# Patient Record
Sex: Female | Born: 1940 | Race: Black or African American | Hispanic: No | Marital: Married | Smoking: Never smoker
Health system: Southern US, Community
[De-identification: ages and names within clinical notes are randomized; demographics above are authoritative.]

## PROBLEM LIST (undated history)

## (undated) DIAGNOSIS — J40 Bronchitis, not specified as acute or chronic: Secondary | ICD-10-CM

## (undated) DIAGNOSIS — M24662 Ankylosis, left knee: Secondary | ICD-10-CM

## (undated) DIAGNOSIS — M1990 Unspecified osteoarthritis, unspecified site: Secondary | ICD-10-CM

## (undated) DIAGNOSIS — K219 Gastro-esophageal reflux disease without esophagitis: Secondary | ICD-10-CM

## (undated) DIAGNOSIS — R079 Chest pain, unspecified: Secondary | ICD-10-CM

## (undated) DIAGNOSIS — R42 Dizziness and giddiness: Secondary | ICD-10-CM

## (undated) DIAGNOSIS — D649 Anemia, unspecified: Secondary | ICD-10-CM

## (undated) DIAGNOSIS — Z8542 Personal history of malignant neoplasm of other parts of uterus: Secondary | ICD-10-CM

## (undated) DIAGNOSIS — I2510 Atherosclerotic heart disease of native coronary artery without angina pectoris: Secondary | ICD-10-CM

## (undated) DIAGNOSIS — I4891 Unspecified atrial fibrillation: Secondary | ICD-10-CM

## (undated) DIAGNOSIS — M797 Fibromyalgia: Secondary | ICD-10-CM

## (undated) DIAGNOSIS — E559 Vitamin D deficiency, unspecified: Secondary | ICD-10-CM

## (undated) DIAGNOSIS — H9190 Unspecified hearing loss, unspecified ear: Secondary | ICD-10-CM

## (undated) DIAGNOSIS — R001 Bradycardia, unspecified: Secondary | ICD-10-CM

## (undated) DIAGNOSIS — R200 Anesthesia of skin: Secondary | ICD-10-CM

## (undated) DIAGNOSIS — I10 Essential (primary) hypertension: Secondary | ICD-10-CM

## (undated) DIAGNOSIS — E785 Hyperlipidemia, unspecified: Secondary | ICD-10-CM

## (undated) DIAGNOSIS — J189 Pneumonia, unspecified organism: Secondary | ICD-10-CM

## (undated) DIAGNOSIS — R55 Syncope and collapse: Secondary | ICD-10-CM

## (undated) DIAGNOSIS — R32 Unspecified urinary incontinence: Secondary | ICD-10-CM

## (undated) DIAGNOSIS — R202 Paresthesia of skin: Secondary | ICD-10-CM

## (undated) DIAGNOSIS — N3941 Urge incontinence: Secondary | ICD-10-CM

## (undated) DIAGNOSIS — Z8719 Personal history of other diseases of the digestive system: Secondary | ICD-10-CM

## (undated) DIAGNOSIS — H9319 Tinnitus, unspecified ear: Secondary | ICD-10-CM

## (undated) DIAGNOSIS — IMO0001 Reserved for inherently not codable concepts without codable children: Secondary | ICD-10-CM

## (undated) HISTORY — PX: OTHER SURGICAL HISTORY: SHX169

## (undated) HISTORY — DX: Unspecified atrial fibrillation: I48.91

## (undated) HISTORY — DX: Atherosclerotic heart disease of native coronary artery without angina pectoris: I25.10

## (undated) HISTORY — PX: TYMPANOPLASTY: SHX33

## (undated) HISTORY — DX: Hyperlipidemia, unspecified: E78.5

---

## 1970-12-02 HISTORY — PX: TOTAL ABDOMINAL HYSTERECTOMY W/ BILATERAL SALPINGOOPHORECTOMY: SHX83

## 1973-12-02 DIAGNOSIS — Z8542 Personal history of malignant neoplasm of other parts of uterus: Secondary | ICD-10-CM

## 1973-12-02 HISTORY — DX: Personal history of malignant neoplasm of other parts of uterus: Z85.42

## 1998-08-23 ENCOUNTER — Ambulatory Visit (HOSPITAL_COMMUNITY): Admission: RE | Admit: 1998-08-23 | Discharge: 1998-08-23 | Payer: Self-pay | Admitting: Family Medicine

## 1999-05-08 ENCOUNTER — Other Ambulatory Visit: Admission: RE | Admit: 1999-05-08 | Discharge: 1999-05-08 | Payer: Self-pay | Admitting: Family Medicine

## 1999-08-14 ENCOUNTER — Ambulatory Visit (HOSPITAL_BASED_OUTPATIENT_CLINIC_OR_DEPARTMENT_OTHER): Admission: RE | Admit: 1999-08-14 | Discharge: 1999-08-14 | Payer: Self-pay | Admitting: Otolaryngology

## 1999-08-27 ENCOUNTER — Ambulatory Visit (HOSPITAL_COMMUNITY): Admission: RE | Admit: 1999-08-27 | Discharge: 1999-08-27 | Payer: Self-pay | Admitting: Family Medicine

## 1999-08-27 ENCOUNTER — Encounter: Payer: Self-pay | Admitting: Family Medicine

## 2000-04-10 ENCOUNTER — Other Ambulatory Visit: Admission: RE | Admit: 2000-04-10 | Discharge: 2000-04-10 | Payer: Self-pay | Admitting: Family Medicine

## 2000-08-26 ENCOUNTER — Ambulatory Visit (HOSPITAL_COMMUNITY): Admission: RE | Admit: 2000-08-26 | Discharge: 2000-08-26 | Payer: Self-pay | Admitting: Gastroenterology

## 2000-09-02 ENCOUNTER — Ambulatory Visit (HOSPITAL_COMMUNITY): Admission: RE | Admit: 2000-09-02 | Discharge: 2000-09-02 | Payer: Self-pay | Admitting: Gastroenterology

## 2000-09-02 ENCOUNTER — Encounter: Payer: Self-pay | Admitting: Gastroenterology

## 2000-09-16 ENCOUNTER — Ambulatory Visit (HOSPITAL_COMMUNITY): Admission: RE | Admit: 2000-09-16 | Discharge: 2000-09-16 | Payer: Self-pay | Admitting: Gastroenterology

## 2000-09-16 ENCOUNTER — Encounter: Payer: Self-pay | Admitting: Gastroenterology

## 2003-10-26 ENCOUNTER — Encounter: Payer: Self-pay | Admitting: Cardiology

## 2003-10-26 ENCOUNTER — Inpatient Hospital Stay (HOSPITAL_COMMUNITY): Admission: EM | Admit: 2003-10-26 | Discharge: 2003-10-27 | Payer: Self-pay | Admitting: Family Medicine

## 2004-11-17 ENCOUNTER — Emergency Department (HOSPITAL_COMMUNITY): Admission: EM | Admit: 2004-11-17 | Discharge: 2004-11-17 | Payer: Self-pay | Admitting: Emergency Medicine

## 2005-08-02 ENCOUNTER — Emergency Department (HOSPITAL_COMMUNITY): Admission: EM | Admit: 2005-08-02 | Discharge: 2005-08-02 | Payer: Self-pay | Admitting: Emergency Medicine

## 2005-08-28 ENCOUNTER — Emergency Department (HOSPITAL_COMMUNITY): Admission: EM | Admit: 2005-08-28 | Discharge: 2005-08-28 | Payer: Self-pay | Admitting: Emergency Medicine

## 2005-10-09 ENCOUNTER — Emergency Department (HOSPITAL_COMMUNITY): Admission: EM | Admit: 2005-10-09 | Discharge: 2005-10-09 | Payer: Self-pay | Admitting: Emergency Medicine

## 2005-12-12 ENCOUNTER — Inpatient Hospital Stay (HOSPITAL_COMMUNITY): Admission: RE | Admit: 2005-12-12 | Discharge: 2005-12-15 | Payer: Self-pay | Admitting: Neurosurgery

## 2006-03-15 ENCOUNTER — Emergency Department (HOSPITAL_COMMUNITY): Admission: EM | Admit: 2006-03-15 | Discharge: 2006-03-16 | Payer: Self-pay | Admitting: Emergency Medicine

## 2006-05-02 ENCOUNTER — Ambulatory Visit (HOSPITAL_COMMUNITY): Admission: RE | Admit: 2006-05-02 | Discharge: 2006-05-02 | Payer: Self-pay | Admitting: Family Medicine

## 2006-06-03 ENCOUNTER — Emergency Department (HOSPITAL_COMMUNITY): Admission: EM | Admit: 2006-06-03 | Discharge: 2006-06-03 | Payer: Self-pay | Admitting: Family Medicine

## 2007-02-13 ENCOUNTER — Emergency Department (HOSPITAL_COMMUNITY): Admission: EM | Admit: 2007-02-13 | Discharge: 2007-02-14 | Payer: Self-pay | Admitting: Emergency Medicine

## 2007-03-09 ENCOUNTER — Encounter: Admission: RE | Admit: 2007-03-09 | Discharge: 2007-03-25 | Payer: Self-pay | Admitting: Family Medicine

## 2009-06-20 ENCOUNTER — Other Ambulatory Visit: Admission: RE | Admit: 2009-06-20 | Discharge: 2009-06-20 | Payer: Self-pay | Admitting: Family Medicine

## 2009-12-02 HISTORY — PX: CERVICAL FUSION: SHX112

## 2010-12-22 ENCOUNTER — Encounter: Payer: Self-pay | Admitting: Family Medicine

## 2011-03-03 DEATH — deceased

## 2012-08-11 ENCOUNTER — Inpatient Hospital Stay (HOSPITAL_COMMUNITY): Admission: RE | Admit: 2012-08-11 | Payer: Medicare Other | Source: Ambulatory Visit | Admitting: Orthopedic Surgery

## 2012-08-11 ENCOUNTER — Encounter (HOSPITAL_COMMUNITY): Admission: RE | Payer: Self-pay | Source: Ambulatory Visit

## 2012-08-11 SURGERY — ARTHROPLASTY, KNEE, TOTAL
Anesthesia: Spinal | Laterality: Right

## 2012-12-21 ENCOUNTER — Other Ambulatory Visit (HOSPITAL_COMMUNITY): Payer: Self-pay | Admitting: Family Medicine

## 2012-12-21 DIAGNOSIS — Z1231 Encounter for screening mammogram for malignant neoplasm of breast: Secondary | ICD-10-CM

## 2013-01-22 ENCOUNTER — Ambulatory Visit (HOSPITAL_COMMUNITY)
Admission: RE | Admit: 2013-01-22 | Discharge: 2013-01-22 | Disposition: A | Discharge status: Home / Self Care | Payer: Medicare Other | Source: Ambulatory Visit | Attending: Family Medicine | Admitting: Family Medicine

## 2013-01-22 DIAGNOSIS — Z1231 Encounter for screening mammogram for malignant neoplasm of breast: Secondary | ICD-10-CM

## 2013-01-27 ENCOUNTER — Other Ambulatory Visit: Payer: Self-pay | Admitting: Family Medicine

## 2013-01-27 DIAGNOSIS — R1013 Epigastric pain: Secondary | ICD-10-CM

## 2013-01-27 DIAGNOSIS — R1310 Dysphagia, unspecified: Secondary | ICD-10-CM

## 2013-02-05 ENCOUNTER — Other Ambulatory Visit: Payer: Medicare Other

## 2013-07-14 NOTE — Progress Notes (Signed)
Need orders in EPIC. Surgery scheduled for 08/03/13.  Thank You.  

## 2013-07-20 ENCOUNTER — Encounter (HOSPITAL_COMMUNITY): Payer: Self-pay

## 2013-07-20 ENCOUNTER — Other Ambulatory Visit (HOSPITAL_COMMUNITY): Payer: Self-pay | Admitting: Orthopedic Surgery

## 2013-07-20 ENCOUNTER — Encounter (HOSPITAL_COMMUNITY): Payer: Self-pay | Admitting: Pharmacy Technician

## 2013-07-20 NOTE — Patient Instructions (Addendum)
20 Dezire L Atha  07/20/2013   Your procedure is scheduled on:  08/03/13  TUESDAY  Report to [facility] at   1235PM  Call this number if you have problems the morning of surgery: [phone]       Remember:   Do not eat food  :After Midnight.  Monday NIGHT--- MAY HAVE CLEAR LIQUIDS Tuesday MORNING UNTIL  0930AM, THEN NOTHING   Take these medicines the morning of surgery with A SIP OF WATER: AMLODIPINE,  ATENOLOL, OMEPRAZOLE   .  Contacts, dentures or partial plates can not be worn to surgery  Leave suitcase in the car. After surgery it may be brought to your room.  For patients admitted to the hospital, checkout time is 11:00 AM day of  discharge.             SPECIAL INSTRUCTIONS- SEE [place] PREPARING FOR SURGERY INSTRUCTION SHEET-     DO NOT WEAR JEWELRY, LOTIONS, POWDERS, OR PERFUMES.  WOMEN-- DO NOT SHAVE LEGS OR UNDERARMS FOR 12 HOURS BEFORE SHOWERS. MEN MAY SHAVE FACE.  Patients discharged the day of surgery will not be allowed to drive home. IF going home the day of surgery, you must have a driver and someone to stay with you for the first 24 hours  Name and phone number of your driver:  ADMISSION                                                                      Please read over the following fact sheets that you were given: MRSA Information, Incentive Spirometry Sheet, Blood Transfusion Sheet  Information                                                                                   Russell Quinney  PST [phone]  [phone]                 FAILURE TO FOLLOW THESE INSTRUCTIONS MAY RESULT IN  CANCELLATION   OF YOUR SURGERY                                                  Patient Signature _____________________________

## 2013-07-20 NOTE — Progress Notes (Signed)
EKG chart 8/14,OV with clearance Dr Katrinka Blazing chart

## 2013-07-21 ENCOUNTER — Encounter (HOSPITAL_COMMUNITY)
Admission: RE | Admit: 2013-07-21 | Discharge: 2013-07-21 | Disposition: A | Discharge status: Home / Self Care | Payer: Medicare Other | Source: Ambulatory Visit | Attending: Orthopedic Surgery | Admitting: Orthopedic Surgery

## 2013-07-21 ENCOUNTER — Ambulatory Visit (HOSPITAL_COMMUNITY)
Admission: RE | Admit: 2013-07-21 | Discharge: 2013-07-21 | Disposition: A | Discharge status: Home / Self Care | Payer: Medicare Other | Source: Ambulatory Visit | Attending: Orthopedic Surgery | Admitting: Orthopedic Surgery

## 2013-07-21 ENCOUNTER — Encounter (HOSPITAL_COMMUNITY): Payer: Self-pay

## 2013-07-21 DIAGNOSIS — Z01818 Encounter for other preprocedural examination: Secondary | ICD-10-CM | POA: Insufficient documentation

## 2013-07-21 DIAGNOSIS — Z01812 Encounter for preprocedural laboratory examination: Secondary | ICD-10-CM | POA: Insufficient documentation

## 2013-07-21 HISTORY — DX: Personal history of other diseases of the digestive system: Z87.19

## 2013-07-21 HISTORY — DX: Essential (primary) hypertension: I10

## 2013-07-21 HISTORY — DX: Unspecified osteoarthritis, unspecified site: M19.90

## 2013-07-21 HISTORY — DX: Gastro-esophageal reflux disease without esophagitis: K21.9

## 2013-07-21 HISTORY — DX: Pneumonia, unspecified organism: J18.9

## 2013-07-21 HISTORY — DX: Fibromyalgia: M79.7

## 2013-07-21 HISTORY — DX: Bradycardia, unspecified: R00.1

## 2013-07-21 LAB — SURGICAL PCR SCREEN
MRSA, PCR: NEGATIVE
Staphylococcus aureus: NEGATIVE

## 2013-07-21 LAB — CBC
HCT: 38.5 % (ref 36.0–46.0)
Hemoglobin: 13.1 g/dL (ref 12.0–15.0)
MCH: 31.3 pg (ref 26.0–34.0)
MCHC: 34 g/dL (ref 30.0–36.0)
MCV: 91.9 fL (ref 78.0–100.0)
Platelets: 278 10*3/uL (ref 150–400)
RBC: 4.19 MIL/uL (ref 3.87–5.11)
RDW: 12.3 % (ref 11.5–15.5)
WBC: 4 10*3/uL (ref 4.0–10.5)

## 2013-07-21 LAB — BASIC METABOLIC PANEL
BUN: 16 mg/dL (ref 6–23)
CO2: 28 mEq/L (ref 19–32)
Calcium: 9.7 mg/dL (ref 8.4–10.5)
Chloride: 101 mEq/L (ref 96–112)
Creatinine, Ser: 0.72 mg/dL (ref 0.50–1.10)
GFR calc Af Amer: >90 mL/min (ref >90)
GFR calc non Af Amer: 84 mL/min — ABNORMAL LOW (ref >90)
Glucose, Bld: 84 mg/dL (ref 70–99)
Potassium: 4.2 mEq/L (ref 3.5–5.1)
Sodium: 136 mEq/L (ref 135–145)

## 2013-07-21 LAB — URINE MICROSCOPIC-ADD ON

## 2013-07-21 LAB — APTT: aPTT: 32 seconds (ref 24–37)

## 2013-07-21 LAB — URINALYSIS, ROUTINE W REFLEX MICROSCOPIC
Bilirubin Urine: NEGATIVE
Glucose, UA: NEGATIVE mg/dL
Hgb urine dipstick: NEGATIVE
Ketones, ur: NEGATIVE mg/dL
Nitrite: NEGATIVE
Protein, ur: NEGATIVE mg/dL
Specific Gravity, Urine: 1.016 (ref 1.005–1.030)
Urobilinogen, UA: 0.2 mg/dL (ref 0.0–1.0)
pH: 5 (ref 5.0–8.0)

## 2013-07-21 LAB — PROTIME-INR
INR: 1.12 (ref 0.00–1.49)
Prothrombin Time: 14.2 seconds (ref 11.6–15.2)

## 2013-07-21 NOTE — Progress Notes (Signed)
Faxed urine with micro to Dr Charlann Boxer through [facility]

## 2013-07-22 LAB — URINE CULTURE
Colony Count: NO GROWTH
Culture: NO GROWTH

## 2013-07-23 NOTE — Progress Notes (Signed)
Received fax from Dr Boneta Lucks office stating Cipro was called in to [facility] on [address]

## 2013-07-23 NOTE — H&P (Signed)
TOTAL KNEE ADMISSION H&P  Patient is being admitted for left total knee arthroplasty.  Subjective:  Chief Complaint:  Left knee OA / pain.  HPI: Phyllis Knight, 72 y.o. female, has a history of pain and functional disability in the left knee due to arthritis and has failed non-surgical conservative treatments for greater than 12 weeks to includeNSAID's and/or analgesics, corticosteriod injections, use of assistive devices and activity modification.  Onset of symptoms was gradual, starting 3+ years ago with gradually worsening course since that time. The patient noted prior procedures on the knee to include  arthroscopy on the left knee(s).  Patient currently rates pain in the left knee(s) at 8 out of 10 with activity. Patient has night pain, worsening of pain with activity and weight bearing, pain that interferes with activities of daily living, pain with passive range of motion, crepitus and joint swelling.  Patient has evidence of periarticular osteophytes and joint space narrowing by imaging studies. There is no active infection.  Risks, benefits and expectations were discussed with the patient. Patient understand the risks, benefits and expectations and wishes to proceed with surgery.   D/C Plans:   Home with HHPT  Post-op Meds:    Rx given for ASA, Zanaflex, Iron, Colace and MiraLax  Tranexamic Acid:   To be given  Decadron:    To be given  FYI:    ASA post-op    Past Medical History  Diagnosis Date  . Arthritis   . Fibromyalgia   . Hypertension   . GERD (gastroesophageal reflux disease)   . H/O hiatal hernia   . Cancer     uterine  . Pneumonia   . Bradycardia   . Anxiety     regarding surgery    Past Surgical History  Procedure Laterality Date  . Abdominal hysterectomy    . Tracheotomy  1960  . Tympanoplasty Left     Allergies  Allergen Reactions  . Codeine Nausea And Vomiting    History  Substance Use Topics  . Smoking status: Never Smoker   . Smokeless  tobacco: Never Used  . Alcohol Use: No      Review of Systems  Constitutional: Positive for chills.  HENT: Positive for tinnitus.   Eyes: Negative.   Respiratory: Positive for cough and wheezing.   Cardiovascular: Negative.   Gastrointestinal: Positive for nausea, abdominal pain and constipation.  Genitourinary: Negative.   Musculoskeletal: Positive for back pain and joint pain.  Skin: Negative.   Neurological: Positive for headaches.  Endo/Heme/Allergies: Negative.   Psychiatric/Behavioral: The patient is nervous/anxious.     Objective:  Physical Exam  Constitutional: She is oriented to person, place, and time. She appears well-developed and well-nourished.  HENT:  Head: Normocephalic and atraumatic.  Mouth/Throat: Oropharynx is clear and moist.  Eyes: Pupils are equal, round, and reactive to light.  Neck: Neck supple. No JVD present. No tracheal deviation present. No thyromegaly present.  Cardiovascular: Normal rate, regular rhythm, normal heart sounds and intact distal pulses.   Respiratory: Effort normal and breath sounds normal. No stridor. No respiratory distress. She has no wheezes.  GI: Soft. There is no tenderness. There is no guarding.  Musculoskeletal:       Left knee: She exhibits decreased range of motion, swelling, abnormal alignment (valgus) and bony tenderness. She exhibits no effusion, no ecchymosis and no deformity. Tenderness found.  Lymphadenopathy:    She has no cervical adenopathy.  Neurological: She is alert and oriented to person, place, and time.  A sensory deficit (neuropathy in both feet at times.) is present.  Skin: Skin is warm and dry.  Psychiatric: She has a normal mood and affect.     Imaging Review Plain radiographs demonstrate severe degenerative joint disease of the left knee(s). The overall alignment is  mild valgus. The bone quality appears to be good for age and reported activity level.  Assessment/Plan:  End stage arthritis, left  knee   The patient history, physical examination, clinical judgment of the provider and imaging studies are consistent with end stage degenerative joint disease of the left knee(s) and total knee arthroplasty is deemed medically necessary. The treatment options including medical management, injection therapy arthroscopy and arthroplasty were discussed at length. The risks and benefits of total knee arthroplasty were presented and reviewed. The risks due to aseptic loosening, infection, stiffness, patella tracking problems, thromboembolic complications and other imponderables were discussed. The patient acknowledged the explanation, agreed to proceed with the plan and consent was signed. Patient is being admitted for inpatient treatment for surgery, pain control, PT, OT, prophylactic antibiotics, VTE prophylaxis, progressive ambulation and ADL's and discharge planning. The patient is planning to be discharged home with home health services.    Anastasio Auerbach Mikiah Demond   PAC  07/23/2013, 11:45 AM

## 2013-07-30 NOTE — Progress Notes (Signed)
Notified patient and husband  of time change- to arrive Short Stay  Tuesday at 1215 PM-  May have clear liquids Tues am until 0815 AM then nothing by mouth

## 2013-08-03 ENCOUNTER — Encounter (HOSPITAL_COMMUNITY): Payer: Self-pay | Admitting: *Deleted

## 2013-08-03 ENCOUNTER — Inpatient Hospital Stay (HOSPITAL_COMMUNITY)
Admission: RE | Admit: 2013-08-03 | Discharge: 2013-08-05 | DRG: 470 | Disposition: A | Discharge status: Home Health | Payer: Medicare Other | Source: Ambulatory Visit | Attending: Orthopedic Surgery | Admitting: Orthopedic Surgery

## 2013-08-03 ENCOUNTER — Encounter (HOSPITAL_COMMUNITY): Payer: Self-pay | Admitting: Anesthesiology

## 2013-08-03 ENCOUNTER — Encounter (HOSPITAL_COMMUNITY)
Admission: RE | Disposition: A | Discharge status: Home Health | Payer: Self-pay | Source: Ambulatory Visit | Attending: Orthopedic Surgery

## 2013-08-03 ENCOUNTER — Inpatient Hospital Stay (HOSPITAL_COMMUNITY): Payer: Medicare Other | Admitting: Anesthesiology

## 2013-08-03 DIAGNOSIS — M6580 Other synovitis and tenosynovitis, unspecified site: Secondary | ICD-10-CM | POA: Diagnosis present

## 2013-08-03 DIAGNOSIS — M1710 Unilateral primary osteoarthritis, unspecified knee: Principal | ICD-10-CM | POA: Diagnosis present

## 2013-08-03 DIAGNOSIS — K219 Gastro-esophageal reflux disease without esophagitis: Secondary | ICD-10-CM | POA: Diagnosis present

## 2013-08-03 DIAGNOSIS — Z96652 Presence of left artificial knee joint: Secondary | ICD-10-CM

## 2013-08-03 DIAGNOSIS — E669 Obesity, unspecified: Secondary | ICD-10-CM | POA: Diagnosis present

## 2013-08-03 DIAGNOSIS — Z6835 Body mass index (BMI) 35.0-35.9, adult: Secondary | ICD-10-CM

## 2013-08-03 DIAGNOSIS — Z96659 Presence of unspecified artificial knee joint: Secondary | ICD-10-CM

## 2013-08-03 DIAGNOSIS — I10 Essential (primary) hypertension: Secondary | ICD-10-CM | POA: Diagnosis present

## 2013-08-03 DIAGNOSIS — D500 Iron deficiency anemia secondary to blood loss (chronic): Secondary | ICD-10-CM | POA: Diagnosis not present

## 2013-08-03 DIAGNOSIS — D62 Acute posthemorrhagic anemia: Secondary | ICD-10-CM | POA: Diagnosis not present

## 2013-08-03 DIAGNOSIS — IMO0001 Reserved for inherently not codable concepts without codable children: Secondary | ICD-10-CM | POA: Diagnosis present

## 2013-08-03 HISTORY — PX: TOTAL KNEE ARTHROPLASTY: SHX125

## 2013-08-03 LAB — TYPE AND SCREEN
ABO/RH(D): O POS
Antibody Screen: NEGATIVE

## 2013-08-03 LAB — ABO/RH: ABO/RH(D): O POS

## 2013-08-03 SURGERY — ARTHROPLASTY, KNEE, TOTAL
Anesthesia: Spinal | Site: Knee | Laterality: Left | Wound class: Clean

## 2013-08-03 MED ORDER — CEFAZOLIN SODIUM-DEXTROSE 2-3 GM-% IV SOLR
2.0000 g | INTRAVENOUS | Status: AC
  Administered 2013-08-03: 2 g via INTRAVENOUS

## 2013-08-03 MED ORDER — METOCLOPRAMIDE HCL 5 MG/ML IJ SOLN: 5.0000 mg | Freq: Three times a day (TID) | INTRAMUSCULAR | Status: DC | PRN

## 2013-08-03 MED ORDER — KETOROLAC TROMETHAMINE 30 MG/ML IJ SOLN
INTRAMUSCULAR | Status: DC | PRN
  Administered 2013-08-03: 30 mg via INTRAMUSCULAR

## 2013-08-03 MED ORDER — TRAMADOL HCL 50 MG PO TABS
50.0000 mg | ORAL_TABLET | Freq: Four times a day (QID) | ORAL | Status: DC
  Administered 2013-08-03: 50 mg via ORAL
  Administered 2013-08-04: 100 mg via ORAL
  Administered 2013-08-04: 50 mg via ORAL
  Administered 2013-08-04 – 2013-08-05 (×4): 100 mg via ORAL
  Filled 2013-08-03 (×2): qty 2
  Filled 2013-08-03: qty 1
  Filled 2013-08-03 (×2): qty 2
  Filled 2013-08-03: qty 1
  Filled 2013-08-03: qty 2

## 2013-08-03 MED ORDER — CELECOXIB 200 MG PO CAPS
200.0000 mg | ORAL_CAPSULE | Freq: Two times a day (BID) | ORAL | Status: DC
  Administered 2013-08-03 – 2013-08-05 (×4): 200 mg via ORAL
  Filled 2013-08-03 (×5): qty 1

## 2013-08-03 MED ORDER — ASPIRIN EC 325 MG PO TBEC
325.0000 mg | DELAYED_RELEASE_TABLET | Freq: Two times a day (BID) | ORAL | Status: DC
  Administered 2013-08-04 – 2013-08-05 (×3): 325 mg via ORAL
  Filled 2013-08-03 (×5): qty 1

## 2013-08-03 MED ORDER — ZOLPIDEM TARTRATE 5 MG PO TABS: 5.0000 mg | ORAL_TABLET | Freq: Every evening | ORAL | Status: DC | PRN

## 2013-08-03 MED ORDER — HYDROMORPHONE HCL PF 1 MG/ML IJ SOLN
0.5000 mg | INTRAMUSCULAR | Status: DC | PRN
  Administered 2013-08-03 – 2013-08-04 (×3): 1 mg via INTRAVENOUS
  Filled 2013-08-03 (×3): qty 1

## 2013-08-03 MED ORDER — FESOTERODINE FUMARATE ER 8 MG PO TB24
8.0000 mg | ORAL_TABLET | Freq: Every day | ORAL | Status: DC
  Administered 2013-08-04 – 2013-08-05 (×2): 8 mg via ORAL
  Filled 2013-08-03 (×4): qty 1

## 2013-08-03 MED ORDER — DEXAMETHASONE SODIUM PHOSPHATE 10 MG/ML IJ SOLN
10.0000 mg | Freq: Once | INTRAMUSCULAR | Status: AC
  Administered 2013-08-04: 10 mg via INTRAVENOUS
  Filled 2013-08-03: qty 1

## 2013-08-03 MED ORDER — MIDAZOLAM HCL 5 MG/5ML IJ SOLN
INTRAMUSCULAR | Status: DC | PRN
  Administered 2013-08-03: 2 mg via INTRAVENOUS

## 2013-08-03 MED ORDER — AMLODIPINE BESYLATE 10 MG PO TABS
10.0000 mg | ORAL_TABLET | Freq: Every morning | ORAL | Status: DC
  Administered 2013-08-04 – 2013-08-05 (×2): 10 mg via ORAL
  Filled 2013-08-03 (×2): qty 1

## 2013-08-03 MED ORDER — BISACODYL 10 MG RE SUPP: 10.0000 mg | Freq: Every day | RECTAL | Status: DC | PRN

## 2013-08-03 MED ORDER — POLYETHYLENE GLYCOL 3350 17 G PO PACK
17.0000 g | PACK | Freq: Two times a day (BID) | ORAL | Status: DC
  Administered 2013-08-03 – 2013-08-05 (×4): 17 g via ORAL

## 2013-08-03 MED ORDER — MEPERIDINE HCL 50 MG/ML IJ SOLN: 6.2500 mg | INTRAMUSCULAR | Status: DC | PRN

## 2013-08-03 MED ORDER — PROPOFOL INFUSION 10 MG/ML OPTIME
INTRAVENOUS | Status: DC | PRN
  Administered 2013-08-03: 75 ug/kg/min via INTRAVENOUS

## 2013-08-03 MED ORDER — FLEET ENEMA 7-19 GM/118ML RE ENEM: 1.0000 | ENEMA | Freq: Once | RECTAL | Status: AC | PRN

## 2013-08-03 MED ORDER — SODIUM CHLORIDE 0.9 % IV SOLN
INTRAVENOUS | Status: DC
  Administered 2013-08-03: 22:00:00 via INTRAVENOUS
  Filled 2013-08-03 (×6): qty 1000

## 2013-08-03 MED ORDER — PANTOPRAZOLE SODIUM 40 MG PO TBEC
40.0000 mg | DELAYED_RELEASE_TABLET | Freq: Every day | ORAL | Status: DC
  Administered 2013-08-04 – 2013-08-05 (×2): 40 mg via ORAL
  Filled 2013-08-03 (×2): qty 1

## 2013-08-03 MED ORDER — DIPHENHYDRAMINE HCL 25 MG PO CAPS: 25.0000 mg | ORAL_CAPSULE | Freq: Four times a day (QID) | ORAL | Status: DC | PRN

## 2013-08-03 MED ORDER — FENTANYL CITRATE 0.05 MG/ML IJ SOLN
INTRAMUSCULAR | Status: DC | PRN
  Administered 2013-08-03 (×2): 25 ug via INTRAVENOUS

## 2013-08-03 MED ORDER — PHENOL 1.4 % MT LIQD: 1.0000 | OROMUCOSAL | Status: DC | PRN

## 2013-08-03 MED ORDER — TRANEXAMIC ACID 100 MG/ML IV SOLN
1000.0000 mg | Freq: Once | INTRAVENOUS | Status: AC
  Administered 2013-08-03: 1000 mg via INTRAVENOUS
  Filled 2013-08-03: qty 10

## 2013-08-03 MED ORDER — PROMETHAZINE HCL 25 MG/ML IJ SOLN: 6.2500 mg | INTRAMUSCULAR | Status: DC | PRN

## 2013-08-03 MED ORDER — FERROUS SULFATE 325 (65 FE) MG PO TABS
325.0000 mg | ORAL_TABLET | Freq: Three times a day (TID) | ORAL | Status: DC
  Administered 2013-08-04 – 2013-08-05 (×4): 325 mg via ORAL
  Filled 2013-08-03 (×7): qty 1

## 2013-08-03 MED ORDER — KETAMINE HCL 10 MG/ML IJ SOLN
INTRAMUSCULAR | Status: DC | PRN
  Administered 2013-08-03: 10 mg via INTRAVENOUS

## 2013-08-03 MED ORDER — LACTATED RINGERS IV SOLN
INTRAVENOUS | Status: DC
  Administered 2013-08-03: 16:00:00 via INTRAVENOUS
  Administered 2013-08-03: 1000 mL via INTRAVENOUS

## 2013-08-03 MED ORDER — ONDANSETRON HCL 4 MG PO TABS: 4.0000 mg | ORAL_TABLET | Freq: Four times a day (QID) | ORAL | Status: DC | PRN

## 2013-08-03 MED ORDER — MENTHOL 3 MG MT LOZG: 1.0000 | LOZENGE | OROMUCOSAL | Status: DC | PRN

## 2013-08-03 MED ORDER — ALUM & MAG HYDROXIDE-SIMETH 200-200-20 MG/5ML PO SUSP: 30.0000 mL | ORAL | Status: DC | PRN

## 2013-08-03 MED ORDER — METHOCARBAMOL 500 MG PO TABS
500.0000 mg | ORAL_TABLET | Freq: Four times a day (QID) | ORAL | Status: DC | PRN
  Administered 2013-08-04 (×2): 500 mg via ORAL
  Filled 2013-08-03 (×2): qty 1

## 2013-08-03 MED ORDER — ONDANSETRON HCL 4 MG/2ML IJ SOLN
4.0000 mg | Freq: Four times a day (QID) | INTRAMUSCULAR | Status: DC | PRN
  Administered 2013-08-04: 4 mg via INTRAVENOUS
  Filled 2013-08-03: qty 2

## 2013-08-03 MED ORDER — SODIUM CHLORIDE 0.9 % IR SOLN
Status: DC | PRN
  Administered 2013-08-03: 1000 mL

## 2013-08-03 MED ORDER — HYDROMORPHONE HCL PF 1 MG/ML IJ SOLN
0.2500 mg | INTRAMUSCULAR | Status: DC | PRN
  Administered 2013-08-03 (×2): 0.5 mg via INTRAVENOUS

## 2013-08-03 MED ORDER — HYDROMORPHONE HCL PF 1 MG/ML IJ SOLN
INTRAMUSCULAR | Status: AC
  Filled 2013-08-03: qty 1

## 2013-08-03 MED ORDER — 0.9 % SODIUM CHLORIDE (POUR BTL) OPTIME
TOPICAL | Status: DC | PRN
  Administered 2013-08-03: 1000 mL

## 2013-08-03 MED ORDER — BUPIVACAINE IN DEXTROSE 0.75-8.25 % IT SOLN
INTRATHECAL | Status: DC | PRN
  Administered 2013-08-03: 2 mL via INTRATHECAL

## 2013-08-03 MED ORDER — DEXAMETHASONE SODIUM PHOSPHATE 10 MG/ML IJ SOLN
10.0000 mg | Freq: Once | INTRAMUSCULAR | Status: AC
  Administered 2013-08-03: 10 mg via INTRAVENOUS

## 2013-08-03 MED ORDER — ATENOLOL 12.5 MG HALF TABLET
12.5000 mg | ORAL_TABLET | Freq: Every morning | ORAL | Status: DC
  Administered 2013-08-04 – 2013-08-05 (×2): 12.5 mg via ORAL
  Filled 2013-08-03 (×2): qty 1

## 2013-08-03 MED ORDER — EPHEDRINE SULFATE 50 MG/ML IJ SOLN
INTRAMUSCULAR | Status: DC | PRN
  Administered 2013-08-03: 5 mg via INTRAVENOUS
  Administered 2013-08-03: 10 mg via INTRAVENOUS

## 2013-08-03 MED ORDER — SODIUM CHLORIDE 0.9 % IJ SOLN
INTRAMUSCULAR | Status: DC | PRN
  Administered 2013-08-03: 16:00:00

## 2013-08-03 MED ORDER — METHOCARBAMOL 100 MG/ML IJ SOLN
500.0000 mg | Freq: Four times a day (QID) | INTRAVENOUS | Status: DC | PRN
  Administered 2013-08-03: 500 mg via INTRAVENOUS
  Filled 2013-08-03 (×2): qty 5

## 2013-08-03 MED ORDER — BUPIVACAINE LIPOSOME 1.3 % IJ SUSP
20.0000 mL | Freq: Once | INTRAMUSCULAR | Status: DC
  Filled 2013-08-03: qty 20

## 2013-08-03 MED ORDER — ONDANSETRON HCL 4 MG/2ML IJ SOLN
INTRAMUSCULAR | Status: DC | PRN
  Administered 2013-08-03 (×2): 2 mg via INTRAVENOUS

## 2013-08-03 MED ORDER — METOCLOPRAMIDE HCL 10 MG PO TABS: 5.0000 mg | ORAL_TABLET | Freq: Three times a day (TID) | ORAL | Status: DC | PRN

## 2013-08-03 MED ORDER — CEFAZOLIN SODIUM-DEXTROSE 2-3 GM-% IV SOLR
2.0000 g | Freq: Four times a day (QID) | INTRAVENOUS | Status: AC
  Administered 2013-08-03 – 2013-08-04 (×2): 2 g via INTRAVENOUS
  Filled 2013-08-03 (×2): qty 50

## 2013-08-03 MED ORDER — BUPIVACAINE-EPINEPHRINE PF 0.25-1:200000 % IJ SOLN
INTRAMUSCULAR | Status: DC | PRN
  Administered 2013-08-03: 25 mL

## 2013-08-03 MED ORDER — DOCUSATE SODIUM 100 MG PO CAPS
100.0000 mg | ORAL_CAPSULE | Freq: Two times a day (BID) | ORAL | Status: DC
  Administered 2013-08-03 – 2013-08-05 (×4): 100 mg via ORAL

## 2013-08-03 SURGICAL SUPPLY — 58 items
ADH SKN CLS APL DERMABOND .7 (GAUZE/BANDAGES/DRESSINGS) ×1
BAG SPEC THK2 15X12 ZIP CLS (MISCELLANEOUS) ×1
BAG ZIPLOCK 12X15 (MISCELLANEOUS) ×2 IMPLANT
BANDAGE ELASTIC 6 VELCRO ST LF (GAUZE/BANDAGES/DRESSINGS) ×2 IMPLANT
BANDAGE ESMARK 6X9 LF (GAUZE/BANDAGES/DRESSINGS) ×1 IMPLANT
BLADE SAW SGTL 13.0X1.19X90.0M (BLADE) ×2 IMPLANT
BNDG CMPR 9X6 STRL LF SNTH (GAUZE/BANDAGES/DRESSINGS) ×1
BNDG ESMARK 6X9 LF (GAUZE/BANDAGES/DRESSINGS) ×2
BOWL SMART MIX CTS (DISPOSABLE) ×2 IMPLANT
CAPT RP KNEE ×2 IMPLANT
CEMENT HV SMART SET (Cement) ×4 IMPLANT
CLOTH BEACON ORANGE TIMEOUT ST (SAFETY) ×2 IMPLANT
CUFF TOURN SGL QUICK 34 (TOURNIQUET CUFF) ×2
CUFF TRNQT CYL 34X4X40X1 (TOURNIQUET CUFF) ×1 IMPLANT
DECANTER SPIKE VIAL GLASS SM (MISCELLANEOUS) ×2 IMPLANT
DERMABOND ADVANCED (GAUZE/BANDAGES/DRESSINGS) ×1
DERMABOND ADVANCED .7 DNX12 (GAUZE/BANDAGES/DRESSINGS) ×1 IMPLANT
DRAPE EXTREMITY T 121X128X90 (DRAPE) ×2 IMPLANT
DRAPE POUCH INSTRU U-SHP 10X18 (DRAPES) ×2 IMPLANT
DRAPE U-SHAPE 47X51 STRL (DRAPES) ×2 IMPLANT
DRSG AQUACEL AG ADV 3.5X10 (GAUZE/BANDAGES/DRESSINGS) ×2 IMPLANT
DRSG TEGADERM 4X4.75 (GAUZE/BANDAGES/DRESSINGS) ×2 IMPLANT
DURAPREP 26ML APPLICATOR (WOUND CARE) ×4 IMPLANT
ELECT REM PT RETURN 9FT ADLT (ELECTROSURGICAL) ×2
ELECTRODE REM PT RTRN 9FT ADLT (ELECTROSURGICAL) ×1 IMPLANT
EVACUATOR 1/8 PVC DRAIN (DRAIN) ×2 IMPLANT
FACESHIELD LNG OPTICON STERILE (SAFETY) ×10 IMPLANT
GAUZE SPONGE 2X2 8PLY STRL LF (GAUZE/BANDAGES/DRESSINGS) ×1 IMPLANT
GLOVE BIOGEL PI IND STRL 7.5 (GLOVE) ×1 IMPLANT
GLOVE BIOGEL PI IND STRL 8 (GLOVE) ×1 IMPLANT
GLOVE BIOGEL PI INDICATOR 7.5 (GLOVE) ×1
GLOVE BIOGEL PI INDICATOR 8 (GLOVE) ×1
GLOVE ECLIPSE 8.0 STRL XLNG CF (GLOVE) ×2 IMPLANT
GLOVE ORTHO TXT STRL SZ7.5 (GLOVE) ×4 IMPLANT
GOWN BRE IMP PREV XXLGXLNG (GOWN DISPOSABLE) ×4 IMPLANT
GOWN STRL NON-REIN LRG LVL3 (GOWN DISPOSABLE) ×4 IMPLANT
HANDPIECE INTERPULSE COAX TIP (DISPOSABLE) ×2
KIT BASIN OR (CUSTOM PROCEDURE TRAY) ×2 IMPLANT
MANIFOLD NEPTUNE II (INSTRUMENTS) ×2 IMPLANT
NDL SAFETY ECLIPSE 18X1.5 (NEEDLE) ×1 IMPLANT
NEEDLE HYPO 18GX1.5 SHARP (NEEDLE) ×2
NS IRRIG 1000ML POUR BTL (IV SOLUTION) ×4 IMPLANT
PACK TOTAL JOINT (CUSTOM PROCEDURE TRAY) ×2 IMPLANT
POSITIONER SURGICAL ARM (MISCELLANEOUS) ×2 IMPLANT
SET HNDPC FAN SPRY TIP SCT (DISPOSABLE) ×1 IMPLANT
SET PAD KNEE POSITIONER (MISCELLANEOUS) ×2 IMPLANT
SPONGE GAUZE 2X2 STER 10/PKG (GAUZE/BANDAGES/DRESSINGS) ×1
SUCTION FRAZIER 12FR DISP (SUCTIONS) ×2 IMPLANT
SUT MNCRL AB 4-0 PS2 18 (SUTURE) ×2 IMPLANT
SUT VIC AB 1 CT1 36 (SUTURE) ×2 IMPLANT
SUT VIC AB 2-0 CT1 27 (SUTURE) ×6
SUT VIC AB 2-0 CT1 TAPERPNT 27 (SUTURE) ×3 IMPLANT
SUT VLOC 180 0 24IN GS25 (SUTURE) ×2 IMPLANT
SYR 50ML LL SCALE MARK (SYRINGE) ×2 IMPLANT
TOWEL OR 17X26 10 PK STRL BLUE (TOWEL DISPOSABLE) ×4 IMPLANT
TRAY FOLEY CATH 14FRSI W/METER (CATHETERS) ×2 IMPLANT
WATER STERILE IRR 1500ML POUR (IV SOLUTION) ×2 IMPLANT
WRAP KNEE MAXI GEL POST OP (GAUZE/BANDAGES/DRESSINGS) ×2 IMPLANT

## 2013-08-03 NOTE — Transfer of Care (Signed)
Immediate Anesthesia Transfer of Care Note  Patient: Phyllis Knight  Procedure(s) Performed: Procedure(s): LEFT TOTAL KNEE ARTHROPLASTY (Left)  Patient Location: PACU  Anesthesia Type:Regional  Level of Consciousness: awake, alert , oriented and patient cooperative  Airway & Oxygen Therapy: Patient Spontanous Breathing and Patient connected to face mask oxygen  Post-op Assessment: Report given to PACU RN and Post -op Vital signs reviewed and stable  Post vital signs: stable  Complications: No apparent anesthesia complications

## 2013-08-03 NOTE — Anesthesia Preprocedure Evaluation (Addendum)
Anesthesia Evaluation  Patient identified by MRN, date of birth, ID band Patient awake    Reviewed: Allergy & Precautions, H&P , NPO status , Patient's Chart, lab work & pertinent test results, reviewed documented beta blocker date and time   Airway       Dental  (+) Dental Advisory Given   Pulmonary pneumonia -,          Cardiovascular hypertension, Pt. on medications and Pt. on home beta blockers     Neuro/Psych PSYCHIATRIC DISORDERS Anxiety negative neurological ROS     GI/Hepatic Neg liver ROS, hiatal hernia, GERD-  Medicated,  Endo/Other  negative endocrine ROS  Renal/GU negative Renal ROS     Musculoskeletal  (+) Fibromyalgia -  Abdominal (+) + obese,   Peds  Hematology negative hematology ROS (+)   Anesthesia Other Findings   Reproductive/Obstetrics negative OB ROS                          Anesthesia Physical Anesthesia Plan  ASA: II  Anesthesia Plan: Spinal   Post-op Pain Management:    Induction:   Airway Management Planned:   Additional Equipment:   Intra-op Plan:   Post-operative Plan:   Informed Consent: I have reviewed the patients History and Physical, chart, labs and discussed the procedure including the risks, benefits and alternatives for the proposed anesthesia with the patient or authorized representative who has indicated his/her understanding and acceptance.   Dental advisory given  Plan Discussed with: CRNA  Anesthesia Plan Comments:         Anesthesia Quick Evaluation

## 2013-08-03 NOTE — Anesthesia Postprocedure Evaluation (Signed)
  Anesthesia Post-op Note  Patient: Phyllis Knight  Procedure(s) Performed: Procedure(s) (LRB): LEFT TOTAL KNEE ARTHROPLASTY (Left)  Patient Location: PACU  Anesthesia Type: Spinal  Level of Consciousness: awake and alert   Airway and Oxygen Therapy: Patient Spontanous Breathing  Post-op Pain: mild  Post-op Assessment: Post-op Vital signs reviewed, Patient's Cardiovascular Status Stable, Respiratory Function Stable, Patent Airway and No signs of Nausea or vomiting  Last Vitals:  Filed Vitals:   08/03/13 1815  BP: 128/74  Pulse: 59  Temp:   Resp: 14    Post-op Vital Signs: stable   Complications: No apparent anesthesia complications

## 2013-08-03 NOTE — Interval H&P Note (Signed)
History and Physical Interval Note:  08/03/2013 1:21 PM  Phyllis Knight  has presented today for surgery, with the diagnosis of LEFT KNEE OA  The various methods of treatment have been discussed with the patient and family. After consideration of risks, benefits and other options for treatment, the patient has consented to  Procedure(s): LEFT TOTAL KNEE ARTHROPLASTY (Left) as a surgical intervention .  The patient's history has been reviewed, patient examined, no change in status, stable for surgery.  I have reviewed the patient's chart and labs.  Questions were answered to the patient's satisfaction.     Shelda Pal

## 2013-08-03 NOTE — Op Note (Signed)
NAME:  SHAUN ZUCCARO                      MEDICAL RECORD NO.:  [id number]                             FACILITY:  [facility]      PHYSICIAN:  Madlyn Frankel. Charlann Boxer, M.D.  DATE OF BIRTH:  27-Jun-1941      DATE OF PROCEDURE:  08/03/2013                                     OPERATIVE REPORT         PREOPERATIVE DIAGNOSIS:  Left knee osteoarthritis.      POSTOPERATIVE DIAGNOSIS:  Left knee osteoarthritis.      FINDINGS:  The patient was noted to have complete loss of cartilage and   bone-on-bone arthritis with associated osteophytes in the lateral and patellofemoral compartments of   the knee with a significant synovitis and associated effusion.      PROCEDURE:  Left total knee replacement.      COMPONENTS USED:  DePuy rotating platform posterior stabilized knee   system, a size 2.5 femur, 3 tibia, 12.5 mm PS insert, and 38 patellar   button.      SURGEON:  Madlyn Frankel. Charlann Boxer, M.D.      ASSISTANT:  Lanney Gins, PA-C.      ANESTHESIA:  Spinal.      SPECIMENS:  None.      COMPLICATION:  None.      DRAINS:  One Hemovac.  EBL: <150cc      TOURNIQUET TIME:   Total Tourniquet Time Documented: Thigh (Left) - 34 minutes Total: Thigh (Left) - 34 minutes  .      The patient was stable to the recovery room.      INDICATION FOR PROCEDURE:  JAIDENCE GEISLER is a 72 y.o. female patient of   mine.  The patient had been seen, evaluated, and treated conservatively in the   office with medication, activity modification, and injections.  The patient had   radiographic changes of bone-on-bone arthritis with endplate sclerosis and osteophytes noted.      The patient failed conservative measures including medication, injections, and activity modification, and at this point was ready for more definitive measures.   Based on the radiographic changes and failed conservative measures, the patient   decided to proceed with total knee replacement.  Risks of infection,   DVT, component failure, need for  revision surgery, postop course, and   expectations were all   discussed and reviewed.  Consent was obtained for benefit of pain   relief.      PROCEDURE IN DETAIL:  The patient was brought to the operative theater.   Once adequate anesthesia, preoperative antibiotics, 900mg  of Clindamycin administered, the patient was positioned supine with the left thigh tourniquet placed.  The  left lower extremity was prepped and draped in sterile fashion.  A time-   out was performed identifying the patient, planned procedure, and   extremity.      The left lower extremity was placed in the [facility] leg holder.  The leg was   exsanguinated, tourniquet elevated to 250 mmHg.  A midline incision was   made followed by median parapatellar arthrotomy.  Following initial   exposure, attention was first  directed to the patella.  Precut   measurement was noted to be 22 mm.  I resected down to 14 mm and used a   38 patellar button to restore patellar height as well as cover the cut   surface.      The lug holes were drilled and a metal shim was placed to protect the   patella from retractors and saw blades.      At this point, attention was now directed to the femur.  The femoral   canal was opened with a drill, irrigated to try to prevent fat emboli.  An   intramedullary rod was passed at 5 degrees valgus, 10 mm of bone was   resected off the distal femur.  Following this resection, the tibia was   subluxated anteriorly.  Using the extramedullary guide, 4 mm of bone was resected off   the proximal lateral tibia.  We confirmed the gap would be   stable medially and laterally with a 10 mm insert as well as confirmed   the cut was perpendicular in the coronal plane, checking with an alignment rod.      Once this was done, I sized the femur to be a size 2.5 in the anterior-   posterior dimension, chose a standard component based on medial and   lateral dimension.  The size 2.5 rotation block was then pinned  in   position anterior referenced using the C-clamp to set rotation.  The   anterior, posterior, and  chamfer cuts were made without difficulty nor   notching making certain that I was along the anterior cortex to help   with flexion gap stability.      The final box cut was made off the lateral aspect of distal femur.      At this point, the tibia was sized to be a size 3, the size 3 tray was   then pinned in position through the medial third of the tubercle,   drilled, and keel punched.  Trial reduction was now carried with a 2.5 femur,  3 tibia, a 12.5 mm insert, and the 38 patella botton.  The knee was brought to   extension, full extension with good flexion stability with the patella   tracking through the trochlea without application of pressure.  Given   all these findings, the trial components removed.  Final components were   opened and cement was mixed.  The knee was irrigated with normal saline   solution and pulse lavage.  The synovial lining was   then injected with 0.25% Marcaine with epinephrine and 1 cc of Toradol,   total of 61 cc.      The knee was irrigated.  Final implants were then cemented onto clean and   dried cut surfaces of bone with the knee brought to extension with a 12.5 mm trial insert.      Once the cement had fully cured, the excess cement was removed   throughout the knee.  I confirmed I was satisfied with the range of   motion and stability, and the final 12.5 mm PS insert was chosen.  It was   placed into the knee.      The tourniquet had been let down at 34 minutes.  No significant   hemostasis required.  The medium Hemovac drain was placed deep.  The   extensor mechanism was then reapproximated using #1 Vicryl with the knee   in flexion.  The  remaining wound was closed with 2-0 Vicryl and running 4-0 Monocryl.   The knee was cleaned, dried, dressed sterilely using Dermabond and   Aquacel dressing.  Drain site dressed separately.  The patient  was then   brought to recovery room in stable condition, tolerating the procedure   well.   Please note that Physician Assistant, Lanney Gins, was present for the entirety of the case, and was utilized for pre-operative positioning, peri-operative retractor management, general facilitation of the procedure.  He was also utilized for primary wound closure at the end of the case.              Madlyn Frankel Charlann Boxer, M.D.

## 2013-08-03 NOTE — Anesthesia Procedure Notes (Signed)
Spinal  Patient location during procedure: OR Start time: 08/03/2013 2:38 PM End time: 08/03/2013 2:45 PM Staffing Anesthesiologist: Gaylan Gerold CRNA/Resident: Anastasio Champion E Performed by: anesthesiologist  Preanesthetic Checklist Completed: patient identified, site marked, surgical consent, pre-op evaluation, timeout performed, IV checked, risks and benefits discussed and monitors and equipment checked Spinal Block Patient position: sitting Prep: Betadine Patient monitoring: heart rate, continuous pulse ox and blood pressure Approach: midline Location: L2-3 Injection technique: single-shot Needle Needle type: Quincke  Needle gauge: 22 G Needle length: 9 cm Assessment Sensory level: T8 Additional Notes Expiration date of kit checked and confirmed. Patient tolerated procedure well, without complications.

## 2013-08-04 ENCOUNTER — Encounter (HOSPITAL_COMMUNITY): Payer: Self-pay

## 2013-08-04 DIAGNOSIS — D500 Iron deficiency anemia secondary to blood loss (chronic): Secondary | ICD-10-CM | POA: Diagnosis not present

## 2013-08-04 DIAGNOSIS — E669 Obesity, unspecified: Secondary | ICD-10-CM | POA: Diagnosis present

## 2013-08-04 LAB — BASIC METABOLIC PANEL
BUN: 10 mg/dL (ref 6–23)
CO2: 24 mEq/L (ref 19–32)
Calcium: 8.8 mg/dL (ref 8.4–10.5)
Chloride: 104 mEq/L (ref 96–112)
Creatinine, Ser: 0.45 mg/dL — ABNORMAL LOW (ref 0.50–1.10)
GFR calc Af Amer: >90 mL/min (ref >90)
GFR calc non Af Amer: >90 mL/min (ref >90)
Glucose, Bld: 165 mg/dL — ABNORMAL HIGH (ref 70–99)
Potassium: 4.3 mEq/L (ref 3.5–5.1)
Sodium: 137 mEq/L (ref 135–145)

## 2013-08-04 LAB — CBC
HCT: 33.1 % — ABNORMAL LOW (ref 36.0–46.0)
Hemoglobin: 11.4 g/dL — ABNORMAL LOW (ref 12.0–15.0)
MCH: 31 pg (ref 26.0–34.0)
MCHC: 34.4 g/dL (ref 30.0–36.0)
MCV: 89.9 fL (ref 78.0–100.0)
Platelets: 194 10*3/uL (ref 150–400)
RBC: 3.68 MIL/uL — ABNORMAL LOW (ref 3.87–5.11)
RDW: 12.2 % (ref 11.5–15.5)
WBC: 12.4 10*3/uL — ABNORMAL HIGH (ref 4.0–10.5)

## 2013-08-04 MED ORDER — ASPIRIN 325 MG PO TBEC: 325.0000 mg | DELAYED_RELEASE_TABLET | Freq: Two times a day (BID) | ORAL | Status: AC

## 2013-08-04 MED ORDER — TRAMADOL HCL 50 MG PO TABS: 50.0000 mg | ORAL_TABLET | Freq: Four times a day (QID) | ORAL | Status: DC

## 2013-08-04 MED ORDER — DSS 100 MG PO CAPS: 100.0000 mg | ORAL_CAPSULE | Freq: Two times a day (BID) | ORAL | Status: DC

## 2013-08-04 MED ORDER — TIZANIDINE HCL 4 MG PO CAPS: 4.0000 mg | ORAL_CAPSULE | Freq: Three times a day (TID) | ORAL | Status: DC | PRN

## 2013-08-04 MED ORDER — FERROUS SULFATE 325 (65 FE) MG PO TABS: 325.0000 mg | ORAL_TABLET | Freq: Three times a day (TID) | ORAL | Status: DC

## 2013-08-04 MED ORDER — POLYETHYLENE GLYCOL 3350 17 G PO PACK: 17.0000 g | PACK | Freq: Two times a day (BID) | ORAL | Status: DC

## 2013-08-04 NOTE — Care Management Note (Addendum)
    Page 1 of 2   08/05/2013     2:25:27 PM   CARE MANAGEMENT NOTE 08/05/2013  Patient:  Phyllis Knight, Phyllis Knight   Account Number:  [id number]  Date Initiated:  08/04/2013  Documentation initiated by:  Colleen Can  Subjective/Objective Assessment:   dx left knee osteoarthritis; total knee replacemnt     Action/Plan:   CM spoke with patient. Plans are for patient to return to her home in [place] where spouse will be caqregiver. She has RW and commode seat.   Anticipated DC Date:  08/06/2013   Anticipated DC Plan:  HOME W HOME HEALTH SERVICES      DC Planning Services  CM consult      [facility] Choice  HOME HEALTH   Choice offered to / List presented to:  C-1 Patient   DME arranged  3-N-1  Levan Hurst      DME agency  [facility]     HH arranged  HH-2 PT  HH-3 OT      [facility] agency  [facility]   Status of service:  Completed, signed off Medicare Important Message given?  NA - LOS <3 / Initial given by admissions (If response is "NO", the following Medicare IM given date fields will be blank) Date Medicare IM given:   Date Additional Medicare IM given:    Discharge Disposition:  HOME W HOME HEALTH SERVICES  Per UR Regulation:    If discussed at Long Length of Stay Meetings, dates discussed:    Comments:  08/05/2013 Colleen Can BSN RN CCM [phone] Pt states she will need RW and 3n1. Walker that she currently has does not have wheels and commode seat does not have handles. She states she did not get them thru her insurance. [facility] rep notified of DME needs and will deliver to patient's room. Genevieve Norlander will provide [facility] services with start date of tomorrow 08/06/2013.

## 2013-08-04 NOTE — Evaluation (Signed)
Occupational Therapy Evaluation Patient Details Name: IZELA ALTIER MRN: [id number] DOB: May 05, 1941 Today's Date: 08/04/2013 Time: 1150-1207 OT Time Calculation (min): 17 min  OT Assessment / Plan / Recommendation History of present illness s/p L TKA   Clinical Impression   Pt was admitted for L TKA.  She will benefit from skilled OT to increase safety and independence with adls:  Min A level goals for toileting.  Husband will assist with LB adls.      OT Assessment  Patient needs continued OT Services    Follow Up Recommendations  Home health OT (depending upon progress)    Barriers to Discharge      Equipment Recommendations   (to be further assessed:  pt has elevated toilet seat no rail)    Recommendations for Other Services    Frequency  Min 2X/week    Precautions / Restrictions Precautions Precautions: Fall;Knee Restrictions Other Position/Activity Restrictions: WBAT   Pertinent Vitals/Pain L knee sore:  Declined ice.  Repositioned in bed    ADL  Grooming: Set up;Wash/dry hands Where Assessed - Grooming: Unsupported sitting Upper Body Bathing: Set up Where Assessed - Upper Body Bathing: Unsupported standing Lower Body Bathing: Moderate assistance Where Assessed - Lower Body Bathing: Supported sit to stand Upper Body Dressing: Minimal assistance Where Assessed - Upper Body Dressing: Unsupported sitting (iv) Lower Body Dressing: Maximal assistance Where Assessed - Lower Body Dressing: Supported sit to Pharmacist, hospital: Moderate assistance (for sit to stand; min A pivot once up) Toilet Transfer Method: Sit to stand Toilet Transfer Equipment: Bedside commode Toileting - Clothing Manipulation and Hygiene: Minimal assistance Where Assessed - Engineer, mining and Hygiene: Sit to stand from 3-in-1 or toilet Equipment Used: Rolling walker Transfers/Ambulation Related to ADLs: pt performed spt to bsc then took a few steps to bed; became dizzy just  before she sat down on bed ADL Comments: husband will assist with adls.  pt does not have AE.   Pt cued for safety with RW--tried to leave it behind once and doesn't push through with arms.     OT Diagnosis: Generalized weakness  OT Problem List: Decreased strength;Decreased activity tolerance;Decreased knowledge of use of DME or AE;Pain OT Treatment Interventions: Self-care/ADL training;DME and/or AE instruction;Patient/family education   OT Goals(Current goals can be found in the care plan section) Acute Rehab OT Goals Patient Stated Goal: go home with husband's help OT Goal Formulation: With patient Time For Goal Achievement: 08/11/13 Potential to Achieve Goals: Good ADL Goals Pt Will Perform Grooming: with supervision;standing Pt Will Transfer to Toilet: ambulating;with min assist (high toilet without rail support) Pt Will Perform Toileting - Clothing Manipulation and hygiene: with min guard assist;sit to/from stand  Visit Information  Last OT Received On: 08/04/13 Assistance Needed: +1 History of Present Illness: s/p L TKA       Prior Functioning     Home Living Family/patient expects to be discharged to:: Private residence Living Arrangements: Spouse/significant other Type of Home: House Home Access: Stairs to enter Secretary/administrator of Steps: 0 Home Layout: One level Home Equipment: Environmental consultant - 2 wheels;Toilet riser Prior Function Level of Independence: Independent Communication Communication: No difficulties         Vision/Perception     Cognition  Cognition Arousal/Alertness: Awake/alert Behavior During Therapy: WFL for tasks assessed/performed Overall Cognitive Status: Within Functional Limits for tasks assessed    Extremity/Trunk Assessment Upper Extremity Assessment Upper Extremity Assessment: Generalized weakness Lower Extremity Assessment Lower Extremity Assessment: LLE deficits/detail LLE Deficits /  Details: ankle WFL but painful  dorsiflexion;  able to assist with SLR; AAROM knee flexion to 40*; R knee "bad" per pt     Mobility Bed Mobility Bed Mobility: Sit to Supine Sit to Supine: 4: Min assist Details for Bed Mobility Assistance: assist for LLE Transfers Transfers: Sit to Stand Sit to Stand: 3: Mod assist Details for Transfer Assistance: assist to rise and steady     Exercise     Balance     End of Session OT - End of Session Activity Tolerance: Patient limited by fatigue Patient left: in bed;with call bell/phone within reach  GO     [facility] 08/04/2013, 12:59 PM Marica Otter, OTR/L [phone] 08/04/2013

## 2013-08-04 NOTE — Evaluation (Signed)
Physical Therapy Evaluation Patient Details Name: Phyllis Knight MRN: [id number] DOB: 17-Jan-1941 Today's Date: 08/04/2013 Time: 4098-1191 PT Time Calculation (min): 29 min  PT Assessment / Plan / Recommendation History of Present Illness  s/p L TKA  Clinical Impression  Will continue to follow, not ready to D/C home today;    PT Assessment  Patient needs continued PT services    Follow Up Recommendations  Home health PT    Does the patient have the potential to tolerate intense rehabilitation      Barriers to Discharge        Equipment Recommendations  None recommended by PT    Recommendations for Other Services     Frequency 7X/week    Precautions / Restrictions Precautions Precautions: Fall;Knee Restrictions Other Position/Activity Restrictions: WBAT   Pertinent Vitals/Pain Pain in left knee, not rated, was premedicated      Mobility  Bed Mobility Bed Mobility: Supine to Sit Supine to Sit: 4: Min assist Sit to Supine: 4: Min assist Details for Bed Mobility Assistance: assist for LLE; verbal cues for safe technique Transfers Transfers: Sit to Stand;Stand to Sit;Stand Pivot Transfers Sit to Stand: 3: Mod assist Stand to Sit: 3: Mod assist Stand Pivot Transfers: 4: Min assist;3: Mod assist Details for Transfer Assistance: multi-modal cues for hand placement, safety, technique Ambulation/Gait Ambulation/Gait Assistance: 4: Min assist;3: Mod assist Ambulation Distance (Feet): 10 Feet Assistive device: Rolling walker Ambulation/Gait Assistance Details: multi-modal cues for sequence, RW position Gait Pattern: Step-to pattern;Antalgic    Exercises Total Joint Exercises Ankle Circles/Pumps: AROM;Both;10 reps Quad Sets: AROM;5 reps;Both   PT Diagnosis: Difficulty walking  PT Problem List: Decreased strength;Decreased range of motion;Decreased activity tolerance;Decreased balance;Decreased mobility;Decreased knowledge of use of DME;Decreased knowledge of  precautions PT Treatment Interventions: DME instruction;Gait training;Stair training;Functional mobility training;Therapeutic activities;Therapeutic exercise;Patient/family education     PT Goals(Current goals can be found in the care plan section) Acute Rehab PT Goals Patient Stated Goal: go home with husband's help PT Goal Formulation: With patient Time For Goal Achievement: 08/09/13 Potential to Achieve Goals: Good  Visit Information  Last PT Received On: 08/04/13 Assistance Needed: +1 History of Present Illness: s/p L TKA       Prior Functioning  Home Living Family/patient expects to be discharged to:: Private residence Living Arrangements: Spouse/significant other Type of Home: House Home Access: Stairs to enter Secretary/administrator of Steps: 0 Home Layout: One level Home Equipment: Environmental consultant - 2 wheels;Toilet riser Prior Function Level of Independence: Independent Communication Communication: No difficulties    Cognition  Cognition Arousal/Alertness: Awake/alert Behavior During Therapy: WFL for tasks assessed/performed Overall Cognitive Status: Within Functional Limits for tasks assessed    Extremity/Trunk Assessment Upper Extremity Assessment Upper Extremity Assessment: Generalized weakness Lower Extremity Assessment Lower Extremity Assessment: LLE deficits/detail LLE Deficits / Details: ankle WFL but painful dorsiflexion;  able to assist with SLR; AAROM knee flexion to 40*; R knee "bad" per pt   Balance    End of Session PT - End of Session Equipment Utilized During Treatment: Gait belt Activity Tolerance: Patient tolerated treatment well Patient left: in chair;with call bell/phone within reach Nurse Communication: Mobility status  GP     [facility] 08/04/2013, 1:03 PM

## 2013-08-04 NOTE — Progress Notes (Signed)
   Subjective: 1 Day Post-Op Procedure(s) (LRB): LEFT TOTAL KNEE ARTHROPLASTY (Left)   Patient reports pain as mild, pain well controlled. No events throughout the night. Ready to be discharged home if she does well with PT and pain stays controlled.   Objective:   VITALS:   Filed Vitals:   08/04/13 0655  BP: 117/74  Pulse: 64  Temp: 98.1 F (36.7 C)  Resp: 16    Neurovascular intact Dorsiflexion/Plantar flexion intact Incision: dressing C/D/I No cellulitis present Compartment soft  LABS  Recent Labs  08/04/13 0415  HGB 11.4*  HCT 33.1*  WBC 12.4*  PLT 194     Recent Labs  08/04/13 0415  NA 137  K 4.3  BUN 10  CREATININE 0.45*  GLUCOSE 165*     Assessment/Plan: 1 Day Post-Op Procedure(s) (LRB): LEFT TOTAL KNEE ARTHROPLASTY (Left) HV drain d/c'ed Foley cath d/c'ed Advance diet Up with therapy D/C IV fluids Discharge home with home health Follow up in 2 weeks at [facility]. Follow up with OLIN,Anaira Seay D in 2 weeks.  Contact information:  [facility] [address] [place] [address] [phone]    Expected ABLA  Treated with iron and will observe  Obese (BMI 30-39.9) Estimated body mass index is 35.04 kg/(m^2) as calculated from the following:   Height as of this encounter: 5\' 6"  (1.676 m).   Weight as of this encounter: 98.431 kg (217 lb). Patient also counseled that weight may inhibit the healing process Patient counseled that losing weight will help with future health issues       Phyllis Knight. Phyllis Knight   PAC  08/04/2013, 8:22 AM

## 2013-08-04 NOTE — Progress Notes (Addendum)
08/04/13 1500  PT Visit Information  Last PT Received On 08/04/13  Assistance Needed +2 (safety)  History of Present Illness s/p L TKA  PT Time Calculation  PT Start Time 1406  PT Stop Time 1442  PT Time Calculation (min) 36 min  Subjective Data  Patient Stated Goal go home with husband's help  Precautions  Precautions Fall;Knee  Restrictions  Other Position/Activity Restrictions WBAT  Cognition  Arousal/Alertness Awake/alert  Behavior During Therapy WFL for tasks assessed/performed  Overall Cognitive Status Within Functional Limits for tasks assessed  Bed Mobility  Bed Mobility Sit to Supine  Sit to Supine 4: Min assist  Details for Bed Mobility Assistance assist for LLE; verbal cues for safe technique  Transfers  Transfers Sit to Stand;Stand to Sit;Stand Pivot Transfers  Sit to Stand 4: Min assist;3: Mod assist;From chair/3-in-1  Stand to Sit 3: Mod assist  Details for Transfer Assistance multi-modal cues for hand placement, safety, technique  Ambulation/Gait  Ambulation/Gait Assistance 4: Min assist  Ambulation Distance (Feet) 55 Feet  Assistive device Rolling walker  Ambulation/Gait Assistance Details multi-modal cues for sequence, RW position  Pt dizzy after amb ~50', chair to pt; VSS--BP 116/67  Gait Pattern Step-to pattern;Antalgic  Total Joint Exercises  Ankle Circles/Pumps AROM;Both;10 reps  [employer] AROM;5 reps;Both  Short Arc Quad AROM;AAROM;Strengthening;Left;10 reps  Heel Slides Left;AROM;AAROM;10 reps  Hip ABduction/ADduction AROM;AAROM;Left;10 reps  Straight Leg Raises AAROM;AROM;Left;10 reps  PT - End of Session  Equipment Utilized During Treatment Gait belt  Activity Tolerance Patient tolerated treatment well  Patient left in chair;with call bell/phone within reach  PT - Assessment/Plan  PT Plan Current plan remains appropriate  PT Frequency 7X/week  Follow Up Recommendations Home health PT  PT equipment None recommended by PT  PT Goal  Progression  Progress towards PT goals Progressing toward goals  Acute Rehab PT Goals  Time For Goal Achievement 08/09/13  Potential to Achieve Goals Good  PT General Charges  $$ ACUTE PT VISIT 1 Procedure  PT Treatments  $Gait Training 8-22 mins  $Therapeutic Exercise 8-22 mins

## 2013-08-04 NOTE — Progress Notes (Signed)
Utilization review completed.  

## 2013-08-05 LAB — CBC
HCT: 28.3 % — ABNORMAL LOW (ref 36.0–46.0)
Hemoglobin: 9.9 g/dL — ABNORMAL LOW (ref 12.0–15.0)
MCH: 31.5 pg (ref 26.0–34.0)
MCHC: 35 g/dL (ref 30.0–36.0)
MCV: 90.1 fL (ref 78.0–100.0)
Platelets: 180 10*3/uL (ref 150–400)
RBC: 3.14 MIL/uL — ABNORMAL LOW (ref 3.87–5.11)
RDW: 12.4 % (ref 11.5–15.5)
WBC: 12.2 10*3/uL — ABNORMAL HIGH (ref 4.0–10.5)

## 2013-08-05 LAB — BASIC METABOLIC PANEL
BUN: 17 mg/dL (ref 6–23)
CO2: 27 mEq/L (ref 19–32)
Calcium: 9 mg/dL (ref 8.4–10.5)
Chloride: 104 mEq/L (ref 96–112)
Creatinine, Ser: 0.6 mg/dL (ref 0.50–1.10)
GFR calc Af Amer: >90 mL/min (ref >90)
GFR calc non Af Amer: 89 mL/min — ABNORMAL LOW (ref >90)
Glucose, Bld: 132 mg/dL — ABNORMAL HIGH (ref 70–99)
Potassium: 4 mEq/L (ref 3.5–5.1)
Sodium: 136 mEq/L (ref 135–145)

## 2013-08-05 NOTE — Progress Notes (Signed)
   Subjective: 2 Days Post-Op Procedure(s) (LRB): LEFT TOTAL KNEE ARTHROPLASTY (Left)   Patient reports pain as mild, pain well controlled. No events throughout the night. Ready to be discharged home.  Objective:   VITALS:   Filed Vitals:   08/05/13 0800  BP: 148/84  Pulse: 71  Temp: 99.2 F (37.3 C)   Resp: 16    Neurovascular intact Dorsiflexion/Plantar flexion intact Incision: dressing C/D/I No cellulitis present Compartment soft  LABS  Recent Labs  08/04/13 0415 08/05/13 0435  HGB 11.4* 9.9*  HCT 33.1* 28.3*  WBC 12.4* 12.2*  PLT 194 180     Recent Labs  08/04/13 0415 08/05/13 0435  NA 137 136  K 4.3 4.0  BUN 10 17  CREATININE 0.45* 0.60  GLUCOSE 165* 132*     Assessment/Plan: 2 Days Post-Op Procedure(s) (LRB): LEFT TOTAL KNEE ARTHROPLASTY (Left) Up with therapy Discharge home with home health Follow up in 2 weeks at [facility]. Follow up with OLIN,Tomas Schamp D in 2 weeks.  Contact information:  [facility] [address], [address] [place] [address] [phone]    Expected ABLA  Treated with iron and will observe   Obese (BMI 30-39.9)  Estimated body mass index is 35.04 kg/(m^2) as calculated from the following:      Height as of this encounter: 5\' 6"  (1.676 m).      Weight as of this encounter: 98.431 kg (217 lb).  Patient also counseled that weight may inhibit the healing process  Patient counseled that losing weight will help with future health issues         Phyllis Knight. Phyllis Knight   PAC  08/05/2013, 9:13 AM

## 2013-08-05 NOTE — Progress Notes (Signed)
Occupational Therapy Treatment Patient Details Name: Phyllis Knight MRN: [id number] DOB: 1941/10/05 Today's Date: 08/05/2013 Time: 6213-0865 OT Time Calculation (min): 23 min  OT Assessment / Plan / Recommendation  History of present illness s/p L TKA   OT comments  Pt demonstrates improved independence with BADLs - currently requires min A.  Husband is available to assist. No DME recommended for home   Follow Up Recommendations  Home health OT    Barriers to Discharge       Equipment Recommendations  None recommended by OT    Recommendations for Other Services    Frequency Min 2X/week   Progress towards OT Goals Progress towards OT goals: Goals met and updated - see care plan  Plan Discharge plan remains appropriate    Precautions / Restrictions Precautions Precautions: Fall;Knee Restrictions Other Position/Activity Restrictions: WBAT   Pertinent Vitals/Pain     ADL  Upper Body Dressing: Min guard Where Assessed - Upper Body Dressing: Supported standing Lower Body Dressing: Minimal assistance (LOB min A to recover) Where Assessed - Lower Body Dressing: Supported sit to Pharmacist, hospital: Minimal assistance Toilet Transfer Method: Sit to Barista: Comfort height toilet;Grab bars Toileting - Architect and Hygiene: Min guard Where Assessed - Engineer, mining and Hygiene: Standing Equipment Used: Rolling walker Transfers/Ambulation Related to ADLs: min guard assist ADL Comments: Pt able reports husband will be able to provide min A for balance and safety during BADLs    OT Diagnosis:    OT Problem List:   OT Treatment Interventions:     OT Goals(current goals can now be found in the care plan section) Acute Rehab OT Goals Patient Stated Goal: go home with husband's help OT Goal Formulation: With patient Time For Goal Achievement: 08/11/13 Potential to Achieve Goals: Good ADL Goals Pt Will Perform Grooming:  with supervision;standing Pt Will Perform Lower Body Dressing: with min guard assist Pt Will Transfer to Toilet: ambulating;with min assist Pt Will Perform Toileting - Clothing Manipulation and hygiene: with min guard assist;sit to/from stand  Visit Information  Last OT Received On: 08/05/13 Assistance Needed: +1 History of Present Illness: s/p L TKA    Subjective Data      Prior Functioning       Cognition  Cognition Arousal/Alertness: Awake/alert Behavior During Therapy: WFL for tasks assessed/performed Overall Cognitive Status: Within Functional Limits for tasks assessed    Mobility  Bed Mobility Bed Mobility: Not assessed Transfers Transfers: Sit to Stand;Stand to Sit Sit to Stand: With upper extremity assist;From chair/3-in-1;From toilet;5: Supervision Stand to Sit: 4: Min guard;4: Min assist;With upper extremity assist;To chair/3-in-1;To toilet (min A to toilet) Details for Transfer Assistance: cues for hand placement    Exercises  Total Joint Exercises Ankle Circles/Pumps: AROM;Both;10 reps Short Arc Quad: AROM;AAROM;Strengthening;Left;10 reps Heel Slides: Left;AROM;AAROM;10 reps   Balance Balance Balance Assessed: Yes Dynamic Standing Balance Dynamic Standing - Balance Support: No upper extremity supported Dynamic Standing - Level of Assistance: 4: Min assist Dynamic Standing - Balance Activities: Other (comment) (LB ADLs)   End of Session OT - End of Session Equipment Utilized During Treatment: Rolling walker Activity Tolerance: Patient tolerated treatment well Patient left: in chair;with call bell/phone within reach  GO     Phyllis Knight 08/05/2013, 12:07 PM

## 2013-08-05 NOTE — Progress Notes (Signed)
Physical Therapy Treatment Patient Details Name: Phyllis Knight MRN: [id number] DOB: 1941-01-22 Today's Date: 08/05/2013 Time: 1100-1131 PT Time Calculation (min): 31 min  PT Assessment / Plan / Recommendation  History of Present Illness s/p L TKA   PT Comments   Doing well  Follow Up Recommendations  Home health PT;Supervision - Intermittent     Does the patient have the potential to tolerate intense rehabilitation     Barriers to Discharge        Equipment Recommendations  None recommended by PT    Recommendations for Other Services    Frequency 7X/week   Progress towards PT Goals Progress towards PT goals: Progressing toward goals  Plan Current plan remains appropriate    Precautions / Restrictions Precautions Precautions: Fall;Knee Restrictions Other Position/Activity Restrictions: WBAT   Pertinent Vitals/Pain     Mobility  Transfers Transfers: Sit to Stand;Stand to Sit;Stand Pivot Transfers Sit to Stand: 5: Supervision;4: Min guard Stand to Sit: 4: Min guard Details for Transfer Assistance: multi-modal cues for hand placement, safety, technique Ambulation/Gait Ambulation/Gait Assistance: 4: Min guard;5: Supervision Ambulation Distance (Feet): 80 Feet Assistive device: Rolling walker Ambulation/Gait Assistance Details: multi-modal cues for sequence, RW position Gait Pattern: Step-to pattern;Antalgic Stairs: Yes Stairs Assistance: 4: Min assist Stairs Assistance Details (indicate cue type and reason): cues for sequence and RW safety Stair Management Technique: No rails;Backwards;With walker Number of Stairs: 2    Exercises Total Joint Exercises Ankle Circles/Pumps: AROM;Both;10 reps Short Arc Quad: AROM;AAROM;Strengthening;Left;10 reps Heel Slides: Left;AROM;AAROM;10 reps   PT Diagnosis:    PT Problem List:   PT Treatment Interventions:     PT Goals (current goals can now be found in the care plan section) Acute Rehab PT Goals Patient Stated Goal:  go home with husband's help Time For Goal Achievement: 08/09/13 Potential to Achieve Goals: Good  Visit Information  Last PT Received On: 08/05/13 Assistance Needed: +1 History of Present Illness: s/p L TKA    Subjective Data  Patient Stated Goal: go home with husband's help   Cognition  Cognition Arousal/Alertness: Awake/alert Behavior During Therapy: WFL for tasks assessed/performed Overall Cognitive Status: Within Functional Limits for tasks assessed    Balance     End of Session PT - End of Session Equipment Utilized During Treatment: Gait belt Activity Tolerance: Patient tolerated treatment well Patient left: in chair;with call bell/phone within reach   GP     [facility] 08/05/2013, 11:43 AM

## 2013-08-05 NOTE — Progress Notes (Signed)
[facility]  [facility] is providing the following services: RW and Commode  If patient discharges after hours, please call [phone].   Renard Hamper 08/05/2013, 11:16 AM

## 2013-08-05 NOTE — Progress Notes (Signed)
Pt to d/c home with [facility] home [facility]. DME delivered to room before d/c. AVS reviewed and "My Chart" discussed with pt. Pt capable of verbalizing medications and follow-up appointments. Remains hemodynamically stable. No signs and symptoms of distress. Educated pt to return to ER in the case of SOB, dizziness, or chest pain.

## 2013-08-06 NOTE — Discharge Summary (Signed)
Physician Discharge Summary  Patient ID: Phyllis Knight MRN: [id number] DOB/AGE: 73/25/1942 73 y.o.  Admit date: 08/03/2013 Discharge date: 08/05/2013   Procedures:  Procedure(s) (LRB): LEFT TOTAL KNEE ARTHROPLASTY (Left)  Attending Physician:  Dr. Durene Romans   Admission Diagnoses:   Left knee OA / pain.  Discharge Diagnoses:  Principal Problem:   S/P left TKA Active Problems:   Expected blood loss anemia   Obese  Past Medical History  Diagnosis Date  . Arthritis   . Fibromyalgia   . Hypertension   . GERD (gastroesophageal reflux disease)   . H/O hiatal hernia   . Cancer     uterine  . Pneumonia   . Bradycardia   . Anxiety     regarding surgery    HPI: Phyllis Knight, 72 y.o. female, has a history of pain and functional disability in the left knee due to arthritis and has failed non-surgical conservative treatments for greater than 12 weeks to includeNSAID's and/or analgesics, corticosteriod injections, use of assistive devices and activity modification. Onset of symptoms was gradual, starting 3+ years ago with gradually worsening course since that time. The patient noted prior procedures on the knee to include arthroscopy on the left knee(s). Patient currently rates pain in the left knee(s) at 8 out of 10 with activity. Patient has night pain, worsening of pain with activity and weight bearing, pain that interferes with activities of daily living, pain with passive range of motion, crepitus and joint swelling. Patient has evidence of periarticular osteophytes and joint space narrowing by imaging studies. There is no active infection. Risks, benefits and expectations were discussed with the patient. Patient understand the risks, benefits and expectations and wishes to proceed with surgery.  PCP: Phyllis Found, MD   Discharged Condition: good  Hospital Course:  Patient underwent the above stated procedure on 08/03/2013. Patient tolerated the procedure well and  brought to the recovery room in good condition and subsequently to the floor.  POD #1 BP: 117/74 ; Pulse: 64 ; Temp: 98.1 F (36.7 C) ; Resp: 16 Pt's foley was removed, as well as the hemovac drain removed. IV was changed to a saline lock. Patient reports pain as mild, pain well controlled. No events throughout the night. Neurovascular intact, dorsiflexion/plantar flexion intact, incision: dressing C/D/I, no cellulitis present and compartment soft.   LABS  Basename    HGB  11.4  HCT  33.1   POD #2  BP: 148/84 ; Pulse: 71 ; Temp: 99.2 F (37.3 C) ; Resp: 16 Patient reports pain as mild, pain well controlled. No events throughout the night. Ready to be discharged home. Neurovascular intact, dorsiflexion/plantar flexion intact, incision: dressing C/D/I, no cellulitis present and compartment soft.   LABS  Basename    HGB  9.9  HCT  28.3    Discharge Exam: General appearance: alert, cooperative and no distress Extremities: Homans sign is negative, no sign of DVT, no edema, redness or tenderness in the calves or thighs and no ulcers, gangrene or trophic changes  Disposition:    Home-Health Care Svc with follow up in 2 weeks   Follow-up Information   Follow up with Shelda Pal, MD. Schedule an appointment as soon as possible for a visit in 2 weeks.   Specialty:  Orthopedic Surgery   Contact information:   [address] [place] [address] [phone]       Discharge Orders   Future Orders Complete By Expires   Call MD / Call  911  As directed    Comments:     If you experience chest pain or shortness of breath, CALL 911 and be transported to the hospital emergency room.  If you develope a fever above 101 F, pus (white drainage) or increased drainage or redness at the wound, or calf pain, call your surgeon's office.   Change dressing  As directed    Comments:     Maintain surgical dressing for 10-14 days, then change the dressing daily with sterile 4 x 4  inch gauze dressing and tape. Keep the area dry and clean.   Constipation Prevention  As directed    Comments:     Drink plenty of fluids.  Prune juice may be helpful.  You may use a stool softener, such as Colace (over the counter) 100 mg twice a day.  Use MiraLax (over the counter) for constipation as needed.   Diet - low sodium heart healthy  As directed    Discharge instructions  As directed    Comments:     Maintain surgical dressing for 10-14 days, then replace with gauze and tape. Keep the area dry and clean until follow up. Follow up in 2 weeks at [facility]. Call with any questions or concerns.   Increase activity slowly as tolerated  As directed    TED hose  As directed    Comments:     Use stockings (TED hose) for 2 weeks on both leg(s).  You may remove them at night for sleeping.   Weight bearing as tolerated  As directed         Medication List    STOP taking these medications       ciprofloxacin 500 MG tablet  Commonly known as:  CIPRO      TAKE these medications       amLODipine 10 MG tablet  Commonly known as:  NORVASC  Take 10 mg by mouth every morning.     aspirin 325 MG EC tablet  Take 1 tablet (325 mg total) by mouth 2 (two) times daily.     atenolol 25 MG tablet  Commonly known as:  TENORMIN  Take 12.5 mg by mouth every morning.     benazepril 20 MG tablet  Commonly known as:  LOTENSIN  Take 20 mg by mouth every morning.     cholecalciferol 400 UNITS Tabs tablet  Commonly known as:  VITAMIN D  Take by mouth.     DSS 100 MG Caps  Take 100 mg by mouth 2 (two) times daily.     ferrous sulfate 325 (65 FE) MG tablet  Take 1 tablet (325 mg total) by mouth 3 (three) times daily after meals.     omeprazole 20 MG capsule  Commonly known as:  PRILOSEC  Take 20 mg by mouth daily.     polyethylene glycol packet  Commonly known as:  MIRALAX / GLYCOLAX  Take 17 g by mouth 2 (two) times daily.     tiZANidine 4 MG capsule  Commonly known  as:  ZANAFLEX  Take 1 capsule (4 mg total) by mouth 3 (three) times daily as needed for muscle spasms.     TOVIAZ 8 MG Tb24 tablet  Generic drug:  fesoterodine  Take 8 mg by mouth daily.     traMADol 50 MG tablet  Commonly known as:  ULTRAM  Take 1-2 tablets (50-100 mg total) by mouth every 6 (six) hours.  Signed: Anastasio Auerbach. Mixtli Reno   PAC  08/06/2013, 1:06 PM

## 2013-09-20 ENCOUNTER — Encounter (HOSPITAL_BASED_OUTPATIENT_CLINIC_OR_DEPARTMENT_OTHER): Payer: Self-pay | Admitting: *Deleted

## 2013-09-20 NOTE — Progress Notes (Signed)
NPO AFTER MN. ARRIVES AT 1045. NEEDS ISTAT.  CURRENT EKG IN CHART. WILL TAKE ATENOLOL, NORVASC, PRILSOSEC AND HYDROCODONE AM DOS W/ SIPS OF WATER.

## 2013-09-21 ENCOUNTER — Ambulatory Visit (HOSPITAL_BASED_OUTPATIENT_CLINIC_OR_DEPARTMENT_OTHER): Payer: Medicare Other | Admitting: Anesthesiology

## 2013-09-21 ENCOUNTER — Encounter (HOSPITAL_BASED_OUTPATIENT_CLINIC_OR_DEPARTMENT_OTHER)
Admission: RE | Disposition: A | Discharge status: Home / Self Care | Payer: Self-pay | Source: Ambulatory Visit | Attending: Orthopedic Surgery

## 2013-09-21 ENCOUNTER — Encounter (HOSPITAL_BASED_OUTPATIENT_CLINIC_OR_DEPARTMENT_OTHER): Payer: Medicare Other | Admitting: Anesthesiology

## 2013-09-21 ENCOUNTER — Ambulatory Visit (HOSPITAL_BASED_OUTPATIENT_CLINIC_OR_DEPARTMENT_OTHER)
Admission: RE | Admit: 2013-09-21 | Discharge: 2013-09-21 | Disposition: A | Discharge status: Home / Self Care | Payer: Medicare Other | Source: Ambulatory Visit | Attending: Orthopedic Surgery | Admitting: Orthopedic Surgery

## 2013-09-21 ENCOUNTER — Encounter (HOSPITAL_BASED_OUTPATIENT_CLINIC_OR_DEPARTMENT_OTHER): Payer: Self-pay | Admitting: *Deleted

## 2013-09-21 DIAGNOSIS — Z9889 Other specified postprocedural states: Secondary | ICD-10-CM

## 2013-09-21 DIAGNOSIS — Z7982 Long term (current) use of aspirin: Secondary | ICD-10-CM | POA: Insufficient documentation

## 2013-09-21 DIAGNOSIS — IMO0001 Reserved for inherently not codable concepts without codable children: Secondary | ICD-10-CM | POA: Insufficient documentation

## 2013-09-21 DIAGNOSIS — K219 Gastro-esophageal reflux disease without esophagitis: Secondary | ICD-10-CM | POA: Insufficient documentation

## 2013-09-21 DIAGNOSIS — I10 Essential (primary) hypertension: Secondary | ICD-10-CM | POA: Insufficient documentation

## 2013-09-21 DIAGNOSIS — M129 Arthropathy, unspecified: Secondary | ICD-10-CM | POA: Insufficient documentation

## 2013-09-21 DIAGNOSIS — M24669 Ankylosis, unspecified knee: Secondary | ICD-10-CM | POA: Insufficient documentation

## 2013-09-21 DIAGNOSIS — Z8542 Personal history of malignant neoplasm of other parts of uterus: Secondary | ICD-10-CM | POA: Insufficient documentation

## 2013-09-21 DIAGNOSIS — M2469 Ankylosis, other specified joint: Secondary | ICD-10-CM | POA: Insufficient documentation

## 2013-09-21 DIAGNOSIS — Z90710 Acquired absence of both cervix and uterus: Secondary | ICD-10-CM | POA: Insufficient documentation

## 2013-09-21 DIAGNOSIS — Z96659 Presence of unspecified artificial knee joint: Secondary | ICD-10-CM | POA: Insufficient documentation

## 2013-09-21 HISTORY — DX: Urge incontinence: N39.41

## 2013-09-21 HISTORY — DX: Personal history of malignant neoplasm of other parts of uterus: Z85.42

## 2013-09-21 HISTORY — PX: KNEE CLOSED REDUCTION: SHX995

## 2013-09-21 HISTORY — DX: Ankylosis, left knee: M24.662

## 2013-09-21 LAB — POCT I-STAT 4, (NA,K, GLUC, HGB,HCT)
Glucose, Bld: 100 mg/dL — ABNORMAL HIGH (ref 70–99)
HCT: 38 % (ref 36.0–46.0)
Hemoglobin: 12.9 g/dL (ref 12.0–15.0)
Potassium: 3.6 mEq/L (ref 3.5–5.1)
Sodium: 142 mEq/L (ref 135–145)

## 2013-09-21 SURGERY — MANIPULATION, KNEE, CLOSED
Anesthesia: Monitor Anesthesia Care | Site: Knee | Laterality: Left | Wound class: Clean

## 2013-09-21 MED ORDER — PROPOFOL 10 MG/ML IV BOLUS
INTRAVENOUS | Status: DC | PRN
  Administered 2013-09-21: 150 mg via INTRAVENOUS

## 2013-09-21 MED ORDER — ROPIVACAINE HCL 5 MG/ML IJ SOLN
INTRAMUSCULAR | Status: DC | PRN
  Administered 2013-09-21: 30 mL

## 2013-09-21 MED ORDER — HYDROCODONE-ACETAMINOPHEN 5-325 MG PO TABS: 1.0000 | ORAL_TABLET | ORAL | Status: DC | PRN

## 2013-09-21 MED ORDER — TIZANIDINE HCL 4 MG PO CAPS: 4.0000 mg | ORAL_CAPSULE | Freq: Three times a day (TID) | ORAL | Status: DC | PRN

## 2013-09-21 MED ORDER — HYDROCODONE-ACETAMINOPHEN 5-325 MG PO TABS
1.0000 | ORAL_TABLET | ORAL | Status: DC | PRN
  Administered 2013-09-21: 1 via ORAL
  Filled 2013-09-21: qty 1

## 2013-09-21 MED ORDER — LACTATED RINGERS IV SOLN
INTRAVENOUS | Status: DC
  Filled 2013-09-21: qty 1000

## 2013-09-21 MED ORDER — FENTANYL CITRATE 0.05 MG/ML IJ SOLN
100.0000 ug | Freq: Once | INTRAMUSCULAR | Status: DC
  Filled 2013-09-21: qty 2

## 2013-09-21 MED ORDER — MIDAZOLAM HCL 2 MG/ML PO SYRP
0.5000 mg/kg | ORAL_SOLUTION | Freq: Once | ORAL | Status: AC
  Administered 2013-09-21: 49.4 mg via ORAL
  Filled 2013-09-21: qty 26

## 2013-09-21 MED ORDER — LACTATED RINGERS IV SOLN
INTRAVENOUS | Status: DC
  Administered 2013-09-21 (×2): via INTRAVENOUS
  Filled 2013-09-21: qty 1000

## 2013-09-21 MED ORDER — FENTANYL CITRATE 0.05 MG/ML IJ SOLN
25.0000 ug | INTRAMUSCULAR | Status: DC | PRN
  Administered 2013-09-21: 100 ug via INTRAVENOUS
  Administered 2013-09-21 (×4): 25 ug via INTRAVENOUS
  Filled 2013-09-21: qty 1

## 2013-09-21 SURGICAL SUPPLY — 2 items
CLOTH BEACON ORANGE TIMEOUT ST (SAFETY) ×2 IMPLANT
IMMOBILIZER KNEE 22 UNIV (SOFTGOODS) ×2 IMPLANT

## 2013-09-21 NOTE — Anesthesia Postprocedure Evaluation (Signed)
  Anesthesia Post-op Note  Patient: Phyllis Knight  Procedure(s) Performed: Procedure(s) (LRB): CLOSED MANIPULATION LEFT KNEE (Left)  Patient Location: PACU  Anesthesia Type: General  Level of Consciousness: awake and alert   Airway and Oxygen Therapy: Patient Spontanous Breathing  Post-op Pain: mild  Post-op Assessment: Post-op Vital signs reviewed, Patient's Cardiovascular Status Stable, Respiratory Function Stable, Patent Airway and No signs of Nausea or vomiting  Last Vitals:  Filed Vitals:   09/21/13 1245  BP:   Pulse:   Temp: 36.2 C  Resp:     Post-op Vital Signs: stable   Complications: No apparent anesthesia complications

## 2013-09-21 NOTE — Transfer of Care (Signed)
Immediate Anesthesia Transfer of Care Note  Patient: Phyllis Knight  Procedure(s) Performed: Procedure(s) (LRB): CLOSED MANIPULATION LEFT KNEE (Left)  Patient Location: PACU  Anesthesia Type: MAC  Level of Consciousness: awake, alert , oriented and patient cooperative  Airway & Oxygen Therapy: Patient Spontanous Breathing and Patient connected to face mask oxygen  Post-op Assessment: Report given to PACU RN and Post -op Vital signs reviewed and stable  Post vital signs: Reviewed and stable  Complications: No apparent anesthesia complications

## 2013-09-21 NOTE — H&P (Signed)
CC- Phyllis Knight is a 72 y.o. female who presents status post left total knee replacement with post operative arthrofibrosis with limited flexion at 6 weeks associated with pain.  HPI- . Knee Pain: Patient presents now 6-7 weeks from her left total knee.  Despite aggressive therapy she had no made significant gains in her motion since her initial post op visit.  After reviewing this with her and her husband we determined that it was best for [place] to proceed with a manipulation under anesthesia to maximize her optimal potential.  Past Medical History  Diagnosis Date  . Arthritis   . Fibromyalgia   . Hypertension   . GERD (gastroesophageal reflux disease)   . H/O hiatal hernia   . Bradycardia   . History of uterine cancer     s/p hysterectomy  . 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 Urge urinary incontinence     Past Surgical History  Procedure Laterality Date  . Tympanoplasty Left   . Total knee arthroplasty Left 08/03/2013    Procedure: LEFT TOTAL KNEE ARTHROPLASTY;  Surgeon: Shelda Pal, MD;  Location: [facility];  Service: Orthopedics;  Laterality: Left;  . Total abdominal hysterectomy w/ bilateral salpingoophorectomy  1972  . Tracheotomy    . Cervical fusion  2011    Prior to Admission medications   Medication Sig Start Date End Date Taking? Authorizing Provider  amLODipine (NORVASC) 10 MG tablet Take 10 mg by mouth every morning.   Yes Historical Provider, MD  aspirin EC 325 MG tablet Take 325 mg by mouth daily.   Yes Historical Provider, MD  atenolol (TENORMIN) 25 MG tablet Take 12.5 mg by mouth every morning.   Yes Historical Provider, MD  benazepril (LOTENSIN) 20 MG tablet Take 20 mg by mouth every morning.   Yes Historical Provider, MD  cholecalciferol (VITAMIN D) 400 UNITS TABS tablet Take by mouth.   Yes Historical Provider, MD  docusate sodium 100 MG CAPS Take 100 mg by mouth 2 (two) times daily. 08/04/13  Yes Genelle Gather Babish, PA-C  ferrous  sulfate 325 (65 FE) MG tablet Take 1 tablet (325 mg total) by mouth 3 (three) times daily after meals. 08/04/13  Yes Genelle Gather Babish, PA-C  fesoterodine (TOVIAZ) 8 MG TB24 tablet Take 8 mg by mouth daily.   Yes Historical Provider, MD  HYDROcodone-acetaminophen (NORCO/VICODIN) 5-325 MG per tablet Take 1 tablet by mouth every 6 (six) hours as needed for pain.   Yes Historical Provider, MD  omeprazole (PRILOSEC) 20 MG capsule Take 20 mg by mouth every morning.    Yes Historical Provider, MD  polyethylene glycol (MIRALAX / GLYCOLAX) packet Take 17 g by mouth 2 (two) times daily. 08/04/13  Yes Genelle Gather Babish, PA-C  tiZANidine (ZANAFLEX) 4 MG capsule Take 1 capsule (4 mg total) by mouth 3 (three) times daily as needed for muscle spasms. 08/04/13  Yes Genelle Gather Babish, PA-C  traMADol (ULTRAM) 50 MG tablet Take 1-2 tablets (50-100 mg total) by mouth every 6 (six) hours. 08/04/13  Yes Genelle Gather Babish, PA-C    antalgic gait, reduced range of motion, normal ipsilateral hip exam, normal contralateral knee exam  Physical Examination: General appearance - alert, well appearing, and in no distress Mental status - alert, oriented to person, place, and time Chest - clear to auscultation, no wheezes, rales or rhonchi, symmetric air entry Heart - normal rate, regular rhythm, normal S1, S2, no murmurs, rubs, clicks  or gallops, normal rate and regular rhythm Abdomen - soft, nontender, nondistended, no masses or organomegaly Neurological - alert, oriented, normal speech, no focal findings or movement disorder noted Musculoskeletal -  As noted above, specifically her ROM of her left knee was 5-10 degrees lacking full extension and flexion only to 90 degrees Extremities - peripheral pulses normal, no pedal edema, no clubbing or cyanosis Skin - normal coloration and turgor, no rashes, no suspicious skin lesions noted   Asessment/Plan--- 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

## 2013-09-21 NOTE — Brief Op Note (Signed)
09/21/2013  12:27 PM  PATIENT:  Phyllis Knight  72 y.o. female  PRE-OPERATIVE DIAGNOSIS:  LEFT KNEE ARTHROFIBROSIS following left total knee replacement  POST-OPERATIVE DIAGNOSIS:  LEFT KNEE ARTHROFIBROSIS following left total knee replacement  PROCEDURE:  Procedure(s): CLOSED MANIPULATION LEFT KNEE (Left)  SURGEON:  Surgeon(s) and Role:    * Shelda Pal, MD - Primary  PHYSICIAN ASSISTANT: None   ANESTHESIA:   regional and general  EBL:     BLOOD ADMINISTERED:none  DRAINS: none   LOCAL MEDICATIONS USED:  NONE  SPECIMEN:  No Specimen  DISPOSITION OF SPECIMEN:  N/A  COUNTS:  NO as this was a closed procedure  TOURNIQUET:  * No tourniquets in log *  DICTATION: .Other Dictation: Dictation Number [id number]  PLAN OF CARE: Discharge to home after PACU  PATIENT DISPOSITION:  PACU - hemodynamically stable.   Delay start of Pharmacological VTE agent (>24hrs) due to surgical blood loss or risk of bleeding: not applicable

## 2013-09-21 NOTE — Anesthesia Preprocedure Evaluation (Addendum)
Anesthesia Evaluation  Patient identified by MRN, date of birth, ID band Patient awake    Reviewed: Allergy & Precautions, H&P , NPO status , Patient's Chart, lab work & pertinent test results, reviewed documented beta blocker date and time   Airway Mallampati: II TM Distance: >3 FB Neck ROM: full    Dental no notable dental hx. (+) Teeth Intact and Dental Advisory Given   Pulmonary neg pulmonary ROS,  breath sounds clear to auscultation  Pulmonary exam normal       Cardiovascular hypertension, Pt. on medications and Pt. on home beta blockers Rhythm:regular Rate:Normal  History bradycardia   Neuro/Psych Cervical fusion negative neurological ROS  negative psych ROS   GI/Hepatic negative GI ROS, Neg liver ROS, hiatal hernia, GERD-  Medicated and Controlled,  Endo/Other  negative endocrine ROS  Renal/GU negative Renal ROS  negative genitourinary   Musculoskeletal  (+) Fibromyalgia -  Abdominal   Peds  Hematology negative hematology ROS (+)   Anesthesia Other Findings   Reproductive/Obstetrics negative OB ROS                         Anesthesia Physical Anesthesia Plan  ASA: II  Anesthesia Plan: General   Post-op Pain Management:    Induction: Intravenous  Airway Management Planned: Mask  Additional Equipment:   Intra-op Plan:   Post-operative Plan:   Informed Consent: I have reviewed the patients History and Physical, chart, labs and discussed the procedure including the risks, benefits and alternatives for the proposed anesthesia with the patient or authorized representative who has indicated his/her understanding and acceptance.   Dental Advisory Given  Plan Discussed with: CRNA and Surgeon  Anesthesia Plan Comments:       Anesthesia Quick Evaluation

## 2013-09-21 NOTE — Anesthesia Procedure Notes (Signed)
Anesthesia Regional Block:  Interscalene brachial plexus block  Pre-Anesthetic Checklist: ,, timeout performed, Correct Patient, Correct Site, Correct Laterality, Correct Procedure, Correct Position, site marked, Risks and benefits discussed,  Surgical consent,  Pre-op evaluation,  At surgeon's request and post-op pain management  Laterality: Left  Prep: chloraprep       Needles:  Injection technique: Single-shot  Needle Type: Echogenic Stimulator Needle     Needle Length:cm 9 cm Needle Gauge: 21 and 21 G    Additional Needles:  Procedures: ultrasound guided (picture in chart) Interscalene brachial plexus block  Nerve Stimulator or Paresthesia:  Response: 0.5 mA,   Additional Responses:   Narrative:  Start time: 09/21/2013 12:05 PM End time: 09/21/2013 12:15 PM Injection made incrementally with aspirations every 5 mL.  Performed by: Personally  Anesthesiologist: Gaetano Hawthorne MD  Additional Notes: Patient tolerated the procedure well without complications  Interscalene brachial plexus block

## 2013-09-23 NOTE — Op Note (Signed)
NAME:  Phyllis Knight, Phyllis Knight              ACCOUNT NO.:  [id number]  MEDICAL RECORD NO.:  [id number]  LOCATION:                                 FACILITY:  PHYSICIAN:  Madlyn Frankel. Charlann Boxer, M.D.  DATE OF BIRTH:  [birth year]  DATE OF PROCEDURE:  09/21/2013 DATE OF DISCHARGE:                              OPERATIVE REPORT   PREOPERATIVE DIAGNOSIS:  Left knee arthrofibrosis following total knee arthroplasty with persistent pain and stiffness.  POSTOPERATIVE DIAGNOSIS:  Left knee arthrofibrosis following total knee arthroplasty with persistent pain and stiffness.  PROCEDURE:  Manipulation under anesthesia, left knee.  SURGEON:  Madlyn Frankel. Charlann Boxer, MD  ASSISTANT:  Surgical team.  ANESTHESIA:  General plus preoperative regional femoral nerve block.  SPECIMENS:  None.  COMPLICATION:  None.  INDICATIONS FOR PROCEDURE:  Ms. Phyllis Knight is a 72 year old female, currently about 7 weeks out from a left total knee arthroplasty when she was last seen in the office.  She noted to have stalled out on her range of motion efforts with lacking 5-10 degrees of full extension, passively flexing to about 90 degrees.  This only represented about a 10 degree improvement in her motion.  Given this, I discussed with her that it may be in her best interest to proceed with a manipulation, and so we can better identify her limitations, but also to try to improve her functional motion as well as pain relief.  Consent was obtained after viewing specific risks of persistent pain, stiffness, potential for injury to soft tissues as well as bone.  Consent was obtained for benefit of pain relief.  PROCEDURE IN DETAIL:  The patient was brought to operative theater. Once adequate anesthesia was established, including the region from femoral nerve block and general anesthetic which was done through an IV sedation.  Time-out was performed identifying the patient, planned procedure, and extremity.  I examined the knee finding this  passive range of motion of 5-10 degrees of passive extension and flexion done less than 90 degrees.  At this point, I began the procedure.  I first worked on extension placing her heel onto my shoulder, applied gentle pressure around the distal femur.  I was able to lyse adhesions in extension to give full extension, though bit difficult to fully determine based on the posterior leg and thigh adipose tissue, however, did not improve.  I then flexed the knee, and applied a pressure along the proximal tibia and was able to lyse some adhesions to get her leg flexed about 100-110 degrees.  I felt that I was limited at this point with her soft tissue on the posterior calf impinging on her posterior thigh.  I did apply gentle stretch across this area to try to improve this motion.  When I felt that at this point, had maximized our flexion and the procedure concluded.  A knee immobilizer was placed in the leg due to a femoral nerve block to prevent concerns with ambulation with decreased quad function.  Findings will be then reviewed with her husband.  She will see me back in the office in 2-3 weeks and we will work on getting a home CPM machine as well as a maintaining and  continuing physical therapy to our office to maximize these results.     Madlyn Frankel Charlann Boxer, M.D.     MDO/MEDQ  D:  09/21/2013  T:  09/22/2013  Job:  [id number]

## 2013-09-27 ENCOUNTER — Encounter (HOSPITAL_BASED_OUTPATIENT_CLINIC_OR_DEPARTMENT_OTHER): Payer: Self-pay | Admitting: Orthopedic Surgery

## 2014-05-19 ENCOUNTER — Emergency Department (HOSPITAL_COMMUNITY): Payer: Medicare Other

## 2014-05-19 ENCOUNTER — Emergency Department (HOSPITAL_COMMUNITY)
Admission: EM | Admit: 2014-05-19 | Discharge: 2014-05-20 | Disposition: A | Discharge status: Home / Self Care | Payer: Medicare Other | Attending: Emergency Medicine | Admitting: Emergency Medicine

## 2014-05-19 ENCOUNTER — Encounter (HOSPITAL_COMMUNITY): Payer: Self-pay | Admitting: Emergency Medicine

## 2014-05-19 DIAGNOSIS — K8000 Calculus of gallbladder with acute cholecystitis without obstruction: Secondary | ICD-10-CM | POA: Insufficient documentation

## 2014-05-19 DIAGNOSIS — Z8542 Personal history of malignant neoplasm of other parts of uterus: Secondary | ICD-10-CM | POA: Insufficient documentation

## 2014-05-19 DIAGNOSIS — K8010 Calculus of gallbladder with chronic cholecystitis without obstruction: Secondary | ICD-10-CM

## 2014-05-19 DIAGNOSIS — M129 Arthropathy, unspecified: Secondary | ICD-10-CM | POA: Insufficient documentation

## 2014-05-19 DIAGNOSIS — Z7982 Long term (current) use of aspirin: Secondary | ICD-10-CM | POA: Insufficient documentation

## 2014-05-19 DIAGNOSIS — I10 Essential (primary) hypertension: Secondary | ICD-10-CM | POA: Insufficient documentation

## 2014-05-19 DIAGNOSIS — K219 Gastro-esophageal reflux disease without esophagitis: Secondary | ICD-10-CM | POA: Insufficient documentation

## 2014-05-19 DIAGNOSIS — K819 Cholecystitis, unspecified: Secondary | ICD-10-CM | POA: Insufficient documentation

## 2014-05-19 MED ORDER — FENTANYL CITRATE 0.05 MG/ML IJ SOLN
50.0000 ug | Freq: Once | INTRAMUSCULAR | Status: AC
  Administered 2014-05-20: 50 ug via INTRAVENOUS
  Filled 2014-05-19: qty 2

## 2014-05-19 MED ORDER — ONDANSETRON HCL 4 MG/2ML IJ SOLN
4.0000 mg | Freq: Once | INTRAMUSCULAR | Status: AC
  Administered 2014-05-20: 4 mg via INTRAVENOUS
  Filled 2014-05-19: qty 2

## 2014-05-19 NOTE — ED Notes (Signed)
IV attempt x1 L forearm, unsuccessful. Requested second nurse to attempt IV access.

## 2014-05-19 NOTE — ED Notes (Signed)
Patient is alert and oriented x3.  She is complaining abdominal pain that started last night. Currently she rates her pain 8 of 10 without any nausea, vomiting or diarrhea.  Patient states That she is normally healthy and the is something new.

## 2014-05-19 NOTE — ED Notes (Signed)
Patient c/o generalized abd pain and chest pain throughout today, starting this AM. Described as a "steady" pain. Patient denies any N/V/D, denies SOB. Reports hx of abd pain, but nothing similar to this. BMx2 today, "kinda hard". Tenderness to lower abd

## 2014-05-20 ENCOUNTER — Emergency Department (HOSPITAL_COMMUNITY): Payer: Medicare Other

## 2014-05-20 ENCOUNTER — Encounter (HOSPITAL_COMMUNITY): Payer: Self-pay

## 2014-05-20 DIAGNOSIS — R079 Chest pain, unspecified: Secondary | ICD-10-CM

## 2014-05-20 DIAGNOSIS — R109 Unspecified abdominal pain: Secondary | ICD-10-CM

## 2014-05-20 DIAGNOSIS — K8020 Calculus of gallbladder without cholecystitis without obstruction: Secondary | ICD-10-CM

## 2014-05-20 LAB — COMPREHENSIVE METABOLIC PANEL
ALT: 9 U/L (ref 0–35)
AST: 19 U/L (ref 0–37)
Albumin: 3.9 g/dL (ref 3.5–5.2)
Alkaline Phosphatase: 98 U/L (ref 39–117)
BUN: 10 mg/dL (ref 6–23)
CO2: 23 mEq/L (ref 19–32)
Calcium: 9.4 mg/dL (ref 8.4–10.5)
Chloride: 101 mEq/L (ref 96–112)
Creatinine, Ser: 0.48 mg/dL — ABNORMAL LOW (ref 0.50–1.10)
GFR calc Af Amer: >90 mL/min (ref >90)
GFR calc non Af Amer: >90 mL/min (ref >90)
Glucose, Bld: 102 mg/dL — ABNORMAL HIGH (ref 70–99)
Potassium: 4.5 mEq/L (ref 3.7–5.3)
Sodium: 137 mEq/L (ref 137–147)
Total Bilirubin: 0.6 mg/dL (ref 0.3–1.2)
Total Protein: 8.3 g/dL (ref 6.0–8.3)

## 2014-05-20 LAB — CBC WITH DIFFERENTIAL/PLATELET
Basophils Absolute: 0 10*3/uL (ref 0.0–0.1)
Basophils Relative: 0 % (ref 0–1)
Eosinophils Absolute: 0 10*3/uL (ref 0.0–0.7)
Eosinophils Relative: 1 % (ref 0–5)
HCT: 40.6 % (ref 36.0–46.0)
Hemoglobin: 13.7 g/dL (ref 12.0–15.0)
Lymphocytes Relative: 15 % (ref 12–46)
Lymphs Abs: 0.9 10*3/uL (ref 0.7–4.0)
MCH: 30.8 pg (ref 26.0–34.0)
MCHC: 33.7 g/dL (ref 30.0–36.0)
MCV: 91.2 fL (ref 78.0–100.0)
Monocytes Absolute: 0.3 10*3/uL (ref 0.1–1.0)
Monocytes Relative: 5 % (ref 3–12)
Neutro Abs: 4.9 10*3/uL (ref 1.7–7.7)
Neutrophils Relative %: 79 % — ABNORMAL HIGH (ref 43–77)
Platelets: 195 10*3/uL (ref 150–400)
RBC: 4.45 MIL/uL (ref 3.87–5.11)
RDW: 12.3 % (ref 11.5–15.5)
WBC: 6.1 10*3/uL (ref 4.0–10.5)

## 2014-05-20 LAB — URINALYSIS, ROUTINE W REFLEX MICROSCOPIC
Bilirubin Urine: NEGATIVE
Glucose, UA: NEGATIVE mg/dL
Hgb urine dipstick: NEGATIVE
Ketones, ur: NEGATIVE mg/dL
Nitrite: NEGATIVE
Protein, ur: NEGATIVE mg/dL
Specific Gravity, Urine: 1.018 (ref 1.005–1.030)
Urobilinogen, UA: 1 mg/dL (ref 0.0–1.0)
pH: 7.5 (ref 5.0–8.0)

## 2014-05-20 LAB — URINE MICROSCOPIC-ADD ON

## 2014-05-20 LAB — I-STAT CG4 LACTIC ACID, ED: Lactic Acid, Venous: 1.29 mmol/L (ref 0.5–2.2)

## 2014-05-20 LAB — LIPASE, BLOOD: Lipase: 14 U/L (ref 11–59)

## 2014-05-20 MED ORDER — MORPHINE SULFATE 4 MG/ML IJ SOLN
4.0000 mg | Freq: Once | INTRAMUSCULAR | Status: AC
  Administered 2014-05-20: 4 mg via INTRAVENOUS
  Filled 2014-05-20 (×2): qty 1

## 2014-05-20 MED ORDER — ONDANSETRON 8 MG PO TBDP: 8.0000 mg | ORAL_TABLET | Freq: Three times a day (TID) | ORAL | Status: DC | PRN

## 2014-05-20 MED ORDER — MORPHINE SULFATE 2 MG/ML IJ SOLN
2.0000 mg | Freq: Once | INTRAMUSCULAR | Status: AC
  Administered 2014-05-20: 2 mg via INTRAVENOUS
  Filled 2014-05-20: qty 1

## 2014-05-20 MED ORDER — IOHEXOL 300 MG/ML  SOLN
100.0000 mL | Freq: Once | INTRAMUSCULAR | Status: AC | PRN
  Administered 2014-05-20: 100 mL via INTRAVENOUS

## 2014-05-20 MED ORDER — MORPHINE SULFATE 4 MG/ML IJ SOLN: 4.0000 mg | Freq: Once | INTRAMUSCULAR | Status: AC

## 2014-05-20 MED ORDER — IOHEXOL 300 MG/ML  SOLN
50.0000 mL | Freq: Once | INTRAMUSCULAR | Status: AC | PRN
  Administered 2014-05-20: 50 mL via ORAL

## 2014-05-20 MED ORDER — FENTANYL CITRATE 0.05 MG/ML IJ SOLN
100.0000 ug | Freq: Once | INTRAMUSCULAR | Status: AC
  Administered 2014-05-20: 100 ug via INTRAVENOUS
  Filled 2014-05-20: qty 2

## 2014-05-20 MED ORDER — OXYCODONE HCL 5 MG PO TABS: 5.0000 mg | ORAL_TABLET | ORAL | Status: DC | PRN

## 2014-05-20 NOTE — Discharge Instructions (Signed)
RETURN TO THE ER FOR WORSENING PAIN, FEVERS, VOMITING, OR OTHER NEW OR CONCERNING SYMPTOMS!!     Cholecystitis Cholecystitis is an inflammation of your gallbladder. It is usually caused by a buildup of gallstones or sludge (cholelithiasis) in your gallbladder. The gallbladder stores a fluid that helps digest fats (bile). Cholecystitis is serious and needs treatment right away.  CAUSES   Gallstones. Gallstones can block the tube that leads to your gallbladder, causing bile to build up. As bile builds up, the gallbladder becomes inflamed.  Bile duct problems, such as blockage from scarring or kinking.  Tumors. Tumors can stop bile from leaving your gallbladder correctly, causing bile to build up. As bile builds up, the gallbladder becomes inflamed. SYMPTOMS   Nausea.  Vomiting.  Abdominal pain, especially in the upper right area of your abdomen.  Abdominal tenderness or bloating.  Sweating.  Chills.  Fever.  Yellowing of the skin and the whites of the eyes (jaundice). DIAGNOSIS  Your caregiver may order blood tests to look for infection or gallbladder problems. Your caregiver may also order imaging tests, such as an ultrasound or computed tomography (CT) scan. Further tests may include a hepatobiliary iminodiacetic acid (HIDA) scan. This scan allows your caregiver to see your bile move from the liver to the gallbladder and to the small intestine. TREATMENT  A hospital stay is usually necessary to lessen the inflammation of your gallbladder. You may be required to not eat or drink (fast) for a certain amount of time. You may be given medicine to treat pain or an antibiotic medicine to treat an infection. Surgery may be needed to remove your gallbladder (cholecystectomy) once the inflammation has gone down. Surgery may be needed right away if you develop complications such as death of gallbladder tissue (gangrene) or a tear (perforation) of the gallbladder.  HOME CARE INSTRUCTIONS    Home care will depend on your treatment. In general:  If you were given antibiotics, take them as directed. Finish them even if you start to feel better.  Only take over-the-counter or prescription medicines for pain, discomfort, or fever as directed by your caregiver.  Follow a low-fat diet until you see your caregiver again.  Keep all follow-up visits as directed by your caregiver. SEEK IMMEDIATE MEDICAL CARE IF:   Your pain is increasing and not controlled by medicines.  Your pain moves to another part of your abdomen or to your back.  You have a fever.  You have nausea and vomiting. MAKE SURE YOU:  Understand these instructions.  Will watch your condition.  Will get help right away if you are not doing well or get worse. Document Released: 11/18/2005 Document Revised: 02/10/2012 Document Reviewed: 10/04/2011 [facility] Patient Information 2015 [place], [place]. This information is not intended to replace advice given to you by your health care provider. Make sure you discuss any questions you have with your health care provider.  Cholelithiasis Cholelithiasis (also called gallstones) is a form of gallbladder disease in which gallstones form in your gallbladder. The gallbladder is an organ that stores bile made in the liver, which helps digest fats. Gallstones begin as small crystals and slowly grow into stones. Gallstone pain occurs when the gallbladder spasms and a gallstone is blocking the duct. Pain can also occur when a stone passes out of the duct.  RISK FACTORS  Being female.   Having multiple pregnancies. Health care providers sometimes advise removing diseased gallbladders before future pregnancies.   Being obese.  Eating a diet heavy in  fried foods and fat.   Being older than 60 years and increasing age.   Prolonged use of medicines containing female hormones.   Having diabetes mellitus.   Rapidly losing weight.   Having a family history of  gallstones (heredity).  SYMPTOMS  Nausea.   Vomiting.  Abdominal pain.   Yellowing of the skin (jaundice).   Sudden pain. It may persist from several minutes to several hours.  Fever.   Tenderness to the touch. In some cases, when gallstones do not move into the bile duct, people have no pain or symptoms. These are called "silent" gallstones.  TREATMENT Silent gallstones do not need treatment. In severe cases, emergency surgery may be required. Options for treatment include:  Surgery to remove the gallbladder. This is the most common treatment.  Medicines. These do not always work and may take 6-12 months or more to work.  Shock wave treatment (extracorporeal biliary lithotripsy). In this treatment an ultrasound machine sends shock waves to the gallbladder to break gallstones into smaller pieces that can pass into the intestines or be dissolved by medicine. HOME CARE INSTRUCTIONS   Only take over-the-counter or prescription medicines for pain, discomfort, or fever as directed by your health care provider.   Follow a low-fat diet until seen again by your health care provider. Fat causes the gallbladder to contract, which can result in pain.   Follow up with your health care provider as directed. Attacks are almost always recurrent and surgery is usually required for permanent treatment.  SEEK IMMEDIATE MEDICAL CARE IF:   Your pain increases and is not controlled by medicines.   You have a fever or persistent symptoms for more than 2-3 days.   You have a fever and your symptoms suddenly get worse.   You have persistent nausea and vomiting.  MAKE SURE YOU:   Understand these instructions.  Will watch your condition.  Will get help right away if you are not doing well or get worse. Document Released: 11/14/2005 Document Revised: 07/21/2013 Document Reviewed: 05/12/2013 [facility] Patient Information 2015 [place], [place]. This information is not intended to  replace advice given to you by your health care provider. Make sure you discuss any questions you have with your health care provider.  Gallbladder  Take medications as prescribed.  Call the surgery clinic later today to schedule a follow up visit for your gallstones.  Return to the ER for worsening pain, vomiting despite medications, fever, or other new concerning symptoms.   PAIN OXYCODONE  PAIN  OXYCODONE: You have been given a medication that contains acetaminophen and oxycodone.      This medication is used to relieve pain.     DO NOT take this medication if you have liver disease or drink alcohol on a daily basis.     DO NOT take this medication if you are taking other over-the-counter medications that contain Tylenol or acetaminophen (the active ingredient in Tylenol).     If you have side-effects that you think are caused by this medicine, tell your doctor.     DO NOT drink alcoholic beverages while taking this medicine.     If you become dizzy, sit or lie down at the first signs.  You should be careful going up and down stairs.     If you are pregnant or breastfeeding, notify your doctor before taking this medication.     Keep this medication out of the reach of children.  Always keep this medication in child-proof containers.  DO NOT give your medication to anyone else. This medication can be HABIT-FORMING.  Discontinue use when no longer needed and never give this medication to others.  You have been given a medication, or a prescription for a medication, that causes drowsiness or dizziness.  DO NOT drive a car, operate machinery, or perform jobs that require you to be alert until you know how you are going to react to this medicine.  THESE INSTRUCTIONS ARE NOT COMPREHENSIVE (complete):  Ask your pharmacist for additional information and precautions for this medication.   GI ANTIEMETIC  GI ANTIEMETIC: You have been given a prescription for a medication for nausea and  vomiting.      It is OK to take this medication if you are pregnant.  Be sure to tell your regular doctor or obstetrician [facility](OB doctor) that you have been taking this medication.     Take this medication as directed.     If you are taking phenobarbital, narcotic pain medications, antidepressants, or sleeping pills your dosage may need to be adjusted.  Be sure to inform your doctor of all the other medications that you are taking.     DO NOT take this medication if you have liver disease or heart disease.     DO NOT take pain killers (narcotic medication) unless specifically instructed to do so by your doctor     DO NOT drink alcoholic beverages while taking this medicine.     If you develop any reactions that you believe may be from the medication be sure to tell your doctor or return to the ER (Some reactions may include:  dizziness, shaking, visual disturbances, nervousness, fainting, rash).     If you become dizzy, sit or lie down at the first signs.  You should be careful going up and down stairs.     Keep this medication out of the reach of children.  Always keep this medication in child-proof containers.  DO NOT give your medication to anyone else. You have been given a medication, or a prescription for a medication, that causes drowsiness or dizziness.  DO NOT drive a car, operate machinery, ride a bike, or perform jobs that require you to be alert until you know how you are going to react to this medication.  THESE INSTRUCTIONS ARE NOT COMPREHENSIVE (complete):  Ask your pharmacist for additional information and precautions for this medication.   LOW FAT DIET  Low Fat Diet  Your doctor wants you to be on a low fat diet.  This diet will be helpful if you want to lose weight or if you have problems with your liver, pancreas, gallbladder.  FOODS ALLOWED:    Beverages: All, except those not allowed.   Evaporated skim milk.     Breads: All enriched or whole grain bread, bread  sticks, graham crackers, melba toast, pretzels, rye wafers, matzoh, saltines, bagels.     Cereals: All cooked without fat or dry.     Desserts: All fruit, diet puddings, gelatin, dessert made with egg white, angel food cake, fruit ice, sherbet.     Eggs: Not more than one egg yolk daily, whites okay.  Cholesterol free egg substitutes, such as "egg beaters."     Fat: One teaspoon each meal or 3 teaspoons per day; butter, mayonnaise, margarine, oil. (May be used in cooking if omitted at meals) Fat free salad dressings and gravy.     Fruits: All fresh, frozen, or canned fruit or fruit juice. One citrus fruit  every day.     Meats, Fish, Poultry & Cheese: Remove visible fat from meat before cooking. Baked, broiled, boiled, roasted, stewed, simmered; lean fish, meat, poultry, seafood.  Water packed salmon and tuna.  Lowfat cottage cheese, skim milk cheese, ricotta, parmesan, farmer's cheese, lowfat yogurt and tofu.     Potatoes & Substitutes: Macaroni, noodles, rice, spaghetti, sweet or white potato; prepared without fat, unless used in amount allowed.     Soups: Bouillon, cream soups made with vegetables and skim milk, fat free meat and poultry soups.     Sweets: Honey, jam, jelly, marshmallows, molasses, sugar, syrup, candies; hard, Life Savers, gum drops, jelly beans, sour balls.     Vegetables: All fresh, frozen, canned or juiced vegetables allowed.  FOODS NOT ALLOWED:    Beverages: Cream, 2% or whole milk, chocolate milk, condensed milk, evaporated or malted milk or shakes, 1/2 & 1/2.     Breads: Quick breads, muffins, biscuits, pancakes, corn bread, sweet rolls, any fried breads.     Cereals: Bran, if it causes distress, wheat germ.     Desserts: Dessert made with whole milk, cream, butter, lard, oil, coconut, nuts, or chocolate.     Eggs: Eggs prepared with whole milk or fat. Fried eggs.     Fat: More than 1 teaspoon per meal.  Bacon, bacon fat, ham fat, lard, salt pork, shortening,  gravy, salad dressings, non-dairy creamers.     Fruits: [place].  Any fruit that causes distress.     Meats, Fish, Armed forces training and education officeroultry & Cheese: All fried or fatty.  Sausage, prime rib, frankfurters, luncheon meats, fish canned in oil, duck, goose, poultry skin, spiced or pickled meats, cheese (except those allowed), whole milk yogurt.  Peanut butter limited to 1 Tbs. day.     Potatoes & Substitutes: Cooked with fat or oil, fried potatoes, potato chips, cream sauces (unless made with skim milk).     Sweets: Chocolate, coconut, nuts, caramels.                Vegetables: Avocado, any cooked in fat or that cause distress.    BILIARY COLIC  BILIARY COLIC: You have been diagnosed with biliary colic.  Biliary colic is the term used to describe crampy pain from a gallbladder that contains gallstones.  The gallbladder is a small sack that hangs from the liver. It stores a liquid called bile. Bile is produced by the liver. When you eat, the gallbladder squeezes bile through a duct or tube into the intestines to help with the digestion of fat.  Gallstones develop from crystals of bile. The stone may be smaller than a pea, or as large as a golf ball. There may be one or several stones. The stone may block the tube that drains the bile from the gallbladder. This blockage causes spasm of the gallbladder and pain.  The pain usually comes and goes and is crampy.  It often starts after eating foods that contain a lot of fat.  You may also have nausea and vomiting with the pain.  Biliary colic is usually treated with pain medications. You may also be given a medication for the nausea and vomiting.  You should avoid eating foods that are fried or contain a lot of fat.  If you have more episodes of pain, you may need to have your gall bladder removed.      You should contact your family doctor for a referral to a general surgeon. YOU SHOULD SEEK MEDICAL ATTENTION IMMEDIATELY, EITHER HERE  OR AT THE NEAREST  EMERGENCY DEPARTMENT, IF ANY OF THE FOLLOWING OCCURS:      Increasing pain or pain that does not go away.     Persistent vomiting or if you are not able to keep any fluids down.     Fever or shaking chills.     Yellowing of your skin or eyes, or dark, brown-colored urine.  If you develop symptoms of Shortness of Breath, Chest Pain, Swelling of lips, mouth or tongue or if your condition becomes worse with any new symptoms, see your doctor or return to the Emergency Department for immediate care. Emergency services are not intended to be a substitute for comprehensive medical attention.  Please contact your doctor for follow up if not improving as expected.   Call your doctor in 5-7 days or as directed if there is no improvement.   Community Resources: *IF YOU ARE IN IMMEDIATE DANGER CALL 911!  Abuse/Neglect:  Family Services Crisis Hotline [facility]): 530 492 2841 [facility] [place]): ([phone]) [phone]  After hours, holidays and weekends: 515-834-9885 National Domestic Violence Hotline: 586-642-6765  Mental Health: [facility] Mental [facility]: [place]: [phone]  Health Clinics:  [facility]): [phone] Monday - Friday 8 AM - 9 PM, Saturday and Sunday 10 AM - 9 PM  [facility] [place]: [phone] Monday - Friday 8 AM - 5 PM  [facility]  [address]: [phone] Monday- Friday 8:30 AM - 5:30 PM, Sat 9 AM - 1 PM  [facility] [address]: [phone] [facility] [address]: [phone] [facility] [address]: [phone]  [facility]: [address] [phone]  Cultures: If culture results are positive, we will notify you if a change in treatment is necessary.  LABORATORY TESTS:         If you had any labs drawn in the ED that have not resulted by the time you are discharged home, we will review these  lab results and the treatment given to you.  If there is any further treatment or notification needed, we will contact you by phone, or letter.  "PLEASE ENSURE THAT YOU HAVE GIVEN US YOUR CURRENT WORKING PHONE NUMBER AND YOUR CURRENT ADDRESS, so that we can contact you if needed."  RADIOLOGY TESTS:  If the referred physician wants todays x-rays, please call the hospitals Radiology Department the day before your doctors appointment. [place]     [phone] [place]   [phone] [place]     [phone]  Our doctors and staff appreciate your choosing [place] for your emergency medical care needs. We are here to serve you.

## 2014-05-20 NOTE — ED Notes (Signed)
Went in the room, dr. Ezzard StandingNewman at bedside.  He states that pt is declining hospital admission at this time.  Gave pt instructions to call his office whenever they decide to have the surgery done.  [facility] EDP made aware

## 2014-05-20 NOTE — ED Provider Notes (Addendum)
CSN: [id number]     Arrival date & time 05/19/14  2218 History   First MD Initiated Contact with Patient 05/19/14 2305     No chief complaint on file.    (Consider location/radiation/quality/duration/timing/severity/associated sxs/prior Treatment) HPI 73 year old female presents to emergency room from home with complaint of diffuse abdominal pain ongoing for the last 2 days.  Pain is worsened throughout the day today.  No nausea vomiting diarrhea or fever.  She has had 2 bowel movements today.  Pain is a dull constant ache.  She reports this morning she had radiation of pain into her chest.  Patient status post hysterectomy, denies any other abdominal surgeries.  No prior history of similar pain. Past Medical History  Diagnosis Date  . Arthritis   . Fibromyalgia   . Hypertension   . GERD (gastroesophageal reflux disease)   . H/O hiatal hernia   . Bradycardia   . History of uterine cancer     s/p hysterectomy  . 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 Urge urinary incontinence    Past Surgical History  Procedure Laterality Date  . Tympanoplasty Left   . Total knee arthroplasty Left 08/03/2013    Procedure: LEFT TOTAL KNEE ARTHROPLASTY;  Surgeon: Shelda PalMatthew D Olin, MD;  Location: [facility];  Service: Orthopedics;  Laterality: Left;  . Total abdominal hysterectomy w/ bilateral salpingoophorectomy  1972  . Tracheotomy    . Cervical fusion  2011  . Knee closed reduction Left 09/21/2013    Procedure: CLOSED MANIPULATION LEFT KNEE;  Surgeon: Shelda PalMatthew D Olin, MD;  [facility];  Service: Orthopedics;  Laterality: Left;   History reviewed. No pertinent family history. History  Substance Use Topics  . Smoking status: Never Smoker   . Smokeless tobacco: Never Used  . Alcohol Use: No   OB History   Grav Para Term Preterm Abortions TAB SAB Ect Mult Living                 Review of Systems  See History of Present Illness; otherwise all other  systems are reviewed and negative   Allergies  Codeine  Home Medications   Prior to Admission medications   Medication Sig Start Date End Date Taking? Authorizing Provider  amLODipine (NORVASC) 10 MG tablet Take 10 mg by mouth every morning.   Yes Historical Provider, MD  aspirin EC 325 MG tablet Take 325 mg by mouth daily.   Yes Historical Provider, MD  atenolol (TENORMIN) 25 MG tablet Take 12.5 mg by mouth every morning.   Yes Historical Provider, MD  benazepril (LOTENSIN) 20 MG tablet Take 20 mg by mouth every morning.   Yes Historical Provider, MD  cholecalciferol (VITAMIN D) 1000 UNITS tablet Take 1,000 Units by mouth daily.   Yes Historical Provider, MD  ferrous sulfate 325 (65 FE) MG tablet Take 1 tablet (325 mg total) by mouth 3 (three) times daily after meals. 08/04/13  Yes Genelle GatherMatthew Scott Babish, PA-C  omeprazole (PRILOSEC) 20 MG capsule Take 20 mg by mouth every morning.    Yes Historical Provider, MD  pregabalin (LYRICA) 100 MG capsule Take 100 mg by mouth 3 (three) times daily.   Yes Historical Provider, MD   BP 160/62  Pulse 69  Temp(Src) 98.4 F (36.9 C) (Oral)  Resp 19  SpO2 100% Physical Exam  Nursing note and vitals reviewed. Constitutional: She is oriented to person, place, and time. She appears well-developed and well-nourished.  She appears distressed.  HENT:  Head: Normocephalic and atraumatic.  Right Ear: External ear normal.  Left Ear: External ear normal.  Nose: Nose normal.  Mouth/Throat: Oropharynx is clear and moist.  Eyes: Conjunctivae and EOM are normal. Pupils are equal, round, and reactive to light.  Neck: Normal range of motion. Neck supple. No JVD present. No tracheal deviation present. No thyromegaly present.  Cardiovascular: Normal rate, regular rhythm, normal heart sounds and intact distal pulses.  Exam reveals no gallop and no friction rub.   No murmur heard. Pulmonary/Chest: Effort normal and breath sounds normal. No stridor. No respiratory  distress. She has no wheezes. She has no rales. She exhibits no tenderness.  Abdominal: Soft. She exhibits no distension and no mass. There is tenderness. There is no rebound and no guarding.  Decreased bowel sounds, diffuse tenderness throughout with palpation  Musculoskeletal: Normal range of motion. She exhibits no edema and no tenderness.  Lymphadenopathy:    She has no cervical adenopathy.  Neurological: She is alert and oriented to person, place, and time. She exhibits normal muscle tone. Coordination normal.  Skin: Skin is warm and dry. No rash noted. No erythema. No pallor.  Psychiatric: She has a normal mood and affect. Her behavior is normal. Judgment and thought content normal.    ED Course  Procedures (including critical care time) Labs Review Labs Reviewed  CBC WITH DIFFERENTIAL - Abnormal; Notable for the following:    Neutrophils Relative % 79 (*)    All other components within normal limits  COMPREHENSIVE METABOLIC PANEL - Abnormal; Notable for the following:    Glucose, Bld 102 (*)    Creatinine, Ser 0.48 (*)    All other components within normal limits  URINALYSIS, ROUTINE W REFLEX MICROSCOPIC - Abnormal; Notable for the following:    Leukocytes, UA TRACE (*)    All other components within normal limits  URINE CULTURE  LIPASE, BLOOD  URINE MICROSCOPIC-ADD ON  I-STAT CG4 LACTIC ACID, ED    Imaging Review No results found.   EKG Interpretation   Date/Time:  Thursday May 19 2014 22:31:47 EDT Ventricular Rate:  69 PR Interval:  153 QRS Duration: 102 QT Interval:  417 QTC Calculation: 447 R Axis:     Text Interpretation:  Sinus rhythm Paired ventricular premature complexes  Abnormal R-wave progression, early transition No acute changes Confirmed  by Rhunette CroftNANAVATI, MD, Janey GentaANKIT [phone]([id number]) on 05/19/2014 10:59:41 PM      MDM   Final diagnoses:  Cholecystitis  Cholelithiasis with cholecystitis of other acuity without biliary obstruction   73 year old female with  diffuse abdominal pain, radiation of the chest.  EKG shows frequent PVCs.  Plan for labs, CT abdomen pelvis, pain and nausea control.    Olivia Mackielga M Otter, MD 05/20/14 0417  6:48 AM Pt with cholelithiasis and probable cholecystitis.  Pt was d/w Dr Ezzard StandingNewman from surgery, pt and family updated on findings and plan for surgery consult and probable laproscopic surgery.  Pt now is unsure if she wants surgery, wishes to speak with her pcm.  Dr Ezzard StandingNewman ok with dc, recommends pain medication and holding on abx outpatient rx.  They have his information.  Olivia Mackielga M Otter, MD 05/20/14 [phone]

## 2014-05-20 NOTE — H&P (Signed)
Re:   Phyllis Knight DOB:   July 19, 1941 MRN:   [id number]  [facility] Consultation  ASSESSMENT AND PLAN: 1.  Gall bladder disease  I discussed with the patient the indications and risks of gall bladder surgery.  The primary risks of gall bladder surgery include, but are not limited to, bleeding, infection, common bile duct injury, and open surgery.  There is also the risk that the patient may have continued symptoms after surgery.  However, the likelihood of improvement in symptoms and return to the patient's normal status is good. We discussed the typical post-operative recovery course. I tried to answer the patient's questions.  At this time, the patient does not want to be admitted. She wants to talk to Dr. Katrinka BlazingSmith about possible surgery.  I gave her our phone number as she reconsiders her plans.  2.  Arthritis  Left knee replacement - Sept 2014 - Oliin  Still has swelling in the knee 3.  Fibromyalgia 4.  GERD 5.  HTN x 30 years 6.  There is a note about uterine cancer in the chart, but the patient denies this  She had a hysterectomy in 1972 for bleeding 7.  Diverticulosis 8.  Left ventral hernia  This is through the lower midline scar and I do not feel much on PE. 9.  Umbilical hernia 10.  History of trach for pneumonia in 1968.  Chief Complaint  Patient presents with  . Chest Pain  . Abdominal Pain   REFERRING PHYSICIAN:  Dr Norlene Campbelltter, [facility]  HISTORY OF PRESENT ILLNESS: Phyllis Knight is a 73 y.o. (DOB: July 19, 1941)  AA  female whose primary care physician is Allean FoundSMITH,CANDACE THIELE, MD and [facility]. Her husband is at the bed side.  She has had vague abdominal pain that last for a short period of time over the last few months. She thought it was just indigestion.  She does have GERD.  She had a colonoscopy last year, but is unsure of who did the procedure.  She had a hysterectomy for bleeding in 1972. Yesterday AM, 05/19/2014, she developed severe  abdominal pain. Dr. Michaelle CopasSmith's office told her to go to the ER.  US - 05/20/2014 - 1. Cholelithiasis, with stones lodged at the neck of the gallbladder, and underlying sludge. Gallbladder wall thickening and positive ultrasonographic Murphy's sign. Findings suspicious for acute cholecystitis.   2. Suggestion of mild fatty infiltration within the liver. CT abdomen - 05/20/2014 - 1. Mild wall thickening and/ or induration about the gallbladder with suggestion of stones and/or sludge within the gallbladder  lumen. Findings may represent sequela of acute cholecystitis in the correct clinical setting.   2. Colonic diverticulosis without acute diverticulitis.   3. Nonobstructive fat and small bowel containing left ventral hernia.   4. Additional small fat containing paraumbilical hernia.   5. Status post hysterectomy. WBC - 05/20/2014 - 6,100, 79% Neutrophils    Past Medical History  Diagnosis Date  . Arthritis   . Fibromyalgia   . Hypertension   . GERD (gastroesophageal reflux disease)   . H/O hiatal hernia   . Bradycardia   . History of uterine cancer     s/p hysterectomy  . 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 Urge urinary incontinence       Past Surgical History  Procedure Laterality Date  . Tympanoplasty Left   . Total knee arthroplasty Left 08/03/2013  Procedure: LEFT TOTAL KNEE ARTHROPLASTY;  Surgeon: Shelda PalMatthew D Olin, MD;  Location: [facility];  Service: Orthopedics;  Laterality: Left;  . Total abdominal hysterectomy w/ bilateral salpingoophorectomy  1972  . Tracheotomy    . Cervical fusion  2011  . Knee closed reduction Left 09/21/2013    Procedure: CLOSED MANIPULATION LEFT KNEE;  Surgeon: Shelda PalMatthew D Olin, MD;  [facility];  Service: Orthopedics;  Laterality: Left;      No current facility-administered medications for this encounter.   Current Outpatient Prescriptions  Medication Sig Dispense Refill  . amLODipine (NORVASC) 10 MG  tablet Take 10 mg by mouth every morning.      Marland Kitchen. aspirin EC 325 MG tablet Take 325 mg by mouth daily.      Marland Kitchen. atenolol (TENORMIN) 25 MG tablet Take 12.5 mg by mouth every morning.      . benazepril (LOTENSIN) 20 MG tablet Take 20 mg by mouth every morning.      . cholecalciferol (VITAMIN D) 1000 UNITS tablet Take 1,000 Units by mouth daily.      . ferrous sulfate 325 (65 FE) MG tablet Take 1 tablet (325 mg total) by mouth 3 (three) times daily after meals.    3  . omeprazole (PRILOSEC) 20 MG capsule Take 20 mg by mouth every morning.       . pregabalin (LYRICA) 100 MG capsule Take 100 mg by mouth 3 (three) times daily.          Allergies  Allergen Reactions  . Codeine Nausea And Vomiting    REVIEW OF SYSTEMS: Skin:  No history of rash.  No history of abnormal moles. Infection:  No history of hepatitis or HIV.  No history of MRSA. Neurologic:  No history of stroke.  No history of seizure.  No history of headaches. Cardiac:  HTN x 30 years.  No MI Pulmonary:  Had pneumonia in 1968, required trach.  No chronic pulmonary issues.  Endocrine:  No diabetes. No thyroid disease. Gastrointestinal:  See HPI. Urologic:  No history of kidney stones.  No history of bladder infections. Musculoskeletal:  Left knee replacement by Dr. Charlann Boxerlin.  Sept 204.  Fibromyalgia x 3 years.  Somewhat better on lyrica. Hematologic:  No bleeding disorder.  No history of anemia.  Not anticoagulated. Psycho-social:  The patient is oriented.   The patient has no obvious psychologic or social impairment to understanding our conversation and plan.  SOCIAL and FAMILY HISTORY: Married.  Husband, [name], at bedside. Children:  7151 daughter, 350 daughter, 6445 son, and 3843 daughter She is retired from [employer] about 10 years ago.  PHYSICAL EXAM: BP 110/60  Pulse 60  Temp(Src) 98.4 F (36.9 C) (Oral)  Resp 16  SpO2 97%  General: WN older AA who is alert.  HEENT: Normal. Pupils equal. Neck: Supple. No mass.  No  thyroid mass. Lymph Nodes:  No supraclavicular or cervical nodes. Lungs: Clear to auscultation and symmetric breath sounds. Heart:  RRR. No murmur or rub. Abdomen:  No mass. No tenderness. No hernia. Normal bowel sounds.  Lower abdominal scar.  Questionable hernia in lower abdominal, to left of midline. Rectal: Not done. Extremities:  Swelling left knee post replacement. Neurologic:  Grossly intact to motor and sensory function. Psychiatric: Has normal mood and affect. Behavior is normal.   DATA REVIEWED: Epic notes.  Ovidio Kinavid Eyob Godlewski, MD,  [facility] [address]  [address]   [place], [place]  [address] Phone:  [phone] FAX:  [phone]

## 2014-05-20 NOTE — ED Notes (Signed)
US at bedside

## 2014-05-21 LAB — URINE CULTURE: Colony Count: 75000

## 2014-05-26 ENCOUNTER — Telehealth (INDEPENDENT_AMBULATORY_CARE_PROVIDER_SITE_OTHER): Payer: Self-pay

## 2014-05-26 NOTE — Telephone Encounter (Signed)
Pt's husband calling b/c he wants pt to be seen by Dr Ezzard StandingNewman now for the pt to get scheduled for surgery. The pt was seen in the hospital on 6/18 by Dr Ezzard StandingNewman for a consult but the pt declined to have surgery at that time till she spoke to her PCP Dr Merri Brunetteandace Smith. The pt is now starting to have more pain with the gallbladder so she wants to get scheduled. I made the pt an appt to see Dr Ezzard StandingNewman tomorrow 6/26 arrive at 11:00/11:30. They understand. I notified Glenda of the appt space that I scheduled in for the pt.

## 2014-05-27 ENCOUNTER — Ambulatory Visit (INDEPENDENT_AMBULATORY_CARE_PROVIDER_SITE_OTHER): Payer: Medicare Other | Admitting: Surgery

## 2014-05-27 ENCOUNTER — Encounter (INDEPENDENT_AMBULATORY_CARE_PROVIDER_SITE_OTHER): Payer: Self-pay | Admitting: Surgery

## 2014-05-27 VITALS — BP 142/96 | HR 64 | Temp 97.0°F | Resp 20 | Ht 66.0 in | Wt 221.6 lb

## 2014-05-27 DIAGNOSIS — K829 Disease of gallbladder, unspecified: Secondary | ICD-10-CM

## 2014-05-27 NOTE — Progress Notes (Signed)
 Re:   Phyllis Knight DOB:   03/26/1941 MRN:   [id number]  ASSESSMENT AND PLAN: 1. Gall bladder disease   I discussed with the patient the indications and risks of gall bladder surgery. The primary risks of gall bladder surgery include, but are not limited to, bleeding, infection, common bile duct injury, and open surgery. There is also the risk that the patient may have continued symptoms after surgery. However, the likelihood of improvement in symptoms and return to the patient's normal status is good. We discussed the typical post-operative recovery course. I tried to answer the patient's questions.   I gave her a book on gall bladder disease.  She has talked to Dr. Smith.  Her husband has a family reunion on 7/4, so she wants to wait until after that date to have surgery  2. Arthritis   Left knee replacement - Sept 2014 - Oliin   Still has swelling in the knee  3. Fibromyalgia  4. GERD  5. HTN x 30 years  6. There is a note about uterine cancer in the chart, but the patient denies this   She had a hysterectomy in 1972 for bleeding  7. Diverticulosis  8. Left ventral hernia   This is through the lower midline scar and I do not feel much on PE.  9. Umbilical hernia  10. History of trach for pneumonia in 1968.  11.  On aspirin, she'll stop this one week pre op.  Chief Complaint  Patient presents with  . Cholelithiasis    new pt   REFERRING PHYSICIAN: SMITH,CANDACE THIELE, MD  HISTORY OF PRESENT ILLNESS: Phyllis Knight is a 73 y.o. (DOB: 01/14/1941)  AA  female whose primary care physician is SMITH,CANDACE THIELE, MD and comes to me today for gall bladder diesease. Her husband is with her.  I saw her in the ER for gall bladder disease on 05/20/2014, but she wanted to go home and talk to Dr. C. Smith. She has had some continued right sided discomfort, but no fever.  She is eating okay.  She is now ready to go ahead with surgery.  If anything, she has had some constipation. She  has a reunion for her husband's family - [place] - on the 4th of July that they both want to attend. She has some oxycodone and Zofran from the ER visit.  She said that she had some breaking out on her chest, but I was not impressed.   Past Medical History  Diagnosis Date  . Arthritis   . Fibromyalgia   . Hypertension   . GERD (gastroesophageal reflux disease)   . H/O hiatal hernia   . Bradycardia   . History of uterine cancer     s/p hysterectomy  . 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 Urge urinary incontinence       Past Surgical History  Procedure Laterality Date  . Tympanoplasty Left   . Total knee arthroplasty Left 08/03/2013    Procedure: LEFT TOTAL KNEE ARTHROPLASTY;  Surgeon: Matthew D Olin, MD;  Location: [facility];  Service: Orthopedics;  Laterality: Left;  . Total abdominal hysterectomy w/ bilateral salpingoophorectomy  1972  . Tracheotomy    . Cervical fusion  2011  . Knee closed reduction Left 09/21/2013    Procedure: CLOSED MANIPULATION LEFT KNEE;  Surgeon: Matthew D Olin, MD;  Location:  [facility];  Service: Orthopedics;  Laterality: Left;        Current Outpatient Prescriptions  Medication Sig Dispense Refill  . amLODipine (NORVASC) 10 MG tablet Take 10 mg by mouth every morning.      Marland Kitchen. aspirin EC 325 MG tablet Take 325 mg by mouth daily.      Marland Kitchen. atenolol (TENORMIN) 25 MG tablet Take 12.5 mg by mouth every morning.      . benazepril (LOTENSIN) 20 MG tablet Take 20 mg by mouth every morning.      . cholecalciferol (VITAMIN D) 1000 UNITS tablet Take 1,000 Units by mouth daily.      . ferrous sulfate 325 (65 FE) MG tablet Take 1 tablet (325 mg total) by mouth 3 (three) times daily after meals.    3  . omeprazole (PRILOSEC) 20 MG capsule Take 20 mg by mouth every morning.       . pregabalin (LYRICA) 100 MG capsule Take 100 mg by mouth 3 (three) times daily.      . ondansetron (ZOFRAN ODT) 8 MG disintegrating tablet Take 1 tablet (8  mg total) by mouth every 8 (eight) hours as needed for nausea or vomiting.  20 tablet  0  . oxyCODONE (ROXICODONE) 5 MG immediate release tablet Take 1 tablet (5 mg total) by mouth every 4 (four) hours as needed for severe pain.  30 tablet  0   No current facility-administered medications for this visit.    Allergies  Allergen Reactions  . Codeine Nausea And Vomiting    REVIEW OF SYSTEMS: Skin: No history of rash. No history of abnormal moles.  Infection: No history of hepatitis or HIV. No history of MRSA.  Neurologic: No history of stroke. No history of seizure. No history of headaches.  Cardiac: HTN x 30 years. No MI  Pulmonary: Had pneumonia in 1968, required trach. No chronic pulmonary issues.  Endocrine: No diabetes. No thyroid disease.  Gastrointestinal: See HPI.  GYN:  She had a hysterectomy for bleeding in the 1970's Urologic: No history of kidney stones. No history of bladder infections.  Musculoskeletal: Left knee replacement by Dr. Charlann Boxerlin. Sept 204. Fibromyalgia x 3 years. Somewhat better on lyrica.  Hematologic: She is on one aspirin a day. Psycho-social: The patient is oriented. The patient has no obvious psychologic or social impairment to understanding our conversation and plan.  SOCIAL and FAMILY HISTORY: Married. Husband, Phyllis Knight, at bedside.  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  PHYSICAL EXAM: BP 142/96  Pulse 64  Temp(Src) 97 F (36.1 C) (Temporal)  Resp 20  Ht 5\' 6"  (1.676 m)  Wt 221 lb 9.6 oz (100.517 kg)  BMI 35.78 kg/m2  General: older AA  who is alert.  HEENT: Normal. Pupils equal. Neck: Supple. No mass.  No thyroid mass. Lymph Nodes:  No supraclavicular or cervical nodes. Lungs: Clear to auscultation and symmetric breath sounds. Heart:  RRR. No murmur or rub. Abdomen: Soft. No mass. No tenderness. No hernia. Normal bowel sounds.  Lower abdominal scar. Questionable hernia  in lower abdominal, to left of midline.  Rectal: Not done. Extremities:  Swelling left knee post replacement. Neurologic:  Grossly intact to motor and sensory function. Psychiatric: Has normal mood and affect. Behavior is normal.   DATA REVIEWED: Epic notes reviewed.  Ovidio Kinavid Newman, MD,  [facility] [address]  [address]   [place]    [id number] Phone:  [phone] FAX:  [phone]

## 2014-06-06 ENCOUNTER — Encounter (HOSPITAL_COMMUNITY): Payer: Self-pay | Admitting: Pharmacy Technician

## 2014-06-07 ENCOUNTER — Telehealth (INDEPENDENT_AMBULATORY_CARE_PROVIDER_SITE_OTHER): Payer: Self-pay

## 2014-06-07 ENCOUNTER — Other Ambulatory Visit (INDEPENDENT_AMBULATORY_CARE_PROVIDER_SITE_OTHER): Payer: Self-pay

## 2014-06-07 MED ORDER — OXYCODONE HCL 5 MG PO TABS: 5.0000 mg | ORAL_TABLET | ORAL | Status: DC | PRN

## 2014-06-07 NOTE — Telephone Encounter (Signed)
Called pt to let her know that Dr Ezzard StandingNewman has refilled her Oxycodone 5mg  and that Rx will be ready for her to pick up at the front desk

## 2014-06-07 NOTE — Telephone Encounter (Signed)
Pt is scheduled for a lap chole on 06/14/14 with Dr Ezzard StandingNewman. Pt was seen in the ED on 05/20/14 and was given a RX for Oxycodone 5mg . Pt would like to get a refill. Pt rates her pain a 8 at this time. Advised pt that she can take up to 800mg  of Ibuprofen every 8 hours as needed for pain. Informed pt that we would send Dr Ezzard StandingNewman a message regarding her pain meds. Pt verbalized understanding.

## 2014-06-08 NOTE — Progress Notes (Signed)
Cbc with dif, cmet, lipase, istate lactic acid 05-20-14 epic Chest xray 2 view 07-21-13 epic ekg 05-19-14 epic

## 2014-06-08 NOTE — Patient Instructions (Addendum)
20 Phyllis Knight  06/08/2014   Your procedure is scheduled on: Tuesday July 14th, 2015  [facility] and follow signs to  Short Stay Center at 1215 PM  Call this number if you have problems the morning of surgery [phone]   Remember:  Do not eat food  :After Midnight.  Clear liquids midnight until 815 am day of surgery, then nothing by mouth after 815 am day of surgery.   Take these medicines the morning of surgery with A SIP OF WATER: omeprazole, oxycodone, lyrica, atenolol, amlodipine                               You may not have any metal on your body including hair pins and piercings  Do not wear jewelry, make-up, lotions, powders, or deodorant.   Men may shave face and neck.  Do not bring valuables to the hospital. [facility] IS NOT RESPONSIBLE FOR VALUABLES.  Contacts, dentures or bridgework may not be worn into surgery.  Leave suitcase in the car. After surgery it may be brought to your room.  For patients admitted to the hospital, checkout time is 11:00 AM the day of discharge.   ________________________________________________________________________  [facility] - Preparing for [facility] Before surgery, you can play an important role.  Because skin is not sterile, your skin needs to be as free of germs as possible.  You can reduce the number of germs on your skin by washing with CHG (chlorahexidine gluconate) soap before surgery.  CHG is an antiseptic cleaner which kills germs and bonds with the skin to continue killing germs even after washing. Please DO NOT use if you have an allergy to CHG or antibacterial soaps.  If your skin becomes reddened/irritated stop using the CHG and inform your nurse when you arrive at Short Stay. Do not shave (including legs and underarms) for at least 48 hours prior to the first CHG shower.  You may shave your face/neck. Please follow these instructions carefully:  1.  Shower with CHG Soap the night before  surgery and the  morning of Surgery.  2.  If you choose to wash your hair, wash your hair first as usual with your  normal  shampoo.  3.  After you shampoo, rinse your hair and body thoroughly to remove the  shampoo.                           4.  Use CHG as you would any other liquid soap.  You can apply chg directly  to the skin and wash                       Gently with a scrungie or clean washcloth.  5.  Apply the CHG Soap to your body ONLY FROM THE NECK DOWN.   Do not use on face/ open                           Wound or open sores. Avoid contact with eyes, ears mouth and genitals (private parts).                       Wash face,  Genitals (private parts) with your normal soap.             6.  Wash thoroughly, paying special attention to the area where your surgery  will be performed.  7.  Thoroughly rinse your body with warm water from the neck down.  8.  DO NOT shower/wash with your normal soap after using and rinsing off  the CHG Soap.                9.  Pat yourself dry with a clean towel.            10.  Wear clean pajamas.            11.  Place clean sheets on your bed the night of your first shower and do not  sleep with pets. Day of Surgery : Do not apply any lotions/deodorants the morning of surgery.  Please wear clean clothes to the hospital/surgery center.  FAILURE TO FOLLOW THESE INSTRUCTIONS MAY RESULT IN THE CANCELLATION OF YOUR SURGERY PATIENT SIGNATURE_________________________________  NURSE SIGNATURE__________________________________  ________________________________________________________________________    CLEAR LIQUID DIET   Foods Allowed                                                                     Foods Excluded  Coffee and tea, regular and decaf                             liquids that you cannot  Plain Jell-O in any flavor                                             see through such as: Fruit ices (not with fruit pulp)                                      milk, soups, orange juice  Iced Popsicles                                    All solid food Carbonated beverages, regular and diet                                    Cranberry, grape and apple juices Sports drinks like Gatorade Lightly seasoned clear broth or consume(fat free) Sugar, honey syrup  Sample Menu Breakfast                                Lunch                                     Supper Cranberry juice                    Beef broth  Chicken broth Jell-O                                     Grape juice                           Apple juice Coffee or tea                        Jell-O                                      Popsicle                                                Coffee or tea                        Coffee or tea  _____________________________________________________________________

## 2014-06-10 ENCOUNTER — Encounter (HOSPITAL_COMMUNITY)
Admission: RE | Admit: 2014-06-10 | Discharge: 2014-06-10 | Disposition: A | Discharge status: Home / Self Care | Payer: Medicare Other | Source: Ambulatory Visit | Attending: Surgery | Admitting: Surgery

## 2014-06-10 ENCOUNTER — Encounter (HOSPITAL_COMMUNITY): Payer: Self-pay

## 2014-06-10 DIAGNOSIS — Z01812 Encounter for preprocedural laboratory examination: Secondary | ICD-10-CM | POA: Insufficient documentation

## 2014-06-10 DIAGNOSIS — R079 Chest pain, unspecified: Secondary | ICD-10-CM

## 2014-06-10 DIAGNOSIS — Z01810 Encounter for preprocedural cardiovascular examination: Secondary | ICD-10-CM | POA: Insufficient documentation

## 2014-06-10 HISTORY — DX: Anemia, unspecified: D64.9

## 2014-06-10 HISTORY — DX: Chest pain, unspecified: R07.9

## 2014-06-10 HISTORY — DX: Vitamin D deficiency, unspecified: E55.9

## 2014-06-10 NOTE — Progress Notes (Signed)
06/10/14 0908  OBSTRUCTIVE SLEEP APNEA  Have you ever been diagnosed with sleep apnea through a sleep study? No  Do you snore loudly (loud enough to be heard through closed doors)?  1  Do you often feel tired, fatigued, or sleepy during the daytime? 1  Has anyone observed you stop breathing during your sleep? 0  Do you have, or are you being treated for high blood pressure? 1  BMI more than 35 kg/m2? 1  Age over 74 years old? 0  Neck circumference greater than 40 cm/16 inches? 0  Gender: 0  Obstructive Sleep Apnea Score 4  Score 4 or greater  Results sent to PCP

## 2014-06-14 ENCOUNTER — Encounter (HOSPITAL_COMMUNITY): Payer: Medicare Other | Admitting: Anesthesiology

## 2014-06-14 ENCOUNTER — Ambulatory Visit (HOSPITAL_COMMUNITY): Payer: Medicare Other

## 2014-06-14 ENCOUNTER — Ambulatory Visit (HOSPITAL_COMMUNITY): Payer: Medicare Other | Admitting: Anesthesiology

## 2014-06-14 ENCOUNTER — Encounter (HOSPITAL_COMMUNITY): Payer: Self-pay | Admitting: *Deleted

## 2014-06-14 ENCOUNTER — Observation Stay (HOSPITAL_COMMUNITY)
Admission: RE | Admit: 2014-06-14 | Discharge: 2014-06-15 | Disposition: A | Discharge status: Home / Self Care | Payer: Medicare Other | Source: Ambulatory Visit | Attending: Surgery | Admitting: Surgery

## 2014-06-14 ENCOUNTER — Encounter (HOSPITAL_COMMUNITY)
Admission: RE | Disposition: A | Discharge status: Home / Self Care | Payer: Self-pay | Source: Ambulatory Visit | Attending: Surgery

## 2014-06-14 DIAGNOSIS — K429 Umbilical hernia without obstruction or gangrene: Secondary | ICD-10-CM | POA: Insufficient documentation

## 2014-06-14 DIAGNOSIS — Z90710 Acquired absence of both cervix and uterus: Secondary | ICD-10-CM | POA: Insufficient documentation

## 2014-06-14 DIAGNOSIS — IMO0001 Reserved for inherently not codable concepts without codable children: Secondary | ICD-10-CM | POA: Insufficient documentation

## 2014-06-14 DIAGNOSIS — I10 Essential (primary) hypertension: Secondary | ICD-10-CM | POA: Insufficient documentation

## 2014-06-14 DIAGNOSIS — Z79899 Other long term (current) drug therapy: Secondary | ICD-10-CM | POA: Insufficient documentation

## 2014-06-14 DIAGNOSIS — K5730 Diverticulosis of large intestine without perforation or abscess without bleeding: Secondary | ICD-10-CM | POA: Insufficient documentation

## 2014-06-14 DIAGNOSIS — M25469 Effusion, unspecified knee: Secondary | ICD-10-CM | POA: Insufficient documentation

## 2014-06-14 DIAGNOSIS — Z96659 Presence of unspecified artificial knee joint: Secondary | ICD-10-CM | POA: Insufficient documentation

## 2014-06-14 DIAGNOSIS — K439 Ventral hernia without obstruction or gangrene: Secondary | ICD-10-CM | POA: Insufficient documentation

## 2014-06-14 DIAGNOSIS — Z7982 Long term (current) use of aspirin: Secondary | ICD-10-CM | POA: Insufficient documentation

## 2014-06-14 DIAGNOSIS — Z981 Arthrodesis status: Secondary | ICD-10-CM | POA: Insufficient documentation

## 2014-06-14 DIAGNOSIS — K219 Gastro-esophageal reflux disease without esophagitis: Secondary | ICD-10-CM | POA: Insufficient documentation

## 2014-06-14 DIAGNOSIS — K8010 Calculus of gallbladder with chronic cholecystitis without obstruction: Secondary | ICD-10-CM

## 2014-06-14 DIAGNOSIS — K829 Disease of gallbladder, unspecified: Secondary | ICD-10-CM

## 2014-06-14 HISTORY — PX: CHOLECYSTECTOMY: SHX55

## 2014-06-14 SURGERY — LAPAROSCOPIC CHOLECYSTECTOMY WITH INTRAOPERATIVE CHOLANGIOGRAM
Anesthesia: General | Site: Abdomen

## 2014-06-14 MED ORDER — GLYCOPYRROLATE 0.2 MG/ML IJ SOLN
INTRAMUSCULAR | Status: DC | PRN
  Administered 2014-06-14: 0.6 mg via INTRAVENOUS

## 2014-06-14 MED ORDER — FENTANYL CITRATE 0.05 MG/ML IJ SOLN
INTRAMUSCULAR | Status: DC | PRN
  Administered 2014-06-14 (×3): 50 ug via INTRAVENOUS
  Administered 2014-06-14: 100 ug via INTRAVENOUS

## 2014-06-14 MED ORDER — DEXAMETHASONE SODIUM PHOSPHATE 10 MG/ML IJ SOLN
INTRAMUSCULAR | Status: DC | PRN
  Administered 2014-06-14: 10 mg via INTRAVENOUS

## 2014-06-14 MED ORDER — CEFAZOLIN SODIUM-DEXTROSE 2-3 GM-% IV SOLR
INTRAVENOUS | Status: AC
  Filled 2014-06-14: qty 50

## 2014-06-14 MED ORDER — LACTATED RINGERS IR SOLN
Status: DC | PRN
  Administered 2014-06-14: 1000 mL

## 2014-06-14 MED ORDER — CEFAZOLIN SODIUM-DEXTROSE 2-3 GM-% IV SOLR
2.0000 g | INTRAVENOUS | Status: AC
  Administered 2014-06-14: 2 g via INTRAVENOUS

## 2014-06-14 MED ORDER — LIDOCAINE HCL (CARDIAC) 20 MG/ML IV SOLN
INTRAVENOUS | Status: AC
  Filled 2014-06-14: qty 5

## 2014-06-14 MED ORDER — ACETAMINOPHEN 500 MG PO TABS: 500.0000 mg | ORAL_TABLET | Freq: Four times a day (QID) | ORAL | Status: DC | PRN

## 2014-06-14 MED ORDER — HYDROCODONE-ACETAMINOPHEN 5-325 MG PO TABS
1.0000 | ORAL_TABLET | ORAL | Status: DC | PRN
  Administered 2014-06-14 – 2014-06-15 (×3): 2 via ORAL
  Filled 2014-06-14 (×3): qty 2

## 2014-06-14 MED ORDER — SUCCINYLCHOLINE CHLORIDE 20 MG/ML IJ SOLN
INTRAMUSCULAR | Status: DC | PRN
  Administered 2014-06-14: 100 mg via INTRAVENOUS

## 2014-06-14 MED ORDER — BUPIVACAINE-EPINEPHRINE (PF) 0.5% -1:200000 IJ SOLN
INTRAMUSCULAR | Status: AC
  Filled 2014-06-14: qty 30

## 2014-06-14 MED ORDER — AMLODIPINE BESYLATE 10 MG PO TABS
10.0000 mg | ORAL_TABLET | Freq: Every morning | ORAL | Status: DC
  Administered 2014-06-15: 10 mg via ORAL
  Filled 2014-06-14: qty 1

## 2014-06-14 MED ORDER — LIP MEDEX EX OINT
1.0000 "application " | TOPICAL_OINTMENT | Freq: Two times a day (BID) | CUTANEOUS | Status: DC
  Administered 2014-06-14 – 2014-06-15 (×2): 1 via TOPICAL
  Filled 2014-06-14: qty 7

## 2014-06-14 MED ORDER — PROPOFOL 10 MG/ML IV BOLUS
INTRAVENOUS | Status: DC | PRN
  Administered 2014-06-14: 180 mg via INTRAVENOUS

## 2014-06-14 MED ORDER — PNEUMOCOCCAL VAC POLYVALENT 25 MCG/0.5ML IJ INJ
0.5000 mL | INJECTION | INTRAMUSCULAR | Status: DC
  Filled 2014-06-14 (×2): qty 0.5

## 2014-06-14 MED ORDER — ONDANSETRON 8 MG PO TBDP: 8.0000 mg | ORAL_TABLET | Freq: Three times a day (TID) | ORAL | Status: DC | PRN

## 2014-06-14 MED ORDER — PHENYLEPHRINE 40 MCG/ML (10ML) SYRINGE FOR IV PUSH (FOR BLOOD PRESSURE SUPPORT)
PREFILLED_SYRINGE | INTRAVENOUS | Status: AC
  Filled 2014-06-14: qty 10

## 2014-06-14 MED ORDER — HEPARIN SODIUM (PORCINE) 5000 UNIT/ML IJ SOLN
5000.0000 [IU] | Freq: Three times a day (TID) | INTRAMUSCULAR | Status: DC
  Administered 2014-06-14 – 2014-06-15 (×2): 5000 [IU] via SUBCUTANEOUS
  Filled 2014-06-14 (×5): qty 1

## 2014-06-14 MED ORDER — LACTATED RINGERS IV SOLN: INTRAVENOUS | Status: DC

## 2014-06-14 MED ORDER — NEOSTIGMINE METHYLSULFATE 10 MG/10ML IV SOLN
INTRAVENOUS | Status: DC | PRN
  Administered 2014-06-14: 4 mg via INTRAVENOUS

## 2014-06-14 MED ORDER — ATENOLOL 12.5 MG HALF TABLET
12.5000 mg | ORAL_TABLET | Freq: Every morning | ORAL | Status: DC
  Administered 2014-06-15: 12.5 mg via ORAL
  Filled 2014-06-14: qty 1

## 2014-06-14 MED ORDER — PROPOFOL 10 MG/ML IV BOLUS
INTRAVENOUS | Status: AC
  Filled 2014-06-14: qty 20

## 2014-06-14 MED ORDER — FENTANYL CITRATE 0.05 MG/ML IJ SOLN
INTRAMUSCULAR | Status: AC
  Filled 2014-06-14: qty 5

## 2014-06-14 MED ORDER — DEXAMETHASONE SODIUM PHOSPHATE 10 MG/ML IJ SOLN
INTRAMUSCULAR | Status: AC
  Filled 2014-06-14: qty 1

## 2014-06-14 MED ORDER — MAGIC MOUTHWASH
15.0000 mL | Freq: Four times a day (QID) | ORAL | Status: DC | PRN
  Filled 2014-06-14: qty 15

## 2014-06-14 MED ORDER — LACTATED RINGERS IV SOLN
INTRAVENOUS | Status: DC
Start: 2014-06-14 — End: 2014-06-14
  Administered 2014-06-14: 1000 mL via INTRAVENOUS
  Administered 2014-06-14: 14:00:00 via INTRAVENOUS

## 2014-06-14 MED ORDER — PHENOL 1.4 % MT LIQD
2.0000 | OROMUCOSAL | Status: DC | PRN
  Administered 2014-06-14: 2 via OROMUCOSAL
  Filled 2014-06-14 (×2): qty 177

## 2014-06-14 MED ORDER — HYDROMORPHONE HCL PF 1 MG/ML IJ SOLN
0.2500 mg | INTRAMUSCULAR | Status: DC | PRN
Start: 2014-06-14 — End: 2014-06-14

## 2014-06-14 MED ORDER — KCL IN DEXTROSE-NACL 20-5-0.45 MEQ/L-%-% IV SOLN
INTRAVENOUS | Status: DC
  Administered 2014-06-14 – 2014-06-15 (×2): via INTRAVENOUS
  Filled 2014-06-14 (×4): qty 1000

## 2014-06-14 MED ORDER — MIDAZOLAM HCL 2 MG/2ML IJ SOLN
INTRAMUSCULAR | Status: AC
  Filled 2014-06-14: qty 2

## 2014-06-14 MED ORDER — ROCURONIUM BROMIDE 100 MG/10ML IV SOLN
INTRAVENOUS | Status: DC | PRN
  Administered 2014-06-14: 10 mg via INTRAVENOUS
  Administered 2014-06-14: 20 mg via INTRAVENOUS
  Administered 2014-06-14 (×2): 10 mg via INTRAVENOUS

## 2014-06-14 MED ORDER — PANTOPRAZOLE SODIUM 40 MG PO TBEC
40.0000 mg | DELAYED_RELEASE_TABLET | Freq: Every day | ORAL | Status: DC
  Administered 2014-06-15: 40 mg via ORAL
  Filled 2014-06-14: qty 1

## 2014-06-14 MED ORDER — LIDOCAINE HCL (CARDIAC) 20 MG/ML IV SOLN
INTRAVENOUS | Status: DC | PRN
  Administered 2014-06-14: 100 mg via INTRAVENOUS

## 2014-06-14 MED ORDER — IBUPROFEN 600 MG PO TABS
600.0000 mg | ORAL_TABLET | Freq: Four times a day (QID) | ORAL | Status: DC | PRN
  Filled 2014-06-14: qty 1

## 2014-06-14 MED ORDER — MORPHINE SULFATE 2 MG/ML IJ SOLN
1.0000 mg | INTRAMUSCULAR | Status: DC | PRN
  Administered 2014-06-14: 2 mg via INTRAVENOUS
  Filled 2014-06-14 (×2): qty 1

## 2014-06-14 MED ORDER — BUPIVACAINE HCL (PF) 0.25 % IJ SOLN
INTRAMUSCULAR | Status: AC
Start: 2014-06-14 — End: 2014-06-14
  Filled 2014-06-14: qty 60

## 2014-06-14 MED ORDER — MENTHOL 3 MG MT LOZG
1.0000 | LOZENGE | OROMUCOSAL | Status: DC | PRN
  Filled 2014-06-14: qty 9

## 2014-06-14 MED ORDER — BENAZEPRIL HCL 20 MG PO TABS
20.0000 mg | ORAL_TABLET | Freq: Every morning | ORAL | Status: DC
Start: 2014-06-15 — End: 2014-06-15
  Administered 2014-06-15: 20 mg via ORAL
  Filled 2014-06-14: qty 1

## 2014-06-14 MED ORDER — PREGABALIN 50 MG PO CAPS
100.0000 mg | ORAL_CAPSULE | Freq: Three times a day (TID) | ORAL | Status: DC
  Administered 2014-06-14 – 2014-06-15 (×3): 100 mg via ORAL
  Filled 2014-06-14 (×3): qty 2

## 2014-06-14 MED ORDER — BUPIVACAINE HCL (PF) 0.25 % IJ SOLN
INTRAMUSCULAR | Status: DC | PRN
  Administered 2014-06-14: 10 mL

## 2014-06-14 MED ORDER — PROMETHAZINE HCL 25 MG/ML IJ SOLN: 12.5000 mg | Freq: Four times a day (QID) | INTRAMUSCULAR | Status: DC | PRN

## 2014-06-14 MED ORDER — ROCURONIUM BROMIDE 100 MG/10ML IV SOLN
INTRAVENOUS | Status: AC
  Filled 2014-06-14: qty 1

## 2014-06-14 MED ORDER — ALUM & MAG HYDROXIDE-SIMETH 200-200-20 MG/5ML PO SUSP: 30.0000 mL | Freq: Four times a day (QID) | ORAL | Status: DC | PRN

## 2014-06-14 MED ORDER — MIDAZOLAM HCL 5 MG/5ML IJ SOLN
INTRAMUSCULAR | Status: DC | PRN
  Administered 2014-06-14: 2 mg via INTRAVENOUS

## 2014-06-14 MED ORDER — PHENYLEPHRINE HCL 10 MG/ML IJ SOLN
INTRAMUSCULAR | Status: DC | PRN
  Administered 2014-06-14 (×5): 40 ug via INTRAVENOUS

## 2014-06-14 SURGICAL SUPPLY — 51 items
ADH SKN CLS APL DERMABOND .7 (GAUZE/BANDAGES/DRESSINGS) ×1
APL SKNCLS STERI-STRIP NONHPOA (GAUZE/BANDAGES/DRESSINGS) ×1
APPLIER CLIP ROT 10 11.4 M/L (STAPLE) ×2
APR CLP MED LRG 11.4X10 (STAPLE) ×1
BAG SPEC RTRVL 10 TROC 200 (ENDOMECHANICALS) ×1
BAG SPEC RTRVL LRG 6X4 10 (ENDOMECHANICALS) ×1
BENZOIN TINCTURE PRP APPL 2/3 (GAUZE/BANDAGES/DRESSINGS) ×2 IMPLANT
CANISTER SUCTION 2500CC (MISCELLANEOUS) ×2 IMPLANT
CATH REDDICK CHOLANGI 4FR 50CM (CATHETERS) ×2 IMPLANT
CHLORAPREP W/TINT 26ML (MISCELLANEOUS) ×2 IMPLANT
CHOLANGIOGRAM CATH TAUT (CATHETERS) ×2 IMPLANT
CLIP APPLIE ROT 10 11.4 M/L (STAPLE) ×1 IMPLANT
COVER MAYO STAND STRL (DRAPES) ×2 IMPLANT
DECANTER SPIKE VIAL GLASS SM (MISCELLANEOUS) ×2 IMPLANT
DERMABOND ADVANCED (GAUZE/BANDAGES/DRESSINGS) ×1
DERMABOND ADVANCED .7 DNX12 (GAUZE/BANDAGES/DRESSINGS) ×1 IMPLANT
DRAPE C-ARM 42X120 X-RAY (DRAPES) ×2 IMPLANT
DRAPE LAPAROSCOPIC ABDOMINAL (DRAPES) ×2 IMPLANT
DRAPE UTILITY XL STRL (DRAPES) ×2 IMPLANT
ELECT REM PT RETURN 9FT ADLT (ELECTROSURGICAL) ×2
ELECTRODE REM PT RTRN 9FT ADLT (ELECTROSURGICAL) ×1 IMPLANT
ENDOLOOP SUT PDS II  0 18 (SUTURE) ×2
ENDOLOOP SUT PDS II 0 18 (SUTURE) ×2 IMPLANT
GLOVE SURG SIGNA 7.5 PF LTX (GLOVE) ×2 IMPLANT
GOWN SPEC L4 XLG W/TWL (GOWN DISPOSABLE) ×2 IMPLANT
GOWN STRL REUS W/ TWL XL LVL3 (GOWN DISPOSABLE) ×5 IMPLANT
GOWN STRL REUS W/TWL XL LVL3 (GOWN DISPOSABLE) ×10
HEMOSTAT SURGICEL 4X8 (HEMOSTASIS) IMPLANT
IV CATH 14GX2 1/4 (CATHETERS) ×2 IMPLANT
IV SET MACRO CATH EXT 6 LUER (IV SETS) ×2 IMPLANT
KIT BASIN OR (CUSTOM PROCEDURE TRAY) ×2 IMPLANT
NS IRRIG 1000ML POUR BTL (IV SOLUTION) ×2 IMPLANT
POUCH RETRIEVAL ECOSAC 10 (ENDOMECHANICALS) ×1 IMPLANT
POUCH RETRIEVAL ECOSAC 10MM (ENDOMECHANICALS) ×1
POUCH SPECIMEN RETRIEVAL 10MM (ENDOMECHANICALS) ×2 IMPLANT
SET IRRIG TUBING LAPAROSCOPIC (IRRIGATION / IRRIGATOR) ×2 IMPLANT
SLEEVE XCEL OPT CAN 5 100 (ENDOMECHANICALS) ×4 IMPLANT
SOLUTION ANTI FOG 6CC (MISCELLANEOUS) ×2 IMPLANT
STOPCOCK 4 WAY LG BORE MALE ST (IV SETS) ×2 IMPLANT
STRIP CLOSURE SKIN 1/4X4 (GAUZE/BANDAGES/DRESSINGS) ×2 IMPLANT
SUT MON AB 5-0 PS2 18 (SUTURE) ×2 IMPLANT
SUT VIC AB 2-0 SH 27 (SUTURE) ×1
SUT VIC AB 2-0 SH 27X BRD (SUTURE) ×1 IMPLANT
SUT VIC AB 5-0 PS2 18 (SUTURE) ×2 IMPLANT
TOWEL OR 17X26 10 PK STRL BLUE (TOWEL DISPOSABLE) ×2 IMPLANT
TOWEL OR NON WOVEN STRL DISP B (DISPOSABLE) ×2 IMPLANT
TRAY LAP CHOLE (CUSTOM PROCEDURE TRAY) ×2 IMPLANT
TROCAR BLADELESS OPT 5 100 (ENDOMECHANICALS) ×2 IMPLANT
TROCAR XCEL BLUNT TIP 100MML (ENDOMECHANICALS) ×2 IMPLANT
TROCAR XCEL NON-BLD 11X100MML (ENDOMECHANICALS) ×2 IMPLANT
TUBING INSUFFLATION 10FT LAP (TUBING) ×2 IMPLANT

## 2014-06-14 NOTE — Transfer of Care (Signed)
Immediate Anesthesia Transfer of Care Note  Patient: Phyllis Knight Phyllis Knight  Procedure(s) Performed: Procedure(s): LAPAROSCOPIC CHOLECYSTECTOMY WITH INTRAOPERATIVE CHOLANGIOGRAM (N/A)  Patient Location: PACU  Anesthesia Type:General  Level of Consciousness: awake, alert  and oriented  Airway & Oxygen Therapy: Patient Spontanous Breathing and Patient connected to face mask oxygen  Post-op Assessment: Report given to PACU RN and Post -op Vital signs reviewed and stable  Post vital signs: Reviewed and stable  Complications: No apparent anesthesia complications

## 2014-06-14 NOTE — H&P (View-Only) (Signed)
Re:   Phyllis ForsterMattie L Lynne DOB:   03-Sep-1941 MRN:   [id number]  ASSESSMENT AND PLAN: 1. Gall bladder disease   I discussed with the patient the indications and risks of gall bladder surgery. The primary risks of gall bladder surgery include, but are not limited to, bleeding, infection, common bile duct injury, and open surgery. There is also the risk that the patient may have continued symptoms after surgery. However, the likelihood of improvement in symptoms and return to the patient's normal status is good. We discussed the typical post-operative recovery course. I tried to answer the patient's questions.   I gave her a book on gall bladder disease.  She has talked to Dr. Katrinka BlazingSmith.  Her husband has a family reunion on 7/4, so she wants to wait until after that date to have surgery  2. Arthritis   Left knee replacement - Sept 2014 - Oliin   Still has swelling in the knee  3. Fibromyalgia  4. GERD  5. HTN x 30 years  6. There is a note about uterine cancer in the chart, but the patient denies this   She had a hysterectomy in 1972 for bleeding  7. Diverticulosis  8. Left ventral hernia   This is through the lower midline scar and I do not feel much on PE.  9. Umbilical hernia  10. History of trach for pneumonia in 1968.  11.  On aspirin, she'll stop this one week pre op.  Chief Complaint  Patient presents with  . Cholelithiasis    new pt   REFERRING PHYSICIAN: Allean FoundSMITH,CANDACE THIELE, MD  HISTORY OF PRESENT ILLNESS: Phyllis Knight is a 73 y.o. (DOB: 03-Sep-1941)  AA  female whose primary care physician is Allean FoundSMITH,CANDACE THIELE, MD and comes to me today for gall bladder diesease. Her husband is with her.  I saw her in the ER for gall bladder disease on 05/20/2014, but she wanted to go home and talk to Dr. Erby Pian. Smith. She has had some continued right sided discomfort, but no fever.  She is eating okay.  She is now ready to go ahead with surgery.  If anything, she has had some constipation. She  has a reunion for her husband's family - [place] - on the 4th of July that they both want to attend. She has some oxycodone and Zofran from the ER visit.  She said that she had some breaking out on her chest, but I was not impressed.   Past Medical History  Diagnosis Date  . Arthritis   . Fibromyalgia   . Hypertension   . GERD (gastroesophageal reflux disease)   . H/O hiatal hernia   . Bradycardia   . History of uterine cancer     s/p hysterectomy  . 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 Urge urinary incontinence       Past Surgical History  Procedure Laterality Date  . Tympanoplasty Left   . Total knee arthroplasty Left 08/03/2013    Procedure: LEFT TOTAL KNEE ARTHROPLASTY;  Surgeon: Shelda PalMatthew D Olin, MD;  Location: [facility];  Service: Orthopedics;  Laterality: Left;  . Total abdominal hysterectomy w/ bilateral salpingoophorectomy  1972  . Tracheotomy    . Cervical fusion  2011  . Knee closed reduction Left 09/21/2013    Procedure: CLOSED MANIPULATION LEFT KNEE;  Surgeon: Shelda PalMatthew D Olin, MD;  [facility];  Service: Orthopedics;  Laterality: Left;  Current Outpatient Prescriptions  Medication Sig Dispense Refill  . amLODipine (NORVASC) 10 MG tablet Take 10 mg by mouth every morning.      Marland Kitchen. aspirin EC 325 MG tablet Take 325 mg by mouth daily.      Marland Kitchen. atenolol (TENORMIN) 25 MG tablet Take 12.5 mg by mouth every morning.      . benazepril (LOTENSIN) 20 MG tablet Take 20 mg by mouth every morning.      . cholecalciferol (VITAMIN D) 1000 UNITS tablet Take 1,000 Units by mouth daily.      . ferrous sulfate 325 (65 FE) MG tablet Take 1 tablet (325 mg total) by mouth 3 (three) times daily after meals.    3  . omeprazole (PRILOSEC) 20 MG capsule Take 20 mg by mouth every morning.       . pregabalin (LYRICA) 100 MG capsule Take 100 mg by mouth 3 (three) times daily.      . ondansetron (ZOFRAN ODT) 8 MG disintegrating tablet Take 1 tablet (8  mg total) by mouth every 8 (eight) hours as needed for nausea or vomiting.  20 tablet  0  . oxyCODONE (ROXICODONE) 5 MG immediate release tablet Take 1 tablet (5 mg total) by mouth every 4 (four) hours as needed for severe pain.  30 tablet  0   No current facility-administered medications for this visit.    Allergies  Allergen Reactions  . Codeine Nausea And Vomiting    REVIEW OF SYSTEMS: Skin: No history of rash. No history of abnormal moles.  Infection: No history of hepatitis or HIV. No history of MRSA.  Neurologic: No history of stroke. No history of seizure. No history of headaches.  Cardiac: HTN x 30 years. No MI  Pulmonary: Had pneumonia in 1968, required trach. No chronic pulmonary issues.  Endocrine: No diabetes. No thyroid disease.  Gastrointestinal: See HPI.  GYN:  She had a hysterectomy for bleeding in the 1970's Urologic: No history of kidney stones. No history of bladder infections.  Musculoskeletal: Left knee replacement by Dr. Charlann Boxerlin. Sept 204. Fibromyalgia x 3 years. Somewhat better on lyrica.  Hematologic: She is on one aspirin a day. Psycho-social: The patient is oriented. The patient has no obvious psychologic or social impairment to understanding our conversation and plan.  SOCIAL and FAMILY HISTORY: Married. Husband, [name], at bedside.  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  PHYSICAL EXAM: BP 142/96  Pulse 64  Temp(Src) 97 F (36.1 C) (Temporal)  Resp 20  Ht 5\' 6"  (1.676 m)  Wt 221 lb 9.6 oz (100.517 kg)  BMI 35.78 kg/m2  General: older AA  who is alert.  HEENT: Normal. Pupils equal. Neck: Supple. No mass.  No thyroid mass. Lymph Nodes:  No supraclavicular or cervical nodes. Lungs: Clear to auscultation and symmetric breath sounds. Heart:  RRR. No murmur or rub. Abdomen: Soft. No mass. No tenderness. No hernia. Normal bowel sounds.  Lower abdominal scar. Questionable hernia  in lower abdominal, to left of midline.  Rectal: Not done. Extremities:  Swelling left knee post replacement. Neurologic:  Grossly intact to motor and sensory function. Psychiatric: Has normal mood and affect. Behavior is normal.   DATA REVIEWED: Epic notes reviewed.  Ovidio Kinavid Newman, MD,  [facility] [address]  [address]   [place], [place]    [id number] Phone:  [phone] FAX:  [phone]

## 2014-06-14 NOTE — Interval H&P Note (Signed)
History and Physical Interval Note:  06/14/2014 12:14 PM  Phyllis Knight  has presented today for surgery, with the diagnosis of gallbladder disease  The various methods of treatment have been discussed with the patient and family.   Husband is here.  After consideration of risks, benefits and other options for treatment, the patient has consented to  Procedure(s): LAPAROSCOPIC CHOLECYSTECTOMY WITH INTRAOPERATIVE CHOLANGIOGRAM (N/A) as a surgical intervention .  The patient's history has been reviewed, patient examined, no change in status, stable for surgery.  I have reviewed the patient's chart and labs.  Questions were answered to the patient's satisfaction.     Arris Meyn H

## 2014-06-14 NOTE — Op Note (Signed)
06/14/2014  2:31 PM  PATIENT:  Phyllis Knight, 73 y.o., female, MRN: [id number]  PREOP DIAGNOSIS:  gallbladder disease  POSTOP DIAGNOSIS:   Severe chronic cholecystitis, stone impact in cystic duct/CBD junction  PROCEDURE:   Procedure(s):  LAPAROSCOPIC CHOLECYSTECTOMY (unable to do INTRAOPERATIVE CHOLANGIOGRAM)  SURGEON:   Ovidio Kinavid Quaneshia Wareing, M.D.  Threasa HeadsASSISTANFredonia Highland:   E. Wilson, M.D.  ANESTHESIA:   general  Anesthesiologist: Gaetano Hawthorneharles L Ewell, MD CRNA: Enriqueta ShutterPeggy Williford, CRNA; Paris LoreShannon M Blanton, CRNA  General  ASA: 2  EBL:  minimal  ml  BLOOD ADMINISTERED: none  DRAINS: none   LOCAL MEDICATIONS USED:   20 cc 1/4% marcaine  SPECIMEN:   Gall blader  COUNTS CORRECT:  YES  INDICATIONS FOR PROCEDURE:  Phyllis ForsterMattie L Fettig is a 73 y.o. (DOB: 1941-09-06) WW  female whose primary care physician is Allean FoundSMITH,CANDACE THIELE, MD and comes for cholecystectomy.   The indications and risks of the gall bladder surgery were explained to the patient.  The risks include, but are not limited to, infection, bleeding, common bile duct injury and open surgery.  SURGERY:  The patient [facility].  The abdomen was prepped with chloroprep.  The patient was given 2 gm Ancef at the beginning of the operation.   A time out was held and the surgical checklist run.   Because of prior lower abdominal surgery that extended above the umbilicus and evidence of an umbilical hernia, I accessed her abdominal cavity with a 5 mm Optiview in the LUQ.  Four additional trocars were inserted: a 10 mm trocar in the sub-xiphoid location, a 5 mm trocar in the right mid subcostal area, a 5 mm trocar in the right mid abdomen for the camera, and a 5 mm trocar in the right lateral subcostal area.   The abdomen was explored and the liver, stomach, and bowel that could be seen were unremarkable.  From her umbilicus below, she had adhesions to the anterior abdominal wall.  I took down some the adhesions almost to the  umbilicus.  I spent about 5 minutes doing this.  Because of the intra-abdominal adhesions, I was not able to assess the two abdominal wall hernias noted on CT scan (umbilical and left lower quadrant).   The gall bladder was identified, grasped, and rotated cephalad.  It was densely scarred and full of stones.   Disssection was carried down to the gall bladder/cystic duct junction and the cystic duct isolated.  The was essentially no cystic duct. The patient had a 1.5 cm stone impacted at the junction of the gall bladder and common bile duct.   I took some pictures of the impacted stone.  I tried to shot an intra-operative cholangiogram.  I used a Taut catheter, the Reddick catheter, I tried to sew around the Taut catheter, but despite the method that I tried, I could not secure the cholangiocath and keep it from leaking.  I had left a 2 cm cuff of gall bladder/cystic duct to work with.  I placed 0 PDS Endoloops x 2 over the stump of the cystic duct.   The cystic duct was tripley endoclipped and the cystic artery was identified and clipped.  The gall bladder was bluntly and sharpley dissected from the gall bladder bed.   After the gall bladder was removed from the liver, the gall bladder bed and Triangle of [place] were inspected.  There was no bleeding or bile leak.  The gall bladder was placed in a endocatch  bag and delivered through the subxiphoid incision.  The abdomen was irrigated with 1,000 cc saline.   The trocars were then removed.  I infiltrated 20cc of 1/4% Marcaine into the incisions.  The skin at each port was closed with 5-0 vicryl.  The skin was painted with Dermabond.  The patient's sponge and needle count were correct.  The patient was transported to the RR in good condition.   I will keep her overnight.  Because I could not suoot a cholangiogram, I will check a CMP tomorrow to check her LFT's.  Ovidio Kin, MD, [facility] Pager: [phone] Office phone:   [phone]

## 2014-06-14 NOTE — Anesthesia Postprocedure Evaluation (Signed)
  Anesthesia Post-op Note  Patient: Phyllis Knight  Procedure(s) Performed: Procedure(s) (LRB): LAPAROSCOPIC CHOLECYSTECTOMY WITH attempted INTRAOPERATIVE CHOLANGIOGRAM (N/A)  Patient Location: PACU  Anesthesia Type: General  Level of Consciousness: awake and alert   Airway and Oxygen Therapy: Patient Spontanous Breathing  Post-op Pain: mild  Post-op Assessment: Post-op Vital signs reviewed, Patient's Cardiovascular Status Stable, Respiratory Function Stable, Patent Airway and No signs of Nausea or vomiting  Last Vitals:  Filed Vitals:   06/14/14 1515  BP: 159/84  Pulse: 49  Temp:   Resp: 22    Post-op Vital Signs: stable   Complications: No apparent anesthesia complications

## 2014-06-14 NOTE — Anesthesia Preprocedure Evaluation (Signed)
Anesthesia Evaluation  Patient identified by MRN, date of birth, ID band Patient awake    Reviewed: Allergy & Precautions, H&P , NPO status , Patient's Chart, lab work & pertinent test results  Airway Mallampati: II TM Distance: >3 FB Neck ROM: full    Dental no notable dental hx. (+) Teeth Intact, Dental Advisory Given   Pulmonary neg pulmonary ROS,  breath sounds clear to auscultation  Pulmonary exam normal       Cardiovascular Exercise Tolerance: Good hypertension, Pt. on home beta blockers and Pt. on medications Rhythm:regular Rate:Normal     Neuro/Psych Cervical fusion negative neurological ROS  negative psych ROS   GI/Hepatic negative GI ROS, Neg liver ROS, GERD-  Medicated and Controlled,  Endo/Other  negative endocrine ROS  Renal/GU negative Renal ROS  negative genitourinary   Musculoskeletal   Abdominal   Peds  Hematology negative hematology ROS (+)   Anesthesia Other Findings   Reproductive/Obstetrics negative OB ROS                           Anesthesia Physical Anesthesia Plan  ASA: II  Anesthesia Plan: General   Post-op Pain Management:    Induction: Intravenous  Airway Management Planned: Oral ETT  Additional Equipment:   Intra-op Plan:   Post-operative Plan: Extubation in OR  Informed Consent: I have reviewed the patients History and Physical, chart, labs and discussed the procedure including the risks, benefits and alternatives for the proposed anesthesia with the patient or authorized representative who has indicated his/her understanding and acceptance.   Dental Advisory Given  Plan Discussed with: CRNA and Surgeon  Anesthesia Plan Comments:         Anesthesia Quick Evaluation

## 2014-06-15 LAB — COMPREHENSIVE METABOLIC PANEL
ALT: 15 U/L (ref 0–35)
AST: 26 U/L (ref 0–37)
Albumin: 3.3 g/dL — ABNORMAL LOW (ref 3.5–5.2)
Alkaline Phosphatase: 82 U/L (ref 39–117)
Anion gap: 12 (ref 5–15)
BUN: 12 mg/dL (ref 6–23)
CO2: 22 mEq/L (ref 19–32)
Calcium: 9 mg/dL (ref 8.4–10.5)
Chloride: 99 mEq/L (ref 96–112)
Creatinine, Ser: 0.53 mg/dL (ref 0.50–1.10)
GFR calc Af Amer: >90 mL/min (ref >90)
GFR calc non Af Amer: >90 mL/min (ref >90)
Glucose, Bld: 167 mg/dL — ABNORMAL HIGH (ref 70–99)
Potassium: 5.1 mEq/L (ref 3.7–5.3)
Sodium: 133 mEq/L — ABNORMAL LOW (ref 137–147)
Total Bilirubin: 0.5 mg/dL (ref 0.3–1.2)
Total Protein: 7.2 g/dL (ref 6.0–8.3)

## 2014-06-15 MED ORDER — HYDROCODONE-ACETAMINOPHEN 5-325 MG PO TABS: 1.0000 | ORAL_TABLET | ORAL | Status: DC | PRN

## 2014-06-15 MED ORDER — BIOTENE DRY MOUTH MT LIQD
15.0000 mL | Freq: Two times a day (BID) | OROMUCOSAL | Status: DC
  Administered 2014-06-15: 15 mL via OROMUCOSAL

## 2014-06-15 NOTE — Progress Notes (Signed)
Pt leaving at this time with her husband at her side.  Alert, oriented, and without c./o. Discharge instructions/prescriptions given/explained with pt and spouse verbalizing understanding. Followup appointment noted.

## 2014-06-15 NOTE — Discharge Summary (Signed)
Physician Discharge Summary  Patient ID:  Phyllis Knight  MRN: [id number]  DOB/AGE: 74-22-1942 74 y.o.  Admit date: 06/14/2014 Discharge date: 06/15/2014  Discharge Diagnoses:  1. Gall bladder disease, Cholilithiasis  2. Arthritis   Left knee replacement - Sept 2014 - Oliin   Still has swelling in the knee  3. Fibromyalgia  4. GERD  5. HTN x 30 years  6. There is a note about uterine cancer in the chart, but the patient denies this   She had a hysterectomy in 1972 for bleeding  7. Diverticulosis  8. Left ventral hernia   This is through the lower midline scar and I do not feel much on PE.  9. Umbilical hernia  10. History of trach for pneumonia in 1968.    Active Problems:   Gall bladder disease  Operation: Procedure(s):  LAPAROSCOPIC CHOLECYSTECTOMY on 06/14/2014 - D. Jaber Dunlow  [photos in chart]  Discharged Condition: good  Hospital Course: Phyllis Knight is an 73 y.o. female whose primary care physician is Allean FoundSMITH,CANDACE THIELE, MD and who was admitted 06/14/2014 with a chief complaint of gall bladder diseae.   She was brought to the operating room on 06/14/2014 and underwent laparoscopic cholecystectomy.  She had an impacted stone in the cystic duct and I was unable to do a cholangiogram. Her CMP this AM was normal.  Her husband is in the room with her.  She is sore and not entirely ready to go home, I think that she will do fine at home.  The discharge instructions were reviewed with the patient.  Consults: None  Significant Diagnostic Studies: Results for orders placed during the hospital encounter of 06/14/14  COMPREHENSIVE METABOLIC PANEL      Result Value Ref Range   Sodium 133 (*) 137 - 147 mEq/L   Potassium 5.1  3.7 - 5.3 mEq/L   Chloride 99  96 - 112 mEq/L   CO2 22  19 - 32 mEq/L   Glucose, Bld 167 (*) 70 - 99 mg/dL   BUN 12  6 - 23 mg/dL   Creatinine, Ser 4.090.53  0.50 - 1.10 mg/dL   Calcium 9.0  8.4 - 81.110.5 mg/dL   Total Protein 7.2  6.0 - 8.3 g/dL   Albumin  3.3 (*) 3.5 - 5.2 g/dL   AST 26  0 - 37 U/L   ALT 15  0 - 35 U/L   Alkaline Phosphatase 82  39 - 117 U/L   Total Bilirubin 0.5  0.3 - 1.2 mg/dL   GFR calc non Af Amer >90  >90 mL/min   GFR calc Af Amer >90  >90 mL/min   Anion gap 12  5 - 15    Ct Abdomen Pelvis W Contrast  05/20/2014   CLINICAL DATA:  General abdominal pain. Hiatal hernia. Status post hysterectomy.  EXAM: CT ABDOMEN AND PELVIS WITH CONTRAST  TECHNIQUE: Multidetector CT imaging of the abdomen and pelvis was performed using the standard protocol following bolus administration of intravenous contrast.    IMPRESSION: 1. Mild wall thickening and/ or induration about the gallbladder with suggestion of stones and/or sludge within the gallbladder lumen. Findings may represent sequela of acute cholecystitis in the correct clinical setting. Further evaluation with dedicated right upper quadrant ultrasound may be helpful for further evaluation. 2. Colonic diverticulosis without acute diverticulitis. 3. Nonobstructive fat and small bowel containing left ventral hernia. 4. Additional small fat containing paraumbilical hernia. 5. Status post hysterectomy.   Electronically Signed   By: Sharlet SalinaBenjamin  Phill Myron M.D.   On: 05/20/2014 01:22   US Abdomen Limited  05/20/2014   CLINICAL DATA:  Right upper quadrant abdominal pain. Question for cholecystitis on CT.  EXAM: US ABDOMEN LIMITED - RIGHT UPPER QUADRANT  COMPARISON:  CT of the abdomen and pelvis performed earlier today at 10:10 a.m.  FINDINGS: Gallbladder:  Multiple stones are seen within the gallbladder; most of these appear to be lodged at the neck of the gallbladder. Underlying sludge is seen. The gallbladder wall is thickened to 6 mm, with a positive ultrasonographic Murphy's sign, suspicious for acute cholecystitis. No definite pericholecystic fluid is seen.  Common bile duct:  Diameter: 0.8 cm; this is borderline normal for the patient's age.  Liver:  No focal lesion identified. Mildly increased  parenchymal echogenicity could reflect mild fatty infiltration.  IMPRESSION: 1. Cholelithiasis, with stones lodged at the neck of the gallbladder, and underlying sludge. Gallbladder wall thickening and positive ultrasonographic Murphy's sign. Findings suspicious for acute cholecystitis. 2. Suggestion of mild fatty infiltration within the liver.   Electronically Signed   By: Roanna Raider M.D.   On: 05/20/2014 02:37   Discharge Exam:  Filed Vitals:   06/15/14 0604  BP: 122/72  Pulse: 57  Temp: 99 F (37.2 C)  Resp: 21   General: WN older AA F who is alert.  Lungs: Clear to auscultation and symmetric breath sounds. Heart:  RRR. No murmur or rub. Abdomen: Soft. No mass. Sore.  BS present.  Wounds look good.  Discharge Medications:     Medication List    ASK your doctor about these medications       acetaminophen 500 MG tablet  Commonly known as:  TYLENOL  Take 500 mg by mouth every 6 (six) hours as needed for mild pain.     amLODipine 10 MG tablet  Commonly known as:  NORVASC  Take 10 mg by mouth every morning.     aspirin EC 325 MG tablet  Take 325 mg by mouth daily.     atenolol 25 MG tablet  Commonly known as:  TENORMIN  Take 12.5 mg by mouth every morning.     benazepril 20 MG tablet  Commonly known as:  LOTENSIN  Take 20 mg by mouth every morning.     ferrous sulfate 325 (65 FE) MG tablet  Take 1 tablet (325 mg total) by mouth 3 (three) times daily after meals.     omeprazole 20 MG capsule  Commonly known as:  PRILOSEC  Take 20 mg by mouth every morning.     ondansetron 8 MG disintegrating tablet  Commonly known as:  ZOFRAN ODT  Take 1 tablet (8 mg total) by mouth every 8 (eight) hours as needed for nausea or vomiting.     oxyCODONE 5 MG immediate release tablet  Commonly known as:  ROXICODONE  Take 1 tablet (5 mg total) by mouth every 4 (four) hours as needed for severe pain.     pregabalin 100 MG capsule  Commonly known as:  LYRICA  Take 100 mg by mouth  3 (three) times daily.     vitamin D (CHOLECALCIFEROL) 400 UNITS tablet  Take 400 Units by mouth daily. D 3       Disposition: 01-Home or Self Care   Activity:  Driving - May drive in 2 or 3 days, if doing well   Lifting - No lifting more than 15 pounds for one week, then no limit  Wound Care:   May shower tomorrow (7/16)  Diet:  As tolerated  Follow up appointment:  Call Dr. Allene Pyo office [facility]) at [phone] for an appointment in 2 to 3 weeks.  Medications and dosages:  Resume your home medications.   Signed: Ovidio Kin, M.D., [facility] Office:  [phone]  06/15/2014, 8:11 AM

## 2014-06-15 NOTE — Discharge Instructions (Signed)
[facility] - DISCHARGE INSTRUCTIONS TO PATIENT  Activity:  Driving - May drive in 2 or 3 days, if doing well   Lifting - No lifting more than 15 pounds for one week, then no limit  Wound Care:   May shower tomorrow (7/16)  Diet:  As tolerated  Follow up appointment:  Call Dr. Allene PyoNewman's office [facility] at [phone] for an appointment in 2 to 3 weeks.  Medications and dosages:  Resume your home medications.  You have a prescription for:  Vicodin  Call Dr. Ezzard StandingNewman or his office  [phone]) if you have:  Temperature greater than 100.4,  Persistent nausea and vomiting,  Severe uncontrolled pain,  Redness, tenderness, or signs of infection (pain, swelling, redness, odor or green/yellow discharge around the site),  Difficulty breathing, headache or visual disturbances,  Any other questions or concerns you may have after discharge.  In an emergency, call 911 or go to an Emergency Department at a nearby hospital.

## 2014-06-17 ENCOUNTER — Encounter (HOSPITAL_COMMUNITY): Payer: Self-pay | Admitting: Surgery

## 2014-07-05 ENCOUNTER — Telehealth (INDEPENDENT_AMBULATORY_CARE_PROVIDER_SITE_OTHER): Payer: Self-pay

## 2014-07-05 NOTE — Telephone Encounter (Signed)
Pt called and was given appt information.

## 2014-07-05 NOTE — Telephone Encounter (Signed)
V/M appt with Dr. Ezzard StandingNewman 07/18/14 3:30

## 2014-07-08 ENCOUNTER — Emergency Department (HOSPITAL_COMMUNITY)
Admission: EM | Admit: 2014-07-08 | Discharge: 2014-07-09 | Disposition: A | Discharge status: Home / Self Care | Payer: No Typology Code available for payment source | Attending: Emergency Medicine | Admitting: Emergency Medicine

## 2014-07-08 ENCOUNTER — Encounter (HOSPITAL_COMMUNITY): Payer: Self-pay | Admitting: Emergency Medicine

## 2014-07-08 ENCOUNTER — Emergency Department (HOSPITAL_COMMUNITY): Payer: No Typology Code available for payment source

## 2014-07-08 DIAGNOSIS — I10 Essential (primary) hypertension: Secondary | ICD-10-CM | POA: Diagnosis not present

## 2014-07-08 DIAGNOSIS — IMO0001 Reserved for inherently not codable concepts without codable children: Secondary | ICD-10-CM | POA: Diagnosis not present

## 2014-07-08 DIAGNOSIS — Z79899 Other long term (current) drug therapy: Secondary | ICD-10-CM | POA: Diagnosis not present

## 2014-07-08 DIAGNOSIS — Y9389 Activity, other specified: Secondary | ICD-10-CM | POA: Diagnosis not present

## 2014-07-08 DIAGNOSIS — Z862 Personal history of diseases of the blood and blood-forming organs and certain disorders involving the immune mechanism: Secondary | ICD-10-CM | POA: Insufficient documentation

## 2014-07-08 DIAGNOSIS — R32 Unspecified urinary incontinence: Secondary | ICD-10-CM | POA: Diagnosis not present

## 2014-07-08 DIAGNOSIS — Z7982 Long term (current) use of aspirin: Secondary | ICD-10-CM | POA: Diagnosis not present

## 2014-07-08 DIAGNOSIS — E559 Vitamin D deficiency, unspecified: Secondary | ICD-10-CM | POA: Diagnosis not present

## 2014-07-08 DIAGNOSIS — M129 Arthropathy, unspecified: Secondary | ICD-10-CM | POA: Insufficient documentation

## 2014-07-08 DIAGNOSIS — S5000XA Contusion of unspecified elbow, initial encounter: Secondary | ICD-10-CM | POA: Diagnosis present

## 2014-07-08 DIAGNOSIS — Z8701 Personal history of pneumonia (recurrent): Secondary | ICD-10-CM | POA: Diagnosis not present

## 2014-07-08 DIAGNOSIS — S139XXA Sprain of joints and ligaments of unspecified parts of neck, initial encounter: Secondary | ICD-10-CM | POA: Insufficient documentation

## 2014-07-08 DIAGNOSIS — S161XXA Strain of muscle, fascia and tendon at neck level, initial encounter: Secondary | ICD-10-CM

## 2014-07-08 DIAGNOSIS — Y92410 Unspecified street and highway as the place of occurrence of the external cause: Secondary | ICD-10-CM | POA: Diagnosis not present

## 2014-07-08 DIAGNOSIS — Z791 Long term (current) use of non-steroidal anti-inflammatories (NSAID): Secondary | ICD-10-CM | POA: Diagnosis not present

## 2014-07-08 DIAGNOSIS — Z8542 Personal history of malignant neoplasm of other parts of uterus: Secondary | ICD-10-CM | POA: Insufficient documentation

## 2014-07-08 DIAGNOSIS — S5001XA Contusion of right elbow, initial encounter: Secondary | ICD-10-CM

## 2014-07-08 DIAGNOSIS — K219 Gastro-esophageal reflux disease without esophagitis: Secondary | ICD-10-CM | POA: Insufficient documentation

## 2014-07-08 MED ORDER — NAPROXEN 500 MG PO TABS
500.0000 mg | ORAL_TABLET | Freq: Once | ORAL | Status: AC
  Administered 2014-07-09: 500 mg via ORAL
  Filled 2014-07-08: qty 1
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
## 2014-07-09 DIAGNOSIS — S5000XA Contusion of unspecified elbow, initial encounter: Secondary | ICD-10-CM | POA: Diagnosis not present

## 2014-07-09 MED ORDER — METHOCARBAMOL 500 MG PO TABS: 500.0000 mg | ORAL_TABLET | Freq: Two times a day (BID) | ORAL | Status: DC | PRN

## 2014-07-09 MED ORDER — NAPROXEN 500 MG PO TABS: 500.0000 mg | ORAL_TABLET | Freq: Two times a day (BID) | ORAL | Status: DC

## 2014-07-09 MED ORDER — METHOCARBAMOL 500 MG PO TABS
500.0000 mg | ORAL_TABLET | Freq: Once | ORAL | Status: AC
  Administered 2014-07-09: 500 mg via ORAL
  Filled 2014-07-09: qty 1

## 2014-07-09 NOTE — Discharge Instructions (Signed)
Please call your doctor for a followup appointment within 24-48 hours. When you talk to your doctor please let them know that you were seen in the emergency department and have them acquire all of your records so that they can discuss the findings with you and formulate a treatment plan to fully care for your new and ongoing problems. ° °
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
## 2014-07-18 ENCOUNTER — Ambulatory Visit (INDEPENDENT_AMBULATORY_CARE_PROVIDER_SITE_OTHER): Payer: Medicare Other | Admitting: Surgery

## 2014-07-18 ENCOUNTER — Encounter (INDEPENDENT_AMBULATORY_CARE_PROVIDER_SITE_OTHER): Payer: Self-pay | Admitting: Surgery

## 2014-07-18 VITALS — BP 138/86 | HR 76 | Temp 98.8°F | Resp 15 | Ht 66.0 in | Wt 223.8 lb

## 2014-07-18 DIAGNOSIS — K829 Disease of gallbladder, unspecified: Secondary | ICD-10-CM
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
## 2014-08-19 ENCOUNTER — Ambulatory Visit: Payer: Medicare Other | Attending: Family Medicine

## 2014-08-19 DIAGNOSIS — M542 Cervicalgia: Secondary | ICD-10-CM | POA: Diagnosis not present

## 2014-08-19 DIAGNOSIS — IMO0001 Reserved for inherently not codable concepts without codable children: Secondary | ICD-10-CM | POA: Insufficient documentation

## 2014-08-19 DIAGNOSIS — R293 Abnormal posture: Secondary | ICD-10-CM | POA: Diagnosis not present

## 2014-08-22 ENCOUNTER — Ambulatory Visit: Payer: Medicare Other | Admitting: Physical Therapy

## 2014-08-22 DIAGNOSIS — IMO0001 Reserved for inherently not codable concepts without codable children: Secondary | ICD-10-CM | POA: Diagnosis not present

## 2014-08-25 ENCOUNTER — Ambulatory Visit: Payer: Medicare Other | Admitting: Physical Therapy

## 2014-08-25 DIAGNOSIS — IMO0001 Reserved for inherently not codable concepts without codable children: Secondary | ICD-10-CM | POA: Diagnosis not present

## 2014-08-31 ENCOUNTER — Ambulatory Visit: Payer: Medicare Other | Admitting: Physical Therapy

## 2014-08-31 DIAGNOSIS — IMO0001 Reserved for inherently not codable concepts without codable children: Secondary | ICD-10-CM | POA: Diagnosis not present

## 2014-09-02 ENCOUNTER — Ambulatory Visit: Payer: Medicare Other | Attending: Family Medicine

## 2014-09-02 DIAGNOSIS — M542 Cervicalgia: Secondary | ICD-10-CM | POA: Diagnosis not present

## 2014-09-02 DIAGNOSIS — Z5189 Encounter for other specified aftercare: Secondary | ICD-10-CM | POA: Insufficient documentation

## 2014-09-02 DIAGNOSIS — R293 Abnormal posture: Secondary | ICD-10-CM | POA: Insufficient documentation

## 2014-09-05 ENCOUNTER — Ambulatory Visit: Payer: Medicare Other | Admitting: Physical Therapy

## 2014-09-05 ENCOUNTER — Encounter: Payer: Medicare Other | Admitting: Physical Therapy

## 2014-09-05 DIAGNOSIS — Z5189 Encounter for other specified aftercare: Secondary | ICD-10-CM | POA: Diagnosis not present

## 2014-09-07 ENCOUNTER — Encounter: Payer: Medicare Other | Admitting: Physical Therapy

## 2014-09-07 ENCOUNTER — Ambulatory Visit: Payer: Medicare Other | Admitting: Physical Therapy

## 2014-09-07 DIAGNOSIS — Z5189 Encounter for other specified aftercare: Secondary | ICD-10-CM | POA: Diagnosis not present

## 2014-09-12 ENCOUNTER — Ambulatory Visit: Payer: Medicare Other | Admitting: Physical Therapy

## 2014-09-12 DIAGNOSIS — Z5189 Encounter for other specified aftercare: Secondary | ICD-10-CM | POA: Diagnosis not present

## 2014-09-14 ENCOUNTER — Ambulatory Visit: Payer: Medicare Other | Admitting: Physical Therapy

## 2014-09-14 DIAGNOSIS — Z5189 Encounter for other specified aftercare: Secondary | ICD-10-CM | POA: Diagnosis not present

## 2014-09-19 ENCOUNTER — Ambulatory Visit: Payer: Medicare Other | Admitting: Physical Therapy

## 2014-09-19 DIAGNOSIS — Z5189 Encounter for other specified aftercare: Secondary | ICD-10-CM | POA: Diagnosis not present

## 2014-09-21 ENCOUNTER — Ambulatory Visit: Payer: Medicare Other | Admitting: Physical Therapy

## 2014-09-21 DIAGNOSIS — Z5189 Encounter for other specified aftercare: Secondary | ICD-10-CM | POA: Diagnosis not present

## 2014-09-26 ENCOUNTER — Ambulatory Visit: Payer: Medicare Other | Admitting: Physical Therapy

## 2014-09-26 DIAGNOSIS — Z5189 Encounter for other specified aftercare: Secondary | ICD-10-CM | POA: Diagnosis not present

## 2014-09-28 ENCOUNTER — Ambulatory Visit: Payer: Medicare Other | Admitting: Physical Therapy

## 2014-09-28 DIAGNOSIS — Z5189 Encounter for other specified aftercare: Secondary | ICD-10-CM | POA: Diagnosis not present

## 2015-04-24 ENCOUNTER — Other Ambulatory Visit: Payer: Self-pay | Admitting: Surgery

## 2015-04-25 ENCOUNTER — Other Ambulatory Visit: Payer: Self-pay | Admitting: General Surgery

## 2015-04-25 DIAGNOSIS — R1013 Epigastric pain: Secondary | ICD-10-CM

## 2015-05-07 ENCOUNTER — Emergency Department (HOSPITAL_COMMUNITY): Payer: Medicare Other

## 2015-05-07 ENCOUNTER — Emergency Department (HOSPITAL_COMMUNITY)
Admission: EM | Admit: 2015-05-07 | Discharge: 2015-05-07 | Disposition: A | Discharge status: Home / Self Care | Payer: Medicare Other | Attending: Emergency Medicine | Admitting: Emergency Medicine

## 2015-05-07 ENCOUNTER — Encounter (HOSPITAL_COMMUNITY): Payer: Self-pay | Admitting: *Deleted

## 2015-05-07 DIAGNOSIS — Z90710 Acquired absence of both cervix and uterus: Secondary | ICD-10-CM | POA: Insufficient documentation

## 2015-05-07 DIAGNOSIS — H538 Other visual disturbances: Secondary | ICD-10-CM | POA: Diagnosis not present

## 2015-05-07 DIAGNOSIS — Z8701 Personal history of pneumonia (recurrent): Secondary | ICD-10-CM | POA: Insufficient documentation

## 2015-05-07 DIAGNOSIS — I10 Essential (primary) hypertension: Secondary | ICD-10-CM | POA: Insufficient documentation

## 2015-05-07 DIAGNOSIS — Z79899 Other long term (current) drug therapy: Secondary | ICD-10-CM | POA: Insufficient documentation

## 2015-05-07 DIAGNOSIS — K219 Gastro-esophageal reflux disease without esophagitis: Secondary | ICD-10-CM | POA: Insufficient documentation

## 2015-05-07 DIAGNOSIS — R519 Headache, unspecified: Secondary | ICD-10-CM

## 2015-05-07 DIAGNOSIS — M797 Fibromyalgia: Secondary | ICD-10-CM | POA: Diagnosis not present

## 2015-05-07 DIAGNOSIS — R110 Nausea: Secondary | ICD-10-CM | POA: Insufficient documentation

## 2015-05-07 DIAGNOSIS — M1990 Unspecified osteoarthritis, unspecified site: Secondary | ICD-10-CM | POA: Diagnosis not present

## 2015-05-07 DIAGNOSIS — E559 Vitamin D deficiency, unspecified: Secondary | ICD-10-CM | POA: Insufficient documentation

## 2015-05-07 DIAGNOSIS — R51 Headache: Secondary | ICD-10-CM | POA: Diagnosis present

## 2015-05-07 DIAGNOSIS — M542 Cervicalgia: Secondary | ICD-10-CM | POA: Diagnosis not present

## 2015-05-07 DIAGNOSIS — Z791 Long term (current) use of non-steroidal anti-inflammatories (NSAID): Secondary | ICD-10-CM | POA: Insufficient documentation

## 2015-05-07 DIAGNOSIS — D649 Anemia, unspecified: Secondary | ICD-10-CM | POA: Diagnosis not present

## 2015-05-07 DIAGNOSIS — Z8673 Personal history of transient ischemic attack (TIA), and cerebral infarction without residual deficits: Secondary | ICD-10-CM | POA: Diagnosis not present

## 2015-05-07 DIAGNOSIS — Z8542 Personal history of malignant neoplasm of other parts of uterus: Secondary | ICD-10-CM | POA: Diagnosis not present

## 2015-05-07 DIAGNOSIS — Z7982 Long term (current) use of aspirin: Secondary | ICD-10-CM | POA: Insufficient documentation

## 2015-05-07 LAB — CBC WITH DIFFERENTIAL/PLATELET
Basophils Absolute: 0 10*3/uL (ref 0.0–0.1)
Basophils Relative: 0 % (ref 0–1)
Eosinophils Absolute: 0.1 10*3/uL (ref 0.0–0.7)
Eosinophils Relative: 2 % (ref 0–5)
HCT: 38 % (ref 36.0–46.0)
Hemoglobin: 12.8 g/dL (ref 12.0–15.0)
Lymphocytes Relative: 35 % (ref 12–46)
Lymphs Abs: 2.1 10*3/uL (ref 0.7–4.0)
MCH: 31.4 pg (ref 26.0–34.0)
MCHC: 33.7 g/dL (ref 30.0–36.0)
MCV: 93.1 fL (ref 78.0–100.0)
Monocytes Absolute: 0.6 10*3/uL (ref 0.1–1.0)
Monocytes Relative: 10 % (ref 3–12)
Neutro Abs: 3.2 10*3/uL (ref 1.7–7.7)
Neutrophils Relative %: 53 % (ref 43–77)
Platelets: 242 10*3/uL (ref 150–400)
RBC: 4.08 MIL/uL (ref 3.87–5.11)
RDW: 12.3 % (ref 11.5–15.5)
WBC: 6 10*3/uL (ref 4.0–10.5)

## 2015-05-07 LAB — BASIC METABOLIC PANEL
Anion gap: 8 (ref 5–15)
BUN: 10 mg/dL (ref 6–20)
CO2: 28 mmol/L (ref 22–32)
Calcium: 8.6 mg/dL — ABNORMAL LOW (ref 8.9–10.3)
Chloride: 105 mmol/L (ref 101–111)
Creatinine, Ser: 0.55 mg/dL (ref 0.44–1.00)
GFR calc Af Amer: >60 mL/min (ref >60)
GFR calc non Af Amer: >60 mL/min (ref >60)
Glucose, Bld: 90 mg/dL (ref 65–99)
Potassium: 3.4 mmol/L — ABNORMAL LOW (ref 3.5–5.1)
Sodium: 141 mmol/L (ref 135–145)

## 2015-05-07 MED ORDER — METHOCARBAMOL 500 MG PO TABS
500.0000 mg | ORAL_TABLET | Freq: Once | ORAL | Status: AC
  Administered 2015-05-07: 500 mg via ORAL
  Filled 2015-05-07: qty 1

## 2015-05-07 MED ORDER — METHOCARBAMOL 500 MG PO TABS: 500.0000 mg | ORAL_TABLET | Freq: Two times a day (BID) | ORAL | Status: DC | PRN

## 2015-05-07 MED ORDER — DIPHENHYDRAMINE HCL 50 MG/ML IJ SOLN
12.5000 mg | Freq: Once | INTRAMUSCULAR | Status: AC
  Administered 2015-05-07: 12.5 mg via INTRAVENOUS
  Filled 2015-05-07: qty 1

## 2015-05-07 MED ORDER — SODIUM CHLORIDE 0.9 % IV BOLUS (SEPSIS)
1000.0000 mL | Freq: Once | INTRAVENOUS | Status: AC
  Administered 2015-05-07: 1000 mL via INTRAVENOUS

## 2015-05-07 MED ORDER — METOCLOPRAMIDE HCL 5 MG/ML IJ SOLN
5.0000 mg | Freq: Once | INTRAMUSCULAR | Status: AC
  Administered 2015-05-07: 5 mg via INTRAVENOUS
  Filled 2015-05-07: qty 2

## 2015-05-07 NOTE — ED Notes (Addendum)
Pt reported neck and headache since this morning. Pt reported dizziness/lightheadedness. No nausea or blurred/double vision. NIH scale 0.

## 2015-05-07 NOTE — ED Provider Notes (Signed)
CSN: [id number]     Arrival date & time 05/07/15  1540 History   First MD Initiated Contact with Patient 05/07/15 1626     Chief Complaint  Patient presents with  . Headache   Phyllis Knight is a 74 y.o. female with a history of hypertension, GERD, hiatal hernia who presents emergency department complaining of a headache since awakening this morning. Patient reports she woke up with a left-sided headache that extends back into her left side of head. Patient reports this morning her headache was a 10 out of 10 but currently it is a 7 out of 10. She describes as an ache. She reports taking Aleve this morning with some relief. She also reports some intermittent blurry vision and floaters in her vision. She also complains of some generalized weakness and nausea currently. She denies vomiting. She has reports of slight shortness of breath intermittently today but none currently. She reports she's had headaches before but this feels different. She has history of CVA or TIAs. Patient denies fevers, chills, numbness, tingling, cough, wheezing, vomiting, abdominal pain, ear pain, sore throat, or difficulty walking.  (Consider location/radiation/quality/duration/timing/severity/associated sxs/prior Treatment) HPI  Past Medical History  Diagnosis Date  . Arthritis   . Fibromyalgia   . Hypertension   . GERD (gastroesophageal reflux disease)   . H/O hiatal hernia   . Bradycardia   . 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 Urge urinary incontinence   . History of uterine cancer 1975    s/p hysterectomy  . Anemia     on iron  . Vitamin D deficiency   . Pneumonia yrs ago  . Complication of anesthesia     trouble turing neck to right  . Chest pain 06-10-14    had chest pain thia am   Past Surgical History  Procedure Laterality Date  . Tympanoplasty Left   . Total knee arthroplasty Left 08/03/2013    Procedure: LEFT TOTAL KNEE ARTHROPLASTY;  Surgeon: Shelda PalMatthew D Olin, MD;   Location: [facility];  Service: Orthopedics;  Laterality: Left;  . Total abdominal hysterectomy w/ bilateral salpingoophorectomy  1972  . Tracheotomy    . Cervical fusion  2011  . Knee closed reduction Left 09/21/2013    Procedure: CLOSED MANIPULATION LEFT KNEE;  Surgeon: Shelda PalMatthew D Olin, MD;  [facility];  Service: Orthopedics;  Laterality: Left;  . Cholecystectomy N/A 06/14/2014    Procedure: LAPAROSCOPIC CHOLECYSTECTOMY WITH attempted INTRAOPERATIVE CHOLANGIOGRAM;  Surgeon: Kandis Cockingavid H Newman, MD;  Location: [facility];  Service: General;  Laterality: N/A;   Family History  Problem Relation Age of Onset  . Stroke Mother   . Stroke Father    History  Substance Use Topics  . Smoking status: Never Smoker   . Smokeless tobacco: Never Used  . Alcohol Use: No   OB History    No data available     Review of Systems  Constitutional: Negative for fever and chills.  HENT: Negative for congestion, ear pain, hearing loss, sore throat and trouble swallowing.   Eyes: Positive for visual disturbance. Negative for photophobia and pain.  Respiratory: Negative for cough and wheezing.   Cardiovascular: Negative for chest pain, palpitations and leg swelling.  Gastrointestinal: Positive for nausea. Negative for vomiting, abdominal pain and diarrhea.  Genitourinary: Negative for dysuria, hematuria and difficulty urinating.  Musculoskeletal: Positive for neck pain. Negative for back pain and neck stiffness.  Skin: Negative for rash.  Neurological:  Positive for headaches. Negative for dizziness, weakness, light-headedness and numbness.      Allergies  Codeine and Zofran  Home Medications   Prior to Admission medications   Medication Sig Start Date End Date Taking? Authorizing Provider  amLODipine (NORVASC) 10 MG tablet Take 10 mg by mouth every morning.   Yes Historical Provider, MD  aspirin EC 325 MG tablet Take 325 mg by mouth daily.   Yes Historical Provider, MD  atenolol  (TENORMIN) 25 MG tablet Take 12.5 mg by mouth every morning.   Yes Historical Provider, MD  benazepril (LOTENSIN) 20 MG tablet Take 20 mg by mouth every morning.   Yes Historical Provider, MD  furosemide (LASIX) 20 MG tablet Take 20 mg by mouth daily.  03/14/15  Yes Historical Provider, MD  naproxen sodium (ANAPROX) 220 MG tablet Take 220 mg by mouth daily as needed (head pain).   Yes Historical Provider, MD  omeprazole (PRILOSEC) 20 MG capsule Take 20 mg by mouth every morning.    Yes Historical Provider, MD  potassium chloride (KLOR-CON) 8 MEQ tablet Take 8 mEq by mouth daily.  04/17/15  Yes Historical Provider, MD  pregabalin (LYRICA) 100 MG capsule Take 100 mg by mouth 3 (three) times daily.   Yes Historical Provider, MD  traMADol (ULTRAM) 50 MG tablet Take 50 mg by mouth every 6 (six) hours as needed for moderate pain or severe pain (pain).   Yes Historical Provider, MD  vitamin D, CHOLECALCIFEROL, 400 UNITS tablet Take 400 Units by mouth daily. D 3   Yes Historical Provider, MD  acetaminophen (TYLENOL) 500 MG tablet Take 500 mg by mouth every 6 (six) hours as needed for mild pain.    Historical Provider, MD  HYDROcodone-acetaminophen (NORCO/VICODIN) 5-325 MG per tablet Take 1-2 tablets by mouth every 4 (four) hours as needed for moderate pain. Patient not taking: Reported on 05/07/2015 06/15/14   Ovidio Kin, MD  methocarbamol (ROBAXIN) 500 MG tablet Take 1 tablet (500 mg total) by mouth 2 (two) times daily as needed for muscle spasms. 05/07/15   Everlene Farrier, PA-C  naproxen (NAPROSYN) 500 MG tablet Take 1 tablet (500 mg total) by mouth 2 (two) times daily with a meal. Patient not taking: Reported on 05/07/2015 07/09/14   Eber Hong, MD  oxyCODONE (ROXICODONE) 5 MG immediate release tablet Take 1 tablet (5 mg total) by mouth every 4 (four) hours as needed for severe pain. Patient not taking: Reported on 05/07/2015 06/07/14   Darnell Level, MD   BP 163/86 mmHg  Pulse 59  Temp(Src) 97.7 F (36.5 C)  (Oral)  Resp 16  SpO2 99% Physical Exam  Constitutional: She is oriented to person, place, and time. She appears well-developed and well-nourished. No distress.  Nontoxic appearing.  HENT:  Head: Normocephalic and atraumatic.  Right Ear: External ear normal.  Left Ear: External ear normal.  Mouth/Throat: Oropharynx is clear and moist. No oropharyngeal exudate.  Eyes: Conjunctivae and EOM are normal. Pupils are equal, round, and reactive to light. Right eye exhibits no discharge. Left eye exhibits no discharge.  EOMs intact bilaterally. Slight horizontal nystagmus noted.  Neck: Normal range of motion. Neck supple. No JVD present. No tracheal deviation present.  Tenderness noted to the left posterior aspect of her head in occipital area. No midline neck tenderness. Full range of motion of her neck. No meningeal signs.   Cardiovascular: Normal rate, regular rhythm, normal heart sounds and intact distal pulses.  Exam reveals no gallop and no friction rub.  No murmur heard. Pulmonary/Chest: Effort normal and breath sounds normal. No respiratory distress. She has no wheezes. She has no rales.  Abdominal: Soft. She exhibits no distension. There is no tenderness.  Musculoskeletal: She exhibits no edema.  Patient is spontaneously moving all extremities in a coordinated fashion exhibiting good strength. Patient is able to ambulate without difficulty or assistance. She has 5 out of 5 strength in her bilateral upper extremities.   Lymphadenopathy:    She has no cervical adenopathy.  Neurological: She is alert and oriented to person, place, and time. No cranial nerve deficit. Coordination normal.  Cranial nerves are intact bilaterally. Sensation intact her bilateral upper and lower extremities. No pronator drift. Finger to nose intact bilaterally. No abnormal coordination.  Skin: Skin is warm and dry. No rash noted. She is not diaphoretic. No erythema. No pallor.  Psychiatric: She has a normal mood and  affect. Her behavior is normal.  Nursing note and vitals reviewed.   ED Course  Procedures (including critical care time) Labs Review Labs Reviewed  BASIC METABOLIC PANEL - Abnormal; Notable for the following:    Potassium 3.4 (*)    Calcium 8.6 (*)    All other components within normal limits  CBC WITH DIFFERENTIAL/PLATELET    Imaging Review Ct Head Wo Contrast  05/07/2015   CLINICAL DATA:  Severe headaches radiating to neck. Nausea today. History of fibromyalgia.  EXAM: CT HEAD WITHOUT CONTRAST  TECHNIQUE: Contiguous axial images were obtained from the base of the skull through the vertex without intravenous contrast.  COMPARISON:  None.  FINDINGS: No evidence for acute infarction, hemorrhage, mass lesion, hydrocephalus, or extra-axial fluid. No atrophy or white matter disease. Intact calvarium. No acute sinus or mastoid disease. Mild vascular calcification.  In the LEFT suboccipital scalp soft tissues, see for instance image 7 and 8, there is asymmetric subgaleal attenuation which could represent unreported head trauma or inflammatory process. No osseous erosion. Correlate clinically.  IMPRESSION: No acute or focal intracranial abnormality.  Asymmetric soft tissue in the LEFT suboccipital region scalp of uncertain significance. No osseous erosion.   Electronically Signed   By: Davonna Belling M.D.   On: 05/07/2015 18:03     EKG Interpretation None      Filed Vitals:   05/07/15 1617 05/07/15 1834 05/07/15 2044 05/07/15 2100  BP: 139/68  141/76 163/86  Pulse: 60  61 59  Temp: 97.7 F (36.5 C)     TempSrc: Oral     Resp: SpO2: 98% 98% 97% 99%     MDM   Meds given in ED:  Medications  metoCLOPramide (REGLAN) injection 5 mg (5 mg Intravenous Given 05/07/15 1848)  diphenhydrAMINE (BENADRYL) injection 12.5 mg (12.5 mg Intravenous Given 05/07/15 1848)  sodium chloride 0.9 % bolus 1,000 mL (0 mLs Intravenous Stopped 05/07/15 2049)  methocarbamol (ROBAXIN) tablet 500 mg (500 mg  Oral Given 05/07/15 2056)    New Prescriptions   METHOCARBAMOL (ROBAXIN) 500 MG TABLET    Take 1 tablet (500 mg total) by mouth 2 (two) times daily as needed for muscle spasms.    Final diagnoses:  Bad headache   This  is a 74 y.o. female with a history of hypertension, GERD, hiatal hernia who presents emergency department complaining of a headache since awakening this morning. Patient reports she woke up with a left-sided headache that extends back into her left side of head. Patient reports this morning her headache was a 10 out of 10  but currently it is a 7 out of 10. She describes as an ache. On exam the patient is afebrile and nontoxic appearing. She has no further neurological deficits. She does have tenderness to her posterior head and reports some pain with movement of her neck. She does have full range of motion of her neck. No meningeal signs. BMP and CBC are unremarkable. CT head without contrast was obtained which showed no acute intracranial abnormality. It did show asymmetric soft tissue in the left suboccipital region of the scalp. This correlates with the patient's physical exam finding of point tenderness over her left suboccipital region where her muscles appear to be in spasm. This is likely contributing to the headache. Patient was given fluid bolus, Reglan and Benadryl initially. Patient reports some improvement but still complaining of 6 out of 10 pain. Patient given Robaxin and she reports improvement. At second and third reevaluation the patient was found sleeping in the room. She reports feeling ready for discharge and feeling much better. She denies changes to her vision or lightheadedness prior to discharge. We'll discharge with a prescription for Robaxin. I advised to use caution when taking Robaxin as it can make her drowsy. I advised the patient to follow-up with their primary care provider this week. I advised the patient to return to the emergency department with new or  worsening symptoms or new concerns. The patient verbalized understanding and agreement with plan.     This patient was discussed with Dr. Judd Lien who agrees with assessment and plan.     Everlene Farrier, PA-C 05/07/15 2230  Geoffery Lyons, MD 05/07/15 2255

## 2015-05-07 NOTE — Discharge Instructions (Signed)

## 2015-05-07 NOTE — ED Notes (Signed)
Pt was sent here from [facility] for head [facility], due to severe headache radiating to neck and nausea.

## 2015-05-17 ENCOUNTER — Inpatient Hospital Stay (HOSPITAL_COMMUNITY): Admit: 2015-05-17 | Payer: No Typology Code available for payment source | Admitting: Surgery

## 2015-05-17 ENCOUNTER — Encounter (HOSPITAL_COMMUNITY): Payer: Self-pay

## 2015-05-17 SURGERY — REPAIR, HERNIA, VENTRAL, LAPAROSCOPIC
Anesthesia: General

## 2015-06-15 ENCOUNTER — Other Ambulatory Visit: Payer: Self-pay | Admitting: Surgery

## 2015-07-13 ENCOUNTER — Encounter (HOSPITAL_COMMUNITY)
Admission: RE | Admit: 2015-07-13 | Discharge: 2015-07-13 | Disposition: A | Discharge status: Home / Self Care | Payer: Medicare Other | Source: Ambulatory Visit | Attending: Surgery | Admitting: Surgery

## 2015-07-13 ENCOUNTER — Ambulatory Visit (HOSPITAL_COMMUNITY)
Admission: RE | Admit: 2015-07-13 | Discharge: 2015-07-13 | Disposition: A | Discharge status: Home / Self Care | Payer: Medicare Other | Source: Ambulatory Visit | Attending: Anesthesiology | Admitting: Anesthesiology

## 2015-07-13 ENCOUNTER — Encounter (HOSPITAL_COMMUNITY): Payer: Self-pay

## 2015-07-13 DIAGNOSIS — R0602 Shortness of breath: Secondary | ICD-10-CM

## 2015-07-13 DIAGNOSIS — K460 Unspecified abdominal hernia with obstruction, without gangrene: Secondary | ICD-10-CM | POA: Insufficient documentation

## 2015-07-13 DIAGNOSIS — Z01818 Encounter for other preprocedural examination: Secondary | ICD-10-CM | POA: Diagnosis present

## 2015-07-13 HISTORY — DX: Anesthesia of skin: R20.2

## 2015-07-13 HISTORY — DX: Unspecified urinary incontinence: R32

## 2015-07-13 HISTORY — DX: Dizziness and giddiness: R42

## 2015-07-13 HISTORY — DX: Anesthesia of skin: R20.0

## 2015-07-13 HISTORY — DX: Unspecified hearing loss, unspecified ear: H91.90

## 2015-07-13 HISTORY — DX: Tinnitus, unspecified ear: H93.19

## 2015-07-13 HISTORY — DX: Bronchitis, not specified as acute or chronic: J40

## 2015-07-13 HISTORY — DX: Reserved for inherently not codable concepts without codable children: IMO0001

## 2015-07-13 LAB — COMPREHENSIVE METABOLIC PANEL
ALT: 11 U/L — ABNORMAL LOW (ref 14–54)
AST: 17 U/L (ref 15–41)
Albumin: 4.1 g/dL (ref 3.5–5.0)
Alkaline Phosphatase: 67 U/L (ref 38–126)
Anion gap: 5 (ref 5–15)
BUN: 15 mg/dL (ref 6–20)
CO2: 29 mmol/L (ref 22–32)
Calcium: 9.3 mg/dL (ref 8.9–10.3)
Chloride: 105 mmol/L (ref 101–111)
Creatinine, Ser: 0.67 mg/dL (ref 0.44–1.00)
GFR calc Af Amer: >60 mL/min (ref >60)
GFR calc non Af Amer: >60 mL/min (ref >60)
Glucose, Bld: 93 mg/dL (ref 65–99)
Potassium: 4.5 mmol/L (ref 3.5–5.1)
Sodium: 139 mmol/L (ref 135–145)
Total Bilirubin: 0.6 mg/dL (ref 0.3–1.2)
Total Protein: 7.8 g/dL (ref 6.5–8.1)

## 2015-07-13 LAB — CBC WITH DIFFERENTIAL/PLATELET
Basophils Absolute: 0 10*3/uL (ref 0.0–0.1)
Basophils Relative: 0 % (ref 0–1)
Eosinophils Absolute: 0.1 10*3/uL (ref 0.0–0.7)
Eosinophils Relative: 2 % (ref 0–5)
HCT: 39.5 % (ref 36.0–46.0)
Hemoglobin: 13.2 g/dL (ref 12.0–15.0)
Lymphocytes Relative: 34 % (ref 12–46)
Lymphs Abs: 1.3 10*3/uL (ref 0.7–4.0)
MCH: 31.5 pg (ref 26.0–34.0)
MCHC: 33.4 g/dL (ref 30.0–36.0)
MCV: 94.3 fL (ref 78.0–100.0)
Monocytes Absolute: 0.3 10*3/uL (ref 0.1–1.0)
Monocytes Relative: 7 % (ref 3–12)
Neutro Abs: 2.1 10*3/uL (ref 1.7–7.7)
Neutrophils Relative %: 57 % (ref 43–77)
Platelets: 237 10*3/uL (ref 150–400)
RBC: 4.19 MIL/uL (ref 3.87–5.11)
RDW: 12.4 % (ref 11.5–15.5)
WBC: 3.7 10*3/uL — ABNORMAL LOW (ref 4.0–10.5)

## 2015-07-13 NOTE — Patient Instructions (Addendum)
KIORA HALLBERG  07/13/2015   Your procedure is scheduled on: Tuesday July 18, 2015   Report to [facility] Main  Entrance take [place]  elevators to 3rd floor to  Short Stay Center at 5:30 AM.  Call this number if you have problems the morning of surgery [phone]   Remember: ONLY 1 PERSON MAY GO WITH YOU TO SHORT STAY TO GET  READY MORNING OF YOUR SURGERY.  Do not eat food or drink liquids :After Midnight.     Take these medicines the morning of surgery with A SIP OF WATER: Amlodipine (Norvasc); Atenolol (Tenormin); Omeprazole (Prilosec); Pregabalin (Lyrica)                DO NOT TAKE ASPIRIN OR ANY  HERBAL MEDICATIONS 5 TO 7 DAYS PRIOR TO SURGERY.                               You may not have any metal on your body including hair pins and              piercings  Do not wear jewelry, make-up, lotions, powders or perfumes, deodorant             Do not wear nail polish.  Do not shave  48 hours prior to surgery.              Do not bring valuables to the hospital. [facility] IS NOT             RESPONSIBLE   FOR VALUABLES.  Contacts, dentures or bridgework may not be worn into surgery.  Leave suitcase in the car. After surgery it may be brought to your room.     _____________________________________________________________________             [facility] - Preparing for Surgery Before surgery, you can play an important role.  Because skin is not sterile, your skin needs to be as free of germs as possible.  You can reduce the number of germs on your skin by washing with CHG (chlorahexidine gluconate) soap before surgery.  CHG is an antiseptic cleaner which kills germs and bonds with the skin to continue killing germs even after washing. Please DO NOT use if you have an allergy to CHG or antibacterial soaps.  If your skin becomes reddened/irritated stop using the CHG and inform your nurse when you arrive at Short Stay. Do not shave (including legs and  underarms) for at least 48 hours prior to the first CHG shower.  You may shave your face/neck. Please follow these instructions carefully:  1.  Shower with CHG Soap the night before surgery and the  morning of Surgery.  2.  If you choose to wash your hair, wash your hair first as usual with your  normal  shampoo.  3.  After you shampoo, rinse your hair and body thoroughly to remove the  shampoo.                           4.  Use CHG as you would any other liquid soap.  You can apply chg directly  to the skin and wash                       Gently with a scrungie or clean  washcloth.  5.  Apply the CHG Soap to your body ONLY FROM THE NECK DOWN.   Do not use on face/ open                           Wound or open sores. Avoid contact with eyes, ears mouth and genitals (private parts).                       Wash face,  Genitals (private parts) with your normal soap.             6.  Wash thoroughly, paying special attention to the area where your surgery  will be performed.  7.  Thoroughly rinse your body with warm water from the neck down.  8.  DO NOT shower/wash with your normal soap after using and rinsing off  the CHG Soap.                9.  Pat yourself dry with a clean towel.            10.  Wear clean pajamas.            11.  Place clean sheets on your bed the night of your first shower and do not  sleep with pets. Day of Surgery : Do not apply any lotions/deodorants the morning of surgery.  Please wear clean clothes to the hospital/surgery center.  FAILURE TO FOLLOW THESE INSTRUCTIONS MAY RESULT IN THE CANCELLATION OF YOUR SURGERY PATIENT SIGNATURE_________________________________  NURSE SIGNATURE__________________________________  ________________________________________________________________________

## 2015-07-13 NOTE — Progress Notes (Addendum)
CT abd/pelvis with contrast - care everywhere - epic 04/20/2015  Pt stated she has been off her furosemide (lasix) over the last two weeks due to her choice. States it "makes me urinate too much and now I am wetting myself". Pt stated her ankles have been swelling and she is SOB.  Discussed importance of not discontinuing medications prior to notifying her MD. Pt verbalized understanding. Pt states she will contact her MD today in regards to this medication. This nurse also discussed notifying  MD of her being out of her potassium presciption. Again pt verbalized understanding.

## 2015-07-17 NOTE — H&P (Signed)
Phyllis Knight 04/27/2015 10:58 AM Location: [facility] Patient #: [id number] DOB: October 09, 1941 Married / Language: English / Race: Black or African American Female  History of Present Illness  Patient words: hernia.   The patient is a 74 year old female who presents for an evaluation of a hernia.  Her PCP is Dr. Merri Knight  She is accompanied by her husband.   She is referred here for evaluation of a incisional hernia. She actually saw Dr. Magnus Knight the other day for the hernia and he referred her to me. Upon further investigation, this hernia was actually seen on CT scan 05/20/2014. She also had cholecystitis and Dr. [name] did a lap cholecystectomy on her on 06/15/2015. He could not address the hernia at that time. She now is having increasing symptoms from the hernia. Though the hernia has been there for over a year, she dates her symptoms to only the last 3 weeks.  She had a hysterectomy in 1972 and it is through the hysterectomy scar that she has a hernia.  She has multiple symptoms, which most, I think, are not related to the abdominal hernia. 1. Heartburn - she is on omeparazole 2. Abdominal pain x 3 weeks - vague and non specific 3. HA due to stress - though she was unclear about what was causing her stress 4. has some trouble eating solid food - she is fairly non specific about her symptoms 5. She is peeing a lot - she is on a diuretic 6. Back pain - chronic  I discussed the indications and complications of hernia surgery with the patient. I discussed both the laparoscopic and open approach to hernia repair.. The potential risks of hernia surgery include, but are not limited to, bleeding, infection, open surgery, nerve injury, and recurrence of the hernia. I provided the patient literature about hernia surgery. She shows some lack of understanding of what is going on. I can fix the hernia, but it will not solve most  of her complaints. She does take tramadol - I think for back pain. We left it that she will talk to Dr. Katrinka Blazing about going ahead with surgery and she will call us back.  Past Medical History: 1. Lap chole with IOC - 06/14/2014 - D. Shantoria Ellwood 2. Arthritis  Left knee replacement - Sept 2014 - Oliin  Still has swelling in the knee  3. Fibromyalgia  4. GERD  5. HTN x 30 years  6. There is a note about uterine cancer in the chart, but the patient denies this  She had a hysterectomy in 1972 for bleeding  7. Diverticulosis  8. Left ventral hernia  9. Umbilical hernia  10. History of trach for pneumonia in 1968.  11. knee surgery - 2014 - [facility]  SOCIAL and FAMILY HISTORY: Married. Husband, [name], with her. Children: 38 daughter, 20 daughter, 72 son, and 30 daughter   She is retired from [employer] about 10 years ago.  Addendum Note(Phyllis Knight H. [name] MD; 06/12/2015 1:06 PM) She called and canceled surgery. I do not know when she was scheduled. But if she sets up surgery beyond 3 months from when I last saw her, I'll need to see her again.  DN 06/12/2015  Problem List/Past Medical Phyllis Cocking, MD; 04/27/2015 11:34 AM) [name] HERNIA (553.21  K43.2)  Other Problems Phyllis Cocking, MD; 04/27/2015 11:34 AM) Arthritis Back Pain Bladder Problems Chest pain Gastroesophageal Reflux Disease High blood pressure Umbilical Hernia Repair  Past Surgical History Phyllis Knight  [name], MD; 04/27/2015 11:34 AM) Colon Polyp Removal - Colonoscopy Gallbladder Surgery - Open Hysterectomy (not due to cancer) - Partial Knee Surgery Left.  Diagnostic Studies History Phyllis Cocking, MD; 04/27/2015 11:34 AM) Colonoscopy 1-5 years ago Mammogram 1-3 years ago Pap Smear 1-5 years ago  Allergies [name], LPN; 1/61/0960 45:40 AM) No Known Drug Allergies05/26/2016 (Marked as Inactive) Codeine Phosphate *ANALGESICS -  OPIOID* Zofran *ANTIEMETICS*  Medication History [name], LPN; 9/81/1914 78:29 AM) Medications Reconciled AmLODIPine Besylate (10MG  Tablet, Oral) Active. TraMADol HCl (50MG  Tablet, Oral) Active. Atenolol (25MG  Tablet, Oral) Active. Benazepril HCl (20MG  Tablet, Oral) Active. Furosemide (20MG  Tablet, Oral) Active. Potassium Chloride ER ( Tablet ER, Oral) Active. Omeprazole (20MG  Capsule DR, Oral) Active. MetroNIDAZOLE (500MG  Tablet, Oral) Active.  Social History Phyllis Cocking, MD; 04/27/2015 11:34 AM) Caffeine use Coffee. No alcohol use No drug use Tobacco use Never smoker.  Family History Phyllis Cocking, MD; 04/27/2015 11:34 AM) Arthritis Father, Mother. Diabetes Mellitus Father. Hypertension Father.  Pregnancy / Birth History Phyllis Cocking, MD; 04/27/2015 11:34 AM) Age at menarche 11 years. Gravida 4 Maternal age 36-20 Para 4  Review of Systems Phyllis Knight. [name] MD; 04/27/2015 11:34 AM) General Present- Chills, Night Sweats and Weight Gain. Not Present- Appetite Loss, Fatigue, Fever and Weight Loss. Skin Present- Dryness. Not Present- Change in Wart/Mole, Hives, Jaundice, New Lesions, Non-Healing Wounds, Rash and Ulcer. HEENT Present- Hoarseness and Ringing in the Ears. Not Present- Earache, Hearing Loss, Nose Bleed, Oral Ulcers, Seasonal Allergies, Sinus Pain, Sore Throat, Visual Disturbances, Wears glasses/contact lenses and Yellow Eyes. Respiratory Present- Bloody sputum. Not Present- Chronic Cough, Difficulty Breathing, Snoring and Wheezing. Cardiovascular Present- Chest Pain, Leg Cramps and Swelling of Extremities. Not Present- Difficulty Breathing Lying Down, Palpitations, Rapid Heart Rate and Shortness of Breath. Gastrointestinal Present- Abdominal Pain, Difficulty Swallowing, Excessive gas, Gets full quickly at meals, Indigestion, Nausea, Rectal Pain and Vomiting. Not Present- Bloating, Bloody Stool, Change in Bowel Habits, Chronic  diarrhea, Constipation and Hemorrhoids. Female Genitourinary Present- Pelvic Pain. Not Present- Frequency, Nocturia, Painful Urination and Urgency. Musculoskeletal Present- Back Pain, Joint Pain, Muscle Pain and Muscle Weakness. Not Present- Joint Stiffness and Swelling of Extremities. Neurological Present- Headaches, Tremor, Trouble walking and Weakness. Not Present- Decreased Memory, Fainting, Numbness, Seizures and Tingling. Psychiatric Present- Depression. Not Present- Anxiety, Bipolar, Change in Sleep Pattern, Fearful and Frequent crying. Endocrine Present- Excessive Hunger and Hot flashes. Not Present- Cold Intolerance, Hair Changes, Heat Intolerance and New Diabetes.   Vitals Florentina Addison Turner LPN; 5/62/1308 65:78 AM) 04/27/2015 11:01 AM Weight: 236 lb Height: 66in Body Surface Area: 2.23 m Body Mass Index: 38.09 kg/m Pulse: 67 (Regular)  BP: 116/80 (Sitting, Left Arm, Standard)   Physical Exam  General: Obese AA F alert. HEENT: Normal. Pupils equal.  Neck: Supple. No mass. No thyroid mass. Lymph Nodes: No supraclavicular or cervical nodes.  Lungs: Clear to auscultation and symmetric breath sounds. Heart: RRR. No murmur or rub.  Abdomen: Soft. No hernia. Normal bowel sounds. She has a well healed lower midline incision  I can not feel the umbilical hernia well at all. She does have a hernia in the LLQ, it feels to be about 7 to 8 cm. It is reducible when she is supine. Rectal: Not done.  Extremities: Good strength and ROM in upper and lower extremities.  Neurologic: Grossly intact to motor and sensory function.  Assessment & Plan 1.  INCISIONAL HERNIA (553.21  K43.2)  Impression: She wants to talk to Dr. Katrinka Blazing before proceeding  with the surgery.  2. Arthritis  Left knee replacement - Sept 2014 - [facility]  Still has swelling in the knee  3. Fibromyalgia  4. GERD  5. HTN x 30 years  6. There is a note about uterine cancer in the chart,  but the patient denies this  She had a hysterectomy in 1972 for bleeding  7. Diverticulosis  8. Left ventral hernia  9. Umbilical hernia  10. History of trach for pneumonia in 1968.   Ovidio Kin, MD, [facility] Surgery Pager: [phone] Office phone:  [phone]

## 2015-07-18 ENCOUNTER — Inpatient Hospital Stay (HOSPITAL_COMMUNITY): Payer: Medicare Other | Admitting: Certified Registered Nurse Anesthetist

## 2015-07-18 ENCOUNTER — Encounter (HOSPITAL_COMMUNITY): Payer: Self-pay | Admitting: *Deleted

## 2015-07-18 ENCOUNTER — Inpatient Hospital Stay (HOSPITAL_COMMUNITY)
Admission: RE | Admit: 2015-07-18 | Discharge: 2015-07-23 | DRG: 337 | Disposition: A | Discharge status: Home Health | Payer: Medicare Other | Source: Ambulatory Visit | Attending: Surgery | Admitting: Surgery

## 2015-07-18 ENCOUNTER — Encounter (HOSPITAL_COMMUNITY)
Admission: RE | Disposition: A | Discharge status: Home Health | Payer: Self-pay | Source: Ambulatory Visit | Attending: Surgery

## 2015-07-18 DIAGNOSIS — Z8542 Personal history of malignant neoplasm of other parts of uterus: Secondary | ICD-10-CM | POA: Diagnosis not present

## 2015-07-18 DIAGNOSIS — K5790 Diverticulosis of intestine, part unspecified, without perforation or abscess without bleeding: Secondary | ICD-10-CM | POA: Diagnosis present

## 2015-07-18 DIAGNOSIS — K436 Other and unspecified ventral hernia with obstruction, without gangrene: Secondary | ICD-10-CM | POA: Diagnosis present

## 2015-07-18 DIAGNOSIS — K429 Umbilical hernia without obstruction or gangrene: Secondary | ICD-10-CM | POA: Diagnosis present

## 2015-07-18 DIAGNOSIS — Z8261 Family history of arthritis: Secondary | ICD-10-CM | POA: Diagnosis not present

## 2015-07-18 DIAGNOSIS — K430 Incisional hernia with obstruction, without gangrene: Secondary | ICD-10-CM | POA: Diagnosis present

## 2015-07-18 DIAGNOSIS — M1990 Unspecified osteoarthritis, unspecified site: Secondary | ICD-10-CM | POA: Diagnosis present

## 2015-07-18 DIAGNOSIS — Z79891 Long term (current) use of opiate analgesic: Secondary | ICD-10-CM | POA: Diagnosis not present

## 2015-07-18 DIAGNOSIS — Z79899 Other long term (current) drug therapy: Secondary | ICD-10-CM | POA: Diagnosis not present

## 2015-07-18 DIAGNOSIS — Z8249 Family history of ischemic heart disease and other diseases of the circulatory system: Secondary | ICD-10-CM

## 2015-07-18 DIAGNOSIS — Z833 Family history of diabetes mellitus: Secondary | ICD-10-CM | POA: Diagnosis not present

## 2015-07-18 DIAGNOSIS — K660 Peritoneal adhesions (postprocedural) (postinfection): Secondary | ICD-10-CM | POA: Diagnosis present

## 2015-07-18 DIAGNOSIS — M549 Dorsalgia, unspecified: Secondary | ICD-10-CM | POA: Diagnosis present

## 2015-07-18 DIAGNOSIS — Z90710 Acquired absence of both cervix and uterus: Secondary | ICD-10-CM | POA: Diagnosis not present

## 2015-07-18 DIAGNOSIS — I10 Essential (primary) hypertension: Secondary | ICD-10-CM | POA: Diagnosis present

## 2015-07-18 DIAGNOSIS — Z96652 Presence of left artificial knee joint: Secondary | ICD-10-CM | POA: Diagnosis present

## 2015-07-18 DIAGNOSIS — K219 Gastro-esophageal reflux disease without esophagitis: Secondary | ICD-10-CM | POA: Diagnosis present

## 2015-07-18 DIAGNOSIS — M797 Fibromyalgia: Secondary | ICD-10-CM | POA: Diagnosis present

## 2015-07-18 HISTORY — PX: VENTRAL HERNIA REPAIR: SHX424

## 2015-07-18 HISTORY — PX: INSERTION OF MESH: SHX5868

## 2015-07-18 SURGERY — REPAIR, HERNIA, VENTRAL, LAPAROSCOPIC
Anesthesia: General

## 2015-07-18 MED ORDER — MIDAZOLAM HCL 5 MG/5ML IJ SOLN
INTRAMUSCULAR | Status: DC | PRN
  Administered 2015-07-18: 1 mg via INTRAVENOUS

## 2015-07-18 MED ORDER — PROPOFOL 10 MG/ML IV BOLUS
INTRAVENOUS | Status: DC | PRN
  Administered 2015-07-18: 190 mg via INTRAVENOUS

## 2015-07-18 MED ORDER — EPHEDRINE SULFATE 50 MG/ML IJ SOLN
INTRAMUSCULAR | Status: DC | PRN
  Administered 2015-07-18 (×4): 5 mg via INTRAVENOUS

## 2015-07-18 MED ORDER — ATENOLOL 12.5 MG HALF TABLET
12.5000 mg | ORAL_TABLET | Freq: Every morning | ORAL | Status: DC
  Administered 2015-07-19 – 2015-07-23 (×5): 12.5 mg via ORAL
  Filled 2015-07-18 (×6): qty 1

## 2015-07-18 MED ORDER — PROPOFOL 10 MG/ML IV BOLUS
INTRAVENOUS | Status: AC
  Filled 2015-07-18: qty 20

## 2015-07-18 MED ORDER — ONDANSETRON HCL 4 MG/2ML IJ SOLN
INTRAMUSCULAR | Status: AC
  Filled 2015-07-18: qty 2

## 2015-07-18 MED ORDER — FENTANYL CITRATE (PF) 100 MCG/2ML IJ SOLN
INTRAMUSCULAR | Status: AC
  Filled 2015-07-18: qty 4

## 2015-07-18 MED ORDER — LIDOCAINE HCL (CARDIAC) 20 MG/ML IV SOLN
INTRAVENOUS | Status: AC
  Filled 2015-07-18: qty 5

## 2015-07-18 MED ORDER — NEOSTIGMINE METHYLSULFATE 10 MG/10ML IV SOLN
INTRAVENOUS | Status: DC | PRN
  Administered 2015-07-18: 5 mg via INTRAVENOUS

## 2015-07-18 MED ORDER — FENTANYL CITRATE (PF) 100 MCG/2ML IJ SOLN
25.0000 ug | INTRAMUSCULAR | Status: DC | PRN
  Administered 2015-07-18 (×2): 25 ug via INTRAVENOUS

## 2015-07-18 MED ORDER — 0.9 % SODIUM CHLORIDE (POUR BTL) OPTIME
TOPICAL | Status: DC | PRN
  Administered 2015-07-18: 1000 mL

## 2015-07-18 MED ORDER — GLYCOPYRROLATE 0.2 MG/ML IJ SOLN
INTRAMUSCULAR | Status: AC
  Filled 2015-07-18: qty 4

## 2015-07-18 MED ORDER — FENTANYL CITRATE (PF) 100 MCG/2ML IJ SOLN
INTRAMUSCULAR | Status: AC
  Filled 2015-07-18: qty 2

## 2015-07-18 MED ORDER — POTASSIUM CHLORIDE IN NACL 20-0.45 MEQ/L-% IV SOLN
INTRAVENOUS | Status: DC
  Administered 2015-07-18 – 2015-07-19 (×2): via INTRAVENOUS
  Filled 2015-07-18 (×3): qty 1000

## 2015-07-18 MED ORDER — HEPARIN SODIUM (PORCINE) 5000 UNIT/ML IJ SOLN
5000.0000 [IU] | Freq: Three times a day (TID) | INTRAMUSCULAR | Status: DC
  Administered 2015-07-19 – 2015-07-23 (×13): 5000 [IU] via SUBCUTANEOUS
  Filled 2015-07-18 (×18): qty 1

## 2015-07-18 MED ORDER — FENTANYL CITRATE (PF) 250 MCG/5ML IJ SOLN
INTRAMUSCULAR | Status: AC
Start: 2015-07-18 — End: 2015-07-18
  Filled 2015-07-18: qty 25

## 2015-07-18 MED ORDER — ROCURONIUM BROMIDE 100 MG/10ML IV SOLN
INTRAVENOUS | Status: DC | PRN
  Administered 2015-07-18: 30 mg via INTRAVENOUS
  Administered 2015-07-18 (×5): 10 mg via INTRAVENOUS

## 2015-07-18 MED ORDER — PANTOPRAZOLE SODIUM 40 MG IV SOLR
40.0000 mg | Freq: Every day | INTRAVENOUS | Status: DC
Start: 2015-07-18 — End: 2015-07-22
  Administered 2015-07-18 – 2015-07-21 (×4): 40 mg via INTRAVENOUS
  Filled 2015-07-18 (×5): qty 40

## 2015-07-18 MED ORDER — ROCURONIUM BROMIDE 100 MG/10ML IV SOLN
INTRAVENOUS | Status: AC
  Filled 2015-07-18: qty 1

## 2015-07-18 MED ORDER — SODIUM CHLORIDE 0.9 % IJ SOLN
INTRAMUSCULAR | Status: AC
Start: 2015-07-18 — End: 2015-07-18
  Filled 2015-07-18: qty 10

## 2015-07-18 MED ORDER — FENTANYL CITRATE (PF) 100 MCG/2ML IJ SOLN
INTRAMUSCULAR | Status: DC | PRN
  Administered 2015-07-18 (×3): 50 ug via INTRAVENOUS
  Administered 2015-07-18: 100 ug via INTRAVENOUS
  Administered 2015-07-18: 50 ug via INTRAVENOUS

## 2015-07-18 MED ORDER — CEFAZOLIN SODIUM-DEXTROSE 2-3 GM-% IV SOLR
2.0000 g | INTRAVENOUS | Status: AC
  Administered 2015-07-18: 2 g via INTRAVENOUS

## 2015-07-18 MED ORDER — HYDRALAZINE HCL 20 MG/ML IJ SOLN: 10.0000 mg | INTRAMUSCULAR | Status: DC | PRN

## 2015-07-18 MED ORDER — SODIUM CHLORIDE 0.9 % IV BOLUS (SEPSIS): 500.0000 mL | Freq: Once | INTRAVENOUS | Status: AC

## 2015-07-18 MED ORDER — CHLORHEXIDINE GLUCONATE 4 % EX LIQD: 1.0000 "application " | Freq: Once | CUTANEOUS | Status: DC

## 2015-07-18 MED ORDER — MEPERIDINE HCL 50 MG/ML IJ SOLN: 6.2500 mg | INTRAMUSCULAR | Status: DC | PRN

## 2015-07-18 MED ORDER — PROMETHAZINE HCL 25 MG/ML IJ SOLN: 6.2500 mg | INTRAMUSCULAR | Status: DC | PRN

## 2015-07-18 MED ORDER — HYDROCODONE-ACETAMINOPHEN 5-325 MG PO TABS
1.0000 | ORAL_TABLET | ORAL | Status: DC | PRN
  Administered 2015-07-19 (×2): 1 via ORAL
  Administered 2015-07-20: 2 via ORAL
  Administered 2015-07-20 – 2015-07-22 (×7): 1 via ORAL
  Administered 2015-07-23: 2 via ORAL
  Administered 2015-07-23: 1 via ORAL
  Filled 2015-07-18 (×9): qty 1
  Filled 2015-07-18: qty 2
  Filled 2015-07-18 (×3): qty 1

## 2015-07-18 MED ORDER — SUCCINYLCHOLINE CHLORIDE 20 MG/ML IJ SOLN
INTRAMUSCULAR | Status: DC | PRN
  Administered 2015-07-18: 100 mg via INTRAVENOUS

## 2015-07-18 MED ORDER — BUPIVACAINE HCL (PF) 0.25 % IJ SOLN
INTRAMUSCULAR | Status: AC
  Filled 2015-07-18: qty 30

## 2015-07-18 MED ORDER — EPHEDRINE SULFATE 50 MG/ML IJ SOLN
INTRAMUSCULAR | Status: AC
  Filled 2015-07-18: qty 1

## 2015-07-18 MED ORDER — MORPHINE SULFATE (PF) 2 MG/ML IV SOLN
1.0000 mg | INTRAVENOUS | Status: DC | PRN
  Administered 2015-07-18: 1 mg via INTRAVENOUS
  Administered 2015-07-19 (×2): 2 mg via INTRAVENOUS
  Administered 2015-07-19: 1 mg via INTRAVENOUS
  Administered 2015-07-20: 2 mg via INTRAVENOUS
  Filled 2015-07-18 (×4): qty 1

## 2015-07-18 MED ORDER — MIDAZOLAM HCL 2 MG/2ML IJ SOLN
INTRAMUSCULAR | Status: AC
  Filled 2015-07-18: qty 4

## 2015-07-18 MED ORDER — LIDOCAINE HCL (CARDIAC) 20 MG/ML IV SOLN
INTRAVENOUS | Status: DC | PRN
  Administered 2015-07-18: 100 mg via INTRAVENOUS

## 2015-07-18 MED ORDER — SODIUM CHLORIDE 0.9 % IJ SOLN
INTRAMUSCULAR | Status: AC
  Filled 2015-07-18: qty 10

## 2015-07-18 MED ORDER — LACTATED RINGERS IV SOLN
INTRAVENOUS | Status: DC
  Administered 2015-07-18: 1000 mL via INTRAVENOUS

## 2015-07-18 MED ORDER — FENTANYL CITRATE (PF) 250 MCG/5ML IJ SOLN
INTRAMUSCULAR | Status: AC
  Filled 2015-07-18: qty 25

## 2015-07-18 MED ORDER — PROMETHAZINE HCL 25 MG/ML IJ SOLN: 12.5000 mg | Freq: Three times a day (TID) | INTRAMUSCULAR | Status: DC | PRN

## 2015-07-18 MED ORDER — ROCURONIUM BROMIDE 100 MG/10ML IV SOLN
INTRAVENOUS | Status: AC
Start: 2015-07-18 — End: 2015-07-18
  Filled 2015-07-18: qty 1

## 2015-07-18 MED ORDER — LACTATED RINGERS IR SOLN
Status: DC | PRN
  Administered 2015-07-18: 1

## 2015-07-18 MED ORDER — ACETAMINOPHEN 10 MG/ML IV SOLN
1000.0000 mg | Freq: Four times a day (QID) | INTRAVENOUS | Status: AC
  Administered 2015-07-18 – 2015-07-19 (×4): 1000 mg via INTRAVENOUS
  Filled 2015-07-18 (×6): qty 100

## 2015-07-18 MED ORDER — SODIUM CHLORIDE 0.9 % IV SOLN
INTRAVENOUS | Status: DC
  Administered 2015-07-18: 21:00:00 via INTRAVENOUS

## 2015-07-18 MED ORDER — EPINEPHRINE HCL 1 MG/ML IJ SOLN
INTRAMUSCULAR | Status: AC
  Filled 2015-07-18: qty 1

## 2015-07-18 MED ORDER — GLYCOPYRROLATE 0.2 MG/ML IJ SOLN
INTRAMUSCULAR | Status: AC
  Filled 2015-07-18: qty 1

## 2015-07-18 MED ORDER — ENOXAPARIN SODIUM 40 MG/0.4ML ~~LOC~~ SOLN
40.0000 mg | Freq: Once | SUBCUTANEOUS | Status: AC
  Administered 2015-07-18: 40 mg via SUBCUTANEOUS
  Filled 2015-07-18: qty 0.4

## 2015-07-18 MED ORDER — DEXAMETHASONE SODIUM PHOSPHATE 10 MG/ML IJ SOLN
INTRAMUSCULAR | Status: AC
  Filled 2015-07-18: qty 1

## 2015-07-18 MED ORDER — GLYCOPYRROLATE 0.2 MG/ML IJ SOLN
INTRAMUSCULAR | Status: DC | PRN
  Administered 2015-07-18: .8 mg via INTRAVENOUS
  Administered 2015-07-18: 0.2 mg via INTRAVENOUS

## 2015-07-18 MED ORDER — CEFAZOLIN SODIUM-DEXTROSE 2-3 GM-% IV SOLR
INTRAVENOUS | Status: AC
  Filled 2015-07-18: qty 50

## 2015-07-18 MED ORDER — DEXAMETHASONE SODIUM PHOSPHATE 10 MG/ML IJ SOLN
INTRAMUSCULAR | Status: DC | PRN
  Administered 2015-07-18: 10 mg via INTRAVENOUS

## 2015-07-18 MED ORDER — BUPIVACAINE-EPINEPHRINE (PF) 0.25% -1:200000 IJ SOLN
INTRAMUSCULAR | Status: AC
  Filled 2015-07-18: qty 30

## 2015-07-18 SURGICAL SUPPLY — 56 items
APL SKNCLS STERI-STRIP NONHPOA (GAUZE/BANDAGES/DRESSINGS) ×1
BENZOIN TINCTURE PRP APPL 2/3 (GAUZE/BANDAGES/DRESSINGS) ×2 IMPLANT
BINDER ABDOMINAL 12 ML 46-62 (SOFTGOODS) ×4 IMPLANT
BLADE 10 SAFETY STRL DISP (BLADE) ×2 IMPLANT
COVER SURGICAL LIGHT HANDLE (MISCELLANEOUS) ×2 IMPLANT
DECANTER SPIKE VIAL GLASS SM (MISCELLANEOUS) ×2 IMPLANT
DEVICE SECURE STRAP 25 ABSORB (INSTRUMENTS) ×4 IMPLANT
DEVICE TROCAR PUNCTURE CLOSURE (ENDOMECHANICALS) ×2 IMPLANT
DISSECTOR BLUNT TIP ENDO 5MM (MISCELLANEOUS) IMPLANT
DRAIN CHANNEL 19F RND (DRAIN) ×2 IMPLANT
DRAIN CHANNEL RND F F (WOUND CARE) IMPLANT
DRAPE INCISE IOBAN 66X45 STRL (DRAPES) ×2 IMPLANT
DRAPE LAPAROSCOPIC ABDOMINAL (DRAPES) ×2 IMPLANT
ELECT PENCIL ROCKER SW 15FT (MISCELLANEOUS) ×2 IMPLANT
ELECT REM PT RETURN 9FT ADLT (ELECTROSURGICAL) ×2
ELECTRODE REM PT RTRN 9FT ADLT (ELECTROSURGICAL) ×1 IMPLANT
EVACUATOR SILICONE 100CC (DRAIN) IMPLANT
GAUZE SPONGE 4X4 12PLY STRL (GAUZE/BANDAGES/DRESSINGS) ×2 IMPLANT
GLOVE BIOGEL PI IND STRL 7.0 (GLOVE) ×1 IMPLANT
GLOVE BIOGEL PI INDICATOR 7.0 (GLOVE) ×1
GLOVE SURG SIGNA 7.5 PF LTX (GLOVE) ×2 IMPLANT
GOWN SPEC L4 XLG W/TWL (GOWN DISPOSABLE) ×2 IMPLANT
GOWN STRL REUS W/ TWL XL LVL3 (GOWN DISPOSABLE) ×3 IMPLANT
GOWN STRL REUS W/TWL LRG LVL3 (GOWN DISPOSABLE) ×2 IMPLANT
GOWN STRL REUS W/TWL XL LVL3 (GOWN DISPOSABLE) ×6
KIT BASIN OR (CUSTOM PROCEDURE TRAY) ×2 IMPLANT
LIQUID BAND (GAUZE/BANDAGES/DRESSINGS) ×2 IMPLANT
MESH PARIETEX 25X20 (Mesh General) ×2 IMPLANT
NEEDLE INSUFFLATION 14GA 150MM (NEEDLE) IMPLANT
NEEDLE SPNL 22GX3.5 QUINCKE BK (NEEDLE) ×4 IMPLANT
NS IRRIG 1000ML POUR BTL (IV SOLUTION) ×2 IMPLANT
PEN SKIN MARKING BROAD (MISCELLANEOUS) ×4 IMPLANT
SCISSORS LAP 5X35 DISP (ENDOMECHANICALS) ×2 IMPLANT
SET IRRIG TUBING LAPAROSCOPIC (IRRIGATION / IRRIGATOR) IMPLANT
SHEARS HARMONIC ACE PLUS 36CM (ENDOMECHANICALS) ×2 IMPLANT
SLEEVE ADV FIXATION 5X100MM (TROCAR) ×4 IMPLANT
SLEEVE SURGEON STRL (DRAPES) ×4 IMPLANT
SOLUTION ANTI FOG 6CC (MISCELLANEOUS) ×2 IMPLANT
STAPLER VISISTAT 35W (STAPLE) IMPLANT
STRIP CLOSURE SKIN 1/4X4 (GAUZE/BANDAGES/DRESSINGS) ×2 IMPLANT
SUT ETHILON 2 0 PS N (SUTURE) ×4 IMPLANT
SUT NOVA 0 T19/GS 22DT (SUTURE) ×4 IMPLANT
SUT NOVA 1 T20/GS 25DT (SUTURE) ×2 IMPLANT
SUT NOVA NAB DX-16 0-1 5-0 T12 (SUTURE) ×2 IMPLANT
SUT VIC AB 2-0 SH 18 (SUTURE) ×2 IMPLANT
SUT VIC AB 5-0 PS2 18 (SUTURE) ×4 IMPLANT
TACKER 5MM HERNIA 3.5CML NAB (ENDOMECHANICALS) IMPLANT
TAPE CLOTH SURG 4X10 WHT LF (GAUZE/BANDAGES/DRESSINGS) ×2 IMPLANT
TOWEL OR 17X26 10 PK STRL BLUE (TOWEL DISPOSABLE) ×2 IMPLANT
TRAY FOLEY W/METER SILVER 14FR (SET/KITS/TRAYS/PACK) ×2 IMPLANT
TRAY FOLEY W/METER SILVER 16FR (SET/KITS/TRAYS/PACK) IMPLANT
TRAY LAPAROSCOPIC (CUSTOM PROCEDURE TRAY) ×2 IMPLANT
TROCAR ADV FIXATION 5X100MM (TROCAR) ×2 IMPLANT
TROCAR BLADELESS OPT 5 75 (ENDOMECHANICALS) ×6 IMPLANT
TROCAR XCEL NON-BLD 11X100MML (ENDOMECHANICALS) ×2 IMPLANT
TUBING INSUFFLATION 10FT LAP (TUBING) ×2 IMPLANT

## 2015-07-18 NOTE — Progress Notes (Signed)
Pt returned to short stay room 8, surgery delayed,patient and husband aware

## 2015-07-18 NOTE — Anesthesia Postprocedure Evaluation (Signed)
  Anesthesia Post-op Note  Patient: Phyllis Knight  Procedure(s) Performed: Procedure(s) (LRB): LAPAROSCOPIC VENTRAL AND UMBILICAL  HERNIA LYSIS OF ADHESIONS (N/A) INSERTION OF MESH (N/A)  Patient Location: PACU  Anesthesia Type: General  Level of Consciousness: awake and alert   Airway and Oxygen Therapy: Patient Spontanous Breathing  Post-op Pain: mild  Post-op Assessment: Post-op Vital signs reviewed, Patient's Cardiovascular Status Stable, Respiratory Function Stable, Patent Airway and No signs of Nausea or vomiting  Last Vitals:  Filed Vitals:   07/18/15 2045  BP: 151/75  Pulse: 78  Temp:   Resp: 16    Post-op Vital Signs: stable   Complications: No apparent anesthesia complications

## 2015-07-18 NOTE — Progress Notes (Signed)
Patient and family have been kept informed of surgery delays today, has been ok with this, but now called to room and a little upset.  Called OR again, and they will continue to keep [place] informed, they wish to remain here and still have surgery.  She will call when she needs [place],

## 2015-07-18 NOTE — Anesthesia Preprocedure Evaluation (Addendum)
Anesthesia Evaluation  Patient identified by MRN, date of birth, ID band Patient awake    Reviewed: Allergy & Precautions, H&P , NPO status , Patient's Chart, lab work & pertinent test results  Airway Mallampati: II  TM Distance: >3 FB Neck ROM: full    Dental no notable dental hx. (+) Teeth Intact, Dental Advisory Given   Pulmonary neg pulmonary ROS,  breath sounds clear to auscultation  Pulmonary exam normal       Cardiovascular Exercise Tolerance: Good hypertension, Pt. on home beta blockers and Pt. on medications Normal cardiovascular examRhythm:regular Rate:Normal     Neuro/Psych Cervical fusion negative neurological ROS  negative psych ROS   GI/Hepatic negative GI ROS, Neg liver ROS, GERD-  Medicated and Controlled,  Endo/Other  negative endocrine ROS  Renal/GU negative Renal ROS  negative genitourinary   Musculoskeletal   Abdominal   Peds  Hematology negative hematology ROS (+)   Anesthesia Other Findings   Reproductive/Obstetrics negative OB ROS                           Anesthesia Physical  Anesthesia Plan  ASA: II  Anesthesia Plan: General   Post-op Pain Management:    Induction: Intravenous  Airway Management Planned: Oral ETT  Additional Equipment:   Intra-op Plan:   Post-operative Plan: Extubation in OR  Informed Consent: I have reviewed the patients History and Physical, chart, labs and discussed the procedure including the risks, benefits and alternatives for the proposed anesthesia with the patient or authorized representative who has indicated his/her understanding and acceptance.   Dental Advisory Given  Plan Discussed with: CRNA and Surgeon  Anesthesia Plan Comments:         Anesthesia Quick Evaluation

## 2015-07-18 NOTE — Anesthesia Procedure Notes (Signed)
Procedure Name: Intubation Date/Time: 07/18/2015 4:25 PM Performed by: Jarvis Newcomer A Pre-anesthesia Checklist: Patient identified, Emergency Drugs available, Suction available, Patient being monitored and Timeout performed Patient Re-evaluated:Patient Re-evaluated prior to inductionOxygen Delivery Method: Circle system utilized Preoxygenation: Pre-oxygenation with 100% oxygen Intubation Type: IV induction Ventilation: Mask ventilation without difficulty Laryngoscope Size: Mac and 4 Grade View: Grade II Tube type: Oral Tube size: 7.5 mm Number of attempts: 2 Airway Equipment and Method: Stylet Placement Confirmation: ETT inserted through vocal cords under direct vision,  positive ETCO2 and breath sounds checked- equal and bilateral Secured at: 21 cm Tube secured with: Tape Dental Injury: Teeth and Oropharynx as per pre-operative assessment

## 2015-07-18 NOTE — Transfer of Care (Signed)
Immediate Anesthesia Transfer of Care Note  Patient: Phyllis Knight  Procedure(s) Performed: Procedure(s): LAPAROSCOPIC VENTRAL AND UMBILICAL  HERNIA LYSIS OF ADHESIONS (N/A) INSERTION OF MESH (N/A)  Patient Location: PACU  Anesthesia Type:General  Level of Consciousness: awake, alert , oriented and patient cooperative  Airway & Oxygen Therapy: Patient Spontanous Breathing and Patient connected to face mask oxygen  Post-op Assessment: Report given to RN, Post -op Vital signs reviewed and stable and Patient moving all extremities  Post vital signs: Reviewed and stable  Last Vitals:  Filed Vitals:   07/18/15 0541  BP: 168/93  Pulse: 60  Temp: 37 C  Resp: 16    Complications: No apparent anesthesia complications

## 2015-07-19 ENCOUNTER — Encounter (HOSPITAL_COMMUNITY): Payer: Self-pay | Admitting: Surgery

## 2015-07-19 LAB — BASIC METABOLIC PANEL
Anion gap: 8 (ref 5–15)
BUN: 8 mg/dL (ref 6–20)
CO2: 25 mmol/L (ref 22–32)
Calcium: 8.7 mg/dL — ABNORMAL LOW (ref 8.9–10.3)
Chloride: 104 mmol/L (ref 101–111)
Creatinine, Ser: 0.46 mg/dL (ref 0.44–1.00)
GFR calc Af Amer: >60 mL/min (ref >60)
GFR calc non Af Amer: >60 mL/min (ref >60)
Glucose, Bld: 164 mg/dL — ABNORMAL HIGH (ref 65–99)
Potassium: 4.4 mmol/L (ref 3.5–5.1)
Sodium: 137 mmol/L (ref 135–145)

## 2015-07-19 LAB — CBC
HCT: 36.9 % (ref 36.0–46.0)
Hemoglobin: 12.5 g/dL (ref 12.0–15.0)
MCH: 31.3 pg (ref 26.0–34.0)
MCHC: 33.9 g/dL (ref 30.0–36.0)
MCV: 92.5 fL (ref 78.0–100.0)
Platelets: 194 10*3/uL (ref 150–400)
RBC: 3.99 MIL/uL (ref 3.87–5.11)
RDW: 12.3 % (ref 11.5–15.5)
WBC: 11 10*3/uL — ABNORMAL HIGH (ref 4.0–10.5)

## 2015-07-19 MED ORDER — CETYLPYRIDINIUM CHLORIDE 0.05 % MT LIQD
7.0000 mL | Freq: Two times a day (BID) | OROMUCOSAL | Status: DC
  Administered 2015-07-19 – 2015-07-22 (×7): 7 mL via OROMUCOSAL

## 2015-07-19 MED ORDER — POTASSIUM CHLORIDE IN NACL 20-0.45 MEQ/L-% IV SOLN
INTRAVENOUS | Status: DC
  Administered 2015-07-19: 1 mL via INTRAVENOUS
  Administered 2015-07-19: 1000 mL via INTRAVENOUS
  Administered 2015-07-21 – 2015-07-23 (×5): via INTRAVENOUS
  Filled 2015-07-19 (×10): qty 1000

## 2015-07-19 NOTE — Op Note (Signed)
NAME:  Phyllis Knight, Phyllis Knight              ACCOUNT NO.:  [id number]  MEDICAL RECORD NO.:  [id number]  LOCATION:  1232                         FACILITY:  [facility]  PHYSICIAN:  [name]. [name], M.D.  DATE OF BIRTH:  1941/03/22  DATE OF PROCEDURE:  07/18/2015 DATE OF DISCHARGE:                              OPERATIVE REPORT   PREOPERATIVE DIAGNOSIS:  Incisional hernia of lower abdomen with umbilical hernia.  POSTOPERATIVE DIAGNOSIS:  Incarcerated incisional hernia with umbilical hernia.  OPERATION PERFORMED:  Laparoscopic enterolysis of adhesions for 90 minutes with combined open and intraabdominal repair of ventral hernia and umbilical hernia with 20 x 25 cm Parietex mesh.  [photos in chart]  SURGEON:  [name]. [name], M.D.  FIRST ASSISTANT:  None.  ANESTHESIA:  General endotracheal.  ESTIMATED BLOOD LOSS:  150 mL.  DRAINS:  Left in was a 19-Blake drain.  INDICATION FOR PROCEDURE:  [name] is a 74 year old African American female who is a patient of [name], who had a history of a prior hysterectomy remotely.  She has an abdominal wall hernia, which has become increasingly symptomatic and excellent repair of this hernia.  The patient was originally scheduled at 7:30 on the morning of the 16th; however, she was delayed because of emergency surgery.  The case was now started to about 4:30 on the Tuesday, July 18, 2015.  OPERATIVE NOTE: She was placed in room #6, both her arms were tucked.  A Foley catheter was placed.  She was given 2 g of Ancef at the initiation of procedure.  Her abdomen was prepped with ChloraPrep and sterilely draped.  I used an Ioban drape.    Time-out was held and the surgical checklist run.  I accessed her abdominal cavity through the left upper quadrant and placed a 5-mm trocar.  I placed a total of four 5-mm trocars, one in the right lateral abdomen, one in the right upper quadrant, one in the left upper quadrant and left abdomen.  The abdominal  exploration revealed the upper abdomen was fairly clear.  Her liver was unremarkable.  The stomach was unremarkable.  I could see the NG tube through the wall of the stomach.  Her gallbladder was absent; however, she had adhesions in the bottom two-thirds of peritoneal cavity, which were fairly extensive though, not very dense.  I spent about 90 minutes to 100 minutes doing enterolysis of adhesions of the lower abdomen.  I did not dissect the adhesions out of the pelvis.  She had small bowel incarcerated in the lower hernia, which I reduced.  She had two fascial defects (hernias:  one about a 4 x 6 cm defect in the lower midline (the sac from this hernia dissected to the left lower abdomen) and the second was a 3-cm defect at the umbilicus.  Because there was a large sac associated with this ventral incisional hernia which was probably about 15 cm in diameter, I made an incision in the lower abdomen and excised the sac.  I then did a primary repair of the lower midline hernia with interrupted #1 Novafil sutures.  This made an airtight closure.  The defect was about 4 x 6 cm and this  closed fairly well.  I introduced into the peritoneal cavity, prior to closure, piece of 20 x 25 cm Parietex mesh that I was thought would cover both the ventral abdominal wall hernia defect and the umbilical hernia defect.  I put 10 holding sutures with bites of the holding sutures in the lower part of the belly.  After the abdomen was closed, I then re-insufflated the abdomen and grabbed these 10 holding sutures through stab wounds.  She has a pannus, which made accessing these in a uniform methods, somewhat difficult, but it did lay out very well to cover both the primary hernia defect and the umbilical hernia.  After the 10 sutures were pulled to the anterior abdominal wall and these sutures were tied.  I then used the SecureStrap.  I used a total of 50 tacks to tack the mesh along the edges and the middle of it, and I now put  two additional short-stature through-and-through 0 Novafil sutures as kind of holding sutures in the mesh and the mesh lay flat.  I took photos of the mesh and put this in the chart.  The patient tolerated the procedure well, though the extensive enterolysis added to the length of the operation.  I placed a 19-French Harrison Mons in the lower abdomen incision.  I then closed the lower abdomen incision in layers with 2-0 Vicryl sutures deep and then 2-0 nylons and staples in the skin.  I removed each port under direct visualization and closed these sites with 5-0 Vicryl suture.  I placed Dermabond on these, the 12 stab wounds.  So, I used one stab wound for the drain, which was sewn in place with 2-0 nylon suture.  The patient tolerated the procedure well.    Because of the delay in her surgery into the evening and the length of surgery, I will put her in Step-Down overnight.  Sponge and needle counts were correct at the end of the case.   [name]. [name], M.D., [facility], Epic scribe    DHN/MEDQ  D:  07/18/2015  T:  07/19/2015  Job:  [id number]  cc:   Dario Guardian, M.D. Fax: [phone]

## 2015-07-19 NOTE — Progress Notes (Signed)
General Surgery Note  LOS: 1 day  POD -  1 Day Post-Op  Assessment/Plan: 1.  LAPAROSCOPIC VENTRAL AND UMBILICAL  HERNIA, LYSIS OF ADHESIONS (90 minutes), INSERTION OF MESH - 07/18/2015 - D. Ruven Corradi  Placed in step down overnight because of late surgery.  Will move to floor.  To start clear liquids and needs to ambulate.  2. Arthritis  Left knee replacement - Sept 2014 - Oliin  Still has swelling in the knee  3. Fibromyalgia  4. GERD  5. HTN x 30 years  6. There is a note about uterine cancer in the chart, but the patient denies this  She had a hysterectomy in 1972 for bleeding  7. Diverticulosis  9. History of trach for pneumonia in 1968. 10.  DVT prophylaxis - SQ heparin 11.  Foley - to remove  Active Problems:   Incarcerated ventral hernia  Subjective:  Sore, but doing well.  Minimal nausea.  No family in room. Objective:   Filed Vitals:   07/19/15 0700  BP: 129/59  Pulse: 63  Temp:   Resp: 15     Intake/Output from previous day:  08/16 0701 - 08/17 0700 In: 3454.2 [I.V.:3254.2; IV Piggyback:200] Out: 1525 [Urine:1450; Blood:75]  Intake/Output this shift:      Physical Exam:   General: Obese older AA F who is alert and oriented.    HEENT: Normal. Pupils equal. .   Lungs: Clear   Abdomen: Soft   Wound: Dressing in place. Drain - 0 cc recorded   Lab Results:    Recent Labs  07/19/15 0336  WBC 11.0*  HGB 12.5  HCT 36.9  PLT 194    BMET   Recent Labs  07/19/15 0336  NA 137  K 4.4  CL 104  CO2 25  GLUCOSE 164*  BUN 8  CREATININE 0.46  CALCIUM 8.7*    PT/INR  No results for input(s): LABPROT, INR in the last 72 hours.  ABG  No results for input(s): PHART, HCO3 in the last 72 hours.  Invalid input(s): PCO2, PO2   Studies/Results:  No results found.   Anti-infectives:   Anti-infectives    Start     Dose/Rate Route Frequency Ordered Stop   07/18/15 0600  ceFAZolin (ANCEF) IVPB 2 g/50 mL premix      2 g 100 mL/hr over 30 Minutes Intravenous On call to O.R. 07/18/15 0546 07/18/15 1626      Ovidio Kin, MD, FACS Pager: [phone] [facility] Office: [phone] 07/19/2015

## 2015-07-20 LAB — CBC WITH DIFFERENTIAL/PLATELET
Basophils Absolute: 0 10*3/uL (ref 0.0–0.1)
Basophils Relative: 0 % (ref 0–1)
Eosinophils Absolute: 0 10*3/uL (ref 0.0–0.7)
Eosinophils Relative: 0 % (ref 0–5)
HCT: 33.6 % — ABNORMAL LOW (ref 36.0–46.0)
Hemoglobin: 11 g/dL — ABNORMAL LOW (ref 12.0–15.0)
Lymphocytes Relative: 15 % (ref 12–46)
Lymphs Abs: 1.5 10*3/uL (ref 0.7–4.0)
MCH: 31 pg (ref 26.0–34.0)
MCHC: 32.7 g/dL (ref 30.0–36.0)
MCV: 94.6 fL (ref 78.0–100.0)
Monocytes Absolute: 0.6 10*3/uL (ref 0.1–1.0)
Monocytes Relative: 6 % (ref 3–12)
Neutro Abs: 7.8 10*3/uL — ABNORMAL HIGH (ref 1.7–7.7)
Neutrophils Relative %: 79 % — ABNORMAL HIGH (ref 43–77)
Platelets: 204 10*3/uL (ref 150–400)
RBC: 3.55 MIL/uL — ABNORMAL LOW (ref 3.87–5.11)
RDW: 12.6 % (ref 11.5–15.5)
WBC: 9.9 10*3/uL (ref 4.0–10.5)

## 2015-07-20 LAB — BASIC METABOLIC PANEL
Anion gap: 4 — ABNORMAL LOW (ref 5–15)
BUN: 13 mg/dL (ref 6–20)
CO2: 27 mmol/L (ref 22–32)
Calcium: 8.8 mg/dL — ABNORMAL LOW (ref 8.9–10.3)
Chloride: 106 mmol/L (ref 101–111)
Creatinine, Ser: 0.68 mg/dL (ref 0.44–1.00)
GFR calc Af Amer: >60 mL/min (ref >60)
GFR calc non Af Amer: >60 mL/min (ref >60)
Glucose, Bld: 97 mg/dL (ref 65–99)
Potassium: 4.7 mmol/L (ref 3.5–5.1)
Sodium: 137 mmol/L (ref 135–145)

## 2015-07-20 NOTE — Progress Notes (Signed)
General Surgery Note  LOS: 2 days  POD -  2 Days Post-Op  Assessment/Plan: 1.  LAPAROSCOPIC VENTRAL AND UMBILICAL  HERNIA, LYSIS OF ADHESIONS (90 minutes), INSERTION OF MESH - 07/18/2015 - D. [employer] clear liquids.  Will advance to reg diet.  She is walking. 2. Arthritis  Left knee replacement - Sept 2014 - Oliin  Still has swelling in the knee  3. Fibromyalgia  4. GERD  5. HTN x 30 years  6. There is a note about uterine cancer in the chart, but the patient denies this  She had a hysterectomy in 1972 for bleeding  7. Diverticulosis  9. History of trach for pneumonia in 1968. 10.  DVT prophylaxis - SQ heparin   Active Problems:   Incarcerated ventral hernia  Subjective:  Doing well with liquids.  No flatus, but tolerating liquids.  No family in room. Objective:   Filed Vitals:   07/20/15 0524  BP: 124/66  Pulse: 59  Temp: 99.1 F (37.3 C)  Resp: 18     Intake/Output from previous day:  08/17 0701 - 08/18 0700 In: 3210.4 [P.O.:720; I.V.:2490.4] Out: 1900 [Urine:1875; Drains:25]  Intake/Output this shift:  Total I/O In: 395 [I.V.:395] Out: 210 [Urine:200; Drains:10]   Physical Exam:   General: Obese older AA F who is alert and oriented.    HEENT: Normal. Pupils equal. .   Lungs: Clear   Abdomen: Soft     Has few BS.   Wound: Dressing in place. Drain - 25 cc recorded yesterday in drain.   Lab Results:     Recent Labs  07/19/15 0336 07/20/15 0530  WBC 11.0* 9.9  HGB 12.5 11.0*  HCT 36.9 33.6*  PLT 194 204    BMET    Recent Labs  07/19/15 0336 07/20/15 0530  NA 137 137  K 4.4 4.7  CL 104 106  CO2 25 27  GLUCOSE 164* 97  BUN 8 13  CREATININE 0.46 0.68  CALCIUM 8.7* 8.8*    PT/INR  No results for input(s): LABPROT, INR in the last 72 hours.  ABG  No results for input(s): PHART, HCO3 in the last 72 hours.  Invalid input(s): PCO2, PO2   Studies/Results:  No results  found.   Anti-infectives:   Anti-infectives    Start     Dose/Rate Route Frequency Ordered Stop   07/18/15 0600  ceFAZolin (ANCEF) IVPB 2 g/50 mL premix     2 g 100 mL/hr over 30 Minutes Intravenous On call to O.R. 07/18/15 0546 07/18/15 1626      Ovidio Kin, MD, FACS Pager: [phone] [facility] Office: [phone] 07/20/2015

## 2015-07-20 NOTE — Care Management Important Message (Signed)
Important Message  Patient Details  Name: KIMIKA STREATER MRN: [id number] Date of Birth: Apr 26, 1941   Medicare Important Message Given:  Yes-second notification given    Haskell Flirt 07/20/2015, 1:23 PMImportant Message  Patient Details  Name: JASMINA GENDRON MRN: [id number] Date of Birth: 12-09-1940   Medicare Important Message Given:  Yes-second notification given    Haskell Flirt 07/20/2015, 1:23 PM

## 2015-07-20 NOTE — Evaluation (Signed)
Physical Therapy Evaluation Patient Details Name: Phyllis Knight MRN: [id number] DOB: February 03, 1941 Today's Date: 07/20/2015   History of Present Illness  Pt is a 74 year old female s/p LAPAROSCOPIC VENTRAL AND UMBILICAL HERNIA, LYSIS OF ADHESIONS, INSERTION OF MESH - 07/18/2015 with hx of fibromyalgia, L TKA, urinary incontinence  Clinical Impression  Pt admitted with above diagnosis. Pt currently with functional limitations due to the deficits listed below (see PT Problem List).  Pt will benefit from skilled PT to increase their independence and safety with mobility to allow discharge to the venue listed below.  Pt assisted with ambulating in hallway and plans to d/c home with spouse assist.     Follow Up Recommendations Home health PT;Supervision - Intermittent    Equipment Recommendations  None recommended by PT    Recommendations for Other Services       Precautions / Restrictions Precautions Precautions: Fall Precaution Comments: JP drain Required Braces or Orthoses: Other Brace/Splint Other Brace/Splint: abdominal binder Restrictions Weight Bearing Restrictions: No      Mobility  Bed Mobility Overal bed mobility: Needs Assistance Bed Mobility: Supine to Sit     Supine to sit: HOB elevated;Min assist     General bed mobility comments: verbal cues for technique, pt requesting repositioning bed to assist herself  Transfers Overall transfer level: Needs assistance Equipment used: Rolling walker (2 wheeled) Transfers: Sit to/from [employer] Sit to Stand: Min assist Stand pivot transfers: Min assist       General transfer comment: verbal cues for safe technique, urinary incontience upon standing so assist for safety to [facility]  Ambulation/Gait Ambulation/Gait assistance: Min guard Ambulation Distance (Feet): 200 Feet Assistive device: Rolling walker (2 wheeled) Gait Pattern/deviations: Step-through pattern;Trunk flexed;Wide base of support      General Gait Details: verbal cues for safe use of RW and posture  Stairs            Wheelchair Mobility    Modified Rankin (Stroke Patients Only)       Balance                                             Pertinent Vitals/Pain Pain Assessment: 0-10 Pain Score: 7  Pain Location: abdomen Pain Descriptors / Indicators: Sore;Aching;Operative site guarding Pain Intervention(s): Repositioned;Monitored during session;Limited activity within patient's tolerance    Home Living Family/patient expects to be discharged to:: Private residence Living Arrangements: Spouse/significant other   Type of Home: House Home Access: Stairs to enter Entrance Stairs-Rails: None Entrance Stairs-Number of Steps: 2 Home Layout: One level Home Equipment: Environmental consultant - 2 wheels;Cane - single point      Prior Function Level of Independence: Independent with assistive device(s)         Comments: states she typically uses SPC     Hand Dominance        Extremity/Trunk Assessment   Upper Extremity Assessment: Generalized weakness           Lower Extremity Assessment: Generalized weakness         Communication   Communication: No difficulties  Cognition Arousal/Alertness: Awake/alert Behavior During Therapy: WFL for tasks assessed/performed Overall Cognitive Status: Within Functional Limits for tasks assessed                      General Comments      Exercises  Assessment/Plan    PT Assessment Patient needs continued PT services  PT Diagnosis Difficulty walking;Generalized weakness   PT Problem List Decreased strength;Decreased activity tolerance;Decreased mobility;Decreased knowledge of use of DME;Pain  PT Treatment Interventions DME instruction;Gait training;Functional mobility training;Patient/family education;Therapeutic activities;Therapeutic exercise;Stair training   PT Goals (Current goals can be found in the Care Plan section)  Acute Rehab PT Goals PT Goal Formulation: With patient Time For Goal Achievement: 07/27/15 Potential to Achieve Goals: Good    Frequency Min 3X/week   Barriers to discharge        Co-evaluation               End of Session Equipment Utilized During Treatment: Gait belt Activity Tolerance: Patient tolerated treatment well Patient left: in bed;with call bell/phone within reach;with bed alarm set           Time: 1610-9604 PT Time Calculation (min) (ACUTE ONLY): 36 min   Charges:   PT Evaluation $Initial PT Evaluation Tier I: 1 Procedure PT Treatments $Gait Training: 8-22 mins   PT G Codes:        Phyllis Knight,[name] 07/20/2015, 12:59 PM Zenovia Jarred, PT, DPT 07/20/2015 Pager: [phone]

## 2015-07-21 NOTE — Progress Notes (Signed)
General Surgery Note  LOS: 3 days  POD -  3 Days Post-Op  Assessment/Plan: 1.  LAPAROSCOPIC VENTRAL AND UMBILICAL  HERNIA, LYSIS OF ADHESIONS (90 minutes), INSERTION OF MESH - 07/18/2015 - D. Chardae Mulkern  Going slow with diet  She is walking.  I removed her LLQ drain.  It has not put much out. 2. Arthritis  Left knee replacement - Sept 2014 - Oliin  Still has swelling in the knee  3. Fibromyalgia  4. GERD  5. HTN x 30 years  6. There is a note about uterine cancer in the chart, but the patient denies this  She had a hysterectomy in 1972 for bleeding  7. Diverticulosis  9. History of trach for pneumonia in 1968. 10.  DVT prophylaxis - SQ heparin   Active Problems:   Incarcerated ventral hernia  Subjective:  On reg food.  Passed small flatus  No family in room. Objective:   Filed Vitals:   07/21/15 0928  BP: 120/71  Pulse: 64  Temp:   Resp:      Intake/Output from previous day:  08/18 0701 - 08/19 0700 In: 3048.8 [P.O.:1080; I.V.:1968.8] Out: 2070 [Urine:2025; Drains:45]  Intake/Output this shift:  Total I/O In: 540 [P.O.:240; I.V.:300] Out: 350 [Urine:350]   Physical Exam:   General: Obese older AA F who is alert and oriented.    HEENT: Normal. Pupils equal. .   Lungs: Clear.  Pulls 1,000 cc on IS.   Abdomen: Soft     Has few BS.   Wound: Dressing in place. Drain - removed.   Lab Results:     Recent Labs  07/19/15 0336 07/20/15 0530  WBC 11.0* 9.9  HGB 12.5 11.0*  HCT 36.9 33.6*  PLT 194 204    BMET    Recent Labs  07/19/15 0336 07/20/15 0530  NA 137 137  K 4.4 4.7  CL 104 106  CO2 25 27  GLUCOSE 164* 97  BUN 8 13  CREATININE 0.46 0.68  CALCIUM 8.7* 8.8*    PT/INR  No results for input(s): LABPROT, INR in the last 72 hours.  ABG  No results for input(s): PHART, HCO3 in the last 72 hours.  Invalid input(s): PCO2, PO2   Studies/Results:  No results found.   Anti-infectives:   Anti-infectives     Start     Dose/Rate Route Frequency Ordered Stop   07/18/15 0600  ceFAZolin (ANCEF) IVPB 2 g/50 mL premix     2 g 100 mL/hr over 30 Minutes Intravenous On call to O.R. 07/18/15 0546 07/18/15 1626      Ovidio Kin, MD, FACS Pager: [phone] [facility] Office: [phone] 07/21/2015

## 2015-07-22 MED ORDER — PANTOPRAZOLE SODIUM 40 MG PO TBEC
40.0000 mg | DELAYED_RELEASE_TABLET | Freq: Every day | ORAL | Status: DC
  Administered 2015-07-22: 40 mg via ORAL
  Filled 2015-07-22 (×2): qty 1

## 2015-07-22 NOTE — Progress Notes (Signed)
Patient ID: Phyllis Knight, female   DOB: Dec 11, 1940, 74 y.o.   MRN: [id number] [facility] Surgery Progress Note:   4 Days Post-Op  Subjective: Mental status is clear.  No complaints and has started on a regular diet.   Objective: Vital signs in last 24 hours: Temp:  [98.3 F (36.8 C)-98.8 F (37.1 C)] 98.3 F (36.8 C) (08/20 0512) Pulse Rate:  [53-71] 68 (08/20 0512) Resp:  [18] 18 (08/20 0512) BP: (117-162)/(71-82) 162/76 mmHg (08/20 0512) SpO2:  [95 %-98 %] 96 % (08/20 0512)  Intake/Output from previous day: 08/19 0701 - 08/20 0700 In: 2400 [P.O.:600; I.V.:1800] Out: 1450 [Urine:1450] Intake/Output this shift: Total I/O In: -  Out: 250 [Urine:250]  Physical Exam: Work of breathing is normal.  Incisions with minimal pain.  Passing gas this morning  Lab Results:  No results found for this or any previous visit (from the past 48 hour(s)).  Radiology/Results: No results found.  Anti-infectives: Anti-infectives    Start     Dose/Rate Route Frequency Ordered Stop   07/18/15 0600  ceFAZolin (ANCEF) IVPB 2 g/50 mL premix     2 g 100 mL/hr over 30 Minutes Intravenous On call to O.R. 07/18/15 0546 07/18/15 1626      Assessment/Plan: Problem List: Patient Active Problem List   Diagnosis Date Noted  . Incarcerated ventral hernia 07/18/2015  . 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 Expected blood loss anemia 08/04/2013  . Obese 08/04/2013  . S/P left TKA 08/03/2013    If solids tolerated today, will plan discharge tomorrow.   4 Days Post-Op    LOS: 4 days   Matt B. Daphine Deutscher, MD, [facility] [phone] beeper [phone]  07/22/2015 9:04 AM

## 2015-07-22 NOTE — Progress Notes (Signed)
PT Cancellation Note  Patient Details Name: Phyllis Knight MRN: [id number] DOB: Jan 25, 1941   Cancelled Treatment:    Reason Eval/Treat Not Completed: Fatigue/lethargy limiting ability to participate (patient states she walkied recently. will walk later.)will try to return to assist w/ walking as schedule permits.   [name] 07/22/2015, 2:37 PM Blanchard Kelch PT [phone]

## 2015-07-22 NOTE — Progress Notes (Signed)
Key Points: Use following P&T approved IV to PO non-antibiotic change policy.  Description contains the criteria that are approved Note: Policy Excludes:  Esophagectomy patientsPHARMACIST - PHYSICIAN COMMUNICATION DR:   [name] CONCERNING: IV to Oral Route Change Policy  RECOMMENDATION: This patient is receiving Protonix by the intravenous route.  Based on criteria approved by the Pharmacy and Therapeutics Committee, the intravenous medication(s) is/are being converted to the equivalent oral dose form(s).   DESCRIPTION: These criteria include:  The patient is eating (either orally or via tube) and/or has been taking other orally administered medications for a least 24 hours  The patient has no evidence of active gastrointestinal bleeding or impaired GI absorption (gastrectomy, short bowel, patient on TNA or NPO).  If you have questions about this conversion, please contact the Pharmacy Department    [phone] )  [facility]   [phone] )  [facility]    [phone] )  [facility]   [phone] )  [facility]  Earl Many Gross, [facility] 07/22/2015 8:14 AM

## 2015-07-23 MED ORDER — HYDROCODONE-ACETAMINOPHEN 5-325 MG PO TABS: 1.0000 | ORAL_TABLET | ORAL | Status: DC | PRN

## 2015-07-23 NOTE — Progress Notes (Signed)
Assessment unchanged. Pt and husband verbalized understanding of dc instructions from MD as well as nurse through teach back including follow up care and when to call the doctor. Script x 1 given as provided by MD. New abd binder obtained for pt prior to dc. Discharged via wc to front entrance to meet awaiting vehicle to carry home. Accompanied by husband and NT.

## 2015-07-23 NOTE — Discharge Instructions (Signed)
Laparoscopic Ventral Hernia Repair  Care After  Refer to this sheet in the next few weeks. These instructions provide you with information on caring for yourself after your procedure. Your caregiver may also give you more specific instructions. Your treatment has been planned according to current medical practices, but problems sometimes occur. Call your caregiver if you have any problems or questions after your procedure.   HOME CARE INSTRUCTIONS   · Only take over-the-counter or prescription medicines as directed by your caregiver. If antibiotic medicines are prescribed, take them as directed. Finish them even if you start to feel better.  · Always wash your hands before touching your abdomen.  · Take your bandages (dressings) off after 48 hours or as directed by your caregiver. You may have skin adhesive strips or skin glue over the surgical cuts (incisions). Do not take the strips off or peel the glue off. These will fall off on their own.  · Take showers once your caregiver approves. Until then, only take sponge baths. Do not take tub baths or go swimming until your caregiver approves.  · Check your incision area every day for swelling, redness, warmth, and blood or fluid leaking from the incision. These are signs of infection. Wash your hands before you check.  · Hold a pillow over your abdomen when you cough or sneeze to help ease pain.  · Eat foods that are high in fiber, such as whole grains, fruits, and vegetables. Fiber helps prevent difficult bowel movements (constipation).  · Drink enough fluids to keep your urine clear or pale yellow.  · Rest and lessen activity for 4-5 days after the surgery. You may take short walks if your caregiver approves. Do not drive until approved by your caregiver.  · Do not lift anything heavy, participate in sports, or have sexual intercourse for 6-8 weeks or until your caregiver approves.    · Ask your caregiver when you can return to work.  · Keep all follow-up  appointments.  SEEK MEDICAL CARE IF:   · You have pain even after taking pain medicine.  · You have not had a bowel movement in 3 days.  · You have cramps or are nauseous.  SEEK IMMEDIATE MEDICAL CARE IF:   · You have pain or swelling that is getting worse.  · You have redness around your incisions.  · Your incision is pulling apart.  · You have blood or fluid leaking from your incisions.  · You are vomiting.  · You cannot pass urine.  MAKE SURE YOU:   · Understand these instructions.  · Will watch your condition.  · Will get help right away if you are not doing well or get worse.  Document Released: 11/04/2012 Document Reviewed: 11/04/2012  ExitCare® Patient Information ©2015 [employer]. This information is not intended to replace advice given to you by your health care provider. Make sure you discuss any questions you have with your health care provider.

## 2015-07-23 NOTE — Discharge Summary (Signed)
Physician Discharge Summary  Patient ID: CHYNA KNEECE MRN: [id number] DOB/AGE: 1941-02-07 74 y.o.  Admit date: 07/18/2015 Discharge date: 07/23/2015  Admission Diagnoses:  Incarcerated ventral hernia  Discharge Diagnoses:  same  Active Problems:   * No active hospital problems. *   Surgery:  Lap assisted repair of ventral hernia  Discharged Condition: improved  Hospital Course:   Had surgery.  Started on clears and advanced to regular diet.  Ready for discharge  Consults: none  Significant Diagnostic Studies: none    Discharge Exam: Blood pressure 107/75, pulse 65, temperature 98.2 F (36.8 C), temperature source Oral, resp. rate 18, height  (1.676 m), weight 109.7 kg (241 lb 13.5 oz), SpO2 95 %. Incisions ok  Disposition: 01-Home or Self Care  Discharge Instructions    Diet - low sodium heart healthy    Complete by:  As directed      Discharge instructions    Complete by:  As directed   Come to Dr. Allene Pyo office 10-14 days after your surgery for staple removal.  Call [phone] to schedule a nurse visit.    You may shower.  Apply neosporin ointment to the incisions.     Increase activity slowly    Complete by:  As directed             Medication List    TAKE these medications        acetaminophen 500 MG tablet  Commonly known as:  TYLENOL  Take 500 mg by mouth every 6 (six) hours as needed for mild pain.     amLODipine 10 MG tablet  Commonly known as:  NORVASC  Take 10 mg by mouth every morning.     aspirin EC 325 MG tablet  Take 325 mg by mouth daily.     atenolol 25 MG tablet  Commonly known as:  TENORMIN  Take 12.5 mg by mouth every morning.     benazepril 20 MG tablet  Commonly known as:  LOTENSIN  Take 20 mg by mouth every morning.     furosemide 20 MG tablet  Commonly known as:  LASIX  Take 20 mg by mouth daily.     HYDROcodone-acetaminophen 5-325 MG per tablet  Commonly known as:  NORCO/VICODIN  Take 1-2 tablets by mouth  every 4 (four) hours as needed for moderate pain.     omeprazole 20 MG capsule  Commonly known as:  PRILOSEC  Take 20 mg by mouth every morning.     potassium chloride 8 MEQ tablet  Commonly known as:  KLOR-CON  Take 8 mEq by mouth daily.     pregabalin 50 MG capsule  Commonly known as:  LYRICA  Take 50 mg by mouth daily.     vitamin D (CHOLECALCIFEROL) 400 UNITS tablet  Take 400 Units by mouth daily. D 3           Follow-up Information    Follow up with NEWMAN,DAVID H, MD In 3 weeks.   Specialty:  General Surgery   Contact information:   [address] [place] [address] [phone]       Signed: Valarie Merino 07/23/2015, 9:28 AM

## 2016-05-04 ENCOUNTER — Emergency Department (HOSPITAL_COMMUNITY): Payer: Medicare Other

## 2016-05-04 ENCOUNTER — Encounter (HOSPITAL_COMMUNITY): Payer: Self-pay

## 2016-05-04 ENCOUNTER — Emergency Department (HOSPITAL_COMMUNITY)
Admission: EM | Admit: 2016-05-04 | Discharge: 2016-05-04 | Disposition: A | Discharge status: Home / Self Care | Payer: Medicare Other | Attending: Emergency Medicine | Admitting: Emergency Medicine

## 2016-05-04 DIAGNOSIS — Z79899 Other long term (current) drug therapy: Secondary | ICD-10-CM | POA: Diagnosis not present

## 2016-05-04 DIAGNOSIS — R0789 Other chest pain: Secondary | ICD-10-CM | POA: Diagnosis present

## 2016-05-04 DIAGNOSIS — I10 Essential (primary) hypertension: Secondary | ICD-10-CM | POA: Insufficient documentation

## 2016-05-04 DIAGNOSIS — Z96652 Presence of left artificial knee joint: Secondary | ICD-10-CM | POA: Diagnosis not present

## 2016-05-04 DIAGNOSIS — Z8541 Personal history of malignant neoplasm of cervix uteri: Secondary | ICD-10-CM | POA: Diagnosis not present

## 2016-05-04 DIAGNOSIS — R079 Chest pain, unspecified: Secondary | ICD-10-CM

## 2016-05-04 DIAGNOSIS — Z7982 Long term (current) use of aspirin: Secondary | ICD-10-CM | POA: Insufficient documentation

## 2016-05-04 LAB — BASIC METABOLIC PANEL
Anion gap: 6 (ref 5–15)
BUN: 12 mg/dL (ref 6–20)
CO2: 26 mmol/L (ref 22–32)
Calcium: 9.3 mg/dL (ref 8.9–10.3)
Chloride: 107 mmol/L (ref 101–111)
Creatinine, Ser: 0.66 mg/dL (ref 0.44–1.00)
GFR calc Af Amer: >60 mL/min (ref >60)
GFR calc non Af Amer: >60 mL/min (ref >60)
Glucose, Bld: 97 mg/dL (ref 65–99)
Potassium: 4 mmol/L (ref 3.5–5.1)
Sodium: 139 mmol/L (ref 135–145)

## 2016-05-04 LAB — CBC
HCT: 39.7 % (ref 36.0–46.0)
Hemoglobin: 13.5 g/dL (ref 12.0–15.0)
MCH: 31.2 pg (ref 26.0–34.0)
MCHC: 34 g/dL (ref 30.0–36.0)
MCV: 91.7 fL (ref 78.0–100.0)
Platelets: 223 10*3/uL (ref 150–400)
RBC: 4.33 MIL/uL (ref 3.87–5.11)
RDW: 12.1 % (ref 11.5–15.5)
WBC: 3.7 10*3/uL — ABNORMAL LOW (ref 4.0–10.5)

## 2016-05-04 LAB — D-DIMER, QUANTITATIVE (NOT AT ARMC): D-Dimer, Quant: 1.33 ug/mL-FEU — ABNORMAL HIGH (ref 0.00–0.50)

## 2016-05-04 LAB — I-STAT TROPONIN, ED
Troponin i, poc: 0 ng/mL (ref 0.00–0.08)
Troponin i, poc: 0 ng/mL (ref 0.00–0.08)

## 2016-05-04 MED ORDER — IOPAMIDOL (ISOVUE-370) INJECTION 76%
INTRAVENOUS | Status: AC
  Administered 2016-05-04: 100 mL
  Filled 2016-05-04: qty 100

## 2016-05-04 MED ORDER — ACETAMINOPHEN 500 MG PO TABS
1000.0000 mg | ORAL_TABLET | Freq: Once | ORAL | Status: AC
  Administered 2016-05-04: 1000 mg via ORAL
  Filled 2016-05-04: qty 2

## 2016-05-04 MED ORDER — SODIUM CHLORIDE 0.9 % IV BOLUS (SEPSIS)
1000.0000 mL | Freq: Once | INTRAVENOUS | Status: AC
  Administered 2016-05-04: 1000 mL via INTRAVENOUS

## 2016-05-04 MED ORDER — ASPIRIN 81 MG PO CHEW
324.0000 mg | CHEWABLE_TABLET | Freq: Once | ORAL | Status: AC
  Administered 2016-05-04: 324 mg via ORAL
  Filled 2016-05-04: qty 4

## 2016-05-04 NOTE — Discharge Instructions (Signed)
Nonspecific Chest Pain  °Chest pain can be caused by many different conditions. There is always a chance that your pain could be related to something serious, such as a heart attack or a blood clot in your lungs. Chest pain can also be caused by conditions that are not life-threatening. If you have chest pain, it is very important to follow up with your health care provider. °CAUSES  °Chest pain can be caused by: °· Heartburn. °· Pneumonia or bronchitis. °· Anxiety or stress. °· Inflammation around your heart (pericarditis) or lung (pleuritis or pleurisy). °· A blood clot in your lung. °· A collapsed lung (pneumothorax). It can develop suddenly on its own (spontaneous pneumothorax) or from trauma to the chest. °· Shingles infection (varicella-zoster virus). °· Heart attack. °· Damage to the bones, muscles, and cartilage that make up your chest wall. This can include: °¨ Bruised bones due to injury. °¨ Strained muscles or cartilage due to frequent or repeated coughing or overwork. °¨ Fracture to one or more ribs. °¨ Sore cartilage due to inflammation (costochondritis). °RISK FACTORS  °Risk factors for chest pain may include: °· Activities that increase your risk for trauma or injury to your chest. °· Respiratory infections or conditions that cause frequent coughing. °· Medical conditions or overeating that can cause heartburn. °· Heart disease or family history of heart disease. °· Conditions or health behaviors that increase your risk of developing a blood clot. °· Having had chicken pox (varicella zoster). °SIGNS AND SYMPTOMS °Chest pain can feel like: °· Burning or tingling on the surface of your chest or deep in your chest. °· Crushing, pressure, aching, or squeezing pain. °· Dull or sharp pain that is worse when you move, cough, or take a deep breath. °· Pain that is also felt in your back, neck, shoulder, or arm, or pain that spreads to any of these areas. °Your chest pain may come and go, or it may stay  constant. °DIAGNOSIS °Lab tests or other studies may be needed to find the cause of your pain. Your health care provider may have you take a test called an ambulatory ECG (electrocardiogram). An ECG records your heartbeat patterns at the time the test is performed. You may also have other tests, such as: °· Transthoracic echocardiogram (TTE). During echocardiography, sound waves are used to create a picture of all of the heart structures and to look at how blood flows through your heart. °· Transesophageal echocardiogram (TEE). This is a more advanced imaging test that obtains images from inside your body. It allows your health care provider to see your heart in finer detail. °· Cardiac monitoring. This allows your health care provider to monitor your heart rate and rhythm in real time. °· Holter monitor. This is a portable device that records your heartbeat and can help to diagnose abnormal heartbeats. It allows your health care provider to track your heart activity for several days, if needed. °· Stress tests. These can be done through exercise or by taking medicine that makes your heart beat more quickly. °· Blood tests. °· Imaging tests. °TREATMENT  °Your treatment depends on what is causing your chest pain. Treatment may include: °· Medicines. These may include: °¨ Acid blockers for heartburn. °¨ Anti-inflammatory medicine. °¨ Pain medicine for inflammatory conditions. °¨ Antibiotic medicine, if an infection is present. °¨ Medicines to dissolve blood clots. °¨ Medicines to treat coronary artery disease. °· Supportive care for conditions that do not require medicines. This may include: °¨ Resting. °¨ Applying heat   or cold packs to injured areas. °¨ Limiting activities until pain decreases. °HOME CARE INSTRUCTIONS °· If you were prescribed an antibiotic medicine, finish it all even if you start to feel better. °· Avoid any activities that bring on chest pain. °· Do not use any tobacco products, including  cigarettes, chewing tobacco, or electronic cigarettes. If you need help quitting, ask your health care provider. °· Do not drink alcohol. °· Take medicines only as directed by your health care provider. °· Keep all follow-up visits as directed by your health care provider. This is important. This includes any further testing if your chest pain does not go away. °· If heartburn is the cause for your chest pain, you may be told to keep your head raised (elevated) while sleeping. This reduces the chance that acid will go from your stomach into your esophagus. °· Make lifestyle changes as directed by your health care provider. These may include: °¨ Getting regular exercise. Ask your health care provider to suggest some activities that are safe for you. °¨ Eating a heart-healthy diet. A registered dietitian can help you to learn healthy eating options. °¨ Maintaining a healthy weight. °¨ Managing diabetes, if necessary. °¨ Reducing stress. °SEEK MEDICAL CARE IF: °· Your chest pain does not go away after treatment. °· You have a rash with blisters on your chest. °· You have a fever. °SEEK IMMEDIATE MEDICAL CARE IF:  °· Your chest pain is worse. °· You have an increasing cough, or you cough up blood. °· You have severe abdominal pain. °· You have severe weakness. °· You faint. °· You have chills. °· You have sudden, unexplained chest discomfort. °· You have sudden, unexplained discomfort in your arms, back, neck, or jaw. °· You have shortness of breath at any time. °· You suddenly start to sweat, or your skin gets clammy. °· You feel nauseous or you vomit. °· You suddenly feel light-headed or dizzy. °· Your heart begins to beat quickly, or it feels like it is skipping beats. °These symptoms may represent a serious problem that is an emergency. Do not wait to see if the symptoms will go away. Get medical help right away. Call your local emergency services (911 in the [place] Do not drive yourself to the hospital. °  °This  information is not intended to replace advice given to you by your health care provider. Make sure you discuss any questions you have with your health care provider. °  °Document Released: 08/28/2005 Document Revised: 12/09/2014 Document Reviewed: 06/24/2014 °Elsevier Interactive Patient Education ©2016 [employer] ° °

## 2016-05-04 NOTE — ED Provider Notes (Signed)
CSN: [id number]     Arrival date & time 05/04/16  1246 History   First MD Initiated Contact with Patient 05/04/16 1318     Chief Complaint  Patient presents with  . Chest Pain     (Consider location/radiation/quality/duration/timing/severity/associated sxs/prior Treatment) HPI  75 year old female presents with left-sided chest pain. Has been ongoing for 2 weeks. Patient states the pain comes and goes, lasting a couple minutes at a time. Nothing specifically makes it come and go, mostly seems to come at rest. Is described as an aching sensation. Patient states there is some pain with breathing. She also feels short of breath. This morning at 5 AM the pain was worse than it has been for the other 2 weeks. No vomiting. No diaphoresis. No abdominal pain or back pain. Denies any numbness or weakness. Pain is essentially gone right now but she still feels sore. Has not noticed any rash.  Past Medical History  Diagnosis Date  . Arthritis   . Fibromyalgia   . Hypertension   . GERD (gastroesophageal reflux disease)   . H/O hiatal hernia   . Bradycardia   . 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 Urge urinary incontinence   . History of uterine cancer 1975    s/p hysterectomy  . Vitamin D deficiency   . Pneumonia yrs ago  . Chest pain 06-10-14    had chest pain thia am  . Shortness of breath dyspnea   . Numbness and tingling     hands and feet bilat   . Tinnitus   . Hearing loss     left ear   . Dizziness   . Bronchitis     hx of   . Urinary incontinence   . Anemia     pt denies   Past Surgical History  Procedure Laterality Date  . Tympanoplasty Left   . Total knee arthroplasty Left 08/03/2013    Procedure: LEFT TOTAL KNEE ARTHROPLASTY;  Surgeon: Shelda PalMatthew D Olin, MD;  Location: [facility];  Service: Orthopedics;  Laterality: Left;  . Total abdominal hysterectomy w/ bilateral salpingoophorectomy  1972  . Tracheotomy    . Cervical fusion  2011  . Knee closed  reduction Left 09/21/2013    Procedure: CLOSED MANIPULATION LEFT KNEE;  Surgeon: Shelda PalMatthew D Olin, MD;  [facility];  Service: Orthopedics;  Laterality: Left;  . Cholecystectomy N/A 06/14/2014    Procedure: LAPAROSCOPIC CHOLECYSTECTOMY WITH attempted INTRAOPERATIVE CHOLANGIOGRAM;  Surgeon: Kandis Cockingavid H Newman, MD;  Location: [facility];  Service: General;  Laterality: N/A;  . Ventral hernia repair N/A 07/18/2015    Procedure: LAPAROSCOPIC VENTRAL AND UMBILICAL  HERNIA LYSIS OF ADHESIONS;  Surgeon: Ovidio Kinavid Newman, MD;  Location: [facility];  Service: General;  Laterality: N/A;  . Insertion of mesh N/A 07/18/2015    Procedure: INSERTION OF MESH;  Surgeon: Ovidio Kinavid Newman, MD;  Location: [facility];  Service: General;  Laterality: N/A;   Family History  Problem Relation Age of Onset  . Stroke Mother   . Stroke Father    Social History  Substance Use Topics  . Smoking status: Never Smoker   . Smokeless tobacco: Never Used  . Alcohol Use: No   OB History    No data available     Review of Systems  Constitutional: Negative for diaphoresis.  Respiratory: Positive for shortness of breath.   Cardiovascular: Positive for chest pain. Negative for leg swelling.  Gastrointestinal: Negative for vomiting  and abdominal pain.  Musculoskeletal: Negative for back pain.  All other systems reviewed and are negative.     Allergies  Codeine and Zofran  Home Medications   Prior to Admission medications   Medication Sig Start Date End Date Taking? Authorizing Provider  amLODipine (NORVASC) 10 MG tablet Take 10 mg by mouth every morning.   Yes Historical Provider, MD  aspirin EC 325 MG tablet Take 325 mg by mouth daily.   Yes Historical Provider, MD  atenolol (TENORMIN) 25 MG tablet Take 12.5 mg by mouth every morning.   Yes Historical Provider, MD  benazepril (LOTENSIN) 20 MG tablet Take 20 mg by mouth every morning.   Yes Historical Provider, MD  furosemide (LASIX) 20 MG tablet Take 20 mg by  mouth daily.   Yes Historical Provider, MD  omeprazole (PRILOSEC) 20 MG capsule Take 20 mg by mouth every morning.    Yes Historical Provider, MD  potassium chloride (KLOR-CON) 8 MEQ tablet Take 8 mEq by mouth daily.  04/17/15  Yes Historical Provider, MD  pregabalin (LYRICA) 50 MG capsule Take 50 mg by mouth daily.   Yes Historical Provider, MD  vitamin D, CHOLECALCIFEROL, 400 UNITS tablet Take 400 Units by mouth daily. D 3   Yes Historical Provider, MD  acetaminophen (TYLENOL) 500 MG tablet Take 500 mg by mouth every 6 (six) hours as needed for mild pain.    Historical Provider, MD   BP 138/76 mmHg  Pulse 63  Temp(Src) 98.6 F (37 C) (Oral)  Resp 18  Ht  (1.676 m)  Wt 214 lb 8 oz (97.297 kg)  BMI 34.64 kg/m2  SpO2 100% Physical Exam  Constitutional: She is oriented to person, place, and time. She appears well-developed and well-nourished. No distress.  HENT:  Head: Normocephalic and atraumatic.  Right Ear: External ear normal.  Left Ear: External ear normal.  Nose: Nose normal.  Eyes: Right eye exhibits no discharge. Left eye exhibits no discharge.  Cardiovascular: Normal rate, regular rhythm and normal heart sounds.   Pulmonary/Chest: Effort normal and breath sounds normal. She exhibits tenderness.    Abdominal: Soft. There is no tenderness.  Neurological: She is alert and oriented to person, place, and time.  Skin: Skin is warm and dry. She is not diaphoretic.  Nursing note and vitals reviewed.   ED Course  Procedures (including critical care time) Labs Review Labs Reviewed  CBC - Abnormal; Notable for the following:    WBC 3.7 (*)    All other components within normal limits  D-DIMER, QUANTITATIVE (NOT AT [facility]) - Abnormal; Notable for the following:    D-Dimer, Quant 1.33 (*)    All other components within normal limits  BASIC METABOLIC PANEL  I-STAT TROPOININ, ED    Imaging Review Dg Chest 2 View  05/04/2016  CLINICAL DATA:  Intermittent left-sided chest  pain and shortness of breath for 2 weeks. EXAM: CHEST  2 VIEW COMPARISON:  07/13/2015 FINDINGS: Cardiac silhouette is normal in size. Tortuosity and calcification of the thoracic aorta are unchanged. No confluent airspace opacity, edema, pleural effusion, or pneumothorax is identified. Thoracic spondylosis is noted. Prior cervical spine fusion. IMPRESSION: No active cardiopulmonary disease. Electronically Signed   By: Sebastian Ache M.D.   On: 05/04/2016 13:29   I have personally reviewed and evaluated these images and lab results as part of my medical decision-making.   EKG Interpretation   Date/Time:  Saturday May 04 2016 12:54:10 EDT Ventricular Rate:  66 PR Interval:  170 QRS Duration: 94 QT Interval:  394 QTC Calculation: 413 R Axis:   19 Text Interpretation:  Normal sinus rhythm Normal ECG no significant change  since Aug 2016 Confirmed by Criss Alvine MD, Shawntae Lowy [id number]) on 05/04/2016  12:59:30 PM       EKG Interpretation  Date/Time:  Saturday May 04 2016 14:32:32 EDT Ventricular Rate:  53 PR Interval:  194 QRS Duration: 103 QT Interval:  433 QTC Calculation: 406 R Axis:   28 Text Interpretation:  Sinus bradycardia Abnormal R-wave progression, early transition no significant change since ealier in the day Confirmed by Zarai Orsborn MD, Patrecia Veiga [phone]) on 05/04/2016 3:17:13 PM       MDM   Final diagnoses:  Left sided chest pain    Patient's pain is atypical, reproducible and going on for 2 weeks. I doubt ACS. While she is elderly and has history of HTN, I think ACS is unlikely. More likely chest wall in etiology. Given pleuritic symptoms, Ddimer sent and is elevated. Will get CT to r/o PE. Had discussion with patient, she is ok with discharge and follow up with PCP in 2 days. Discussed strict return precautions. If negative 2nd troponin and negative CTA, plan for d/c home. Care to Dr. Clydene Pugh with CTA pending.    Pricilla Loveless, MD 05/04/16 [phone]

## 2016-05-04 NOTE — ED Provider Notes (Signed)
Care assumed from Dr. Criss Alvine at 1600 with plan for f/u CT and delta trop and d/c if negative.   Results:  BP 134/78 mmHg  Pulse 58  Temp(Src) 98.6 F (37 C) (Oral)  Resp 20  Ht  (1.676 m)  Wt 214 lb 8 oz (97.297 kg)  BMI 34.64 kg/m2  SpO2 100%  Results for orders placed or performed during the hospital encounter of 05/04/16  Basic metabolic panel  Result Value Ref Range   Sodium 139 135 - 145 mmol/L   Potassium 4.0 3.5 - 5.1 mmol/L   Chloride 107 101 - 111 mmol/L   CO2 26 22 - 32 mmol/L   Glucose, Bld 97 65 - 99 mg/dL   BUN 12 6 - 20 mg/dL   Creatinine, Ser 4.09 0.44 - 1.00 mg/dL   Calcium 9.3 8.9 - 81.1 mg/dL   GFR calc non Af Amer >60 >60 mL/min   GFR calc Af Amer >60 >60 mL/min   Anion gap 6 5 - 15  CBC  Result Value Ref Range   WBC 3.7 (L) 4.0 - 10.5 K/uL   RBC 4.33 3.87 - 5.11 MIL/uL   Hemoglobin 13.5 12.0 - 15.0 g/dL   HCT 91.4 78.2 - 95.6 %   MCV 91.7 78.0 - 100.0 fL   MCH 31.2 26.0 - 34.0 pg   MCHC 34.0 30.0 - 36.0 g/dL   RDW 21.3 08.6 - 57.8 %   Platelets 223 150 - 400 K/uL  D-dimer, quantitative (not at [facility])  Result Value Ref Range   D-Dimer, Quant 1.33 (H) 0.00 - 0.50 ug/mL-FEU  I-stat troponin, ED  Result Value Ref Range   Troponin i, poc 0.00 0.00 - 0.08 ng/mL   Comment 3          I-stat troponin, ED  Result Value Ref Range   Troponin i, poc 0.00 0.00 - 0.08 ng/mL   Comment 3            Dg Chest 2 View  05/04/2016  CLINICAL DATA:  Intermittent left-sided chest pain and shortness of breath for 2 weeks. EXAM: CHEST  2 VIEW COMPARISON:  07/13/2015 FINDINGS: Cardiac silhouette is normal in size. Tortuosity and calcification of the thoracic aorta are unchanged. No confluent airspace opacity, edema, pleural effusion, or pneumothorax is identified. Thoracic spondylosis is noted. Prior cervical spine fusion. IMPRESSION: No active cardiopulmonary disease. Electronically Signed   By: Sebastian Ache M.D.   On: 05/04/2016 13:29   Ct Angio Chest Pe W/cm  &/or Wo Cm  05/04/2016  CLINICAL DATA:  Left upper chest pain for 2 weeks. Increasing shortness of breath. EXAM: CT ANGIOGRAPHY CHEST WITH CONTRAST TECHNIQUE: Multidetector CT imaging of the chest was performed using the standard protocol during bolus administration of intravenous contrast. Multiplanar CT image reconstructions and MIPs were obtained to evaluate the vascular anatomy. CONTRAST:  100 cc Isovue 370 IV COMPARISON:  02/14/2007.  Chest x-ray earlier today. FINDINGS: Mediastinum/Nodes: Heart is normal size. Aorta is normal caliber. No mediastinal, hilar, or axillary adenopathy. No evidence of pulmonary embolus. Lungs/Pleura: Lungs are clear. No focal airspace opacities or suspicious nodules. No effusions. Upper abdomen: Imaging into the upper abdomen shows no acute findings. Musculoskeletal: Chest wall soft tissues are unremarkable. No acute bony abnormality or focal bone lesion. Anterior flowing osteophytes throughout the mid and lower thoracic spine. Review of the MIP images confirms the above findings. IMPRESSION: No evidence of pulmonary embolus. No acute findings.  S Electronically Signed   By:  Charlett NoseKevin  Dover M.D.   On: 05/04/2016 16:20    Radiology and laboratory examinations were reviewed by me and used in medical decision making if performed.   MDM:  Studies negative. HDS and no change through ED observation course. Plan to follow up with PCP as needed and return precautions discussed for worsening or new concerning symptoms.   Diagnoses that have been ruled out:  None  Diagnoses that are still under consideration:  None  Final diagnoses:  Left sided chest pain     Lyndal Pulleyaniel Ardean Simonich, MD 05/04/16 1711

## 2016-05-04 NOTE — ED Notes (Signed)
Patient verbalized understanding of discharge instructions and denies any further needs or questions at this time. VS stable. Patient ambulatory with steady gait, assisted to ED entrance in wheelchair.  

## 2016-05-04 NOTE — ED Notes (Addendum)
Patient here with intermittent left anterior chest pain with shortness of breath and dizziness for 2 weeks. Sent from [place] this am for further evaluation. Complains of chest soreness on arrival. Describes the pain as intermittent and last night pain worse.

## 2016-06-09 IMAGING — CR DG CERVICAL SPINE COMPLETE 4+V
7 series · 7 of 7 positions shown · non-contrast
Comparison: None.

CLINICAL DATA: Neck does look will but ago arthropathy scan
on the at length view on

EXAM:
CERVICAL SPINE  4+ VIEWS

[w cervical spine lat]
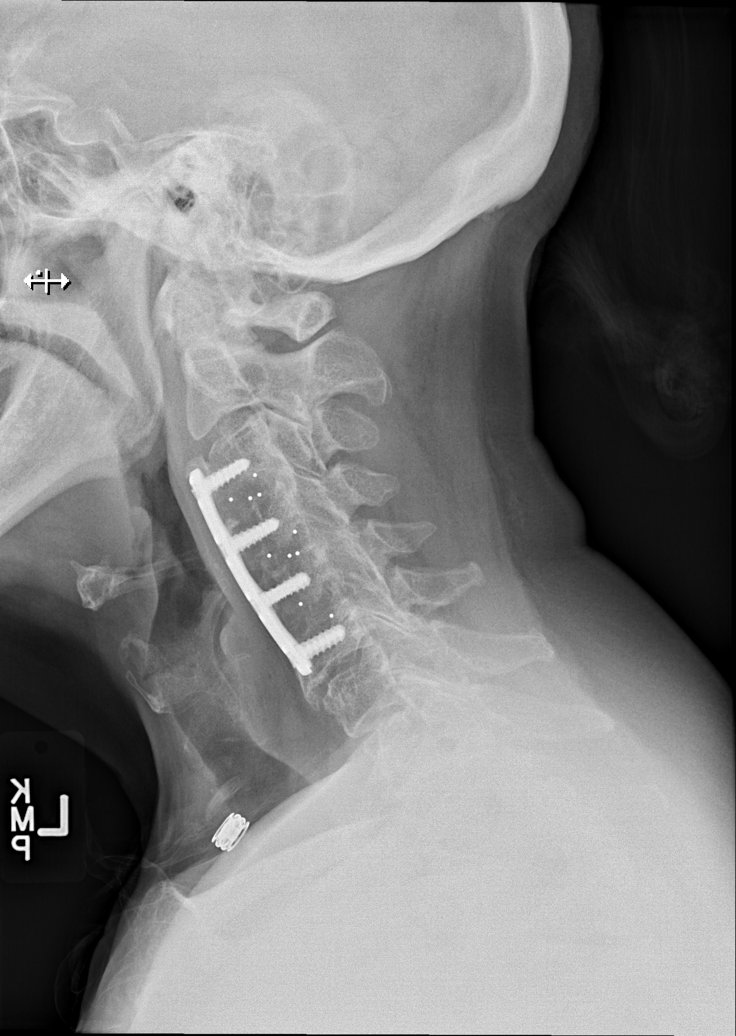

[w cervical spine ap_obl (1 of 3)]
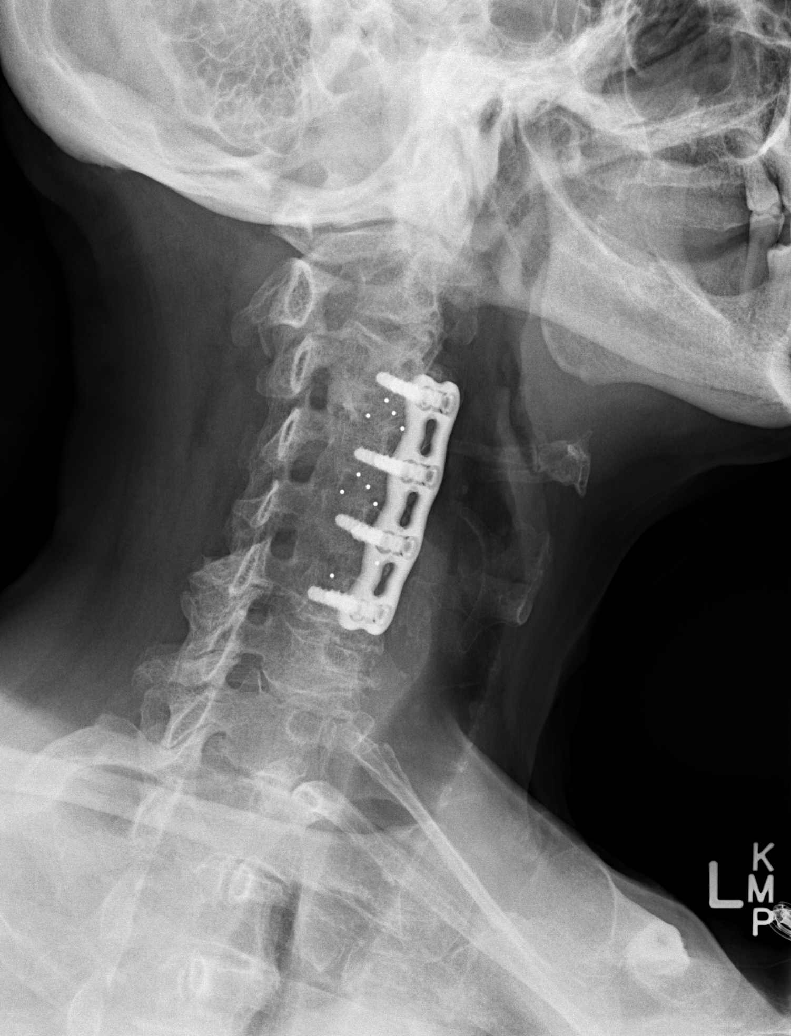

[w cervical spine ap_obl (2 of 3)]
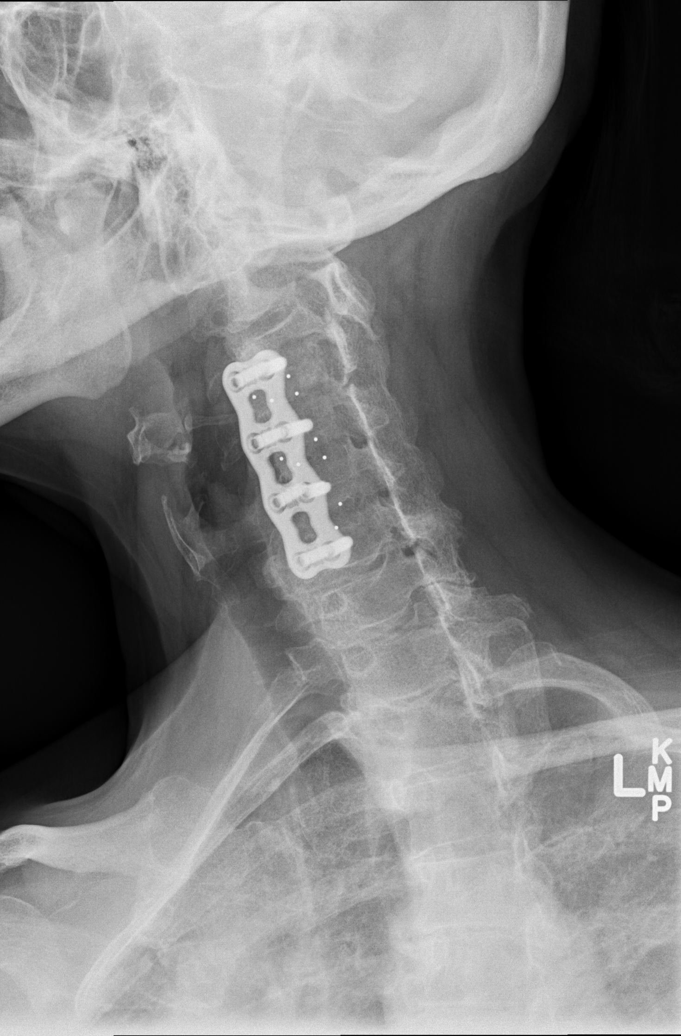

[w cervical spine ap_obl (3 of 3)]
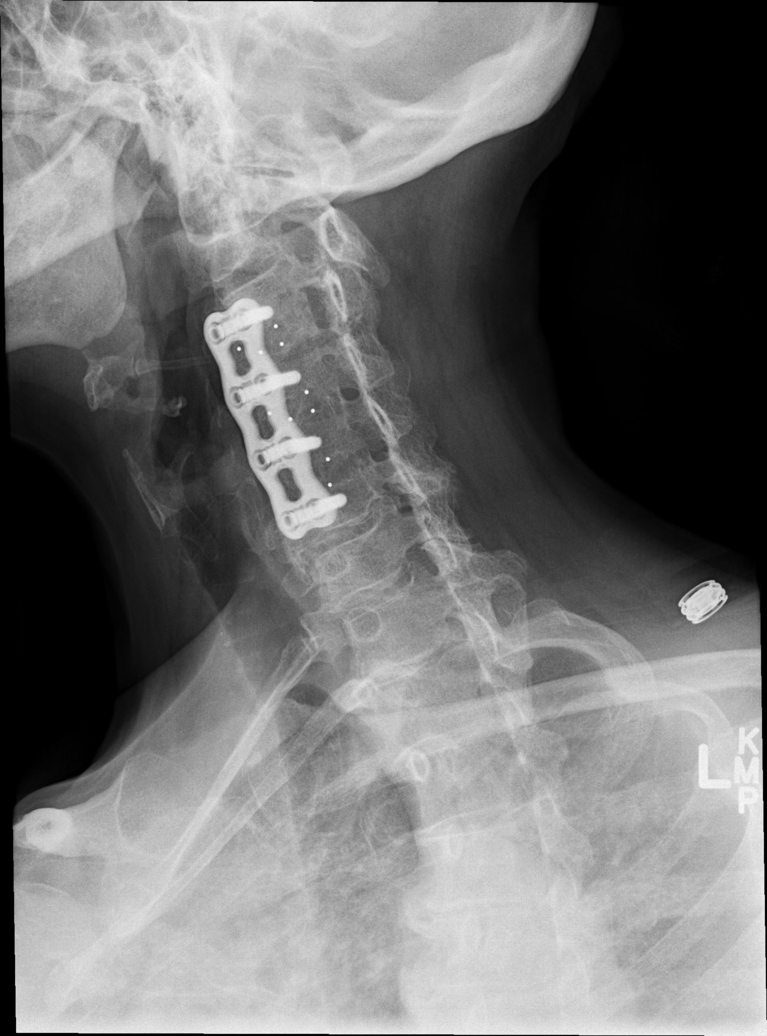

[w cervical spine ap]
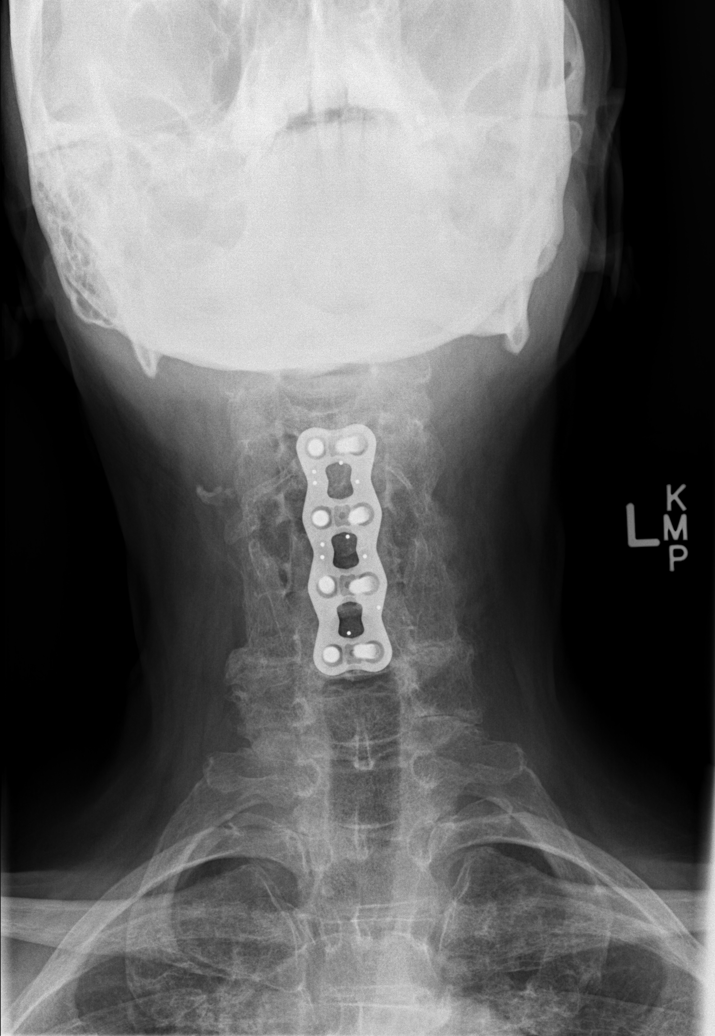

[w cervical spine odontoid (1 of 2)]
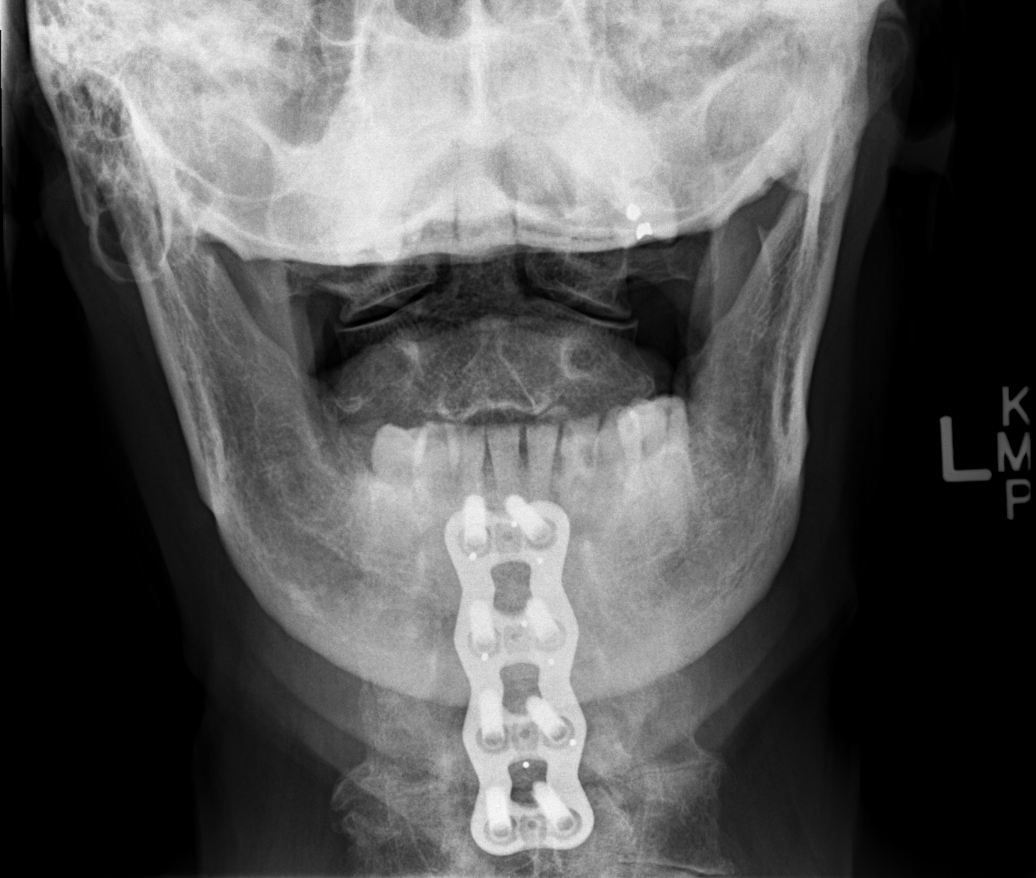

[w cervical spine odontoid (2 of 2)]
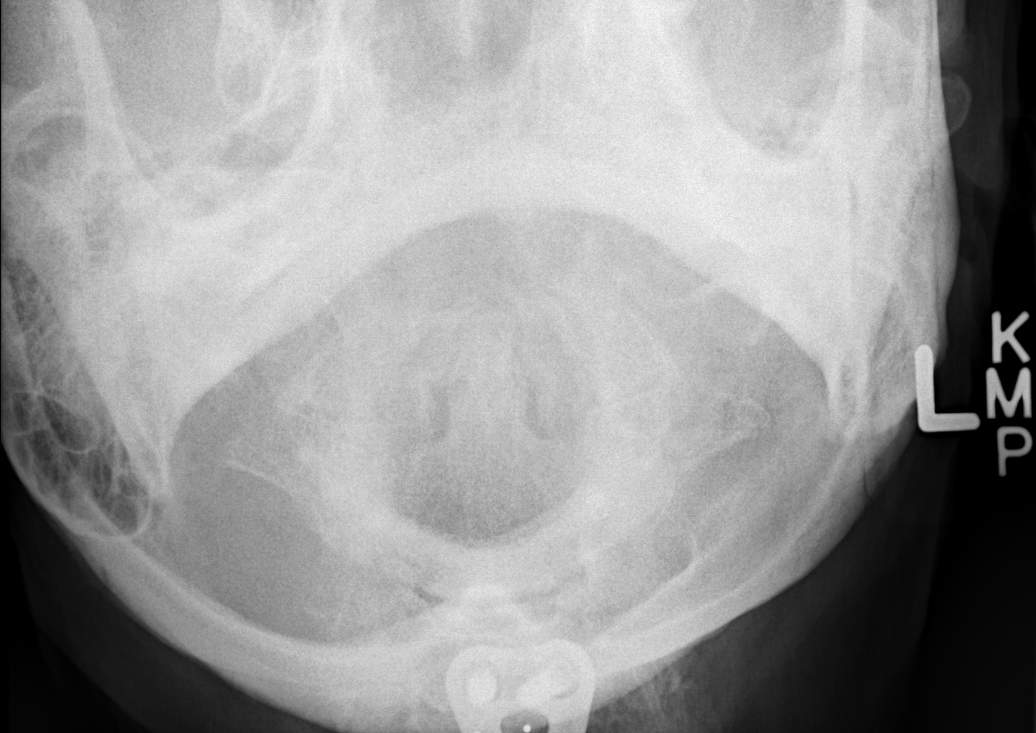

[7 of 7 positions shown; findings below may reference images not displayed]

FINDINGS: Anterior cervical fusion from C3 through C6. Normal alignment.
Interbody disc fusion noted. No fracture identified. Moderate
spondylosis at C7-T1. Right carotid arterial calcification.
IMPRESSION: Normal alignment.  Postsurgical change with no acute findings.

## 2016-09-24 ENCOUNTER — Emergency Department (HOSPITAL_COMMUNITY): Payer: Medicare Other

## 2016-09-24 ENCOUNTER — Emergency Department (HOSPITAL_COMMUNITY)
Admission: EM | Admit: 2016-09-24 | Discharge: 2016-09-24 | Disposition: A | Discharge status: Home / Self Care | Payer: Medicare Other | Attending: Emergency Medicine | Admitting: Emergency Medicine

## 2016-09-24 ENCOUNTER — Encounter (HOSPITAL_COMMUNITY): Payer: Self-pay | Admitting: *Deleted

## 2016-09-24 DIAGNOSIS — Z96652 Presence of left artificial knee joint: Secondary | ICD-10-CM | POA: Insufficient documentation

## 2016-09-24 DIAGNOSIS — I10 Essential (primary) hypertension: Secondary | ICD-10-CM | POA: Diagnosis not present

## 2016-09-24 DIAGNOSIS — R079 Chest pain, unspecified: Secondary | ICD-10-CM | POA: Diagnosis present

## 2016-09-24 DIAGNOSIS — R0789 Other chest pain: Secondary | ICD-10-CM

## 2016-09-24 LAB — COMPREHENSIVE METABOLIC PANEL
ALT: 9 U/L — ABNORMAL LOW (ref 14–54)
AST: 17 U/L (ref 15–41)
Albumin: 4 g/dL (ref 3.5–5.0)
Alkaline Phosphatase: 68 U/L (ref 38–126)
Anion gap: 7 (ref 5–15)
BUN: 11 mg/dL (ref 6–20)
CO2: 26 mmol/L (ref 22–32)
Calcium: 8.9 mg/dL (ref 8.9–10.3)
Chloride: 107 mmol/L (ref 101–111)
Creatinine, Ser: 0.59 mg/dL (ref 0.44–1.00)
GFR calc Af Amer: >60 mL/min (ref >60)
GFR calc non Af Amer: >60 mL/min (ref >60)
Glucose, Bld: 86 mg/dL (ref 65–99)
Potassium: 3.5 mmol/L (ref 3.5–5.1)
Sodium: 140 mmol/L (ref 135–145)
Total Bilirubin: 0.9 mg/dL (ref 0.3–1.2)
Total Protein: 7.4 g/dL (ref 6.5–8.1)

## 2016-09-24 LAB — I-STAT TROPONIN, ED
Troponin i, poc: 0 ng/mL (ref 0.00–0.08)
Troponin i, poc: 0 ng/mL (ref 0.00–0.08)

## 2016-09-24 LAB — CBC WITH DIFFERENTIAL/PLATELET
Basophils Absolute: 0 10*3/uL (ref 0.0–0.1)
Basophils Relative: 0 %
Eosinophils Absolute: 0.1 10*3/uL (ref 0.0–0.7)
Eosinophils Relative: 2 %
HCT: 38.3 % (ref 36.0–46.0)
Hemoglobin: 13.1 g/dL (ref 12.0–15.0)
Lymphocytes Relative: 33 %
Lymphs Abs: 1.3 10*3/uL (ref 0.7–4.0)
MCH: 31.6 pg (ref 26.0–34.0)
MCHC: 34.2 g/dL (ref 30.0–36.0)
MCV: 92.5 fL (ref 78.0–100.0)
Monocytes Absolute: 0.2 10*3/uL (ref 0.1–1.0)
Monocytes Relative: 6 %
Neutro Abs: 2.4 10*3/uL (ref 1.7–7.7)
Neutrophils Relative %: 59 %
Platelets: 195 10*3/uL (ref 150–400)
RBC: 4.14 MIL/uL (ref 3.87–5.11)
RDW: 12.6 % (ref 11.5–15.5)
WBC: 4 10*3/uL (ref 4.0–10.5)

## 2016-09-24 LAB — URINE MICROSCOPIC-ADD ON
Bacteria, UA: NONE SEEN
RBC / HPF: NONE SEEN RBC/hpf (ref 0–5)

## 2016-09-24 LAB — URINALYSIS, ROUTINE W REFLEX MICROSCOPIC
Bilirubin Urine: NEGATIVE
Glucose, UA: NEGATIVE mg/dL
Hgb urine dipstick: NEGATIVE
Ketones, ur: NEGATIVE mg/dL
Nitrite: NEGATIVE
Protein, ur: NEGATIVE mg/dL
Specific Gravity, Urine: 1.007 (ref 1.005–1.030)
pH: 7 (ref 5.0–8.0)

## 2016-09-24 LAB — LIPASE, BLOOD: Lipase: 21 U/L (ref 11–51)

## 2016-09-24 MED ORDER — ASPIRIN 81 MG PO CHEW
324.0000 mg | CHEWABLE_TABLET | Freq: Once | ORAL | Status: AC
  Administered 2016-09-24: 324 mg via ORAL
  Filled 2016-09-24: qty 4

## 2016-09-24 MED ORDER — ACETAMINOPHEN 325 MG PO TABS
650.0000 mg | ORAL_TABLET | Freq: Once | ORAL | Status: AC
  Administered 2016-09-24: 650 mg via ORAL
  Filled 2016-09-24: qty 2

## 2016-09-24 NOTE — Discharge Instructions (Addendum)
Your labs, chest xray, and EKG today do no show an emergent cause for you pain.  It is likely chest wall pain.  You may take Tylenol or Advil for pain.  It is important to follow up with your primary care physician as soon as possible to schedule a stress test within the next 30 days.  Return to the ED if you experience sudden worsening pain, dizziness, shortness of breath, or any new symptoms.

## 2016-09-24 NOTE — ED Notes (Signed)
Patient transported to X-ray 

## 2016-09-24 NOTE — ED Triage Notes (Signed)
Per pt report: pt presents with intermittent chest pain that began yesterday.  Pt reports the chest pain is sharp in nature. Pt also reports some nausea and intermittent SOB and back pain. Pt also reports abd pain and hasn't eaten since this am.  Pt denies having an cardiac problems or having pain like this before.  Pt did not take any medications. Pt also reports having chills.

## 2016-09-24 NOTE — ED Provider Notes (Signed)
[facility] Provider Note   CSN: [id number] Arrival date & time: 09/24/16  1426     History   Chief Complaint Chief Complaint  Patient presents with  . Chest Pain  . Abdominal Pain    HPI Phyllis Knight is a 75 y.o. female.  HPI Phyllis Knight is a 75 y.o. female with PMH significant for anemia, fibromyalgia, and HTN who presents with sudden onset, moderate, sharp, non-radiating left sided chest pain that began yesterday while at rest.  Associated symptoms include SOB, cough, left upper back pain, nausea, LUQ abdominal pain, and urinary frequency.  No fever, dizziness, numbness, weakness, vomiting, diarrhea, constipation, bloody stools, hematuria, or dysuria. Last BM this morning, normal. No cardiac history.  She states this feels similar to her chest pain back in June.  At that time she had an elevated d-dimer, but negative CTA of the chest, negative delta troponin. She has not taken anything for her symptoms.  She states they are worse with movement, but not exertion.  Abdominal surgeries include hysterectomy, cholecystectomy, and ventral hernia repair.  No hx of DVT/PE, hormone use, recent prolonged immobilization.   Past Medical History:  Diagnosis Date  . Anemia    pt denies  . Arthritis   . Bradycardia   . Bronchitis    hx of   . Chest pain 06-10-14   had chest pain thia am  . Dizziness   . Fibromyalgia   . 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 GERD (gastroesophageal reflux disease)   . H/O hiatal hernia   . Hearing loss    left ear   . History of uterine cancer 1975   s/p hysterectomy  . Hypertension   . Numbness and tingling    hands and feet bilat   . Pneumonia yrs ago  . Shortness of breath dyspnea   . Tinnitus   . Urge urinary incontinence   . Urinary incontinence   . Vitamin D deficiency     Patient Active Problem List   Diagnosis Date Noted  . 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 Expected blood loss anemia 08/04/2013   . Obese 08/04/2013  . 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 CERVICAL FUSION  2011  . CHOLECYSTECTOMY N/A 06/14/2014   Procedure: LAPAROSCOPIC CHOLECYSTECTOMY WITH attempted INTRAOPERATIVE CHOLANGIOGRAM;  Surgeon: [name], MD;  Location: [facility];  Service: General;  Laterality: N/A;  . INSERTION OF MESH N/A 07/18/2015   Procedure: INSERTION OF MESH;  Surgeon: Ovidio Kin, MD;  Location: [facility];  Service: General;  Laterality: N/A;  . KNEE CLOSED REDUCTION Left 09/21/2013   Procedure: CLOSED MANIPULATION LEFT KNEE;  Surgeon: Shelda Pal, MD;  Location: [facility];  Service: Orthopedics;  Laterality: Left;  . TOTAL ABDOMINAL HYSTERECTOMY W/ BILATERAL SALPINGOOPHORECTOMY  1972  . TOTAL KNEE ARTHROPLASTY Left 08/03/2013   Procedure: LEFT TOTAL KNEE ARTHROPLASTY;  Surgeon: Shelda Pal, MD;  Location: [facility];  Service: Orthopedics;  Laterality: Left;  . tracheotomy    . TYMPANOPLASTY Left   . VENTRAL HERNIA REPAIR N/A 07/18/2015   Procedure: LAPAROSCOPIC VENTRAL AND UMBILICAL  HERNIA LYSIS OF ADHESIONS;  Surgeon: Ovidio Kin, MD;  Location: [facility];  Service: General;  Laterality: N/A;    OB History    No data available       Home Medications    Prior to Admission medications  Medication Sig Start Date End Date Taking? Authorizing Provider  amLODipine (NORVASC) 10 MG tablet Take 10 mg by mouth every morning.   Yes Historical Provider, MD  atenolol (TENORMIN) 25 MG tablet Take 12.5 mg by mouth every morning.   Yes Historical Provider, MD  benazepril (LOTENSIN) 20 MG tablet Take 20 mg by mouth every morning.   Yes Historical Provider, MD  cholecalciferol (VITAMIN D) 1000 units tablet Take 1,000 Units by mouth daily.   Yes Historical Provider, MD  ferrous sulfate 325 (65 FE) MG EC tablet Take 325 mg by mouth daily with breakfast.   Yes Historical Provider, MD  furosemide (LASIX) 20 MG tablet Take 20 mg by mouth daily.    Yes Historical Provider, MD  LYRICA 100 MG capsule Take 100 mg by mouth daily.  07/05/16  Yes Historical Provider, MD  potassium chloride (KLOR-CON) 8 MEQ tablet Take 8 mEq by mouth daily.  04/17/15  Yes Historical Provider, MD  ranitidine (ZANTAC) 300 MG tablet Take 300 mg by mouth daily.  09/23/16  Yes Historical Provider, MD  traMADol (ULTRAM) 50 MG tablet Take 50 mg by mouth every 6 (six) hours as needed for moderate pain.  07/10/16  Yes Historical Provider, MD    Family History Family History  Problem Relation Age of Onset  . Stroke Mother   . Stroke Father     Social History Social History  Substance Use Topics  . Smoking status: Never Smoker  . Smokeless tobacco: Never Used  . Alcohol use No     Allergies   Codeine and Zofran [ondansetron hcl]   Review of Systems Review of Systems All other systems negative unless otherwise stated in HPI   Physical Exam Updated Vital Signs BP 154/96 (BP Location: Left Arm)   Pulse 60   Temp 97.8 F (36.6 C) (Oral)   Resp 11   SpO2 97%   Physical Exam  Constitutional: She is oriented to person, place, and time. She appears well-developed and well-nourished.  Non-toxic appearance. She does not have a sickly appearance. She does not appear ill.  HENT:  Head: Normocephalic and atraumatic.  Mouth/Throat: Oropharynx is clear and moist.  Eyes: Conjunctivae are normal. Pupils are equal, round, and reactive to light.  Neck: Normal range of motion. Neck supple.  Cardiovascular: Normal rate and regular rhythm.   No unilateral leg swelling.   Pulmonary/Chest: Effort normal and breath sounds normal. No accessory muscle usage or stridor. No respiratory distress. She has no wheezes. She has no rhonchi. She has no rales. She exhibits tenderness.    Abdominal: Soft. Bowel sounds are normal. She exhibits no distension. There is no tenderness. There is no rebound and no guarding.  Musculoskeletal: Normal range of motion.  Lymphadenopathy:     She has no cervical adenopathy.  Neurological: She is alert and oriented to person, place, and time.  Speech clear without dysarthria.  Skin: Skin is warm and dry.  Psychiatric: She has a normal mood and affect. Her behavior is normal.     ED Treatments / Results  Labs (all labs ordered are listed, but only abnormal results are displayed) Labs Reviewed  URINALYSIS, ROUTINE W REFLEX MICROSCOPIC (NOT AT [facility]) - Abnormal; Notable for the following:       Result Value   Leukocytes, UA TRACE (*)    All other components within normal limits  COMPREHENSIVE METABOLIC PANEL - Abnormal; Notable for the following:    ALT 9 (*)    All other components  within normal limits  URINE MICROSCOPIC-ADD ON - Abnormal; Notable for the following:    Squamous Epithelial / LPF 0-5 (*)    All other components within normal limits  LIPASE, BLOOD  CBC WITH DIFFERENTIAL/PLATELET  Rosezena SensorI-STAT TROPOININ, ED  Rosezena SensorI-STAT TROPOININ, ED    EKG  EKG Interpretation  Date/Time:  Tuesday September 24 2016 14:37:35 EDT Ventricular Rate:  58 PR Interval:    QRS Duration: 104 QT Interval:  441 QTC Calculation: 434 R Axis:   20 Text Interpretation:  Sinus rhythm Abnormal R-wave progression, early transition No significant change was found Confirmed by [facility]mc Surgery Center IncCARDAMA MD, PEDRO ([id number]) on 09/24/2016 4:44:20 PM       Radiology Dg Chest 2 View  Result Date: 09/24/2016 CLINICAL DATA:  75 y/o F; intermittent chest pain beginning yesterday. EXAM: CHEST  2 VIEW COMPARISON:  05/04/2016 CT chest and chest radiograph. FINDINGS: Stable cardiac silhouette within normal limits given projection and technique. Uncoiled aorta. Aortic atherosclerosis with arch calcification. Partially visualized anterior cervical fusion hardware. Multilevel degenerative changes of the thoracic spine. No focal consolidation. No pneumothorax or effusion. IMPRESSION: No acute pulmonary process. Electronically Signed   By: Mitzi HansenLance  Furusawa-Stratton M.D.   On:  09/24/2016 16:06    Procedures Procedures (including critical care time)  Medications Ordered in ED Medications  aspirin chewable tablet 324 mg (324 mg Oral Given 09/24/16 1548)  acetaminophen (TYLENOL) tablet 650 mg (650 mg Oral Given 09/24/16 1649)     Initial Impression / Assessment and Plan / ED Course  I have reviewed the triage vital signs and the nursing notes.  Pertinent labs & imaging results that were available during my care of the patient were reviewed by me and considered in my medical decision making (see chart for details).  Clinical Course   Patient presents with chest pain x 1 day with associated cough, SOB, LUQ abdominal pain, and urinary frequency.  RFs include age and HTN.  No hx of DVT/PE or recent immobilization.  VSS, NAD.  On exam, patient appears well, non-toxic or ill.  Heart sounds normal, lungs CTAB, abdomen soft and benign without rebound, guarding, or rigidity.  Chest pain is reproducible with palpation.  EKG without ischemia, acute changes, or changes from previous.  Plan to obtain CXR and labs, and delta troponin.  HEART score 3.  Low suspicion for ACS, atypical presentation and reproducible.  Low risk Well's criteria for PE. Low suspicion for acute infectious or surgical cause for abdominal pain.  CXR and labs without acute abnormalities.  Delta troponin negative.  Will have patient follow up with PCP for stress test in 30 days.  Recommend Tylenol for pain.  Return precautions discussed.  Stable for discharge.  Case has been discussed with and seen by Dr. Eudelia Bunchardama who agrees with the above plan for discharge.  Final Clinical Impressions(s) / ED Diagnoses   Final diagnoses:  Atypical chest pain    New Prescriptions New Prescriptions   No medications on file     Cheri FowlerKayla Shataya Winkles, Cordelia Poche-C 09/24/16 1918    Nira ConnPedro Eduardo Cardama, MD 09/30/16 1455

## 2016-10-30 ENCOUNTER — Ambulatory Visit: Payer: Medicare Other | Admitting: Podiatry

## 2016-12-17 ENCOUNTER — Ambulatory Visit: Payer: Medicare Other | Admitting: Podiatry

## 2017-01-30 ENCOUNTER — Other Ambulatory Visit: Payer: Self-pay | Admitting: Family Medicine

## 2017-01-30 ENCOUNTER — Ambulatory Visit
Admission: RE | Admit: 2017-01-30 | Discharge: 2017-01-30 | Disposition: A | Discharge status: Home / Self Care | Payer: Medicare Other | Source: Ambulatory Visit | Attending: Family Medicine | Admitting: Family Medicine

## 2017-01-30 DIAGNOSIS — J069 Acute upper respiratory infection, unspecified: Secondary | ICD-10-CM

## 2017-05-16 ENCOUNTER — Encounter: Payer: Self-pay | Admitting: Podiatry

## 2017-05-16 ENCOUNTER — Ambulatory Visit (INDEPENDENT_AMBULATORY_CARE_PROVIDER_SITE_OTHER): Payer: Medicare Other | Admitting: Podiatry

## 2017-05-16 DIAGNOSIS — B351 Tinea unguium: Secondary | ICD-10-CM

## 2017-05-16 DIAGNOSIS — M79605 Pain in left leg: Principal | ICD-10-CM

## 2017-05-16 DIAGNOSIS — M79676 Pain in unspecified toe(s): Secondary | ICD-10-CM

## 2017-05-16 DIAGNOSIS — M79604 Pain in right leg: Secondary | ICD-10-CM

## 2017-05-16 NOTE — Progress Notes (Signed)
   Subjective:    Patient ID: Phyllis Knight, female    DOB: 24-Dec-1940, 76 y.o.   MRN: [id number]  HPI Chief Complaint  Patient presents with  . Ingrown Toenail    Rt foot great toenail is painful for about 1 year     Review of Systems  Skin: Positive for color change.       Objective:   Physical Exam        Assessment & Plan:

## 2017-05-16 NOTE — Patient Instructions (Signed)

## 2017-05-19 NOTE — Progress Notes (Signed)
Subjective:    Patient ID: Phyllis Knight, female   DOB: 76 y.o.   MRN: [id number]   HPI patient presents with damage right hallux nail that sore when pressed and thick nailbeds 1-5 bilateral    ROS      Objective:  Physical Exam Neurovascular status intact with damage right hallux nail that's dystrophic and painful when pressed and makes wearing shoe gear difficult with all nails being thick yellow and brittle    Assessment:  Chronic mycotic nail infection 1-5 both feet with pain       Plan:     Debris painful nailbeds 1-5 both feet with no iatrogenic bleeding noted

## 2018-07-16 ENCOUNTER — Observation Stay (HOSPITAL_COMMUNITY)
Admission: EM | Admit: 2018-07-16 | Discharge: 2018-07-17 | Disposition: A | Discharge status: Home / Self Care | Payer: Medicare Other | Attending: Family Medicine | Admitting: Family Medicine

## 2018-07-16 ENCOUNTER — Other Ambulatory Visit: Payer: Self-pay

## 2018-07-16 ENCOUNTER — Emergency Department (HOSPITAL_COMMUNITY): Payer: Medicare Other

## 2018-07-16 ENCOUNTER — Encounter (HOSPITAL_COMMUNITY): Payer: Self-pay | Admitting: Emergency Medicine

## 2018-07-16 DIAGNOSIS — I10 Essential (primary) hypertension: Secondary | ICD-10-CM | POA: Diagnosis present

## 2018-07-16 DIAGNOSIS — Z96652 Presence of left artificial knee joint: Secondary | ICD-10-CM | POA: Diagnosis not present

## 2018-07-16 DIAGNOSIS — M1990 Unspecified osteoarthritis, unspecified site: Secondary | ICD-10-CM | POA: Diagnosis not present

## 2018-07-16 DIAGNOSIS — H9192 Unspecified hearing loss, left ear: Secondary | ICD-10-CM | POA: Insufficient documentation

## 2018-07-16 DIAGNOSIS — Z8542 Personal history of malignant neoplasm of other parts of uterus: Secondary | ICD-10-CM | POA: Diagnosis not present

## 2018-07-16 DIAGNOSIS — Z885 Allergy status to narcotic agent status: Secondary | ICD-10-CM | POA: Insufficient documentation

## 2018-07-16 DIAGNOSIS — Z90722 Acquired absence of ovaries, bilateral: Secondary | ICD-10-CM | POA: Insufficient documentation

## 2018-07-16 DIAGNOSIS — I6523 Occlusion and stenosis of bilateral carotid arteries: Secondary | ICD-10-CM | POA: Insufficient documentation

## 2018-07-16 DIAGNOSIS — S01312A Laceration without foreign body of left ear, initial encounter: Secondary | ICD-10-CM | POA: Insufficient documentation

## 2018-07-16 DIAGNOSIS — Z9889 Other specified postprocedural states: Secondary | ICD-10-CM | POA: Diagnosis not present

## 2018-07-16 DIAGNOSIS — Z7982 Long term (current) use of aspirin: Secondary | ICD-10-CM | POA: Diagnosis not present

## 2018-07-16 DIAGNOSIS — R55 Syncope and collapse: Principal | ICD-10-CM | POA: Diagnosis present

## 2018-07-16 DIAGNOSIS — Z888 Allergy status to other drugs, medicaments and biological substances status: Secondary | ICD-10-CM | POA: Insufficient documentation

## 2018-07-16 DIAGNOSIS — I110 Hypertensive heart disease with heart failure: Secondary | ICD-10-CM | POA: Diagnosis not present

## 2018-07-16 DIAGNOSIS — X58XXXA Exposure to other specified factors, initial encounter: Secondary | ICD-10-CM | POA: Insufficient documentation

## 2018-07-16 DIAGNOSIS — M47812 Spondylosis without myelopathy or radiculopathy, cervical region: Secondary | ICD-10-CM | POA: Diagnosis not present

## 2018-07-16 DIAGNOSIS — Z981 Arthrodesis status: Secondary | ICD-10-CM | POA: Insufficient documentation

## 2018-07-16 DIAGNOSIS — K219 Gastro-esophageal reflux disease without esophagitis: Secondary | ICD-10-CM | POA: Diagnosis not present

## 2018-07-16 DIAGNOSIS — M25562 Pain in left knee: Secondary | ICD-10-CM | POA: Diagnosis present

## 2018-07-16 DIAGNOSIS — E041 Nontoxic single thyroid nodule: Secondary | ICD-10-CM | POA: Diagnosis not present

## 2018-07-16 DIAGNOSIS — E559 Vitamin D deficiency, unspecified: Secondary | ICD-10-CM | POA: Insufficient documentation

## 2018-07-16 DIAGNOSIS — M1711 Unilateral primary osteoarthritis, right knee: Secondary | ICD-10-CM | POA: Diagnosis not present

## 2018-07-16 DIAGNOSIS — I5030 Unspecified diastolic (congestive) heart failure: Secondary | ICD-10-CM | POA: Diagnosis not present

## 2018-07-16 DIAGNOSIS — Z9049 Acquired absence of other specified parts of digestive tract: Secondary | ICD-10-CM | POA: Insufficient documentation

## 2018-07-16 DIAGNOSIS — Z823 Family history of stroke: Secondary | ICD-10-CM | POA: Insufficient documentation

## 2018-07-16 DIAGNOSIS — M797 Fibromyalgia: Secondary | ICD-10-CM | POA: Diagnosis present

## 2018-07-16 DIAGNOSIS — M25462 Effusion, left knee: Secondary | ICD-10-CM | POA: Diagnosis not present

## 2018-07-16 DIAGNOSIS — Z79899 Other long term (current) drug therapy: Secondary | ICD-10-CM | POA: Diagnosis not present

## 2018-07-16 DIAGNOSIS — M25561 Pain in right knee: Secondary | ICD-10-CM | POA: Diagnosis present

## 2018-07-16 HISTORY — DX: Syncope and collapse: R55

## 2018-07-16 NOTE — ED Provider Notes (Signed)
Patient placed in Quick Look pathway, seen and evaluated   Chief Complaint: head injury  HPI:   Phyllis Knight is a 77 y.o. female who presents to the ED with a laceration to the left ear s/p fall. Patient reports she stood up and got dizzy and passed out and hit her head on the wall. Patient went to her PCP and they told her to come to the ED. Patient reports a headache 8/10 and and left knee swelling  ROS: Neuro: headache, LOC, dizziness  Skin: laceration left ear  M/S: left knee pain and swelling  Physical Exam:  BP 140/79 (BP Location: Right Arm)   Pulse 60   Temp 98.2 F (36.8 C) (Oral)   Resp 20   Ht 5\' 6"  (1.676 m)   Wt 95.3 kg   SpO2 100%   BMI 33.89 kg/m    Gen: No distress  Neuro: Awake and Alert  Skin: laceration left ear  M/S: right knee with swelling and tenderness           Left knee with swelling and tenderness.    Initiation of care has begun. The patient has been counseled on the process, plan, and necessity for staying for the completion/evaluation, and the remainder of the medical screening examination    Janne Napoleoneese, Sima Lindenberger M, NP 07/16/18 1759    Lorre NickAllen, Anthony, MD 07/16/18 2235

## 2018-07-16 NOTE — ED Triage Notes (Signed)
Pt blacked out and fell and hit side of head on the wall. Laceration to let ear noted, bleeding controlled. Pt states she has a headache 8/10 since, and left knee swelling. Denies nausea. Pt went to PCP and sent to ER

## 2018-07-16 NOTE — ED Notes (Signed)
Called for triage, no response.

## 2018-07-17 ENCOUNTER — Emergency Department (HOSPITAL_COMMUNITY): Payer: Medicare Other

## 2018-07-17 ENCOUNTER — Observation Stay (HOSPITAL_BASED_OUTPATIENT_CLINIC_OR_DEPARTMENT_OTHER): Payer: Medicare Other

## 2018-07-17 ENCOUNTER — Encounter (HOSPITAL_COMMUNITY): Payer: Self-pay | Admitting: Emergency Medicine

## 2018-07-17 ENCOUNTER — Other Ambulatory Visit: Payer: Self-pay

## 2018-07-17 DIAGNOSIS — M25562 Pain in left knee: Secondary | ICD-10-CM | POA: Diagnosis not present

## 2018-07-17 DIAGNOSIS — M797 Fibromyalgia: Secondary | ICD-10-CM | POA: Diagnosis present

## 2018-07-17 DIAGNOSIS — I10 Essential (primary) hypertension: Secondary | ICD-10-CM | POA: Diagnosis present

## 2018-07-17 DIAGNOSIS — R55 Syncope and collapse: Secondary | ICD-10-CM

## 2018-07-17 DIAGNOSIS — M25561 Pain in right knee: Secondary | ICD-10-CM | POA: Diagnosis present

## 2018-07-17 DIAGNOSIS — I5030 Unspecified diastolic (congestive) heart failure: Secondary | ICD-10-CM

## 2018-07-17 LAB — URINALYSIS, ROUTINE W REFLEX MICROSCOPIC
Bilirubin Urine: NEGATIVE
Glucose, UA: NEGATIVE mg/dL
Ketones, ur: NEGATIVE mg/dL
Nitrite: NEGATIVE
Protein, ur: NEGATIVE mg/dL
Specific Gravity, Urine: 1.016 (ref 1.005–1.030)
pH: 5 (ref 5.0–8.0)

## 2018-07-17 LAB — CBC WITH DIFFERENTIAL/PLATELET
Abs Immature Granulocytes: 0 10*3/uL (ref 0.0–0.1)
Basophils Absolute: 0 10*3/uL (ref 0.0–0.1)
Basophils Relative: 0 %
Eosinophils Absolute: 0.1 10*3/uL (ref 0.0–0.7)
Eosinophils Relative: 2 %
HCT: 40.4 % (ref 36.0–46.0)
Hemoglobin: 13.5 g/dL (ref 12.0–15.0)
Immature Granulocytes: 0 %
Lymphocytes Relative: 29 %
Lymphs Abs: 1.6 10*3/uL (ref 0.7–4.0)
MCH: 31.5 pg (ref 26.0–34.0)
MCHC: 33.4 g/dL (ref 30.0–36.0)
MCV: 94.2 fL (ref 78.0–100.0)
Monocytes Absolute: 0.4 10*3/uL (ref 0.1–1.0)
Monocytes Relative: 8 %
Neutro Abs: 3.4 10*3/uL (ref 1.7–7.7)
Neutrophils Relative %: 61 %
Platelets: 200 10*3/uL (ref 150–400)
RBC: 4.29 MIL/uL (ref 3.87–5.11)
RDW: 11.9 % (ref 11.5–15.5)
WBC: 5.6 10*3/uL (ref 4.0–10.5)

## 2018-07-17 LAB — I-STAT CHEM 8, ED
BUN: 11 mg/dL (ref 8–23)
Calcium, Ion: 1.14 mmol/L — ABNORMAL LOW (ref 1.15–1.40)
Chloride: 108 mmol/L (ref 98–111)
Creatinine, Ser: 0.5 mg/dL (ref 0.44–1.00)
Glucose, Bld: 87 mg/dL (ref 70–99)
HCT: 40 % (ref 36.0–46.0)
Hemoglobin: 13.6 g/dL (ref 12.0–15.0)
Potassium: 3.8 mmol/L (ref 3.5–5.1)
Sodium: 141 mmol/L (ref 135–145)
TCO2: 22 mmol/L (ref 22–32)

## 2018-07-17 LAB — I-STAT TROPONIN, ED: Troponin i, poc: 0 ng/mL (ref 0.00–0.08)

## 2018-07-17 LAB — ECHOCARDIOGRAM COMPLETE
Height: 66 in
Weight: 3312 oz

## 2018-07-17 MED ORDER — POTASSIUM CHLORIDE ER 8 MEQ PO TBCR
8.0000 meq | EXTENDED_RELEASE_TABLET | Freq: Every day | ORAL | Status: DC
  Filled 2018-07-17 (×2): qty 1

## 2018-07-17 MED ORDER — SODIUM CHLORIDE 0.9 % IV SOLN: 250.0000 mL | INTRAVENOUS | Status: DC | PRN

## 2018-07-17 MED ORDER — SODIUM CHLORIDE 0.9% FLUSH: 3.0000 mL | Freq: Two times a day (BID) | INTRAVENOUS | Status: DC

## 2018-07-17 MED ORDER — ATENOLOL 25 MG PO TABS
12.5000 mg | ORAL_TABLET | Freq: Every morning | ORAL | Status: DC
  Administered 2018-07-17: 12.5 mg via ORAL
  Filled 2018-07-17: qty 0.5

## 2018-07-17 MED ORDER — AMLODIPINE BESYLATE 10 MG PO TABS
10.0000 mg | ORAL_TABLET | Freq: Every morning | ORAL | Status: DC
  Administered 2018-07-17: 10 mg via ORAL
  Filled 2018-07-17: qty 1

## 2018-07-17 MED ORDER — PROMETHAZINE HCL 25 MG PO TABS: 12.5000 mg | ORAL_TABLET | Freq: Four times a day (QID) | ORAL | Status: DC | PRN

## 2018-07-17 MED ORDER — VITAMIN D 1000 UNITS PO TABS
2000.0000 [IU] | ORAL_TABLET | Freq: Every day | ORAL | Status: DC
  Administered 2018-07-17: 2000 [IU] via ORAL
  Filled 2018-07-17: qty 2

## 2018-07-17 MED ORDER — SODIUM CHLORIDE 0.9% FLUSH
3.0000 mL | INTRAVENOUS | Status: DC | PRN
Start: 2018-07-17 — End: 2018-07-17
  Administered 2018-07-17: 3 mL via INTRAVENOUS
  Filled 2018-07-17: qty 3

## 2018-07-17 MED ORDER — TRAMADOL HCL 50 MG PO TABS: 50.0000 mg | ORAL_TABLET | Freq: Four times a day (QID) | ORAL | Status: DC | PRN

## 2018-07-17 MED ORDER — SENNOSIDES-DOCUSATE SODIUM 8.6-50 MG PO TABS
1.0000 | ORAL_TABLET | Freq: Every evening | ORAL | Status: DC | PRN
  Administered 2018-07-17: 1 via ORAL
  Filled 2018-07-17: qty 1

## 2018-07-17 MED ORDER — ASPIRIN EC 81 MG PO TBEC
81.0000 mg | DELAYED_RELEASE_TABLET | Freq: Every day | ORAL | Status: DC
  Administered 2018-07-17: 81 mg via ORAL
  Filled 2018-07-17: qty 1

## 2018-07-17 MED ORDER — ACETAMINOPHEN 325 MG PO TABS: 650.0000 mg | ORAL_TABLET | Freq: Four times a day (QID) | ORAL | Status: DC | PRN

## 2018-07-17 MED ORDER — BENAZEPRIL HCL 20 MG PO TABS
20.0000 mg | ORAL_TABLET | Freq: Every morning | ORAL | Status: DC
  Administered 2018-07-17: 20 mg via ORAL
  Filled 2018-07-17: qty 1

## 2018-07-17 MED ORDER — PREGABALIN 100 MG PO CAPS
100.0000 mg | ORAL_CAPSULE | Freq: Every day | ORAL | Status: DC
  Administered 2018-07-17: 100 mg via ORAL
  Filled 2018-07-17: qty 1

## 2018-07-17 MED ORDER — FUROSEMIDE 20 MG PO TABS
20.0000 mg | ORAL_TABLET | Freq: Every day | ORAL | Status: DC
  Administered 2018-07-17: 20 mg via ORAL
  Filled 2018-07-17: qty 1

## 2018-07-17 MED ORDER — PERFLUTREN LIPID MICROSPHERE
1.0000 mL | INTRAVENOUS | Status: AC | PRN
Start: 2018-07-17 — End: 2018-07-17
  Administered 2018-07-17: 2 mL via INTRAVENOUS
  Filled 2018-07-17: qty 10

## 2018-07-17 MED ORDER — FAMOTIDINE 20 MG PO TABS: 40.0000 mg | ORAL_TABLET | Freq: Every day | ORAL | Status: DC

## 2018-07-17 MED ORDER — ACETAMINOPHEN 650 MG RE SUPP: 650.0000 mg | Freq: Four times a day (QID) | RECTAL | Status: DC | PRN

## 2018-07-17 NOTE — ED Notes (Signed)
Cleaned pts wound on ear.

## 2018-07-17 NOTE — Care Management Obs Status (Signed)
MEDICARE OBSERVATION STATUS NOTIFICATION   Patient Details  Name: Phyllis Knight MRN: [id number] Date of Birth: 1941-01-27   Medicare Observation Status Notification Given:       Gala LewandowskyGraves-Bigelow, Timisha Mondry Kaye, RN 07/17/2018, 2:58 PM

## 2018-07-17 NOTE — Discharge Summary (Addendum)
Phyllis Knight, is a 77 y.o. female  DOB June 05, 1941  MRN [id number].  Admission date:  07/16/2018  Admitting Physician  Briscoe Deutscher, MD  Discharge Date:  07/17/2018   Primary MD  Merri Brunette, MD  Recommendations for primary care physician for things to follow:   1)Please medications as prescribed 2) follow up with your Primary doctor in 1 week for recheck And possible suture removal 3) avoid standing up too quickly to avoid excessive dizziness and falls   Admission Diagnosis  Syncope and collapse [R55] Complex laceration of left ear, initial encounter [S01.312A]   Discharge Diagnosis  Syncope and collapse [R55] Complex laceration of left ear, initial encounter [S01.312A]    Principal Problem:   Syncope Active Problems:   Hypertension   Fibromyalgia   Left knee pain      Past Medical History:  Diagnosis Date  . Anemia    pt denies  . Arthritis   . Bradycardia   . Bronchitis    hx of   . Chest pain 06-10-14   had chest pain thia am  . Dizziness   . Fibromyalgia   . 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 GERD (gastroesophageal reflux disease)   . H/O hiatal hernia   . Hearing loss    left ear   . History of uterine cancer 1975   s/p hysterectomy  . Hypertension   . Numbness and tingling    hands and feet bilat   . Pneumonia yrs ago  . Shortness of breath dyspnea   . Syncope 07/16/2018  . Tinnitus   . Urge urinary incontinence   . Urinary incontinence   . Vitamin D deficiency     Past Surgical History:  Procedure Laterality Date  . CERVICAL FUSION  2011  . CHOLECYSTECTOMY N/A 06/14/2014   Procedure: LAPAROSCOPIC CHOLECYSTECTOMY WITH attempted INTRAOPERATIVE CHOLANGIOGRAM;  Surgeon: [name], MD;  Location: [facility];  Service: General;  Laterality: N/A;  . INSERTION OF MESH N/A 07/18/2015   Procedure: INSERTION OF MESH;  Surgeon: Ovidio Kin,  MD;  Location: [facility];  Service: General;  Laterality: N/A;  . KNEE CLOSED REDUCTION Left 09/21/2013   Procedure: CLOSED MANIPULATION LEFT KNEE;  Surgeon: Shelda Pal, MD;  Location: [facility];  Service: Orthopedics;  Laterality: Left;  . TOTAL ABDOMINAL HYSTERECTOMY W/ BILATERAL SALPINGOOPHORECTOMY  1972  . TOTAL KNEE ARTHROPLASTY Left 08/03/2013   Procedure: LEFT TOTAL KNEE ARTHROPLASTY;  Surgeon: Shelda Pal, MD;  Location: [facility];  Service: Orthopedics;  Laterality: Left;  . tracheotomy    . TYMPANOPLASTY Left   . VENTRAL HERNIA REPAIR N/A 07/18/2015   Procedure: LAPAROSCOPIC VENTRAL AND UMBILICAL  HERNIA LYSIS OF ADHESIONS;  Surgeon: Ovidio Kin, MD;  Location: [facility];  Service: General;  Laterality: N/A;       HPI  from the history and physical done on the day of admission:    Patient coming from: Home   Chief Complaint:  Syncope with fall, left ear pain, left knee pain   HPI: Phyllis Knight is a 77 y.o. female with medical history significant for hypertension and fibromyalgia, now presenting to the emergency department following a syncopal episode at home with fall, resulting in left ear and left knee pain.  Patient reports that she has been having dizzy spells, became acutely lightheaded 1 to 2 weeks ago, and had a brief loss of consciousness.  She recovered quickly and there was no injury, and so she did not seek evaluation at that time.  She has continued to have occasional dizzy spells, denies any chest pain or palpitations, and denies fevers or chills.  No headaches or focal numbness or weakness.  She had just stood up yesterday when she became acutely lightheaded again, had a brief loss of consciousness with fall, striking the left side of her head against a wall, and landing on her knees.  She quickly regained consciousness, but was having some pain at the left ear and her knees, particularly on the left.  She has been able to bear weight since that time and  continues to deny chest pain, palpitations, headache, change in vision or hearing, or focal numbness or weakness.  ED Course: Upon arrival to the ED, patient is found to be afebrile, saturating well on room air, and with vitals otherwise stable.  Orthostatic vital signs are negative.  EKG features normal sinus rhythm.  Chest x-ray is negative for acute cardiopulmonary disease.  Noncontrast head CT is negative for acute intracranial abnormality and cervical spine CT is negative for acute fracture or dislocation.  Radiographs of the left knee features a small effusion, but no acute fracture.  Right knee radiographs are negative for acute findings.  Chemistry panel and CBC are unremarkable, troponin is undetectable, and urinalysis not particularly suggestive of infection.  The left ear laceration is being addressed by ED provider, patient remains hemodynamically stable, no arrhythmias on cardiac monitoring in the ED, and she will be observed for ongoing evaluation and management.    Hospital Course:   1)Syncope--- etiology unclear at this time, patient without chest pains or palpitations, orthostatic vitals essentially normal without significant  Difference /changes with positional change,  CT head and CT C-spine without acute findings, EKG without significant findings, on telemetry monitor no significant arrhythmias, echocardiogram with preserved EF over 55%, no significant regional wall motion abnormalities, no significant valvular abnormalities, specifically no significant aortic stenosis, grade 1 diastolic dysfunction noted.  Carotid artery Dopplers without hemodynamically significant stenosis  2)HTN- stable, okay to resume home regimen including benazepril, Norvasc atenolol and Lasix  3) fibromyalgia---  Stable, continue Lyrica and p.r.n. Ultram  4) left knee pain- status post fall, exam and imaging studies unremarkable,  Home PT, patient previously had total knee replacement on the left  side  Discharge Condition: stable,  Physical therapy evaluation appreciated, they recommend home health PT  Follow UP  Follow-up Information    [facility].   Specialty:  Home Health Services Why:  Physical Therapy and Home Health Aide.  Contact information: [address] [place] [address] [phone]            Consults obtained - PT  Diet and Activity recommendation:  As advised  Discharge Instructions     Discharge Instructions    Call MD for:  difficulty breathing, headache or visual disturbances   Complete by:  As directed    Call MD for:  persistant dizziness or  light-headedness   Complete by:  As directed    Call MD for:  persistant nausea and vomiting   Complete by:  As directed    Call MD for:  redness, tenderness, or signs of infection (pain, swelling, redness, odor or green/yellow discharge around incision site)   Complete by:  As directed    Call MD for:  severe uncontrolled pain   Complete by:  As directed    Call MD for:  temperature >100.4   Complete by:  As directed    Diet - low sodium heart healthy   Complete by:  As directed    Discharge instructions   Complete by:  As directed    1)Please medications as prescribed 2) follow up with your Primary doctor in 1 week for recheck And possible suture removal 3) avoid standing up too quickly to avoid excessive dizziness and falls   Increase activity slowly   Complete by:  As directed         Discharge Medications     Allergies as of 07/17/2018      Reactions   Codeine Nausea And Vomiting   Zofran [ondansetron Hcl] Rash      Medication List    TAKE these medications   acetaminophen 500 MG tablet Commonly known as:  TYLENOL Take 500-1,000 mg by mouth every 6 (six) hours as needed for mild pain, moderate pain, fever or headache.   amLODipine 10 MG tablet Commonly known as:  NORVASC Take 10 mg by mouth every morning.   aspirin EC 81 MG  tablet Take 81 mg by mouth daily.   atenolol 25 MG tablet Commonly known as:  TENORMIN Take 12.5 mg by mouth every morning.   benazepril 20 MG tablet Commonly known as:  LOTENSIN Take 20 mg by mouth every morning.   cholecalciferol 1000 units tablet Commonly known as:  VITAMIN D Take 2,000 Units by mouth daily.   ferrous sulfate 325 (65 FE) MG EC tablet Take 325 mg by mouth daily with breakfast.   furosemide 20 MG tablet Commonly known as:  LASIX Take 20 mg by mouth daily.   LYRICA 100 MG capsule Generic drug:  pregabalin Take 100 mg by mouth daily.   potassium chloride 8 MEQ tablet Commonly known as:  KLOR-CON Take 8 mEq by mouth daily.   ranitidine 300 MG tablet Commonly known as:  ZANTAC Take 300 mg by mouth daily.   traMADol 50 MG tablet Commonly known as:  ULTRAM Take 50 mg by mouth every 6 (six) hours as needed for moderate pain.       Major procedures and Radiology Reports - PLEASE review detailed and final reports for all details, in brief -   Dg Chest 2 View  Result Date: 07/17/2018 CLINICAL DATA:  Syncopal episode. EXAM: CHEST - 2 VIEW COMPARISON:  03/07/2017 FINDINGS: Normal heart size and pulmonary vascularity. No focal airspace disease or consolidation in the lungs. No blunting of costophrenic angles. No pneumothorax. Mediastinal contours appear intact. Degenerative changes in the spine and shoulders. Postoperative changes in the cervical spine. Tortuous aorta. IMPRESSION: No evidence of active pulmonary disease. Electronically Signed   By: Burman NievesWilliam  Stevens M.D.   On: 07/17/2018 00:37   Ct Head Wo Contrast  Result Date: 07/16/2018 CLINICAL DATA:  77 y/o F; headache after syncope. Left temporal head injury today. EXAM: CT HEAD WITHOUT CONTRAST CT CERVICAL SPINE WITHOUT CONTRAST TECHNIQUE: Multidetector CT imaging of the head and cervical spine was performed following the standard protocol  without intravenous contrast. Multiplanar CT image reconstructions  of the cervical spine were also generated. COMPARISON:  05/07/2015 CT head FINDINGS: CT HEAD FINDINGS Brain: No evidence of acute infarction, hemorrhage, hydrocephalus, extra-axial collection or mass lesion/mass effect. 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 Mild chronic microvascular ischemic changes and volume loss of the brain. Vascular: Calcific atherosclerosis of the carotid siphons. No hyperdense vessel. Skull: Normal. Negative for fracture or focal lesion. Sinuses/Orbits: Left mastoid opacification. Normal aeration of the paranasal sinuses. Orbits are unremarkable. Other: None. CT CERVICAL SPINE FINDINGS Alignment: Normal. Skull base and vertebrae: C3-C6 anterior cervical discectomy and fusion. Left C3-C6 and right C4-C6 facet fusion. Soft tissues and spinal canal: No prevertebral fluid or swelling. No visible canal hematoma. Disc levels: Cervical spondylosis with advanced disc and facet arthropathy greatest at the C6-7 level. C6-T2 bilateral uncovertebral and facet hypertrophy with foraminal stenosis. No high-grade bony canal stenosis. Upper chest: 16 mm nodule in the right lobe of the thyroid gland. Other: Negative. IMPRESSION: CT head: 1. No acute intracranial abnormality identified. 2. Mild stable chronic microvascular ischemic changes and volume loss of the brain. 3. 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 4. Chronic left mastoid effusion. CT cervical spine: 1. No acute fracture or dislocation. 2. C3-C6 ACDF without apparent hardware related complication. 3. Cervical spondylosis greatest at the C6-7 level. 4. 16 mm nodule in the right lobe of the thyroid. Thyroid ultrasound is recommended on a nonemergent basis. Electronically Signed   By: Mitzi HansenLance  Furusawa-Stratton M.D.   On: 07/16/2018 20:44   Ct Cervical Spine Wo Contrast  Result Date: 07/16/2018 CLINICAL DATA:  77 y/o F; headache after syncope. Left temporal head injury today. EXAM: CT HEAD WITHOUT CONTRAST  CT CERVICAL SPINE WITHOUT CONTRAST TECHNIQUE: Multidetector CT imaging of the head and cervical spine was performed following the standard protocol without intravenous contrast. Multiplanar CT image reconstructions of the cervical spine were also generated. COMPARISON:  05/07/2015 CT head FINDINGS: CT HEAD FINDINGS Brain: No evidence of acute infarction, hemorrhage, hydrocephalus, extra-axial collection or mass lesion/mass effect. 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 Mild chronic microvascular ischemic changes and volume loss of the brain. Vascular: Calcific atherosclerosis of the carotid siphons. No hyperdense vessel. Skull: Normal. Negative for fracture or focal lesion. Sinuses/Orbits: Left mastoid opacification. Normal aeration of the paranasal sinuses. Orbits are unremarkable. Other: None. CT CERVICAL SPINE FINDINGS Alignment: Normal. Skull base and vertebrae: C3-C6 anterior cervical discectomy and fusion. Left C3-C6 and right C4-C6 facet fusion. Soft tissues and spinal canal: No prevertebral fluid or swelling. No visible canal hematoma. Disc levels: Cervical spondylosis with advanced disc and facet arthropathy greatest at the C6-7 level. C6-T2 bilateral uncovertebral and facet hypertrophy with foraminal stenosis. No high-grade bony canal stenosis. Upper chest: 16 mm nodule in the right lobe of the thyroid gland. Other: Negative. IMPRESSION: CT head: 1. No acute intracranial abnormality identified. 2. Mild stable chronic microvascular ischemic changes and volume loss of the brain. 3. 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 4. Chronic left mastoid effusion. CT cervical spine: 1. No acute fracture or dislocation. 2. C3-C6 ACDF without apparent hardware related complication. 3. Cervical spondylosis greatest at the C6-7 level. 4. 16 mm nodule in the right lobe of the thyroid. Thyroid ultrasound is recommended on a nonemergent basis. Electronically Signed   By: Mitzi HansenLance   Furusawa-Stratton M.D.   On: 07/16/2018 20:44   Dg Knee Complete 4 Views Left  Result Date: 07/16/2018  CLINICAL DATA:  Fall, knee pain EXAM: LEFT KNEE - COMPLETE 4+ VIEW COMPARISON:  08/13/2009 FINDINGS: Prior left knee replacement. No hardware bony complicating feature. Small joint effusion. Calcifications noted within the patellar tendon which appear chronic. No fracture, subluxation or dislocation. IMPRESSION: Prior left knee replacement. Small joint effusion. No acute bony abnormality. Electronically Signed   By: Charlett Nose M.D.   On: 07/16/2018 19:12   Dg Knee Complete 4 Views Right  Result Date: 07/16/2018 CLINICAL DATA:  Fall, knee pain EXAM: RIGHT KNEE - COMPLETE 4+ VIEW COMPARISON:  08/13/2009 FINDINGS: Advanced tricompartment degenerative changes within the right knee with joint space narrowing and spurring. No joint effusion. Posterior intra-articular loose bodies. No acute bony abnormality. Specifically, no fracture, subluxation, or dislocation. IMPRESSION: Advanced degenerative changes.  No acute bony abnormality. Electronically Signed   By: Charlett Nose M.D.   On: 07/16/2018 19:10    Micro Results   No results found for this or any previous visit (from the past 240 hour(s)).     Today   Subjective    Phyllis Knight today has no new complaints, No fever  Or chills ,  No Nausea, Vomiting or Diarrhea      Patient has been seen and examined prior to discharge   Objective   Blood pressure 138/71, pulse 61, temperature 98.3 F (36.8 C), temperature source Oral, resp. rate 18, height 5\' 6"  (1.676 m), weight 93.9 kg, SpO2 100 %.   Intake/Output Summary (Last 24 hours) at 07/17/2018 1705 Last data filed at 07/17/2018 1500 Gross per 24 hour  Intake 1320 ml  Output 500 ml  Net 820 ml    Exam Gen:- Awake Alert,  In no apparent distress  HEENT:- [place].AT, No sclera icterus Neck-Supple Neck,No JVD,.  Lungs-  CTAB ,  Good air movement CV- S1, S2 normal, regular Abd-  +ve  B.Sounds, Abd Soft, No tenderness,    Extremity/Skin:-  Good pulses, tenderness with palpation over anterior left knee, no obvious deformity, scar on lt Knee from prior TKR Psych-affect is appropriate, oriented x3 Neuro-no new focal deficits, no tremors   Data Review   CBC w Diff:  Lab Results  Component Value Date   WBC 5.6 07/16/2018   HGB 13.6 07/17/2018   HCT 40.0 07/17/2018   PLT 200 07/16/2018   LYMPHOPCT 29 07/16/2018   MONOPCT 8 07/16/2018   EOSPCT 2 07/16/2018   BASOPCT 0 07/16/2018    CMP:  Lab Results  Component Value Date   NA 141 07/17/2018   K 3.8 07/17/2018   CL 108 07/17/2018   CO2 26 09/24/2016   BUN 11 07/17/2018   CREATININE 0.50 07/17/2018   PROT 7.4 09/24/2016   ALBUMIN 4.0 09/24/2016   BILITOT 0.9 09/24/2016   ALKPHOS 68 09/24/2016   AST 17 09/24/2016   ALT 9 (L) 09/24/2016  .   Total Discharge time is about 33 minutes  Shon Hale M.D on 07/17/2018 at 5:05 PM   Go to [email or web address] - [email or web address] for contact info  [facility] - Office  [phone]

## 2018-07-17 NOTE — Evaluation (Addendum)
Physical Therapy Evaluation Patient Details Name: Phyllis Knight MRN: [id number] DOB: 1941-10-07 Today's Date: 07/17/2018   History of Present Illness  77 yo admitted after syncope with fall with left knee effusion and left ear laceration. PMhx: Lt TKA, HTN, fibromyalgia, arthritis, anemia, hernia  Clinical Impression  PT very pleasant and able to perform hall ambulation without dizziness, spinning or other symptoms of presyncope. Pt able to perform visual tracking, horizontal head turns, vertical head movement as well as transfers and mobility without reproduction of dizziness. Pt with decreased strength, transfers and gait with knee pain who will benefit from acute therapy to maximize mobility and function to decrease falls and increase gait. Pt encouraged to walk with nursing and to shower and dress in sitting at home.     Follow Up Recommendations Home health PT    Equipment Recommendations  None recommended by PT    Recommendations for Other Services       Precautions / Restrictions Precautions Precautions: Fall      Mobility  Bed Mobility Overal bed mobility: Modified Independent             General bed mobility comments: HOB 20 degrees  Transfers Overall transfer level: Needs assistance   Transfers: Sit to/from Stand Sit to Stand: Min guard         General transfer comment: increased time, effort and difficulty to rise from bed and toilet with rail . Cues for sequence  Ambulation/Gait Ambulation/Gait assistance: Supervision Gait Distance (Feet): 200 Feet Assistive device: Straight cane Gait Pattern/deviations: Step-through pattern;Decreased stride length;Antalgic   Gait velocity interpretation: 1.31 - 2.62 ft/sec, indicative of limited community ambulator General Gait Details: pt with decreased stance on left due to knee pain with fatigue limiting gait and reporting she can normally walk further. pt with slow and cautious gait with initially reaching for  environmental support plus cane but denied RW use  Stairs            Wheelchair Mobility    Modified Rankin (Stroke Patients Only)       Balance Overall balance assessment: Needs assistance   Sitting balance-Leahy Scale: Good       Standing balance-Leahy Scale: Fair Standing balance comment: standing at sink without UE suppor, with movement requires UE assist                             Pertinent Vitals/Pain Pain Assessment: 0-10 Pain Score: 4  Pain Location: sore left knee Pain Descriptors / Indicators: Aching;Sore Pain Intervention(s): Limited activity within patient's tolerance    Home Living Family/patient expects to be discharged to:: Private residence Living Arrangements: Spouse/significant other Available Help at Discharge: Family Type of Home: House Home Access: Level entry     Home Layout: One level Home Equipment: Cane - single point;Walker - 2 wheels;Bedside commode;Toilet riser;Shower seat      Prior Function Level of Independence: Independent with assistive device(s)         Comments: uses SPC for ambulation, BSC at night     Hand Dominance        Extremity/Trunk Assessment   Upper Extremity Assessment Upper Extremity Assessment: Generalized weakness    Lower Extremity Assessment Lower Extremity Assessment: Generalized weakness    Cervical / Trunk Assessment Cervical / Trunk Assessment: Kyphotic  Communication   Communication: No difficulties  Cognition Arousal/Alertness: Awake/alert Behavior During Therapy: WFL for tasks assessed/performed Overall Cognitive Status: Within Functional Limits for tasks assessed  General Comments      Exercises     Assessment/Plan    PT Assessment Patient needs continued PT services  PT Problem List Decreased strength;Decreased mobility;Decreased activity tolerance;Decreased balance;Decreased knowledge of use of  DME;Pain       PT Treatment Interventions Gait training;Therapeutic exercise;Patient/family education;Functional mobility training;Balance training;DME instruction;Therapeutic activities    PT Goals (Current goals can be found in the Care Plan section)  Acute Rehab PT Goals Patient Stated Goal: return to walking more PT Goal Formulation: With patient Time For Goal Achievement: 07/31/18 Potential to Achieve Goals: Good    Frequency Min 3X/week   Barriers to discharge        Co-evaluation               AM-PAC PT "6 Clicks" Daily Activity  Outcome Measure Difficulty turning over in bed (including adjusting bedclothes, sheets and blankets)?: None Difficulty moving from lying on back to sitting on the side of the bed? : A Little Difficulty sitting down on and standing up from a chair with arms (e.g., wheelchair, bedside commode, etc,.)?: A Lot Help needed moving to and from a bed to chair (including a wheelchair)?: A Little Help needed walking in hospital room?: A Little Help needed climbing 3-5 steps with a railing? : A Little 6 Click Score: 18    End of Session Equipment Utilized During Treatment: Gait belt Activity Tolerance: Patient tolerated treatment well Patient left: in chair;with call bell/phone within reach Nurse Communication: Mobility status PT Visit Diagnosis: Other abnormalities of gait and mobility (R26.89);Muscle weakness (generalized) (M62.81)    Time: [id number] PT Time Calculation (min) (ACUTE ONLY): 21 min   Charges:   PT Evaluation $PT Eval Moderate Complexity: 1 [address]Mod          Ahmoni Edge Tabor Debar Plate, PT [phone]   Cristine PolioMaija B Mahi Zabriskie 07/17/2018, 8:35 AM

## 2018-07-17 NOTE — Care Management Note (Signed)
Case Management Note  Patient Details  Name: Pearson ForsterMattie L Tonnesen MRN: [id number] Date of Birth: 1941-07-31  Subjective/Objective: Pt presented for Syncope. PTA from home with the support of husband. Son is at the bedside at the time of conversation. Pt has DME cane at home. [facility] pt is agreeable to services. CM did discuss HH Aide as well.                    Action/Plan: [facility] list [facility] within 24-48 hours post transition home. Pt [facility] and [facility] once stable. No further needs at this time.   Expected Discharge Date:                  Expected Discharge Plan:     In-House Referral:  NA  Discharge planning Services  CM Consult  Post Acute Care Choice:  Home Health Choice offered to:  Patient  DME Arranged:  N/A DME Agency:  NA  HH Arranged:  PT, Nurse's Aide HH Agency:  [facility]  Status of Service:  Completed, signed off  If discussed at Long Length of Stay Meetings, dates discussed:    Additional Comments:  Gala LewandowskyGraves-Bigelow, Nehan Flaum Kaye, RN 07/17/2018, 3:06 PM

## 2018-07-17 NOTE — Progress Notes (Signed)
Patient is alert orient Iv Removed and paper work given, Discharged with Home health.

## 2018-07-17 NOTE — H&P (Signed)
History and Physical    Phyllis Knight ZOX:[id number] DOB: February 17, 1941 DOA: 07/16/2018  PCP: Merri Brunette, MD   Patient coming from: Home   Chief Complaint: Syncope with fall, left ear pain, left knee pain   HPI: Phyllis Knight is a 77 y.o. female with medical history significant for hypertension and fibromyalgia, now presenting to the emergency department following a syncopal episode at home with fall, resulting in left ear and left knee pain.  Patient reports that she has been having dizzy spells, became acutely lightheaded 1 to 2 weeks ago, and had a brief loss of consciousness.  She recovered quickly and there was no injury, and so she did not seek evaluation at that time.  She has continued to have occasional dizzy spells, denies any chest pain or palpitations, and denies fevers or chills.  No headaches or focal numbness or weakness.  She had just stood up yesterday when she became acutely lightheaded again, had a brief loss of consciousness with fall, striking the left side of her head against a wall, and landing on her knees.  She quickly regained consciousness, but was having some pain at the left ear and her knees, particularly on the left.  She has been able to bear weight since that time and continues to deny chest pain, palpitations, headache, change in vision or hearing, or focal numbness or weakness.  ED Course: Upon arrival to the ED, patient is found to be afebrile, saturating well on room air, and with vitals otherwise stable.  Orthostatic vital signs are negative.  EKG features normal sinus rhythm.  Chest x-ray is negative for acute cardiopulmonary disease.  Noncontrast head CT is negative for acute intracranial abnormality and cervical spine CT is negative for acute fracture or dislocation.  Radiographs of the left knee features a small effusion, but no acute fracture.  Right knee radiographs are negative for acute findings.  Chemistry panel and CBC are unremarkable, troponin is  undetectable, and urinalysis not particularly suggestive of infection.  The left ear laceration is being addressed by ED provider, patient remains hemodynamically stable, no arrhythmias on cardiac monitoring in the ED, and she will be observed for ongoing evaluation and management.  Review of Systems:  All other systems reviewed and apart from HPI, are negative.  Past Medical History:  Diagnosis Date  . Anemia    pt denies  . Arthritis   . Bradycardia   . Bronchitis    hx of   . Chest pain 06-10-14   had chest pain thia am  . Dizziness   . Fibromyalgia   . 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 GERD (gastroesophageal reflux disease)   . H/O hiatal hernia   . Hearing loss    left ear   . History of uterine cancer 1975   s/p hysterectomy  . Hypertension   . Numbness and tingling    hands and feet bilat   . Pneumonia yrs ago  . Shortness of breath dyspnea   . Tinnitus   . Urge urinary incontinence   . Urinary incontinence   . Vitamin D deficiency     Past Surgical History:  Procedure Laterality Date  . CERVICAL FUSION  2011  . CHOLECYSTECTOMY N/A 06/14/2014   Procedure: LAPAROSCOPIC CHOLECYSTECTOMY WITH attempted INTRAOPERATIVE CHOLANGIOGRAM;  Surgeon: [name], MD;  Location: [facility];  Service: General;  Laterality: N/A;  . INSERTION OF MESH N/A 07/18/2015   Procedure: INSERTION OF  MESH;  Surgeon: Ovidio Kin, MD;  Location: [facility];  Service: General;  Laterality: N/A;  . KNEE CLOSED REDUCTION Left 09/21/2013   Procedure: CLOSED MANIPULATION LEFT KNEE;  Surgeon: Shelda Pal, MD;  Location: [facility];  Service: Orthopedics;  Laterality: Left;  . TOTAL ABDOMINAL HYSTERECTOMY W/ BILATERAL SALPINGOOPHORECTOMY  1972  . TOTAL KNEE ARTHROPLASTY Left 08/03/2013   Procedure: LEFT TOTAL KNEE ARTHROPLASTY;  Surgeon: Shelda Pal, MD;  Location: [facility];  Service: Orthopedics;  Laterality: Left;  . tracheotomy    . TYMPANOPLASTY Left   .  VENTRAL HERNIA REPAIR N/A 07/18/2015   Procedure: LAPAROSCOPIC VENTRAL AND UMBILICAL  HERNIA LYSIS OF ADHESIONS;  Surgeon: Ovidio Kin, MD;  Location: [facility];  Service: General;  Laterality: N/A;     reports that she has never smoked. She has never used smokeless tobacco. She reports that she does not drink alcohol or use drugs.  Allergies  Allergen Reactions  . Codeine Nausea And Vomiting  . Zofran [Ondansetron Hcl] Rash    Family History  Problem Relation Age of Onset  . Stroke Mother   . Stroke Father      Prior to Admission medications   Medication Sig Start Date End Date Taking? Authorizing Provider  acetaminophen (TYLENOL) 500 MG tablet Take 500-1,000 mg by mouth every 6 (six) hours as needed for mild pain, moderate pain, fever or headache.   Yes [provider]  amLODipine (NORVASC) 10 MG tablet Take 10 mg by mouth every morning.   Yes [provider]  aspirin EC 81 MG tablet Take 81 mg by mouth daily.   Yes [provider]  atenolol (TENORMIN) 25 MG tablet Take 12.5 mg by mouth every morning.   Yes [provider]  benazepril (LOTENSIN) 20 MG tablet Take 20 mg by mouth every morning.   Yes [provider]  cholecalciferol (VITAMIN D) 1000 units tablet Take 2,000 Units by mouth daily.    Yes [provider]  furosemide (LASIX) 20 MG tablet Take 20 mg by mouth daily.   Yes [provider]  LYRICA 100 MG capsule Take 100 mg by mouth daily.  07/05/16  Yes [provider]  potassium chloride (KLOR-CON) 8 MEQ tablet Take 8 mEq by mouth daily.  04/17/15  Yes [provider]  ranitidine (ZANTAC) 300 MG tablet Take 300 mg by mouth daily.  09/23/16  Yes [provider]  traMADol (ULTRAM) 50 MG tablet Take 50 mg by mouth every 6 (six) hours as needed for moderate pain.  07/10/16  Yes [provider]  ferrous sulfate 325 (65 FE) MG EC tablet Take 325 mg by mouth daily with breakfast.    [provider]    Physical Exam: Vitals:   07/16/18 2047 07/16/18 2238 07/17/18 0015 07/17/18 0030  BP: 129/82 (!) 156/107 (!) 176/98 (!) 171/84  Pulse: 60 63 62 62  Resp: 16 16 15 14   Temp:      TempSrc:      SpO2: 100% 99% 98% 99%  Weight:      Height:          Constitutional: NAD, calm, obese Eyes: PERTLA, lids normal, pterygium on right  ENMT: Mucous membranes are moist. Posterior pharynx clear of any exudate or lesions. Left ear laceration   Neck: normal, supple, no masses, no thyromegaly Respiratory: clear to auscultation bilaterally, no wheezing, no crackles. Normal respiratory effort.    Cardiovascular: S1 & S2 heard, regular rate  and rhythm. No significant pitting edema to LE's. Abdomen: No distension, no tenderness, soft. Bowel sounds active.  Musculoskeletal: no clubbing / cyanosis. Anterior left knee tenderness, ROM intact, joint stable to varus/valgus stressing, neg Lockman.    Skin: No rash. Warm, dry, well-perfused. Neurologic: CN 2-12 grossly intact. Sensation intact. Strength 5/5 in all 4 limbs.  Psychiatric: Alert and oriented to person, place, and situation. Calm, cooperative.     Labs on Admission: I have personally reviewed following labs and imaging studies  CBC: Recent Labs  Lab 07/16/18 2352 07/17/18 0017  WBC 5.6  --   NEUTROABS 3.4  --   HGB 13.5 13.6  HCT 40.4 40.0  MCV 94.2  --   PLT 200  --    Basic Metabolic Panel: Recent Labs  Lab 07/17/18 0017  NA 141  K 3.8  CL 108  GLUCOSE 87  BUN 11  CREATININE 0.50   GFR: Estimated Creatinine Clearance: 68.5 mL/min (by C-G formula based on SCr of 0.5 mg/dL). Liver Function Tests: No results for input(s): AST, ALT, ALKPHOS, BILITOT, PROT, ALBUMIN in the last 168 hours. No results for input(s): LIPASE, AMYLASE in the last 168 hours. No results for input(s): AMMONIA in the last 168 hours. Coagulation Profile: No results for input(s): INR, PROTIME in the last 168 hours. Cardiac  Enzymes: No results for input(s): CKTOTAL, CKMB, CKMBINDEX, TROPONINI in the last 168 hours. BNP (last 3 results) No results for input(s): PROBNP in the last 8760 hours. HbA1C: No results for input(s): HGBA1C in the last 72 hours. CBG: No results for input(s): GLUCAP in the last 168 hours. Lipid Profile: No results for input(s): CHOL, HDL, LDLCALC, TRIG, CHOLHDL, LDLDIRECT in the last 72 hours. Thyroid Function Tests: No results for input(s): TSH, T4TOTAL, FREET4, T3FREE, THYROIDAB in the last 72 hours. Anemia Panel: No results for input(s): VITAMINB12, FOLATE, FERRITIN, TIBC, IRON, RETICCTPCT in the last 72 hours. Urine analysis:    Component Value Date/Time   COLORURINE YELLOW 07/16/2018 2353   APPEARANCEUR CLOUDY (A) 07/16/2018 2353   LABSPEC 1.016 07/16/2018 2353   PHURINE 5.0 07/16/2018 2353   GLUCOSEU NEGATIVE 07/16/2018 2353   HGBUR SMALL (A) 07/16/2018 2353   BILIRUBINUR NEGATIVE 07/16/2018 2353   KETONESUR NEGATIVE 07/16/2018 2353   PROTEINUR NEGATIVE 07/16/2018 2353   UROBILINOGEN 1.0 05/19/2014 2348   NITRITE NEGATIVE 07/16/2018 2353   LEUKOCYTESUR TRACE (A) 07/16/2018 2353   Sepsis Labs: @LABRCNTIP (procalcitonin:4,lacticidven:4) )No results found for this or any previous visit (from the past 240 hour(s)).   Radiological Exams on Admission: Dg Chest 2 View  Result Date: 07/17/2018 CLINICAL DATA:  Syncopal episode. EXAM: CHEST - 2 VIEW COMPARISON:  03/07/2017 FINDINGS: Normal heart size and pulmonary vascularity. No focal airspace disease or consolidation in the lungs. No blunting of costophrenic angles. No pneumothorax. Mediastinal contours appear intact. Degenerative changes in the spine and shoulders. Postoperative changes in the cervical spine. Tortuous aorta. IMPRESSION: No evidence of active pulmonary disease. Electronically Signed   By: Burman NievesWilliam  Stevens M.D.   On: 07/17/2018 00:37   Ct Head Wo Contrast  Result Date: 07/16/2018 CLINICAL DATA:  77 y/o F;  headache after syncope. Left temporal head injury today. EXAM: CT HEAD WITHOUT CONTRAST CT CERVICAL SPINE WITHOUT CONTRAST TECHNIQUE: Multidetector CT imaging of the head and cervical spine was performed following the standard protocol without intravenous contrast. Multiplanar CT image reconstructions of the cervical spine were also generated. COMPARISON:  05/07/2015 CT head FINDINGS: CT HEAD FINDINGS Brain: No evidence of acute  infarction, hemorrhage, hydrocephalus, extra-axial collection or mass lesion/mass effect. 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 Mild chronic microvascular ischemic changes and volume loss of the brain. Vascular: Calcific atherosclerosis of the carotid siphons. No hyperdense vessel. Skull: Normal. Negative for fracture or focal lesion. Sinuses/Orbits: Left mastoid opacification. Normal aeration of the paranasal sinuses. Orbits are unremarkable. Other: None. CT CERVICAL SPINE FINDINGS Alignment: Normal. Skull base and vertebrae: C3-C6 anterior cervical discectomy and fusion. Left C3-C6 and right C4-C6 facet fusion. Soft tissues and spinal canal: No prevertebral fluid or swelling. No visible canal hematoma. Disc levels: Cervical spondylosis with advanced disc and facet arthropathy greatest at the C6-7 level. C6-T2 bilateral uncovertebral and facet hypertrophy with foraminal stenosis. No high-grade bony canal stenosis. Upper chest: 16 mm nodule in the right lobe of the thyroid gland. Other: Negative. IMPRESSION: CT head: 1. No acute intracranial abnormality identified. 2. Mild stable chronic microvascular ischemic changes and volume loss of the brain. 3. 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 4. Chronic left mastoid effusion. CT cervical spine: 1. No acute fracture or dislocation. 2. C3-C6 ACDF without apparent hardware related complication. 3. Cervical spondylosis greatest at the C6-7 level. 4. 16 mm nodule in the right lobe of the thyroid. Thyroid  ultrasound is recommended on a nonemergent basis. Electronically Signed   By: Mitzi HansenLance  Furusawa-Stratton M.D.   On: 07/16/2018 20:44   Ct Cervical Spine Wo Contrast  Result Date: 07/16/2018 CLINICAL DATA:  77 y/o F; headache after syncope. Left temporal head injury today. EXAM: CT HEAD WITHOUT CONTRAST CT CERVICAL SPINE WITHOUT CONTRAST TECHNIQUE: Multidetector CT imaging of the head and cervical spine was performed following the standard protocol without intravenous contrast. Multiplanar CT image reconstructions of the cervical spine were also generated. COMPARISON:  05/07/2015 CT head FINDINGS: CT HEAD FINDINGS Brain: No evidence of acute infarction, hemorrhage, hydrocephalus, extra-axial collection or mass lesion/mass effect. 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 Mild chronic microvascular ischemic changes and volume loss of the brain. Vascular: Calcific atherosclerosis of the carotid siphons. No hyperdense vessel. Skull: Normal. Negative for fracture or focal lesion. Sinuses/Orbits: Left mastoid opacification. Normal aeration of the paranasal sinuses. Orbits are unremarkable. Other: None. CT CERVICAL SPINE FINDINGS Alignment: Normal. Skull base and vertebrae: C3-C6 anterior cervical discectomy and fusion. Left C3-C6 and right C4-C6 facet fusion. Soft tissues and spinal canal: No prevertebral fluid or swelling. No visible canal hematoma. Disc levels: Cervical spondylosis with advanced disc and facet arthropathy greatest at the C6-7 level. C6-T2 bilateral uncovertebral and facet hypertrophy with foraminal stenosis. No high-grade bony canal stenosis. Upper chest: 16 mm nodule in the right lobe of the thyroid gland. Other: Negative. IMPRESSION: CT head: 1. No acute intracranial abnormality identified. 2. Mild stable chronic microvascular ischemic changes and volume loss of the brain. 3. 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 4. Chronic left mastoid effusion. CT cervical  spine: 1. No acute fracture or dislocation. 2. C3-C6 ACDF without apparent hardware related complication. 3. Cervical spondylosis greatest at the C6-7 level. 4. 16 mm nodule in the right lobe of the thyroid. Thyroid ultrasound is recommended on a nonemergent basis. Electronically Signed   By: Mitzi HansenLance  Furusawa-Stratton M.D.   On: 07/16/2018 20:44   Dg Knee Complete 4 Views Left  Result Date: 07/16/2018 CLINICAL DATA:  Fall, knee pain EXAM: LEFT KNEE - COMPLETE 4+ VIEW COMPARISON:  08/13/2009 FINDINGS: Prior left knee replacement. No hardware bony complicating feature. Small joint effusion.  Calcifications noted within the patellar tendon which appear chronic. No fracture, subluxation or dislocation. IMPRESSION: Prior left knee replacement. Small joint effusion. No acute bony abnormality. Electronically Signed   By: Charlett Nose M.D.   On: 07/16/2018 19:12   Dg Knee Complete 4 Views Right  Result Date: 07/16/2018 CLINICAL DATA:  Fall, knee pain EXAM: RIGHT KNEE - COMPLETE 4+ VIEW COMPARISON:  08/13/2009 FINDINGS: Advanced tricompartment degenerative changes within the right knee with joint space narrowing and spurring. No joint effusion. Posterior intra-articular loose bodies. No acute bony abnormality. Specifically, no fracture, subluxation, or dislocation. IMPRESSION: Advanced degenerative changes.  No acute bony abnormality. Electronically Signed   By: Charlett Nose M.D.   On: 07/16/2018 19:10    EKG: Independently reviewed. Normal sinus rhythm.   Assessment/Plan   1. Syncope  - Presents following a syncopal episode with fall, reports hitting her head on a wall; she reports another syncopal episode 1-2 wks ago for which she was not evaluated  - Denies chest pain or palpitations, reports that she had just stood up, was acutely lightheaded just prior to the episode  - Orthostatic vitals negative in ED; head CT and c-spine CT negative; radiographs of knees negative  - EKG with normal sinus rhythm  -  Continue cardiac monitoring, check echocardiogram   2. Hypertension  - BP mildly elevated in ED  - Continue Norvasc, atenolol, benazepril, and Lasix    3. Fibromyalgia  - Continue home regimen with Lyrica, prn Ultram    4. Left knee pain  - Patient reports chronic knee pain, is status-post TKA, and reports increased pain since her syncopal episode  - She has been able to bear weight and radiographs are negative  - Continue supportive care, consult PT for eval and tx    DVT prophylaxis: SCD's Code Status: Full  Family Communication: Husband updated at bedside Consults called: None Admission status: Observation     Briscoe Deutscher, MD [facility] Pager [phone]  If 7PM-7AM, please contact night-coverage [email or web address] Password [email or web address]  07/17/2018, 3:10 AM

## 2018-07-17 NOTE — Discharge Instructions (Signed)
1)Please medications as prescribed 2) follow up with your Primary doctor in 1 week for recheck And possible suture removal 3) avoid standing up too quickly to avoid excessive dizziness and falls

## 2018-07-17 NOTE — Progress Notes (Signed)
  Echocardiogram 2D Echocardiogram has been performed with Definity.  Gerda Dissrthur L Lulani Bour 07/17/2018, 2:33 PM

## 2018-07-17 NOTE — Progress Notes (Signed)
Carotid artery duplex has been completed. 1-39% ICA stenosis bilaterally.  07/17/18 12:19 PM Olen CordialGreg Kindle Strohmeier RVT

## 2018-07-17 NOTE — ED Provider Notes (Signed)
[facility] EMERGENCY DEPARTMENT Provider Note   CSN: [id number] Arrival date & time: 07/16/18  1723     History   Chief Complaint Chief Complaint  Patient presents with  . Head Injury    HPI Phyllis Knight is a 77 y.o. female.  The history is provided by the patient.  Head Injury   The incident occurred 12 to 24 hours ago. She came to the ER via walk-in. The injury mechanism was a fall. Length of episode of loss of consciousness: unknown x 2  The volume of blood lost was minimal. The quality of the pain is described as dull. The pain is at a severity of 6/10. The pain is moderate. The pain has been constant since the injury. Associated symptoms include weakness. Pertinent negatives include no numbness, no blurred vision, no vomiting, no tinnitus, no disorientation and no memory loss. Found by EMS: none. Treatment prior to arrival: none. She has tried nothing for the symptoms. The treatment provided significant relief.  Loss of Consciousness   This is a new problem. The current episode started 12 to 24 hours ago. The problem occurs constantly. The problem has been resolved. Length of episode of loss of consciousness: unknown. The problem is associated with normal activity. Associated symptoms include light-headedness and weakness. Pertinent negatives include abdominal pain, back pain, bladder incontinence, bowel incontinence, chest pain, clumsiness, confusion, dizziness, fever, focal sensory loss, focal weakness, nausea, palpitations, seizures, slurred speech, vertigo and vomiting.  Patient had syncope this am and reportedly had same 2 weeks ago.  Went to [facility] walk in who sent her here.  Has a devitalized laceration to the ear.    Past Medical History:  Diagnosis Date  . Anemia    pt denies  . Arthritis   . Bradycardia   . Bronchitis    hx of   . Chest pain 06-10-14   had chest pain thia am  . Dizziness   . Fibromyalgia   . 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 GERD (gastroesophageal reflux disease)   . H/O hiatal hernia   . Hearing loss    left ear   . History of uterine cancer 1975   s/p hysterectomy  . Hypertension   . Numbness and tingling    hands and feet bilat   . Pneumonia yrs ago  . Shortness of breath dyspnea   . Tinnitus   . Urge urinary incontinence   . Urinary incontinence   . Vitamin D deficiency     Patient Active Problem List   Diagnosis Date Noted  . 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 Expected blood loss anemia 08/04/2013  . Obese 08/04/2013  . 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 CERVICAL FUSION  2011  . CHOLECYSTECTOMY N/A 06/14/2014   Procedure: LAPAROSCOPIC CHOLECYSTECTOMY WITH attempted INTRAOPERATIVE CHOLANGIOGRAM;  Surgeon: [name], MD;  Location: [facility];  Service: General;  Laterality: N/A;  . INSERTION OF MESH N/A 07/18/2015   Procedure: INSERTION OF MESH;  Surgeon: Ovidio Kin, MD;  Location: [facility];  Service: General;  Laterality: N/A;  . KNEE CLOSED REDUCTION Left 09/21/2013   Procedure: CLOSED MANIPULATION LEFT KNEE;  Surgeon: Shelda Pal, MD;  Location: [facility];  Service: Orthopedics;  Laterality: Left;  . TOTAL ABDOMINAL HYSTERECTOMY W/ BILATERAL SALPINGOOPHORECTOMY  1972  . TOTAL KNEE ARTHROPLASTY Left 08/03/2013   Procedure: LEFT  TOTAL KNEE ARTHROPLASTY;  Surgeon: Shelda PalMatthew D Olin, MD;  Location: [facility];  Service: Orthopedics;  Laterality: Left;  . tracheotomy    . TYMPANOPLASTY Left   . VENTRAL HERNIA REPAIR N/A 07/18/2015   Procedure: LAPAROSCOPIC VENTRAL AND UMBILICAL  HERNIA LYSIS OF ADHESIONS;  Surgeon: Ovidio Kinavid Newman, MD;  Location: [facility];  Service: General;  Laterality: N/A;     OB History   None      Home Medications    Prior to Admission medications   Medication Sig Start Date End Date Taking? Authorizing Provider  acetaminophen (TYLENOL) 500 MG tablet Take 500-1,000 mg by mouth every 6  (six) hours as needed for mild pain, moderate pain, fever or headache.   Yes [provider]  amLODipine (NORVASC) 10 MG tablet Take 10 mg by mouth every morning.   Yes [provider]  aspirin EC 81 MG tablet Take 81 mg by mouth daily.   Yes [provider]  atenolol (TENORMIN) 25 MG tablet Take 12.5 mg by mouth every morning.   Yes [provider]  benazepril (LOTENSIN) 20 MG tablet Take 20 mg by mouth every morning.   Yes [provider]  cholecalciferol (VITAMIN D) 1000 units tablet Take 2,000 Units by mouth daily.    Yes [provider]  furosemide (LASIX) 20 MG tablet Take 20 mg by mouth daily.   Yes [provider]  LYRICA 100 MG capsule Take 100 mg by mouth daily.  07/05/16  Yes [provider]  potassium chloride (KLOR-CON) 8 MEQ tablet Take 8 mEq by mouth daily.  04/17/15  Yes [provider]  ranitidine (ZANTAC) 300 MG tablet Take 300 mg by mouth daily.  09/23/16  Yes [provider]  traMADol (ULTRAM) 50 MG tablet Take 50 mg by mouth every 6 (six) hours as needed for moderate pain.  07/10/16  Yes [provider]  ferrous sulfate 325 (65 FE) MG EC tablet Take 325 mg by mouth daily with breakfast.    [provider]    Family History Family History  Problem Relation Age of Onset  . Stroke Mother   . Stroke Father     Social History Social History   Tobacco Use  . Smoking status: Never Smoker  . Smokeless tobacco: Never Used  Substance Use Topics  . Alcohol use: No  . Drug use: No     Allergies   Codeine and Zofran [ondansetron hcl]   Review of Systems Review of Systems  Constitutional: Negative for fever.  HENT: Negative for tinnitus.   Eyes: Negative for blurred vision.  Respiratory: Negative for chest tightness.   Cardiovascular: Positive for syncope. Negative for chest pain, palpitations and leg swelling.  Gastrointestinal: Negative for abdominal pain, bowel  incontinence, diarrhea, nausea and vomiting.  Genitourinary: Negative for bladder incontinence and flank pain.  Musculoskeletal: Negative for back pain.  Skin: Positive for wound.  Neurological: Positive for weakness and light-headedness. Negative for dizziness, vertigo, focal weakness, seizures, facial asymmetry, speech difficulty and numbness.  Hematological: Negative for adenopathy.  Psychiatric/Behavioral: Negative for confusion and memory loss.  All other systems reviewed and are negative.    Physical Exam Updated Vital Signs BP (!) 171/84   Pulse 62   Temp 98.8 F (37.1 C) (Oral)   Resp 14   Ht 5\' 6"  (1.676 m)   Wt 95.3 kg   SpO2 99%   BMI 33.89 kg/m   Physical Exam  Constitutional: She is oriented to person, place,  and time. She appears well-developed and well-nourished. No distress.  HENT:  Head: Normocephalic and atraumatic.    Nose: Nose normal.  Mouth/Throat: No oropharyngeal exudate.  Eyes: Pupils are equal, round, and reactive to light. Conjunctivae and EOM are normal.  Neck: Normal range of motion. Neck supple.  Cardiovascular: Normal rate, regular rhythm, normal heart sounds and intact distal pulses.  Pulmonary/Chest: Effort normal and breath sounds normal. No stridor. No respiratory distress. She has no wheezes.  Abdominal: Soft. Bowel sounds are normal. She exhibits no mass. There is no tenderness. There is no rebound and no guarding.  Musculoskeletal: Normal range of motion. She exhibits no edema, tenderness or deformity.  Neurological: She is alert and oriented to person, place, and time. She displays normal reflexes. No cranial nerve deficit. She exhibits normal muscle tone. Coordination normal.  Skin: Skin is warm and dry. Capillary refill takes less than 2 seconds.  Psychiatric: She has a normal mood and affect.  Nursing note and vitals reviewed.    ED Treatments / Results  Labs (all labs ordered are listed, but only abnormal results are  displayed) Results for orders placed or performed during the hospital encounter of 07/16/18  CBC with Differential/Platelet  Result Value Ref Range   WBC 5.6 4.0 - 10.5 K/uL   RBC 4.29 3.87 - 5.11 MIL/uL   Hemoglobin 13.5 12.0 - 15.0 g/dL   HCT 16.1 09.6 - 04.5 %   MCV 94.2 78.0 - 100.0 fL   MCH 31.5 26.0 - 34.0 pg   MCHC 33.4 30.0 - 36.0 g/dL   RDW 40.9 81.1 - 91.4 %   Platelets 200 150 - 400 K/uL   Neutrophils Relative % 61 %   Neutro Abs 3.4 1.7 - 7.7 K/uL   Lymphocytes Relative 29 %   Lymphs Abs 1.6 0.7 - 4.0 K/uL   Monocytes Relative 8 %   Monocytes Absolute 0.4 0.1 - 1.0 K/uL   Eosinophils Relative 2 %   Eosinophils Absolute 0.1 0.0 - 0.7 K/uL   Basophils Relative 0 %   Basophils Absolute 0.0 0.0 - 0.1 K/uL   Immature Granulocytes 0 %   Abs Immature Granulocytes 0.0 0.0 - 0.1 K/uL  Urinalysis, Routine w reflex microscopic  Result Value Ref Range   Color, Urine YELLOW YELLOW   APPearance CLOUDY (A) CLEAR   Specific Gravity, Urine 1.016 1.005 - 1.030   pH 5.0 5.0 - 8.0   Glucose, UA NEGATIVE NEGATIVE mg/dL   Hgb urine dipstick SMALL (A) NEGATIVE   Bilirubin Urine NEGATIVE NEGATIVE   Ketones, ur NEGATIVE NEGATIVE mg/dL   Protein, ur NEGATIVE NEGATIVE mg/dL   Nitrite NEGATIVE NEGATIVE   Leukocytes, UA TRACE (A) NEGATIVE   RBC / HPF 0-5 0 - 5 RBC/hpf   WBC, UA 0-5 0 - 5 WBC/hpf   Bacteria, UA RARE (A) NONE SEEN   Squamous Epithelial / LPF 0-5 0 - 5   Mucus PRESENT    Uric Acid Crys, UA PRESENT   I-stat chem 8, ed  Result Value Ref Range   Sodium 141 135 - 145 mmol/L   Potassium 3.8 3.5 - 5.1 mmol/L   Chloride 108 98 - 111 mmol/L   BUN 11 8 - 23 mg/dL   Creatinine, Ser 7.82 0.44 - 1.00 mg/dL   Glucose, Bld 87 70 - 99 mg/dL   Calcium, Ion 9.56 (L) 1.15 - 1.40 mmol/L   TCO2 22 22 - 32 mmol/L   Hemoglobin 13.6 12.0 - 15.0 g/dL  HCT 40.0 36.0 - 46.0 %  I-stat troponin, ED  Result Value Ref Range   Troponin i, poc 0.00 0.00 - 0.08 ng/mL   Comment 3            Dg Chest 2 View  Result Date: 07/17/2018 CLINICAL DATA:  Syncopal episode. EXAM: CHEST - 2 VIEW COMPARISON:  03/07/2017 FINDINGS: Normal heart size and pulmonary vascularity. No focal airspace disease or consolidation in the lungs. No blunting of costophrenic angles. No pneumothorax. Mediastinal contours appear intact. Degenerative changes in the spine and shoulders. Postoperative changes in the cervical spine. Tortuous aorta. IMPRESSION: No evidence of active pulmonary disease. Electronically Signed   By: Burman Nieves M.D.   On: 07/17/2018 00:37   Ct Head Wo Contrast  Result Date: 07/16/2018 CLINICAL DATA:  77 y/o F; headache after syncope. Left temporal head injury today. EXAM: CT HEAD WITHOUT CONTRAST CT CERVICAL SPINE WITHOUT CONTRAST TECHNIQUE: Multidetector CT imaging of the head and cervical spine was performed following the standard protocol without intravenous contrast. Multiplanar CT image reconstructions of the cervical spine were also generated. COMPARISON:  05/07/2015 CT head FINDINGS: CT HEAD FINDINGS Brain: No evidence of acute infarction, hemorrhage, hydrocephalus, extra-axial collection or mass lesion/mass effect. 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 Mild chronic microvascular ischemic changes and volume loss of the brain. Vascular: Calcific atherosclerosis of the carotid siphons. No hyperdense vessel. Skull: Normal. Negative for fracture or focal lesion. Sinuses/Orbits: Left mastoid opacification. Normal aeration of the paranasal sinuses. Orbits are unremarkable. Other: None. CT CERVICAL SPINE FINDINGS Alignment: Normal. Skull base and vertebrae: C3-C6 anterior cervical discectomy and fusion. Left C3-C6 and right C4-C6 facet fusion. Soft tissues and spinal canal: No prevertebral fluid or swelling. No visible canal hematoma. Disc levels: Cervical spondylosis with advanced disc and facet arthropathy greatest at the C6-7 level. C6-T2 bilateral uncovertebral  and facet hypertrophy with foraminal stenosis. No high-grade bony canal stenosis. Upper chest: 16 mm nodule in the right lobe of the thyroid gland. Other: Negative. IMPRESSION: CT head: 1. No acute intracranial abnormality identified. 2. Mild stable chronic microvascular ischemic changes and volume loss of the brain. 3. 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 4. Chronic left mastoid effusion. CT cervical spine: 1. No acute fracture or dislocation. 2. C3-C6 ACDF without apparent hardware related complication. 3. Cervical spondylosis greatest at the C6-7 level. 4. 16 mm nodule in the right lobe of the thyroid. Thyroid ultrasound is recommended on a nonemergent basis. Electronically Signed   By: Mitzi Hansen M.D.   On: 07/16/2018 20:44   Ct Cervical Spine Wo Contrast  Result Date: 07/16/2018 CLINICAL DATA:  77 y/o F; headache after syncope. Left temporal head injury today. EXAM: CT HEAD WITHOUT CONTRAST CT CERVICAL SPINE WITHOUT CONTRAST TECHNIQUE: Multidetector CT imaging of the head and cervical spine was performed following the standard protocol without intravenous contrast. Multiplanar CT image reconstructions of the cervical spine were also generated. COMPARISON:  05/07/2015 CT head FINDINGS: CT HEAD FINDINGS Brain: No evidence of acute infarction, hemorrhage, hydrocephalus, extra-axial collection or mass lesion/mass effect. 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 Mild chronic microvascular ischemic changes and volume loss of the brain. Vascular: Calcific atherosclerosis of the carotid siphons. No hyperdense vessel. Skull: Normal. Negative for fracture or focal lesion. Sinuses/Orbits: Left mastoid opacification. Normal aeration of the paranasal sinuses. Orbits are unremarkable. Other: None. CT CERVICAL SPINE FINDINGS Alignment: Normal. Skull base and vertebrae: C3-C6 anterior cervical discectomy and  fusion. Left C3-C6 and right C4-C6 facet fusion. Soft  tissues and spinal canal: No prevertebral fluid or swelling. No visible canal hematoma. Disc levels: Cervical spondylosis with advanced disc and facet arthropathy greatest at the C6-7 level. C6-T2 bilateral uncovertebral and facet hypertrophy with foraminal stenosis. No high-grade bony canal stenosis. Upper chest: 16 mm nodule in the right lobe of the thyroid gland. Other: Negative. IMPRESSION: CT head: 1. No acute intracranial abnormality identified. 2. Mild stable chronic microvascular ischemic changes and volume loss of the brain. 3. 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 4. Chronic left mastoid effusion. CT cervical spine: 1. No acute fracture or dislocation. 2. C3-C6 ACDF without apparent hardware related complication. 3. Cervical spondylosis greatest at the C6-7 level. 4. 16 mm nodule in the right lobe of the thyroid. Thyroid ultrasound is recommended on a nonemergent basis. Electronically Signed   By: Mitzi Hansen M.D.   On: 07/16/2018 20:44   Dg Knee Complete 4 Views Left  Result Date: 07/16/2018 CLINICAL DATA:  Fall, knee pain EXAM: LEFT KNEE - COMPLETE 4+ VIEW COMPARISON:  08/13/2009 FINDINGS: Prior left knee replacement. No hardware bony complicating feature. Small joint effusion. Calcifications noted within the patellar tendon which appear chronic. No fracture, subluxation or dislocation. IMPRESSION: Prior left knee replacement. Small joint effusion. No acute bony abnormality. Electronically Signed   By: Charlett Nose M.D.   On: 07/16/2018 19:12   Dg Knee Complete 4 Views Right  Result Date: 07/16/2018 CLINICAL DATA:  Fall, knee pain EXAM: RIGHT KNEE - COMPLETE 4+ VIEW COMPARISON:  08/13/2009 FINDINGS: Advanced tricompartment degenerative changes within the right knee with joint space narrowing and spurring. No joint effusion. Posterior intra-articular loose bodies. No acute bony abnormality. Specifically, no fracture, subluxation, or dislocation. IMPRESSION: Advanced  degenerative changes.  No acute bony abnormality. Electronically Signed   By: Charlett Nose M.D.   On: 07/16/2018 19:10    EKG EKG Interpretation  Date/Time:  Friday July 17 2018 00:01:25 EDT Ventricular Rate:  61 PR Interval:  174 QRS Duration: 96 QT Interval:  422 QTC Calculation: 424 R Axis:   31 Text Interpretation:  Normal sinus rhythm Confirmed by Nicanor Alcon, Velma Agnes ([id number]) on 07/17/2018 12:06:18 AM   Radiology Dg Chest 2 View  Result Date: 07/17/2018 CLINICAL DATA:  Syncopal episode. EXAM: CHEST - 2 VIEW COMPARISON:  03/07/2017 FINDINGS: Normal heart size and pulmonary vascularity. No focal airspace disease or consolidation in the lungs. No blunting of costophrenic angles. No pneumothorax. Mediastinal contours appear intact. Degenerative changes in the spine and shoulders. Postoperative changes in the cervical spine. Tortuous aorta. IMPRESSION: No evidence of active pulmonary disease. Electronically Signed   By: Burman Nieves M.D.   On: 07/17/2018 00:37   Ct Head Wo Contrast  Result Date: 07/16/2018 CLINICAL DATA:  77 y/o F; headache after syncope. Left temporal head injury today. EXAM: CT HEAD WITHOUT CONTRAST CT CERVICAL SPINE WITHOUT CONTRAST TECHNIQUE: Multidetector CT imaging of the head and cervical spine was performed following the standard protocol without intravenous contrast. Multiplanar CT image reconstructions of the cervical spine were also generated. COMPARISON:  05/07/2015 CT head FINDINGS: CT HEAD FINDINGS Brain: No evidence of acute infarction, hemorrhage, hydrocephalus, extra-axial collection or mass lesion/mass effect. 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 Mild chronic microvascular ischemic changes and volume loss of the brain. Vascular: Calcific atherosclerosis of the carotid siphons. No hyperdense vessel. Skull: Normal. Negative for fracture or focal lesion. Sinuses/Orbits: Left mastoid opacification. Normal aeration of  the paranasal  sinuses. Orbits are unremarkable. Other: None. CT CERVICAL SPINE FINDINGS Alignment: Normal. Skull base and vertebrae: C3-C6 anterior cervical discectomy and fusion. Left C3-C6 and right C4-C6 facet fusion. Soft tissues and spinal canal: No prevertebral fluid or swelling. No visible canal hematoma. Disc levels: Cervical spondylosis with advanced disc and facet arthropathy greatest at the C6-7 level. C6-T2 bilateral uncovertebral and facet hypertrophy with foraminal stenosis. No high-grade bony canal stenosis. Upper chest: 16 mm nodule in the right lobe of the thyroid gland. Other: Negative. IMPRESSION: CT head: 1. No acute intracranial abnormality identified. 2. Mild stable chronic microvascular ischemic changes and volume loss of the brain. 3. 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 4. Chronic left mastoid effusion. CT cervical spine: 1. No acute fracture or dislocation. 2. C3-C6 ACDF without apparent hardware related complication. 3. Cervical spondylosis greatest at the C6-7 level. 4. 16 mm nodule in the right lobe of the thyroid. Thyroid ultrasound is recommended on a nonemergent basis. Electronically Signed   By: Mitzi HansenLance  Furusawa-Stratton M.D.   On: 07/16/2018 20:44   Ct Cervical Spine Wo Contrast  Result Date: 07/16/2018 CLINICAL DATA:  77 y/o F; headache after syncope. Left temporal head injury today. EXAM: CT HEAD WITHOUT CONTRAST CT CERVICAL SPINE WITHOUT CONTRAST TECHNIQUE: Multidetector CT imaging of the head and cervical spine was performed following the standard protocol without intravenous contrast. Multiplanar CT image reconstructions of the cervical spine were also generated. COMPARISON:  05/07/2015 CT head FINDINGS: CT HEAD FINDINGS Brain: No evidence of acute infarction, hemorrhage, hydrocephalus, extra-axial collection or mass lesion/mass effect. 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 Mild chronic microvascular ischemic changes and volume loss of the  brain. Vascular: Calcific atherosclerosis of the carotid siphons. No hyperdense vessel. Skull: Normal. Negative for fracture or focal lesion. Sinuses/Orbits: Left mastoid opacification. Normal aeration of the paranasal sinuses. Orbits are unremarkable. Other: None. CT CERVICAL SPINE FINDINGS Alignment: Normal. Skull base and vertebrae: C3-C6 anterior cervical discectomy and fusion. Left C3-C6 and right C4-C6 facet fusion. Soft tissues and spinal canal: No prevertebral fluid or swelling. No visible canal hematoma. Disc levels: Cervical spondylosis with advanced disc and facet arthropathy greatest at the C6-7 level. C6-T2 bilateral uncovertebral and facet hypertrophy with foraminal stenosis. No high-grade bony canal stenosis. Upper chest: 16 mm nodule in the right lobe of the thyroid gland. Other: Negative. IMPRESSION: CT head: 1. No acute intracranial abnormality identified. 2. Mild stable chronic microvascular ischemic changes and volume loss of the brain. 3. 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 4. Chronic left mastoid effusion. CT cervical spine: 1. No acute fracture or dislocation. 2. C3-C6 ACDF without apparent hardware related complication. 3. Cervical spondylosis greatest at the C6-7 level. 4. 16 mm nodule in the right lobe of the thyroid. Thyroid ultrasound is recommended on a nonemergent basis. Electronically Signed   By: Mitzi HansenLance  Furusawa-Stratton M.D.   On: 07/16/2018 20:44   Dg Knee Complete 4 Views Left  Result Date: 07/16/2018 CLINICAL DATA:  Fall, knee pain EXAM: LEFT KNEE - COMPLETE 4+ VIEW COMPARISON:  08/13/2009 FINDINGS: Prior left knee replacement. No hardware bony complicating feature. Small joint effusion. Calcifications noted within the patellar tendon which appear chronic. No fracture, subluxation or dislocation. IMPRESSION: Prior left knee replacement. Small joint effusion. No acute bony abnormality. Electronically Signed   By: Charlett NoseKevin  Dover M.D.   On: 07/16/2018 19:12   Dg  Knee Complete 4 Views Right  Result Date: 07/16/2018 CLINICAL DATA:  Fall, knee pain EXAM: RIGHT KNEE - COMPLETE 4+ VIEW COMPARISON:  08/13/2009 FINDINGS: Advanced tricompartment degenerative changes within the right knee with joint space narrowing and spurring. No joint effusion. Posterior intra-articular loose bodies. No acute bony abnormality. Specifically, no fracture, subluxation, or dislocation. IMPRESSION: Advanced degenerative changes.  No acute bony abnormality. Electronically Signed   By: Charlett Nose M.D.   On: 07/16/2018 19:10    Procedures Procedures (including critical care time)  Medications Ordered in ED Medications - No data to display    Final Clinical Impressions(s) / ED Diagnoses   The laceration is black and devitalized and too old to be closes.  Patient needs admission for syncope.     Phineas Mcenroe, MD 07/17/18 1610

## 2018-07-17 NOTE — Progress Notes (Signed)
Pt alert and oriented being discharged with husband at the bedsideI.

## 2018-09-14 ENCOUNTER — Telehealth: Payer: Self-pay | Admitting: Neurology

## 2018-09-14 ENCOUNTER — Ambulatory Visit: Payer: Medicare Other | Admitting: Neurology

## 2018-09-14 ENCOUNTER — Encounter: Payer: Self-pay | Admitting: Neurology

## 2018-09-14 NOTE — Telephone Encounter (Signed)
Patient called today and states that she was sick and unable to make apt. States she will call back when ready to schedule.

## 2019-02-14 ENCOUNTER — Inpatient Hospital Stay (HOSPITAL_COMMUNITY)
Admission: EM | Admit: 2019-02-14 | Discharge: 2019-02-17 | DRG: 309 | Disposition: A | Discharge status: Home Health | Payer: Medicare Other | Attending: Internal Medicine | Admitting: Internal Medicine

## 2019-02-14 ENCOUNTER — Emergency Department (HOSPITAL_COMMUNITY): Payer: Medicare Other

## 2019-02-14 ENCOUNTER — Encounter (HOSPITAL_COMMUNITY): Payer: Self-pay

## 2019-02-14 ENCOUNTER — Other Ambulatory Visit: Payer: Self-pay

## 2019-02-14 DIAGNOSIS — D709 Neutropenia, unspecified: Secondary | ICD-10-CM | POA: Diagnosis present

## 2019-02-14 DIAGNOSIS — J40 Bronchitis, not specified as acute or chronic: Secondary | ICD-10-CM

## 2019-02-14 DIAGNOSIS — J111 Influenza due to unidentified influenza virus with other respiratory manifestations: Secondary | ICD-10-CM | POA: Diagnosis present

## 2019-02-14 DIAGNOSIS — D703 Neutropenia due to infection: Secondary | ICD-10-CM

## 2019-02-14 DIAGNOSIS — K920 Hematemesis: Secondary | ICD-10-CM | POA: Diagnosis present

## 2019-02-14 DIAGNOSIS — M797 Fibromyalgia: Secondary | ICD-10-CM | POA: Diagnosis present

## 2019-02-14 DIAGNOSIS — Z79899 Other long term (current) drug therapy: Secondary | ICD-10-CM

## 2019-02-14 DIAGNOSIS — R042 Hemoptysis: Secondary | ICD-10-CM | POA: Diagnosis present

## 2019-02-14 DIAGNOSIS — I4891 Unspecified atrial fibrillation: Secondary | ICD-10-CM | POA: Diagnosis not present

## 2019-02-14 DIAGNOSIS — Z90710 Acquired absence of both cervix and uterus: Secondary | ICD-10-CM

## 2019-02-14 DIAGNOSIS — Z8542 Personal history of malignant neoplasm of other parts of uterus: Secondary | ICD-10-CM

## 2019-02-14 DIAGNOSIS — K219 Gastro-esophageal reflux disease without esophagitis: Secondary | ICD-10-CM | POA: Diagnosis present

## 2019-02-14 DIAGNOSIS — I10 Essential (primary) hypertension: Secondary | ICD-10-CM | POA: Diagnosis not present

## 2019-02-14 DIAGNOSIS — Z96652 Presence of left artificial knee joint: Secondary | ICD-10-CM | POA: Diagnosis present

## 2019-02-14 DIAGNOSIS — Z90722 Acquired absence of ovaries, bilateral: Secondary | ICD-10-CM

## 2019-02-14 DIAGNOSIS — R0602 Shortness of breath: Secondary | ICD-10-CM | POA: Diagnosis not present

## 2019-02-14 DIAGNOSIS — H9190 Unspecified hearing loss, unspecified ear: Secondary | ICD-10-CM | POA: Diagnosis present

## 2019-02-14 DIAGNOSIS — E559 Vitamin D deficiency, unspecified: Secondary | ICD-10-CM | POA: Diagnosis present

## 2019-02-14 DIAGNOSIS — I480 Paroxysmal atrial fibrillation: Secondary | ICD-10-CM | POA: Diagnosis present

## 2019-02-14 DIAGNOSIS — J9801 Acute bronchospasm: Secondary | ICD-10-CM | POA: Diagnosis present

## 2019-02-14 DIAGNOSIS — Z7982 Long term (current) use of aspirin: Secondary | ICD-10-CM

## 2019-02-14 DIAGNOSIS — Z981 Arthrodesis status: Secondary | ICD-10-CM

## 2019-02-14 LAB — CBC WITH DIFFERENTIAL/PLATELET
Abs Immature Granulocytes: 0 10*3/uL (ref 0.00–0.07)
Basophils Absolute: 0 10*3/uL (ref 0.0–0.1)
Basophils Relative: 0 %
Eosinophils Absolute: 0 10*3/uL (ref 0.0–0.5)
Eosinophils Relative: 0 %
HCT: 42.6 % (ref 36.0–46.0)
Hemoglobin: 14 g/dL (ref 12.0–15.0)
Lymphocytes Relative: 61 %
Lymphs Abs: 1.5 10*3/uL (ref 0.7–4.0)
MCH: 30.6 pg (ref 26.0–34.0)
MCHC: 32.9 g/dL (ref 30.0–36.0)
MCV: 93.2 fL (ref 80.0–100.0)
Monocytes Absolute: 0.3 10*3/uL (ref 0.1–1.0)
Monocytes Relative: 11 %
Neutro Abs: 0.7 10*3/uL — ABNORMAL LOW (ref 1.7–7.7)
Neutrophils Relative %: 28 %
Platelets: 210 10*3/uL (ref 150–400)
RBC: 4.57 MIL/uL (ref 3.87–5.11)
RDW: 11.9 % (ref 11.5–15.5)
WBC: 2.4 10*3/uL — ABNORMAL LOW (ref 4.0–10.5)
nRBC: 0 % (ref 0.0–0.2)

## 2019-02-14 LAB — COMPREHENSIVE METABOLIC PANEL
ALT: 18 U/L (ref 0–44)
AST: 30 U/L (ref 15–41)
Albumin: 3.4 g/dL — ABNORMAL LOW (ref 3.5–5.0)
Alkaline Phosphatase: 58 U/L (ref 38–126)
Anion gap: 8 (ref 5–15)
BUN: 14 mg/dL (ref 8–23)
CO2: 24 mmol/L (ref 22–32)
Calcium: 8.5 mg/dL — ABNORMAL LOW (ref 8.9–10.3)
Chloride: 103 mmol/L (ref 98–111)
Creatinine, Ser: 0.8 mg/dL (ref 0.44–1.00)
GFR calc Af Amer: >60 mL/min (ref >60)
GFR calc non Af Amer: >60 mL/min (ref >60)
Glucose, Bld: 91 mg/dL (ref 70–99)
Potassium: 3.8 mmol/L (ref 3.5–5.1)
Sodium: 135 mmol/L (ref 135–145)
Total Bilirubin: 1.1 mg/dL (ref 0.3–1.2)
Total Protein: 7.1 g/dL (ref 6.5–8.1)

## 2019-02-14 LAB — I-STAT TROPONIN, ED: Troponin i, poc: 0.01 ng/mL (ref 0.00–0.08)

## 2019-02-14 LAB — BRAIN NATRIURETIC PEPTIDE: B Natriuretic Peptide: 84.2 pg/mL (ref 0.0–100.0)

## 2019-02-14 MED ORDER — DILTIAZEM HCL-DEXTROSE 100-5 MG/100ML-% IV SOLN (PREMIX)
5.0000 mg/h | INTRAVENOUS | Status: DC
  Administered 2019-02-14: 5 mg/h via INTRAVENOUS
  Filled 2019-02-14: qty 100

## 2019-02-14 MED ORDER — ASPIRIN EC 81 MG PO TBEC
81.0000 mg | DELAYED_RELEASE_TABLET | Freq: Every day | ORAL | Status: DC
  Administered 2019-02-15: 81 mg via ORAL
  Filled 2019-02-14: qty 1

## 2019-02-14 MED ORDER — DEXAMETHASONE 4 MG PO TABS
10.0000 mg | ORAL_TABLET | Freq: Once | ORAL | Status: AC
  Administered 2019-02-14: 10 mg via ORAL
  Filled 2019-02-14: qty 3

## 2019-02-14 MED ORDER — SODIUM CHLORIDE 0.9% FLUSH: 3.0000 mL | INTRAVENOUS | Status: DC | PRN

## 2019-02-14 MED ORDER — ACETAMINOPHEN 650 MG RE SUPP: 650.0000 mg | Freq: Four times a day (QID) | RECTAL | Status: DC | PRN

## 2019-02-14 MED ORDER — POTASSIUM CHLORIDE CRYS ER 20 MEQ PO TBCR
20.0000 meq | EXTENDED_RELEASE_TABLET | Freq: Once | ORAL | Status: AC
  Administered 2019-02-15: 20 meq via ORAL
  Filled 2019-02-14: qty 1

## 2019-02-14 MED ORDER — METOPROLOL TARTRATE 5 MG/5ML IV SOLN
5.0000 mg | Freq: Once | INTRAVENOUS | Status: AC
  Administered 2019-02-14: 5 mg via INTRAVENOUS
  Filled 2019-02-14: qty 5

## 2019-02-14 MED ORDER — IPRATROPIUM-ALBUTEROL 0.5-2.5 (3) MG/3ML IN SOLN
3.0000 mL | Freq: Once | RESPIRATORY_TRACT | Status: AC
  Administered 2019-02-14: 3 mL via RESPIRATORY_TRACT
  Filled 2019-02-14: qty 3

## 2019-02-14 MED ORDER — SODIUM CHLORIDE 0.9 % IV BOLUS
1000.0000 mL | Freq: Once | INTRAVENOUS | Status: AC
  Administered 2019-02-14: 1000 mL via INTRAVENOUS

## 2019-02-14 MED ORDER — LEVALBUTEROL HCL 0.63 MG/3ML IN NEBU: 0.6300 mg | INHALATION_SOLUTION | Freq: Four times a day (QID) | RESPIRATORY_TRACT | Status: DC | PRN

## 2019-02-14 MED ORDER — SODIUM CHLORIDE 0.9% FLUSH
3.0000 mL | Freq: Two times a day (BID) | INTRAVENOUS | Status: DC
  Administered 2019-02-16 – 2019-02-17 (×2): 3 mL via INTRAVENOUS

## 2019-02-14 MED ORDER — PROMETHAZINE HCL 25 MG PO TABS: 12.5000 mg | ORAL_TABLET | Freq: Four times a day (QID) | ORAL | Status: DC | PRN

## 2019-02-14 MED ORDER — ENOXAPARIN SODIUM 40 MG/0.4ML ~~LOC~~ SOLN
40.0000 mg | Freq: Every day | SUBCUTANEOUS | Status: DC
  Administered 2019-02-15: 40 mg via SUBCUTANEOUS
  Filled 2019-02-14: qty 0.4

## 2019-02-14 MED ORDER — ACETAMINOPHEN 325 MG PO TABS: 650.0000 mg | ORAL_TABLET | Freq: Four times a day (QID) | ORAL | Status: DC | PRN

## 2019-02-14 MED ORDER — SODIUM CHLORIDE 0.9 % IV SOLN: 250.0000 mL | INTRAVENOUS | Status: DC | PRN

## 2019-02-14 MED ORDER — DILTIAZEM LOAD VIA INFUSION
10.0000 mg | Freq: Once | INTRAVENOUS | Status: AC
  Administered 2019-02-14: 10 mg via INTRAVENOUS
  Filled 2019-02-14: qty 10

## 2019-02-14 MED ORDER — SODIUM CHLORIDE 0.9% FLUSH
3.0000 mL | Freq: Two times a day (BID) | INTRAVENOUS | Status: DC
  Administered 2019-02-15 – 2019-02-17 (×2): 3 mL via INTRAVENOUS

## 2019-02-14 MED ORDER — HYDROCODONE-ACETAMINOPHEN 5-325 MG PO TABS: 1.0000 | ORAL_TABLET | ORAL | Status: DC | PRN

## 2019-02-14 MED ORDER — ALBUTEROL SULFATE HFA 108 (90 BASE) MCG/ACT IN AERS
2.0000 | INHALATION_SPRAY | Freq: Once | RESPIRATORY_TRACT | Status: AC
  Administered 2019-02-14: 2 via RESPIRATORY_TRACT
  Filled 2019-02-14: qty 6.7

## 2019-02-14 NOTE — ED Notes (Signed)
Pt tele resembling afib, in and out of [facility].  EKG captured and MD notified.

## 2019-02-14 NOTE — H&P (Signed)
History and Physical    Phyllis Knight:[id number] DOB: 1941-07-31 DOA: 02/14/2019  PCP: Merri Brunette, MD   Patient coming from: Home   Chief Complaint: Lightheaded, DOE, palpitations   HPI: Phyllis Knight is a 78 y.o. female with medical history significant for hypertension, fibromyalgia, and recent influenza, now presenting to the emergency department for evaluation of lightheadedness, exertional dyspnea, and an episode of bloody sputum.  Patient reports that she developed a flulike illness approximately 1 week ago, saw her PCP on 02/09/2019, tested positive for influenza, and was started on Tamiflu.  The flu symptoms had been improving but she developed fairly acute lightheadedness with chest discomfort/palpitations, and re-worsening in her dyspnea.  Her cough has persisted, but the aches and fever/chills have improved.  She had an episode of blood-tinged sputum 2 days ago and stopped taking Tamiflu at that time.  She denies chest pain per se, but has some fluttering chest discomfort.  She denies lower extremity swelling or tenderness, recent immobilization or surgery, or personal or family history of VTE.  ED Course: Upon arrival to the ED, patient is found to be afebrile, saturating mid 90s on room air, slightly tachypneic, tachycardic in the 130s, and with blood pressure 100/74.  EKG features a tachyarrhythmia, likely atrial fibrillation, with rate 134.  Chest x-ray is notable for mild left basilar atelectasis only.  Chemistry panel is unremarkable and CBC notable for WBC 2400 with ANC 670.  Troponin and BNP are both normal.  Patient was given a liter of normal saline, 5 mg IV Lopressor, albuterol, DuoNeb, Decadron, continued to be in atrial fibrillation with RVR, was given bolus of IV diltiazem, and then started on diltiazem infusion.  Heart rate has improved to the low 100s, blood pressure remained stable, and she will be observed for ongoing evaluation and management.  Review of  Systems:  All other systems reviewed and apart from HPI, are negative.  Past Medical History:  Diagnosis Date  . Anemia    pt denies  . Arthritis   . Bradycardia   . Bronchitis    hx of   . Chest pain 06-10-14   had chest pain thia am  . Dizziness   . Fibromyalgia   . 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 GERD (gastroesophageal reflux disease)   . H/O hiatal hernia   . Hearing loss    left ear   . History of uterine cancer 1975   s/p hysterectomy  . Hypertension   . Numbness and tingling    hands and feet bilat   . Pneumonia yrs ago  . Shortness of breath dyspnea   . Syncope 07/16/2018  . Tinnitus   . Urge urinary incontinence   . Urinary incontinence   . Vitamin D deficiency     Past Surgical History:  Procedure Laterality Date  . CERVICAL FUSION  2011  . CHOLECYSTECTOMY N/A 06/14/2014   Procedure: LAPAROSCOPIC CHOLECYSTECTOMY WITH attempted INTRAOPERATIVE CHOLANGIOGRAM;  Surgeon: [name], MD;  Location: [facility];  Service: General;  Laterality: N/A;  . INSERTION OF MESH N/A 07/18/2015   Procedure: INSERTION OF MESH;  Surgeon: Ovidio Kin, MD;  Location: [facility];  Service: General;  Laterality: N/A;  . KNEE CLOSED REDUCTION Left 09/21/2013   Procedure: CLOSED MANIPULATION LEFT KNEE;  Surgeon: Shelda Pal, MD;  Location: [facility];  Service: Orthopedics;  Laterality: Left;  . TOTAL ABDOMINAL HYSTERECTOMY W/ BILATERAL SALPINGOOPHORECTOMY  1972  .  TOTAL KNEE ARTHROPLASTY Left 08/03/2013   Procedure: LEFT TOTAL KNEE ARTHROPLASTY;  Surgeon: Shelda Pal, MD;  Location: [facility];  Service: Orthopedics;  Laterality: Left;  . tracheotomy    . TYMPANOPLASTY Left   . VENTRAL HERNIA REPAIR N/A 07/18/2015   Procedure: LAPAROSCOPIC VENTRAL AND UMBILICAL  HERNIA LYSIS OF ADHESIONS;  Surgeon: Ovidio Kin, MD;  Location: [facility];  Service: General;  Laterality: N/A;     reports that she has never smoked. She has never used smokeless  tobacco. She reports that she does not drink alcohol or use drugs.  Allergies  Allergen Reactions  . Codeine Nausea And Vomiting  . Zofran [Ondansetron Hcl] Rash    Family History  Problem Relation Age of Onset  . Stroke Mother   . Stroke Father      Prior to Admission medications   Medication Sig Start Date End Date Taking? Authorizing Provider  acetaminophen (TYLENOL) 500 MG tablet Take 500 mg by mouth every 6 (six) hours as needed for mild pain, moderate pain, fever or headache.    Yes [provider]  albuterol (PROVENTIL HFA;VENTOLIN HFA) 108 (90 Base) MCG/ACT inhaler Inhale 2 puffs into the lungs every 4 (four) hours as needed for wheezing or shortness of breath.  02/10/19  Yes [provider]  amLODipine (NORVASC) 10 MG tablet Take 10 mg by mouth daily.    Yes [provider]  aspirin EC 81 MG tablet Take 81 mg by mouth daily.   Yes [provider]  benazepril (LOTENSIN) 20 MG tablet Take 20 mg by mouth daily.    Yes [provider]  cholecalciferol (VITAMIN D) 1000 units tablet Take 2,000 Units by mouth daily.    Yes [provider]  oseltamivir (TAMIFLU) 75 MG capsule Take 75 mg by mouth at bedtime. 02/10/19  Yes [provider]  OVER THE COUNTER MEDICATION Apply 1 application topically daily as needed (itching). OTC cream for itching   Yes [provider]  OVER THE COUNTER MEDICATION Take 1 tablet by mouth daily. otc medication for dizziness   Yes [provider]  potassium chloride (KLOR-CON) 8 MEQ tablet Take 8 mEq by mouth daily.  04/17/15  Yes [provider]    Physical Exam: Vitals:   02/14/19 2230 02/14/19 2245 02/14/19 2300 02/14/19 2330  BP: 112/82 110/79 109/84 110/88  Pulse: (!) 147 98 (!) 104 96  Resp: (!) 23 (!) 27 (!) 27 (!) 22  Temp:      TempSrc:      SpO2: 92% 97% 96% 98%  Weight:      Height:        Constitutional: NAD, calm  Eyes: PERTLA, lids and conjunctivae  normal ENMT: Mucous membranes are moist. Posterior pharynx clear of any exudate or lesions.   Neck: normal, supple, no masses, no thyromegaly Respiratory: Expiratory wheezes, speaking full sentences. No accessory muscle use.  Cardiovascular: Rate ~110 and irregular. Trace pedal edema. No significant JVD. Abdomen: No distension, no tenderness, soft. Bowel sounds normal.  Musculoskeletal: no clubbing / cyanosis. No joint deformity upper and lower extremities.   Skin: no significant rashes, lesions, ulcers. Warm, dry, well-perfused. Neurologic: CN 2-12 grossly intact. Sensation intact. Strength 5/5 in all 4 limbs.  Psychiatric:  Alert and oriented to person, place, and situation. Pleasant, cooperative.    Labs on Admission: I have personally reviewed following labs and imaging studies  CBC: Recent Labs  Lab 02/14/19 1725  WBC 2.4*  NEUTROABS 0.7*  HGB 14.0  HCT 42.6  MCV 93.2  PLT 210   Basic Metabolic Panel: Recent Labs  Lab 02/14/19 1725  NA 135  K 3.8  CL 103  CO2 24  GLUCOSE 91  BUN 14  CREATININE 0.80  CALCIUM 8.5*   GFR: Estimated Creatinine Clearance: 68.1 mL/min (by C-G formula based on SCr of 0.8 mg/dL). Liver Function Tests: Recent Labs  Lab 02/14/19 1725  AST 30  ALT 18  ALKPHOS 58  BILITOT 1.1  PROT 7.1  ALBUMIN 3.4*   No results for input(s): LIPASE, AMYLASE in the last 168 hours. No results for input(s): AMMONIA in the last 168 hours. Coagulation Profile: No results for input(s): INR, PROTIME in the last 168 hours. Cardiac Enzymes: No results for input(s): CKTOTAL, CKMB, CKMBINDEX, TROPONINI in the last 168 hours. BNP (last 3 results) No results for input(s): PROBNP in the last 8760 hours. HbA1C: No results for input(s): HGBA1C in the last 72 hours. CBG: No results for input(s): GLUCAP in the last 168 hours. Lipid Profile: No results for input(s): CHOL, HDL, LDLCALC, TRIG, CHOLHDL, LDLDIRECT in the last 72 hours. Thyroid Function Tests: No  results for input(s): TSH, T4TOTAL, FREET4, T3FREE, THYROIDAB in the last 72 hours. Anemia Panel: No results for input(s): VITAMINB12, FOLATE, FERRITIN, TIBC, IRON, RETICCTPCT in the last 72 hours. Urine analysis:    Component Value Date/Time   COLORURINE YELLOW 07/16/2018 2353   APPEARANCEUR CLOUDY (A) 07/16/2018 2353   LABSPEC 1.016 07/16/2018 2353   PHURINE 5.0 07/16/2018 2353   GLUCOSEU NEGATIVE 07/16/2018 2353   HGBUR SMALL (A) 07/16/2018 2353   BILIRUBINUR NEGATIVE 07/16/2018 2353   KETONESUR NEGATIVE 07/16/2018 2353   PROTEINUR NEGATIVE 07/16/2018 2353   UROBILINOGEN 1.0 05/19/2014 2348   NITRITE NEGATIVE 07/16/2018 2353   LEUKOCYTESUR TRACE (A) 07/16/2018 2353   Sepsis Labs: (procalcitonin:4,lacticidven:4) )No results found for this or any previous visit (from the past 240 hour(s)).   Radiological Exams on Admission: Dg Chest 2 View  Result Date: 02/14/2019 CLINICAL DATA:  Flu.  Coughing up blood tinged sputum. EXAM: CHEST - 2 VIEW COMPARISON:  July 17, 2018 FINDINGS: Linear platelike opacities in the lateral left lower lung are likely atelectasis. A tortuous thoracic aorta is stable. The heart, hila, and mediastinum are unchanged. No pneumothorax. No nodules or masses. No focal infiltrates. IMPRESSION: Mild left basilar atelectasis. No acute infiltrate or other abnormality. Electronically Signed   By: Gerome Sam III M.D   On: 02/14/2019 16:21    EKG: Independently reviewed. Atrial fibrillation, rate 134.   Assessment/Plan   1. Atrial fibrillation with RVR  - Had been recovering from influenza when she developed palpitations, lightheadedness, and worsening DOE  - She was in and out of atrial fibrillation initially in ED, now remaining in atrial fib for the past several hours with rates 120-130's despite Lopressor IVP and IV bolus diltiazem  - This appears to be new  - She had echocardiogram in August 2019 with normal left atrial size and no significant  valvular disease  - Likely precipitated by the respiratory illness  - CHADS-VASc is 35 (age x2, gender, HTN), but given the recent echo results and acute respiratory illness, this is likely to be transient  - Treat underlying respiratory illness as below, check TSH and mag level, replace potassium to 4 and mag to 2, continue diltiazem infusion for now with titration    2. Influenza; bronchospasm  - Pt reports seeing PCP for flu-like illness on 3/10, tested positive  for influenza, and was started on Tamiflu  - She continues to have cough as expected but reports that fevers and aches have improved   - She is wheezing in ED and was treated with Decadron and nebs  - Continue supportive care with bronchodilators, APAP, Robitussin    3. Neutropenia  - ANC is 670 on admission  - DDx includes influenza that was diagnosed on 3/10, Tamiflu, nutritional deficiency, autoimmune  - Check B12, folate, and HIV; repeat CBC with diff in am   - Culture and start empiric antibiotics if febrile   4. Hypertension  - SBP low 100's in ED  - Hold antihypertensive for now and resume as needed     DVT prophylaxis: Lovenox  Code Status: Full  Family Communication: Husband updated at bedside Consults called: none Admission status: Observation     Briscoe Deutscher, MD [facility] Pager [phone]  If 7PM-7AM, please contact night-coverage [email or web address] Password [facility]  02/14/2019, 11:55 PM

## 2019-02-14 NOTE — ED Triage Notes (Addendum)
Pt states tested positive for flu at PCP last Tuesday, was given tamiflu and took 3 days worth but stopped after she started coughing up blood tinged sputum as she thought the medication caused this. Reports HA, weakness, lightheadedness "I feel like im going to pass out", shortness of breath with min exertion, chest and legs hurt. Took tylenol last night around 2300. Has not taken any medications today.

## 2019-02-14 NOTE — ED Provider Notes (Signed)
[facility] EMERGENCY DEPARTMENT Provider Note   CSN: [id number] Arrival date & time: 02/14/19  1440    History   Chief Complaint Chief Complaint  Patient presents with  . Influenza  . Shortness of Breath  . Dizziness  . Weakness    HPI Phyllis Knight is a 78 y.o. female.     HPI   Diagnosed with the flu last Tuesday, had positive swab Feels lightheaded when walking Headache Chest pain, goes back and forth left to right, worse with coughing/deep breaths, aching Still having body aches Tuesday had fevers, these have improved Mild shortness of breath Still coughing, coughing up yellow phlegm Not coughing up blood Took 3 days of tamiflu, had some mucus mixed with blood thought it was the medication and stopped taking it Bilateral feet swelling Tamiflu caused diarrhea No nausea or vomiting No dysuria  No hx of DVT/PE, not on blood thinners, no recent surgeries/travel=    Past Medical History:  Diagnosis Date  . Anemia    pt denies  . Arthritis   . Bradycardia   . Bronchitis    hx of   . Chest pain 06-10-14   had chest pain thia am  . Dizziness   . Fibromyalgia   . 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 GERD (gastroesophageal reflux disease)   . H/O hiatal hernia   . Hearing loss    left ear   . History of uterine cancer 1975   s/p hysterectomy  . Hypertension   . Numbness and tingling    hands and feet bilat   . Pneumonia yrs ago  . Shortness of breath dyspnea   . Syncope 07/16/2018  . Tinnitus   . Urge urinary incontinence   . Urinary incontinence   . Vitamin D deficiency     Patient Active Problem List   Diagnosis Date Noted  . Influenza 02/14/2019  . Atrial fibrillation with RVR (HCC) 02/14/2019  . Neutropenia (HCC) 02/14/2019  . Hypertension 07/17/2018  . Fibromyalgia 07/17/2018  . Left knee pain 07/17/2018  . 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 Expected blood loss anemia 08/04/2013  . Obese  08/04/2013  . 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 CERVICAL FUSION  2011  . CHOLECYSTECTOMY N/A 06/14/2014   Procedure: LAPAROSCOPIC CHOLECYSTECTOMY WITH attempted INTRAOPERATIVE CHOLANGIOGRAM;  Surgeon: [name], MD;  Location: [facility];  Service: General;  Laterality: N/A;  . INSERTION OF MESH N/A 07/18/2015   Procedure: INSERTION OF MESH;  Surgeon: Ovidio Kin, MD;  Location: [facility];  Service: General;  Laterality: N/A;  . KNEE CLOSED REDUCTION Left 09/21/2013   Procedure: CLOSED MANIPULATION LEFT KNEE;  Surgeon: Shelda Pal, MD;  Location: [facility];  Service: Orthopedics;  Laterality: Left;  . TOTAL ABDOMINAL HYSTERECTOMY W/ BILATERAL SALPINGOOPHORECTOMY  1972  . TOTAL KNEE ARTHROPLASTY Left 08/03/2013   Procedure: LEFT TOTAL KNEE ARTHROPLASTY;  Surgeon: Shelda Pal, MD;  Location: [facility];  Service: Orthopedics;  Laterality: Left;  . tracheotomy    . TYMPANOPLASTY Left   . VENTRAL HERNIA REPAIR N/A 07/18/2015   Procedure: LAPAROSCOPIC VENTRAL AND UMBILICAL  HERNIA LYSIS OF ADHESIONS;  Surgeon: Ovidio Kin, MD;  Location: [facility];  Service: General;  Laterality: N/A;     OB History   No obstetric history on file.      Home Medications    Prior to  Admission medications   Medication Sig Start Date End Date Taking? Authorizing Provider  acetaminophen (TYLENOL) 500 MG tablet Take 500 mg by mouth every 6 (six) hours as needed for mild pain, moderate pain, fever or headache.    Yes [provider]  albuterol (PROVENTIL HFA;VENTOLIN HFA) 108 (90 Base) MCG/ACT inhaler Inhale 2 puffs into the lungs every 4 (four) hours as needed for wheezing or shortness of breath.  02/10/19  Yes [provider]  amLODipine (NORVASC) 10 MG tablet Take 10 mg by mouth daily.    Yes [provider]  aspirin EC 81 MG tablet Take 81 mg by mouth daily.   Yes [provider]  benazepril (LOTENSIN) 20 MG tablet  Take 20 mg by mouth daily.    Yes [provider]  cholecalciferol (VITAMIN D) 1000 units tablet Take 2,000 Units by mouth daily.    Yes [provider]  oseltamivir (TAMIFLU) 75 MG capsule Take 75 mg by mouth at bedtime. 02/10/19  Yes [provider]  OVER THE COUNTER MEDICATION Apply 1 application topically daily as needed (itching). OTC cream for itching   Yes [provider]  OVER THE COUNTER MEDICATION Take 1 tablet by mouth daily. otc medication for dizziness   Yes [provider]  potassium chloride (KLOR-CON) 8 MEQ tablet Take 8 mEq by mouth daily.  04/17/15  Yes [provider]    Family History Family History  Problem Relation Age of Onset  . Stroke Mother   . Stroke Father     Social History Social History   Tobacco Use  . Smoking status: Never Smoker  . Smokeless tobacco: Never Used  Substance Use Topics  . Alcohol use: No  . Drug use: No     Allergies   Codeine and Zofran [ondansetron hcl]   Review of Systems Review of Systems  Constitutional: Positive for appetite change and fatigue. Negative for fever.  HENT: Positive for rhinorrhea. Negative for sore throat.   Eyes: Negative for visual disturbance.  Respiratory: Positive for shortness of breath. Negative for cough.   Cardiovascular: Positive for chest pain.  Gastrointestinal: Positive for diarrhea. Negative for abdominal pain, nausea and vomiting.  Genitourinary: Negative for difficulty urinating.  Musculoskeletal: Positive for arthralgias. Negative for back pain and neck pain.  Skin: Negative for rash.  Neurological: Positive for light-headedness and headaches. Negative for syncope.     Physical Exam Updated Vital Signs BP 110/88   Pulse 96   Temp (!) 97.5 F (36.4 C) (Oral)   Resp (!) 22   Ht 5\' 6"  (1.676 m)   Wt 97.1 kg   SpO2 98%   BMI 34.54 kg/m   Physical Exam Vitals signs and nursing note reviewed.  Constitutional:      General:  She is not in acute distress.    Appearance: She is well-developed. She is not diaphoretic.  HENT:     Head: Normocephalic and atraumatic.  Eyes:     Conjunctiva/sclera: Conjunctivae normal.  Neck:     Musculoskeletal: Normal range of motion.  Cardiovascular:     Rate and Rhythm: Normal rate and regular rhythm.     Heart sounds: Normal heart sounds. No murmur. No friction rub. No gallop.   Pulmonary:     Effort: Pulmonary effort is normal. No respiratory distress.     Breath sounds: Wheezing (bibasilar) present. No rales.  Abdominal:     General: There is no distension.     Palpations: Abdomen is soft.  Tenderness: There is no abdominal tenderness. There is no guarding.  Musculoskeletal:        General: No tenderness.  Skin:    General: Skin is warm and dry.     Findings: No erythema or rash.  Neurological:     Mental Status: She is alert and oriented to person, place, and time.      ED Treatments / Results  Labs (all labs ordered are listed, but only abnormal results are displayed) Labs Reviewed  CBC WITH DIFFERENTIAL/PLATELET - Abnormal; Notable for the following components:      Result Value   WBC 2.4 (*)    Neutro Abs 0.7 (*)    All other components within normal limits  COMPREHENSIVE METABOLIC PANEL - Abnormal; Notable for the following components:   Calcium 8.5 (*)    Albumin 3.4 (*)    All other components within normal limits  BRAIN NATRIURETIC PEPTIDE  PATHOLOGIST SMEAR REVIEW  BASIC METABOLIC PANEL  CBC WITH DIFFERENTIAL/PLATELET  TSH  MAGNESIUM  VITAMIN B12  FOLATE RBC  HIV ANTIBODY (ROUTINE TESTING W REFLEX)  I-STAT TROPONIN, ED    EKG EKG Interpretation  Date/Time:  Sunday February 14 2019 19:08:45 EDT Ventricular Rate:  134 PR Interval:    QRS Duration: 89 QT Interval:  311 QTC Calculation: 465 R Axis:   25 Text Interpretation:  Atrial fibrillation with RVR Probable left atrial enlargement Posterior infarct, old Nonspecific T  abnormalities, lateral leads Confirmed by Alvira Monday ([id number]) on 02/14/2019 11:32:42 PM   Radiology Dg Chest 2 View  Result Date: 02/14/2019 CLINICAL DATA:  Flu.  Coughing up blood tinged sputum. EXAM: CHEST - 2 VIEW COMPARISON:  July 17, 2018 FINDINGS: Linear platelike opacities in the lateral left lower lung are likely atelectasis. A tortuous thoracic aorta is stable. The heart, hila, and mediastinum are unchanged. No pneumothorax. No nodules or masses. No focal infiltrates. IMPRESSION: Mild left basilar atelectasis. No acute infiltrate or other abnormality. Electronically Signed   By: Gerome Sam III M.D   On: 02/14/2019 16:21    Procedures .Critical Care Performed by: Alvira Monday, MD Authorized by: Alvira Monday, MD   Critical care provider statement:    Critical care time (minutes):  30   Critical care was time spent personally by me on the following activities:  Evaluation of patient's response to treatment, examination of patient, ordering and performing treatments and interventions, ordering and review of laboratory studies, ordering and review of radiographic studies, pulse oximetry, re-evaluation of patient's condition, obtaining history from patient or surrogate and review of old charts   (including critical care time)  Medications Ordered in ED Medications  diltiazem (CARDIZEM) 1 mg/mL load via infusion 10 mg (10 mg Intravenous Bolus from Bag 02/14/19 2204)    And  diltiazem (CARDIZEM) 100 mg in dextrose 5% (1 mg/mL) infusion (5 mg/hr Intravenous New Bag/Given 02/14/19 2206)  aspirin EC tablet 81 mg (has no administration in time range)  enoxaparin (LOVENOX) injection 40 mg (has no administration in time range)  sodium chloride flush (NS) 0.9 % injection 3 mL (has no administration in time range)  sodium chloride flush (NS) 0.9 % injection 3 mL (has no administration in time range)  sodium chloride flush (NS) 0.9 % injection 3 mL (has no administration  in time range)  0.9 %  sodium chloride infusion (has no administration in time range)  acetaminophen (TYLENOL) tablet 650 mg (has no administration in time range)    Or  acetaminophen (TYLENOL) suppository  650 mg (has no administration in time range)  HYDROcodone-acetaminophen (NORCO/VICODIN) 5-325 MG per tablet 1 tablet (has no administration in time range)  promethazine (PHENERGAN) tablet 12.5 mg (has no administration in time range)  levalbuterol (XOPENEX) nebulizer solution 0.63 mg (has no administration in time range)  potassium chloride SA (K-DUR,KLOR-CON) CR tablet 20 mEq (has no administration in time range)  sodium chloride 0.9 % bolus 1,000 mL (0 mLs Intravenous Stopped 02/14/19 2036)  ipratropium-albuterol (DUONEB) 0.5-2.5 (3) MG/3ML nebulizer solution 3 mL (3 mLs Nebulization Given 02/14/19 1750)  albuterol (PROVENTIL HFA;VENTOLIN HFA) 108 (90 Base) MCG/ACT inhaler 2 puff (2 puffs Inhalation Given 02/14/19 1747)  dexamethasone (DECADRON) tablet 10 mg (10 mg Oral Given 02/14/19 1748)  metoprolol tartrate (LOPRESSOR) injection 5 mg (5 mg Intravenous Given 02/14/19 1956)     Initial Impression / Assessment and Plan / ED Course  I have reviewed the triage vital signs and the nursing notes.  Pertinent labs & imaging results that were available during my care of the patient were reviewed by me and considered in my medical decision making (see chart for details).        78 year old female with a history of hypertension, fibromyalgia, recent diagnosis of influenza by swab on Tuesday, who presents with concern for continued cough, generalized weakness, lightheadedness, decreased appetite.  She does describe one episode of productive cough with mucus tinged with blood, suspect in the setting of positive influenza test, influenza-like symptoms, that this episode represented influenza related bronchitis, and unlikely represents PE.  DDx includes continuing influenza symptoms/post-viral fatigue  and dehydration, pneumonia, arrhythmia, electrolyte abnormality, anemia, cardiac ischemia, CHF.   XR shows no sign of pneumonia. Labs show mild leuko/neutropenia. Troponin negative.  Given nebulizers for bronchitis and albuterol inhaler.  Initital HR in 90s-100. ECG with delay and was completed after receiving breathing treatments and shows atrial fibrillation with RVR.  Rate up to 160s.  Given metoprolol however continued to have labile heart rate up to 130s.  Started on diltiazem gtt for further rate control.  Discussed diagnosis of atrial fibrillation with patient. CHADSVASc 4 and discussed she will need anticoagulation.   Will admit for atrial fibrillation with RVR in the setting of influenza, bronchitis and dehydration.     Final Clinical Impressions(s) / ED Diagnoses   Final diagnoses:  Atrial fibrillation with RVR (HCC)  Influenza  Neutropenia associated with infection [facility])  Bronchitis    ED Discharge Orders    None       Alvira Monday, MD 02/15/19 [phone]

## 2019-02-15 ENCOUNTER — Other Ambulatory Visit: Payer: Self-pay

## 2019-02-15 DIAGNOSIS — I4891 Unspecified atrial fibrillation: Secondary | ICD-10-CM | POA: Diagnosis not present

## 2019-02-15 DIAGNOSIS — I10 Essential (primary) hypertension: Secondary | ICD-10-CM | POA: Diagnosis not present

## 2019-02-15 LAB — CBC WITH DIFFERENTIAL/PLATELET
Abs Immature Granulocytes: 0 10*3/uL (ref 0.00–0.07)
Basophils Absolute: 0 10*3/uL (ref 0.0–0.1)
Basophils Relative: 0 %
Eosinophils Absolute: 0 10*3/uL (ref 0.0–0.5)
Eosinophils Relative: 0 %
HCT: 39.6 % (ref 36.0–46.0)
Hemoglobin: 13.4 g/dL (ref 12.0–15.0)
Immature Granulocytes: 0 %
Lymphocytes Relative: 32 %
Lymphs Abs: 0.5 10*3/uL — ABNORMAL LOW (ref 0.7–4.0)
MCH: 30.6 pg (ref 26.0–34.0)
MCHC: 33.8 g/dL (ref 30.0–36.0)
MCV: 90.4 fL (ref 80.0–100.0)
Monocytes Absolute: 0.1 10*3/uL (ref 0.1–1.0)
Monocytes Relative: 4 %
Neutro Abs: 1.1 10*3/uL — ABNORMAL LOW (ref 1.7–7.7)
Neutrophils Relative %: 64 %
Platelets: 225 10*3/uL (ref 150–400)
RBC: 4.38 MIL/uL (ref 3.87–5.11)
RDW: 11.9 % (ref 11.5–15.5)
Smear Review: ADEQUATE
WBC: 1.7 10*3/uL — ABNORMAL LOW (ref 4.0–10.5)
nRBC: 0 % (ref 0.0–0.2)

## 2019-02-15 LAB — BASIC METABOLIC PANEL
Anion gap: 7 (ref 5–15)
BUN: 12 mg/dL (ref 8–23)
CO2: 21 mmol/L — ABNORMAL LOW (ref 22–32)
Calcium: 8.4 mg/dL — ABNORMAL LOW (ref 8.9–10.3)
Chloride: 107 mmol/L (ref 98–111)
Creatinine, Ser: 0.66 mg/dL (ref 0.44–1.00)
GFR calc Af Amer: >60 mL/min (ref >60)
GFR calc non Af Amer: >60 mL/min (ref >60)
Glucose, Bld: 211 mg/dL — ABNORMAL HIGH (ref 70–99)
Potassium: 3.7 mmol/L (ref 3.5–5.1)
Sodium: 135 mmol/L (ref 135–145)

## 2019-02-15 LAB — MAGNESIUM: Magnesium: 2 mg/dL (ref 1.7–2.4)

## 2019-02-15 LAB — TSH: TSH: 0.568 u[IU]/mL (ref 0.350–4.500)

## 2019-02-15 LAB — VITAMIN B12: Vitamin B-12: 1079 pg/mL — ABNORMAL HIGH (ref 180–914)

## 2019-02-15 LAB — HIV ANTIBODY (ROUTINE TESTING W REFLEX): HIV Screen 4th Generation wRfx: NONREACTIVE

## 2019-02-15 MED ORDER — MAGNESIUM HYDROXIDE 400 MG/5ML PO SUSP
30.0000 mL | Freq: Every day | ORAL | Status: DC | PRN
  Administered 2019-02-15: 30 mL via ORAL
  Filled 2019-02-15: qty 30

## 2019-02-15 MED ORDER — MECLIZINE HCL 25 MG PO TABS: 12.5000 mg | ORAL_TABLET | Freq: Every day | ORAL | Status: DC | PRN

## 2019-02-15 MED ORDER — GUAIFENESIN 100 MG/5ML PO SOLN: 5.0000 mL | ORAL | Status: DC | PRN

## 2019-02-15 MED ORDER — DILTIAZEM HCL ER COATED BEADS 120 MG PO CP24
120.0000 mg | ORAL_CAPSULE | Freq: Every day | ORAL | Status: DC
  Administered 2019-02-15 – 2019-02-17 (×3): 120 mg via ORAL
  Filled 2019-02-15 (×3): qty 1

## 2019-02-15 MED ORDER — APIXABAN 5 MG PO TABS
5.0000 mg | ORAL_TABLET | Freq: Two times a day (BID) | ORAL | Status: DC
  Administered 2019-02-15 – 2019-02-17 (×5): 5 mg via ORAL
  Filled 2019-02-15 (×5): qty 1

## 2019-02-15 NOTE — Consult Note (Signed)
CARDIOLOGY CONSULT NOTE  Patient ID: Phyllis Knight MRN: [id number] DOB/AGE: 79-Jul-1942 79 y.o.  Admit date: 02/14/2019 Referring Physician  [facility] Primary Physician:  Merri Brunette, MD Reason for Consultation  Atrial fibrillation  HPI:   78 y.o. African American female  with hypertension, fibromyalgia, recent Influenza-treated with Tamiflu by PCP starting 02/09/2019.  Patient presented to the emergency department on 02/14/2019 night with complaints of lightheadedness, palpitations.  Patient also reported exertional dyspnea and episode of blood-tinged sputum.  Work-up in the ED was significant for new diagnosis of atrial fibrillation, neutropenia on blood work.  She was started on diltiazem drip.  She currently has no symptoms of chest pain or palpitations.  Continues to have shortness of breath and dry cough.  Rate is better controlled on diltiazem drip.  Past Medical History:  Diagnosis Date  . Anemia    pt denies  . Arthritis   . Bradycardia   . Bronchitis    hx of   . Chest pain 06-10-14   had chest pain thia am  . Dizziness   . Fibromyalgia   . 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 GERD (gastroesophageal reflux disease)   . H/O hiatal hernia   . Hearing loss    left ear   . History of uterine cancer 1975   s/p hysterectomy  . Hypertension   . Numbness and tingling    hands and feet bilat   . Pneumonia yrs ago  . Shortness of breath dyspnea   . Syncope 07/16/2018  . Tinnitus   . Urge urinary incontinence   . Urinary incontinence   . Vitamin D deficiency      Past Surgical History:  Procedure Laterality Date  . CERVICAL FUSION  2011  . CHOLECYSTECTOMY N/A 06/14/2014   Procedure: LAPAROSCOPIC CHOLECYSTECTOMY WITH attempted INTRAOPERATIVE CHOLANGIOGRAM;  Surgeon: [name], MD;  Location: [facility];  Service: General;  Laterality: N/A;  . INSERTION OF MESH N/A 07/18/2015   Procedure: INSERTION OF MESH;  Surgeon: Ovidio Kin, MD;   Location: [facility];  Service: General;  Laterality: N/A;  . KNEE CLOSED REDUCTION Left 09/21/2013   Procedure: CLOSED MANIPULATION LEFT KNEE;  Surgeon: Shelda Pal, MD;  Location: [facility];  Service: Orthopedics;  Laterality: Left;  . TOTAL ABDOMINAL HYSTERECTOMY W/ BILATERAL SALPINGOOPHORECTOMY  1972  . TOTAL KNEE ARTHROPLASTY Left 08/03/2013   Procedure: LEFT TOTAL KNEE ARTHROPLASTY;  Surgeon: Shelda Pal, MD;  Location: [facility];  Service: Orthopedics;  Laterality: Left;  . tracheotomy    . TYMPANOPLASTY Left   . VENTRAL HERNIA REPAIR N/A 07/18/2015   Procedure: LAPAROSCOPIC VENTRAL AND UMBILICAL  HERNIA LYSIS OF ADHESIONS;  Surgeon: Ovidio Kin, MD;  Location: [facility];  Service: General;  Laterality: N/A;     Family History  Problem Relation Age of Onset  . Stroke Mother   . Stroke Father      Social History: Social History   Socioeconomic History  . Marital status: Married    Spouse name: Not on file  . Number of children: Not on file  . Years of education: Not on file  . Highest education level: Not on file  Occupational History  . Not on file  Social Needs  . Financial resource strain: Not on file  . Food insecurity:    Worry: Not on file    Inability: Not on file  . Transportation needs:    Medical: Not on  file    Non-medical: Not on file  Tobacco Use  . Smoking status: Never Smoker  . Smokeless tobacco: Never Used  Substance and Sexual Activity  . Alcohol use: No  . Drug use: No  . Sexual activity: Not on file  Lifestyle  . Physical activity:    Days per week: Not on file    Minutes per session: Not on file  . Stress: Not on file  Relationships  . Social connections:    Talks on phone: Not on file    Gets together: Not on file    Attends religious service: Not on file    Active member of club or organization: Not on file    Attends meetings of clubs or organizations: Not on file    Relationship status: Not on file  . Intimate partner  violence:    Fear of current or ex partner: Not on file    Emotionally abused: Not on file    Physically abused: Not on file    Forced sexual activity: Not on file  Other Topics Concern  . Not on file  Social History Narrative  . Not on file     Medications Prior to Admission  Medication Sig Dispense Refill Last Dose  . acetaminophen (TYLENOL) 500 MG tablet Take 500 mg by mouth every 6 (six) hours as needed for mild pain, moderate pain, fever or headache.    02/13/2019 at pm  . albuterol (PROVENTIL HFA;VENTOLIN HFA) 108 (90 Base) MCG/ACT inhaler Inhale 2 puffs into the lungs every 4 (four) hours as needed for wheezing or shortness of breath.    02/13/2019 at pm  . amLODipine (NORVASC) 10 MG tablet Take 10 mg by mouth daily.    02/13/2019 at am  . aspirin EC 81 MG tablet Take 81 mg by mouth daily.   02/13/2019 at am  . benazepril (LOTENSIN) 20 MG tablet Take 20 mg by mouth daily.    02/13/2019 at am  . cholecalciferol (VITAMIN D) 1000 units tablet Take 2,000 Units by mouth daily.    02/13/2019 at am  . oseltamivir (TAMIFLU) 75 MG capsule Take 75 mg by mouth at bedtime.   02/13/2019 at pm  . OVER THE COUNTER MEDICATION Apply 1 application topically daily as needed (itching). OTC cream for itching   02/13/2019 at pm  . OVER THE COUNTER MEDICATION Take 1 tablet by mouth daily. otc medication for dizziness   02/13/2019 at am  . potassium chloride (KLOR-CON) 8 MEQ tablet Take 8 mEq by mouth daily.    02/13/2019 at am    Review of Systems  Constitution: Negative for decreased appetite, malaise/fatigue, weight gain and weight loss.  HENT: Negative for congestion.   Eyes: Negative for visual disturbance.  Cardiovascular: Positive for dyspnea on exertion and palpitations. Negative for chest pain, leg swelling and syncope.  Respiratory: Positive for cough and hemoptysis (1 reported episode). Negative for shortness of breath.   Endocrine: Negative for cold intolerance.  Hematologic/Lymphatic: Does not  bruise/bleed easily.  Skin: Negative for itching and rash.  Musculoskeletal: Negative for myalgias.  Gastrointestinal: Negative for abdominal pain, nausea and vomiting.  Genitourinary: Negative for dysuria.  Neurological: Positive for light-headedness. Negative for dizziness and weakness.  Psychiatric/Behavioral: The patient is not nervous/anxious.   All other systems reviewed and are negative.     Physical Exam: Physical Exam  Constitutional: She is oriented to person, place, and time. She appears well-developed and well-nourished. No distress.  HENT:  Head: Normocephalic and  atraumatic.  Eyes: Pupils are equal, round, and reactive to light. Conjunctivae are normal.  Neck: No JVD present.  Cardiovascular: Normal rate and intact distal pulses. An irregularly irregular rhythm present.  No murmur heard. Pulmonary/Chest: Effort normal. She has wheezes. She has no rales.  Abdominal: Soft. Bowel sounds are normal. There is no rebound.  Musculoskeletal:        General: No edema.  Lymphadenopathy:    She has no cervical adenopathy.  Neurological: She is alert and oriented to person, place, and time. No cranial nerve deficit.  Skin: Skin is warm and dry.  Psychiatric: She has a normal mood and affect.  Nursing note and vitals reviewed.    Labs:   Lab Results  Component Value Date   WBC 1.7 (L) 02/15/2019   HGB 13.4 02/15/2019   HCT 39.6 02/15/2019   MCV 90.4 02/15/2019   PLT 225 02/15/2019    Recent Labs  Lab 02/14/19 1725 02/15/19 0044  NA 135 135  K 3.8 3.7  CL 103 107  CO2 24 21*  BUN 14 12  CREATININE 0.80 0.66  CALCIUM 8.5* 8.4*  PROT 7.1  --   BILITOT 1.1  --   ALKPHOS 58  --   ALT 18  --   AST 30  --   GLUCOSE 91 211*    Lipid Panel  No results found for: CHOL, TRIG, HDL, CHOLHDL, VLDL, LDLCALC  BNP (last 3 results) Recent Labs    02/14/19 1726  BNP 84.2    HEMOGLOBIN A1C No results found for: HGBA1C, MPG  Cardiac Panel (last 3 results) No  results for input(s): CKTOTAL, CKMB, TROPONINI, RELINDX in the last 8760 hours.  No results found for: CKTOTAL, CKMB, CKMBINDEX, TROPONINI   TSH Recent Labs    02/15/19 0044  TSH 0.568      Radiology: Dg Chest 2 View  Result Date: 02/14/2019 CLINICAL DATA:  Flu.  Coughing up blood tinged sputum. EXAM: CHEST - 2 VIEW COMPARISON:  July 17, 2018 FINDINGS: Linear platelike opacities in the lateral left lower lung are likely atelectasis. A tortuous thoracic aorta is stable. The heart, hila, and mediastinum are unchanged. No pneumothorax. No nodules or masses. No focal infiltrates. IMPRESSION: Mild left basilar atelectasis. No acute infiltrate or other abnormality. Electronically Signed   By: Gerome Sam III M.D   On: 02/14/2019 16:21    Scheduled Meds: . aspirin EC  81 mg Oral Daily  . enoxaparin (LOVENOX) injection  40 mg Subcutaneous Daily  . sodium chloride flush  3 mL Intravenous Q12H  . sodium chloride flush  3 mL Intravenous Q12H   Continuous Infusions: . sodium chloride    . diltiazem (CARDIZEM) infusion 5 mg/hr (02/14/19 2206)   PRN Meds:.sodium chloride, acetaminophen **OR** acetaminophen, guaiFENesin, HYDROcodone-acetaminophen, levalbuterol, promethazine, sodium chloride flush  CARDIAC STUDIES:  EKG 02/14/2019: A. fib with RVR.  No ischemic changes.  Echocardiogram pending:   Assessment & Recommendations:  78 y.o. African American female  with hypertension, fibromyalgia, recent Influenza-treated with Tamiflu by PCP starting 02/09/2019,, now with Afib RVR  Afib RVR: Onset unclear.  Currently on diltiazem drip. Rate improved. CHA2DS2VASc score 4. Annual stroke risk 5. Recommend Eliquis 5 mg bid. Stop Aspirin.  Add PO diltiazem 120 mg daily.  Can wean off diltiazem IV drip  later today.   Echocardiogram pending. She is currently afebrile. [place] in [place] with her husband with no recent travel or exposure to sick contact. However, I would have low  threshold to check the patient for coronavirus if her wheezing and neutropenia continue to be an issue.    Elder Negus, MD 02/15/2019, 9:58 AM [facility] Pager: [phone] Office: [phone] If no answer Cell [phone]

## 2019-02-15 NOTE — Care Management Obs Status (Signed)
MEDICARE OBSERVATION STATUS NOTIFICATION   Patient Details  Name: Phyllis Knight MRN: [id number] Date of Birth: 29-Jun-1941   Medicare Observation Status Notification Given:  Yes    Gala Lewandowsky, RN 02/15/2019, 4:53 PM

## 2019-02-15 NOTE — Discharge Instructions (Signed)

## 2019-02-15 NOTE — Progress Notes (Signed)
PROGRESS NOTE    Phyllis Knight  FFM:[id number] DOB: 06-Sep-1941 DOA: 02/14/2019 PCP: Merri Brunette, MD   Brief Narrative: 78 year old female with history of hypertension, fibromyalgia, recent influenza diagnosed on 3/10 and on Tamiflu presented to ER with complaint of lightheadedness, exertional dyspnea and bloody sputum. Patient had cough body aches fever chills which has since improved. In the ER found to have new onset A. fib with RVR heart rate in 130s, initiated on Cardizem drip.  We will continue work-up with chest x-ray mild left basilar atelectasis only, no pneumonia, CBC with neutropenia, normal BNP. Patient admitted, cardiology consulted.  Subjective: Resting comfortably.  Some cough.  Afebrile.  Denies any palpitation lightheadedness chest pain or shortness of breath. Heart rate was going in 120s with minimal exertion this am.  Assessment & Plan:  Atrial fibrillation with RVR: onset unclear. TSH normal.Rate poorly controlled, on Cardizem drip, initiating p.o. Cardizem, chads vas score is at least 4, so initiating Eliquis for stroke prevention, stop asa. Follow-up echocardiogram.  Recent influenza and bronchospasm, continue droplet precaution.  Continue supportive care antitussive,  Neutropenia: Likely from her influenza and Tamiflu.  Patient currently afebrile, no recent travel or exposure to sick contact, low suspicion for corona virus. Wbc 1700, ANC at 1100, lymphocyte at 500. Monitor cbc  Hypertension: Blood pressure is stable, monitor while initiating Cardizem..  Fibromyalgia: No issues currently.   DVT prophylaxis: Initiating Eliquis  Code Status: Code Family Communication: family at bedside Disposition Plan: remains inhouse pending clinical improvement.   Consultants:  Cardiology Procedures: TTE: Pending  Antimicrobials: Anti-infectives (From admission, onward)   None       Objective: Vitals:   02/14/19 2300 02/14/19 2330 02/15/19 0047 02/15/19  0458  BP: 109/84 110/88 110/83 (!) 108/91  Pulse: (!) 104 96 90 (!) 101  Resp: (!) 27 (!) 22  (!) 22  Temp:   98.9 F (37.2 C) 98.8 F (37.1 C)  TempSrc:   Oral Oral  SpO2: 96% 98% 97%   Weight:    97.1 kg  Height:        Intake/Output Summary (Last 24 hours) at 02/15/2019 1127 Last data filed at 02/15/2019 0836 Gross per 24 hour  Intake 1405.29 ml  Output -  Net 1405.29 ml   Filed Weights   02/14/19 1505 02/15/19 0458  Weight: 97.1 kg 97.1 kg   Weight change:   Body mass index is 34.54 kg/m.  Intake/Output from previous day: 03/15 0701 - 03/16 0700 In: 1165.3 [P.O.:120; I.V.:45.3; IV Piggyback:1000] Out: -  Intake/Output this shift: Total I/O In: 240 [P.O.:240] Out: -   Examination:  General exam: Appears calm and comfortable,Not in distress, older fore the age HEENT:PERRL,Oral mucosa moist, Ear/Nose normal on gross exam Respiratory system: Bilateral air entry, no wheezing or crackles  Cardiovascular system: S1 & S2 heard,No JVD, murmurs.  Irregular irregular and tachycardic. Gastrointestinal system: Abdomen is  soft, non tender, non distended, BS +  Nervous System:Alert and oriented. No focal neurological deficits/moving extremities, sensation intact. Extremities: No edema, no clubbing, distal peripheral pulses palpable. Skin: No rashes, lesions, no icterus MSK: Normal muscle bulk,tone ,power  Medications:  Scheduled Meds: . apixaban  5 mg Oral BID  . diltiazem  120 mg Oral Daily  . sodium chloride flush  3 mL Intravenous Q12H  . sodium chloride flush  3 mL Intravenous Q12H   Continuous Infusions: . sodium chloride    . diltiazem (CARDIZEM) infusion 5 mg/hr (02/14/19 2206)    Data Reviewed: I  have personally reviewed following labs and imaging studies  CBC: Recent Labs  Lab 02/14/19 1725 02/15/19 0044  WBC 2.4* 1.7*  NEUTROABS 0.7* 1.1*  HGB 14.0 13.4  HCT 42.6 39.6  MCV 93.2 90.4  PLT 210 225   Basic Metabolic Panel: Recent Labs  Lab  02/14/19 1725 02/15/19 0044  NA 135 135  K 3.8 3.7  CL 103 107  CO2 24 21*  GLUCOSE 91 211*  BUN 14 12  CREATININE 0.80 0.66  CALCIUM 8.5* 8.4*  MG  --  2.0   GFR: Estimated Creatinine Clearance: 68.1 mL/min (by C-G formula based on SCr of 0.66 mg/dL). Liver Function Tests: Recent Labs  Lab 02/14/19 1725  AST 30  ALT 18  ALKPHOS 58  BILITOT 1.1  PROT 7.1  ALBUMIN 3.4*   No results for input(s): LIPASE, AMYLASE in the last 168 hours. No results for input(s): AMMONIA in the last 168 hours. Coagulation Profile: No results for input(s): INR, PROTIME in the last 168 hours. Cardiac Enzymes: No results for input(s): CKTOTAL, CKMB, CKMBINDEX, TROPONINI in the last 168 hours. BNP (last 3 results) No results for input(s): PROBNP in the last 8760 hours. HbA1C: No results for input(s): HGBA1C in the last 72 hours. CBG: No results for input(s): GLUCAP in the last 168 hours. Lipid Profile: No results for input(s): CHOL, HDL, LDLCALC, TRIG, CHOLHDL, LDLDIRECT in the last 72 hours. Thyroid Function Tests: Recent Labs    02/15/19 0044  TSH 0.568   Anemia Panel: Recent Labs    02/15/19 0044  VITAMINB12 1,079*   Sepsis Labs: No results for input(s): PROCALCITON, LATICACIDVEN in the last 168 hours.  No results found for this or any previous visit (from the past 240 hour(s)).    Radiology Studies: Dg Chest 2 View  Result Date: 02/14/2019 CLINICAL DATA:  Flu.  Coughing up blood tinged sputum. EXAM: CHEST - 2 VIEW COMPARISON:  July 17, 2018 FINDINGS: Linear platelike opacities in the lateral left lower lung are likely atelectasis. A tortuous thoracic aorta is stable. The heart, hila, and mediastinum are unchanged. No pneumothorax. No nodules or masses. No focal infiltrates. IMPRESSION: Mild left basilar atelectasis. No acute infiltrate or other abnormality. Electronically Signed   By: Gerome Sam III M.D   On: 02/14/2019 16:21      LOS: 0 days   Time spent: More than  50% of that time was spent in counseling and/or coordination of care.  Lanae Boast, MD [facility]  02/15/2019, 11:27 AM

## 2019-02-16 ENCOUNTER — Observation Stay (HOSPITAL_COMMUNITY): Payer: Medicare Other

## 2019-02-16 DIAGNOSIS — J111 Influenza due to unidentified influenza virus with other respiratory manifestations: Secondary | ICD-10-CM | POA: Diagnosis present

## 2019-02-16 DIAGNOSIS — Z90722 Acquired absence of ovaries, bilateral: Secondary | ICD-10-CM | POA: Diagnosis not present

## 2019-02-16 DIAGNOSIS — E559 Vitamin D deficiency, unspecified: Secondary | ICD-10-CM | POA: Diagnosis present

## 2019-02-16 DIAGNOSIS — Z8542 Personal history of malignant neoplasm of other parts of uterus: Secondary | ICD-10-CM | POA: Diagnosis not present

## 2019-02-16 DIAGNOSIS — R0602 Shortness of breath: Secondary | ICD-10-CM | POA: Diagnosis present

## 2019-02-16 DIAGNOSIS — Z96652 Presence of left artificial knee joint: Secondary | ICD-10-CM | POA: Diagnosis present

## 2019-02-16 DIAGNOSIS — R042 Hemoptysis: Secondary | ICD-10-CM | POA: Diagnosis present

## 2019-02-16 DIAGNOSIS — Z981 Arthrodesis status: Secondary | ICD-10-CM | POA: Diagnosis not present

## 2019-02-16 DIAGNOSIS — Z90710 Acquired absence of both cervix and uterus: Secondary | ICD-10-CM | POA: Diagnosis not present

## 2019-02-16 DIAGNOSIS — M797 Fibromyalgia: Secondary | ICD-10-CM | POA: Diagnosis present

## 2019-02-16 DIAGNOSIS — K920 Hematemesis: Secondary | ICD-10-CM | POA: Diagnosis present

## 2019-02-16 DIAGNOSIS — H9190 Unspecified hearing loss, unspecified ear: Secondary | ICD-10-CM | POA: Diagnosis present

## 2019-02-16 DIAGNOSIS — I10 Essential (primary) hypertension: Secondary | ICD-10-CM | POA: Diagnosis present

## 2019-02-16 DIAGNOSIS — Z7982 Long term (current) use of aspirin: Secondary | ICD-10-CM | POA: Diagnosis not present

## 2019-02-16 DIAGNOSIS — I4891 Unspecified atrial fibrillation: Secondary | ICD-10-CM | POA: Diagnosis present

## 2019-02-16 DIAGNOSIS — Z79899 Other long term (current) drug therapy: Secondary | ICD-10-CM | POA: Diagnosis not present

## 2019-02-16 DIAGNOSIS — K219 Gastro-esophageal reflux disease without esophagitis: Secondary | ICD-10-CM | POA: Diagnosis present

## 2019-02-16 DIAGNOSIS — J9801 Acute bronchospasm: Secondary | ICD-10-CM | POA: Diagnosis present

## 2019-02-16 LAB — CBC WITH DIFFERENTIAL/PLATELET
Band Neutrophils: 0 %
Basophils Absolute: 0 10*3/uL (ref 0.0–0.1)
Basophils Relative: 0 %
Blasts: 0 %
Eosinophils Absolute: 0 10*3/uL (ref 0.0–0.5)
Eosinophils Relative: 0 %
HCT: 37.4 % (ref 36.0–46.0)
Hemoglobin: 12.4 g/dL (ref 12.0–15.0)
Lymphocytes Relative: 38 %
Lymphs Abs: 1.8 10*3/uL (ref 0.7–4.0)
MCH: 30.7 pg (ref 26.0–34.0)
MCHC: 33.2 g/dL (ref 30.0–36.0)
MCV: 92.6 fL (ref 80.0–100.0)
Metamyelocytes Relative: 0 %
Monocytes Absolute: 0.5 10*3/uL (ref 0.1–1.0)
Monocytes Relative: 10 %
Myelocytes: 0 %
Neutro Abs: 2.5 10*3/uL (ref 1.7–7.7)
Neutrophils Relative %: 52 %
Other: 0 %
Platelets: 216 10*3/uL (ref 150–400)
Promyelocytes Relative: 0 %
RBC: 4.04 MIL/uL (ref 3.87–5.11)
RDW: 11.9 % (ref 11.5–15.5)
WBC: 4.8 10*3/uL (ref 4.0–10.5)
nRBC: 0 % (ref 0.0–0.2)
nRBC: 0 /100 WBC

## 2019-02-16 LAB — FOLATE RBC
Folate, Hemolysate: 384 ng/mL
Folate, RBC: 939 ng/mL (ref >498)
Hematocrit: 40.9 % (ref 34.0–46.6)

## 2019-02-16 LAB — ECHOCARDIOGRAM COMPLETE
Height: 66 in
Weight: 3424.01 oz

## 2019-02-16 LAB — PATHOLOGIST SMEAR REVIEW

## 2019-02-16 MED ORDER — DILTIAZEM HCL 60 MG PO TABS: 30.0000 mg | ORAL_TABLET | Freq: Four times a day (QID) | ORAL | Status: DC | PRN

## 2019-02-16 MED ORDER — FUROSEMIDE 10 MG/ML IJ SOLN
20.0000 mg | Freq: Once | INTRAMUSCULAR | Status: AC
  Administered 2019-02-16: 20 mg via INTRAVENOUS
  Filled 2019-02-16: qty 2

## 2019-02-16 MED ORDER — DM-GUAIFENESIN ER 30-600 MG PO TB12
1.0000 | ORAL_TABLET | Freq: Two times a day (BID) | ORAL | Status: DC
  Administered 2019-02-16 – 2019-02-17 (×3): 1 via ORAL
  Filled 2019-02-16 (×2): qty 1

## 2019-02-16 MED ORDER — PERFLUTREN LIPID MICROSPHERE
1.0000 mL | INTRAVENOUS | Status: AC | PRN
  Administered 2019-02-16: 3 mL via INTRAVENOUS
  Filled 2019-02-16: qty 10

## 2019-02-16 NOTE — Progress Notes (Addendum)
Subjective:  Chest pain improved Still has wheezing Reported blood tinged sputum  Objective:  Vital Signs in the last 24 hours: Temp:  [98 F (36.7 C)-98.3 F (36.8 C)] 98 F (36.7 C) (03/17 0600) Pulse Rate:  [81-83] 81 (03/17 0600) Resp:  [16-18] 16 (03/17 0600) BP: (94-114)/(73-76) 94/73 (03/17 0600) SpO2:  [96 %-98 %] 96 % (03/17 0600)  Intake/Output from previous day: 03/16 0701 - 03/17 0700 In: 752.6 [P.O.:720; I.V.:32.6] Out: 1050 [Urine:1050]  Physical Exam  Pulmonary/Chest: She has rales (Left lower lobe).   Constitutional: She is oriented to person, place, and time. She appears well-developed and well-nourished. No distress.  HENT:  Head: Normocephalic and atraumatic.  Eyes: Pupils are equal, round, and reactive to light. Conjunctivae are normal.  Neck: No JVD present.  Cardiovascular: Normal rate and intact distal pulses. An irregularly irregular rhythm present.  No murmur heard. Pulmonary/Chest: Effort normal. She has wheezes. She has no rales.  Abdominal: Soft. Bowel sounds are normal. There is no rebound.  Musculoskeletal:        General: No edema.  Lymphadenopathy:    She has no cervical adenopathy.  Neurological: She is alert and oriented to person, place, and time. No cranial nerve deficit.  Skin: Skin is warm and dry.  Psychiatric: She has a normal mood and affect.  Nursing note and vitals reviewed.  Lab Results: BMP Recent Labs    07/17/18 0017 02/14/19 1725 02/15/19 0044  NA 141 135 135  K 3.8 3.8 3.7  CL 108 103 107  CO2  --  24 21*  GLUCOSE 87 91 211*  BUN 11 14 12   CREATININE 0.50 0.80 0.66  CALCIUM  --  8.5* 8.4*  GFRNONAA  --  >60 >60  GFRAA  --  >60 >60    CBC Recent Labs  Lab 02/16/19 0326  WBC 4.8  RBC 4.04  HGB 12.4  HCT 37.4  PLT 216  MCV 92.6  MCH 30.7  MCHC 33.2  RDW 11.9  LYMPHSABS 1.8  MONOABS 0.5  EOSABS 0.0  BASOSABS 0.0    HEMOGLOBIN A1C No results found for: HGBA1C, MPG  Cardiac Panel (last 3  results) No results for input(s): CKTOTAL, CKMB, TROPONINI, RELINDX in the last 8760 hours.  BNP (last 3 results) Recent Labs    02/14/19 1726  BNP 84.2    TSH Recent Labs    02/15/19 0044  TSH 0.568    Lipid Panel  No results found for: CHOL, TRIG, HDL, CHOLHDL, VLDL, LDLCALC, LDLDIRECT   Hepatic Function Panel Recent Labs    02/14/19 1725  PROT 7.1  ALBUMIN 3.4*  AST 30  ALT 18  ALKPHOS 58  BILITOT 1.1    Cardiac Studies:  EKG 02/14/2019: A. fib with RVR.  No ischemic changes.  Echocardiogram 02/16/2019:  1. The left ventricle has normal systolic function with an ejection fraction of 60-65%. The cavity size was normal. There is mild concentric left ventricular hypertrophy. Diastolic function could not be assessed due to atrial fibrillation.  2. Low normal RV systolic function.  3. Mild left atrial dilatation. Moderate right atrial dilatation.  4. Mild to moderate mitral and tricuspid regurgitation. RVSP 29 mmHg.  5. Vavlular regurgitation and chamber dilatation new since previous echocardiogram on 07/17/2018.   Assessment & Recommendations:  78 y.o. African American female  with hypertension, fibromyalgia, recent Influenza-treated with Tamiflu by PCP starting 02/09/2019,, now with Afib RVR  Afib RVR: Onset unclear. Echocardiogram with atrial dilatation suggests this may not  be acute. Exacerbated by recent Influenza infection. Avoiding beta blocker given severe bronchospasm Continue diltiazem 120 mg daily. Added 30 mg q6 hr prn CHA2DS2VASc score 4. Annual stroke risk 5%. Continue Eliquis 5 mg bid.  Minor hemoptysis likely as a result of her influenza bronchitis. Monitor for severe bleeding.  Consider IV lasix 20 mg. Given that she is still recovering from post influenza bronchitis, I do not recommend cardioversion at this time. Continue rate control for now. Cardioversion can be considered outpatient after 3-4 weeks on anticoagulation.    Elder Negus, M.D. 02/16/2019, 1:03 PM [facility] Pager: [phone] Office: [phone] If no answer: [phone]

## 2019-02-16 NOTE — TOC Benefit Eligibility Note (Signed)
Transition of Care (TOC) Benefit Eligibility Note    Patient Details  Name: Phyllis Knight MRN: [id number] Date of Birth: 03/11/1941   Medication/Dose: 5mg 2x day  Covered?: Yes(Eliquis covered Apixaban not)     Prescription Coverage Preferred Pharmacy: [facility], [facility]  Spoke with Person/Company/Phone Number:: [employer]/ [employer] [phone]  Co-Pay: local pharmacy $102.00 90 day mail order $186.20  Prior Approval: No  Deductible: Met       [name] Phone Number: 02/16/2019, 11:59 AM     

## 2019-02-16 NOTE — TOC Benefit Eligibility Note (Signed)
Transition of Care [facility]) Benefit Eligibility Note    Patient Details  Name: Phyllis Knight MRN: [id number] Date of Birth: 21-May-1941   Medication/Dose: 46m 2x day  Covered?: Yes(Eliquis covered Apixaban not)     Prescription Coverage [facility]erred Pharmacy: [facility]  Spoke with Person/Company/Phone Number:: [employer]/ [employer] [phone] Co-Pay: local pharmacy $102.00 90 day mail order $186.20  Prior Approval: No  Deductible: Met       FKerin SalenPhone Number: 02/16/2019, 11:59 AM

## 2019-02-16 NOTE — TOC Benefit Eligibility Note (Signed)
Transition of Care [facility]) Benefit Eligibility Note    Patient Details  Name: Phyllis Knight MRN: [id number] Date of Birth: [birth year]                                Caren Macadam Phone Number: 02/16/2019, 11:55 AM

## 2019-02-16 NOTE — Progress Notes (Signed)
Physical Therapy Evaluation Patient Details Name: Phyllis Knight MRN: [id number] DOB: Nov 20, 1941 Today's Date: 02/16/2019   History of Present Illness  Patient is 78 y/o female presenting to hospital exertional dyspnea, bloody sputum, and fever and chills secondary to influenza. Patient also found to have new onset afib upon arrival to ED. PMH includes HTN, fibromyalgia, and syncope.   Clinical Impression  Patient admitted to hospital secondary to problems above and with deficits below. Patient required min guard to stand and ambulate short distance in room with RW. HR increased to high 120s during ambulation and returned to low 100s with standing rest break. Patient denied any symptoms. HR returned to 80s at end of session. Educated patient about energy conservation techniques at home following d/c. Given functional mobility deficits, recommending HHPT following d/c. Patient will benefit from acute physical therapy to maximize independence and safety with functional mobility.     Follow Up Recommendations Home health PT;Supervision for mobility/OOB    Equipment Recommendations  None recommended by PT    Recommendations for Other Services       Precautions / Restrictions Precautions Precautions: Other (comment) Precaution Comments: monitor HR; target HR range 65-105 bpm Restrictions Weight Bearing Restrictions: No      Mobility  Bed Mobility Overal bed mobility: Needs Assistance Bed Mobility: Supine to Sit;Sit to Supine     Supine to sit: Supervision Sit to supine: Supervision   General bed mobility comments: Patient required supervision for all bed mobility for safety.   Transfers Overall transfer level: Needs assistance Equipment used: Rolling walker (2 wheeled) Transfers: Sit to/from [employer] Sit to Stand: Min guard Stand pivot transfers: Min guard       General transfer comment: Patient required min guard to stand x3 with RW for safety. Verbal  cues for safe hand placement when using RW. Required min guard for safety with RW to complete stand pivot transfer to [facility].   Ambulation/Gait Ambulation/Gait assistance: Min guard Gait Distance (Feet): 15 Feet Assistive device: Rolling walker (2 wheeled) Gait Pattern/deviations: Step-through pattern;Decreased stride length Gait velocity: decreased Gait velocity interpretation: <1.8 ft/sec, indicate of risk for recurrent falls General Gait Details: Patient ambulated short distance in room with min guard and use of RW for safety. Required verbal cues for sequencing with RW. HR increased to high 120s during ambulation and returned to low 100s with standing rest break. Patient denies any symptoms. HR in 80s at end of session. Educated about energy conservation techniques following d/c.  Stairs            Wheelchair Mobility    Modified Rankin (Stroke Patients Only)       Balance Overall balance assessment: Needs assistance Sitting-balance support: Feet supported;No upper extremity supported Sitting balance-Leahy Scale: Good     Standing balance support: Bilateral upper extremity supported Standing balance-Leahy Scale: Poor Standing balance comment: reliant on BUE support to maintain standing balance                             Pertinent Vitals/Pain Pain Assessment: Faces Faces Pain Scale: No hurt    Home Living Family/patient expects to be discharged to:: Private residence Living Arrangements: Spouse/significant other Available Help at Discharge: Family;Available 24 hours/day Type of Home: House Home Access: Level entry     Home Layout: One level Home Equipment: Cane - single point;Walker - 2 wheels;Bedside commode;Toilet riser;Shower seat      Prior Function Level of Independence: Independent  with assistive device(s)         Comments: Reports using a cane and occasionally a RW for mobility     Hand Dominance        Extremity/Trunk Assessment    Upper Extremity Assessment Upper Extremity Assessment: Overall WFL for tasks assessed    Lower Extremity Assessment Lower Extremity Assessment: Generalized weakness    Cervical / Trunk Assessment Cervical / Trunk Assessment: Normal  Communication   Communication: No difficulties  Cognition Arousal/Alertness: Awake/alert Behavior During Therapy: WFL for tasks assessed/performed Overall Cognitive Status: Within Functional Limits for tasks assessed                                        General Comments General comments (skin integrity, edema, etc.): Patient reports feeling comfortable at home with functional mobility. States husband can provide necessary supervision/assistance    Exercises     Assessment/Plan    PT Assessment Patient needs continued PT services  PT Problem List Decreased strength;Decreased activity tolerance;Decreased balance;Decreased mobility;Decreased knowledge of use of DME       PT Treatment Interventions DME instruction;Gait training;Functional mobility training;Therapeutic activities;Therapeutic exercise;Balance training;Patient/family education    PT Goals (Current goals can be found in the Care Plan section)  Acute Rehab PT Goals Patient Stated Goal: go home PT Goal Formulation: With patient Time For Goal Achievement: 03/02/19 Potential to Achieve Goals: Good    Frequency Min 3X/week   Barriers to discharge        Co-evaluation               AM-PAC PT "6 Clicks" Mobility  Outcome Measure Help needed turning from your back to your side while in a flat bed without using bedrails?: A Little Help needed moving from lying on your back to sitting on the side of a flat bed without using bedrails?: A Little Help needed moving to and from a bed to a chair (including a wheelchair)?: A Little Help needed standing up from a chair using your arms (e.g., wheelchair or bedside chair)?: A Little Help needed to walk in hospital  room?: A Little Help needed climbing 3-5 steps with a railing? : A Lot 6 Click Score: 17    End of Session Equipment Utilized During Treatment: Gait belt Activity Tolerance: Treatment limited secondary to medical complications (Comment)(HR out of target range) Patient left: in bed;with call bell/phone within reach Nurse Communication: Mobility status PT Visit Diagnosis: Other abnormalities of gait and mobility (R26.89);Muscle weakness (generalized) (M62.81);Difficulty in walking, not elsewhere classified (R26.2)    Time: 6579-0383 PT Time Calculation (min) (ACUTE ONLY): 28 min   Charges:   PT Evaluation $PT Eval Moderate Complexity: 1 Mod PT Treatments $Therapeutic Activity: 8-22 mins        Phyllis Knight, SPT  Phyllis Knight 02/16/2019, 12:18 PM

## 2019-02-16 NOTE — Progress Notes (Signed)
PROGRESS NOTE    Phyllis Knight  FWY:[id number] DOB: 02-17-1941 DOA: 02/14/2019 PCP: Merri Brunette, MD   Brief Narrative: 78 year old female with history of hypertension, fibromyalgia, recent influenza diagnosed on 3/10 and on Tamiflu presented to ER with complaint of lightheadedness, exertional dyspnea and bloody sputum. Patient had cough body aches fever chills which has since improved. In the ER found to have new onset A. fib with RVR heart rate in 130s, initiated on Cardizem drip.  We will continue work-up with chest x-ray mild left basilar atelectasis only, no pneumonia, CBC with neutropenia, normal BNP. Patient was admitted, cardiology consulted. Patient remains in A. fib, rate fairly controlled on p.o. Cardizem, initiated on Eliquis.  Subjective: Resting on room air.  Remains in A. fib, ambulatory with a physical therapy and heart rate was in 120s but went back down to less than 100 after rest.also had a mild episode of hematemesis.  Having intermittent cough.  Assessment & Plan:  Atrial fibrillation with YDX:AJOIN unclear. She was symptomatic with lightheadedness on presentation. TSH normal. Echo with normal EF, but moderately atrial dilatation. Off Cardizem drip 3/16 and on p.o. Cardizem.  Blood pressure soft, monitor closely.  Added PRN diltiazem for heart rate less than more than 130, that she will be able to take even at home. CHADS2 VASC score:4, so initiated Eliquis for stroke prevention.  Cough with mild crackles at base, we will try  Iv lasix x1 to see if it improves.  Continue supportive care, hydration influenza.added antitussuves.  Mild hemoptysis: Likely in the setting of bronchospasm and cough.  But will monitor overnight to make sure she is tolerating Eliquis.  Recent influenza and bronchospasm, continue droplet precaution.  Continue supportive care antitussive,  Neutropenia/leukopenia:Likely from her influenza and Tamiflu.  CBC this morning is improved.    Hypertension:Blood pressure is soft. On d/c will only on cardizem.  Fibromyalgia:No issues currently.  DVT prophylaxis: Eliquis  Code Status: Code Family Communication: family at bedside Disposition Plan: remains inhouse pending clinical improvement.  Patient will need to be monitored for additional night to make sure bronchospasm crackles improves and does not have worsening hemoptysis on Eliquis.  I discussed with Dr Rosemary Holms who agrees with the plan.  Consultants:  Cardiology.  Procedures: TTE:   1. The left ventricle has normal systolic function with an ejection fraction of 60-65%. The cavity size was normal. There is mild concentric left ventricular hypertrophy. Diastolic function could not be assessed due to atrial fibrillation.  2. Low normal RV systolic function.  3. Mild left atrial dilatation. Moderate right atrial dilatation.  4. Mild to moderate mitral and tricuspid regurgitation. RVSP 29 mmHg.  5. Vavlular regurgitation   Antimicrobials: Anti-infectives (From admission, onward)   None     Objective: Vitals:   02/15/19 0458 02/15/19 1300 02/15/19 2008 02/16/19 0600  BP: (!) 108/91 108/71 114/76 94/73  Pulse: (!) 101 97 83 81  Resp: (!) 22 20 18 16   Temp: 98.8 F (37.1 C) 98.1 F (36.7 C) 98.3 F (36.8 C) 98 F (36.7 C)  TempSrc: Oral Oral Oral Oral  SpO2:  97% 98% 96%  Weight: 97.1 kg     Height:        Intake/Output Summary (Last 24 hours) at 02/16/2019 1346 Last data filed at 02/16/2019 0700 Gross per 24 hour  Intake 512.64 ml  Output 1050 ml  Net -537.36 ml   Filed Weights   02/14/19 1505 02/15/19 0458  Weight: 97.1 kg 97.1 kg  Weight change:   Body mass index is 34.54 kg/m.  Intake/Output from previous day: 03/16 0701 - 03/17 0700 In: 752.6 [P.O.:720; I.V.:32.6] Out: 1050 [Urine:1050] Intake/Output this shift: No intake/output data recorded.  Examination:  General exam: Appears calm and comfortable,Not in distress, older fore the age  HEENT:PERRL,Oral mucosa moist, Ear/Nose normal on gross exam Respiratory system: Bilateral air entry, no wheezing or crackles  Cardiovascular system: S1 & S2 heard,No JVD, murmurs.  Irregular irregular and tachycardic. Gastrointestinal system: Abdomen is  soft, non tender, non distended, BS +  Nervous System:Alert and oriented. No focal neurological deficits/moving extremities, sensation intact. Extremities: No edema, no clubbing, distal peripheral pulses palpable. Skin: No rashes, lesions, no icterus MSK: Normal muscle bulk,tone ,power  Medications:  Scheduled Meds: . apixaban  5 mg Oral BID  . dextromethorphan-guaiFENesin  1 tablet Oral BID  . diltiazem  120 mg Oral Daily  . sodium chloride flush  3 mL Intravenous Q12H  . sodium chloride flush  3 mL Intravenous Q12H   Continuous Infusions: . sodium chloride    . diltiazem (CARDIZEM) infusion Stopped (02/15/19 1306)    Data Reviewed: I have personally reviewed following labs and imaging studies  CBC: Recent Labs  Lab 02/14/19 1725 02/15/19 0044 02/16/19 0326  WBC 2.4* 1.7* 4.8  NEUTROABS 0.7* 1.1* 2.5  HGB 14.0 13.4 12.4  HCT 42.6 39.6  40.9 37.4  MCV 93.2 90.4 92.6  PLT 210 225 216   Basic Metabolic Panel: Recent Labs  Lab 02/14/19 1725 02/15/19 0044  NA 135 135  K 3.8 3.7  CL 103 107  CO2 24 21*  GLUCOSE 91 211*  BUN 14 12  CREATININE 0.80 0.66  CALCIUM 8.5* 8.4*  MG  --  2.0   GFR: Estimated Creatinine Clearance: 68.1 mL/min (by C-G formula based on SCr of 0.66 mg/dL). Liver Function Tests: Recent Labs  Lab 02/14/19 1725  AST 30  ALT 18  ALKPHOS 58  BILITOT 1.1  PROT 7.1  ALBUMIN 3.4*   No results for input(s): LIPASE, AMYLASE in the last 168 hours. No results for input(s): AMMONIA in the last 168 hours. Coagulation Profile: No results for input(s): INR, PROTIME in the last 168 hours. Cardiac Enzymes: No results for input(s): CKTOTAL, CKMB, CKMBINDEX, TROPONINI in the last 168 hours. BNP  (last 3 results) No results for input(s): PROBNP in the last 8760 hours. HbA1C: No results for input(s): HGBA1C in the last 72 hours. CBG: No results for input(s): GLUCAP in the last 168 hours. Lipid Profile: No results for input(s): CHOL, HDL, LDLCALC, TRIG, CHOLHDL, LDLDIRECT in the last 72 hours. Thyroid Function Tests: Recent Labs    02/15/19 0044  TSH 0.568   Anemia Panel: Recent Labs    02/15/19 0044  VITAMINB12 1,079*   Sepsis Labs: No results for input(s): PROCALCITON, LATICACIDVEN in the last 168 hours.  No results found for this or any previous visit (from the past 240 hour(s)).    Radiology Studies: Dg Chest 2 View  Result Date: 02/14/2019 CLINICAL DATA:  Flu.  Coughing up blood tinged sputum. EXAM: CHEST - 2 VIEW COMPARISON:  July 17, 2018 FINDINGS: Linear platelike opacities in the lateral left lower lung are likely atelectasis. A tortuous thoracic aorta is stable. The heart, hila, and mediastinum are unchanged. No pneumothorax. No nodules or masses. No focal infiltrates. IMPRESSION: Mild left basilar atelectasis. No acute infiltrate or other abnormality. Electronically Signed   By: Gerome Sam III M.D   On: 02/14/2019 16:21  LOS: 0 days   Time spent: More than 50% of that time was spent in counseling and/or coordination of care.  Lanae Boast, MD [facility]  02/16/2019, 1:46 PM

## 2019-02-16 NOTE — Progress Notes (Addendum)
  Echocardiogram 2D Echocardiogram has been performed with [facility].  Gerda Diss 02/16/2019, 1:01 PM

## 2019-02-17 DIAGNOSIS — M797 Fibromyalgia: Secondary | ICD-10-CM

## 2019-02-17 MED ORDER — FUROSEMIDE 10 MG/ML IJ SOLN
20.0000 mg | Freq: Once | INTRAMUSCULAR | Status: AC
  Administered 2019-02-17: 20 mg via INTRAVENOUS
  Filled 2019-02-17: qty 2

## 2019-02-17 MED ORDER — DM-GUAIFENESIN ER 30-600 MG PO TB12: 1.0000 | ORAL_TABLET | Freq: Two times a day (BID) | ORAL | 0 refills | Status: DC

## 2019-02-17 MED ORDER — APIXABAN 5 MG PO TABS: 5.0000 mg | ORAL_TABLET | Freq: Two times a day (BID) | ORAL | 0 refills | Status: DC

## 2019-02-17 MED ORDER — FUROSEMIDE 20 MG PO TABS: 20.0000 mg | ORAL_TABLET | Freq: Every day | ORAL | 0 refills | Status: DC

## 2019-02-17 MED ORDER — DILTIAZEM HCL ER COATED BEADS 120 MG PO CP24: 120.0000 mg | ORAL_CAPSULE | Freq: Every day | ORAL | 0 refills | Status: DC

## 2019-02-17 NOTE — Progress Notes (Signed)
Physical Therapy Treatment Patient Details Name: Phyllis Knight MRN: [id number] DOB: [birth year] Today's Date: 02/17/2019    History of Present Illness Patient is 78 y/o female presenting to hospital exertional dyspnea, bloody sputum, and fever and chills secondary to influenza. Patient also found to have new onset afib upon arrival to ED. PMH includes HTN, fibromyalgia, and syncope.     PT Comments    Pt admitted with above diagnosis. Pt currently with functional limitations due to balance and endurance deficits. Pt was able to ambulate with RW with min guard assist and cues and incr distance.  No LOB.  Husband to assist pt at d/c.  Pt will benefit from skilled PT to increase their independence and safety with mobility to allow discharge to the venue listed below.     Follow Up Recommendations  Home health PT;Supervision for mobility/OOB     Equipment Recommendations  None recommended by PT    Recommendations for Other Services       Precautions / Restrictions Precautions Precautions: Other (comment) Restrictions Weight Bearing Restrictions: No    Mobility  Bed Mobility Overal bed mobility: Needs Assistance Bed Mobility: Supine to Sit;Sit to Supine     Supine to sit: Supervision Sit to supine: Supervision      Transfers Overall transfer level: Needs assistance Equipment used: Rolling walker (2 wheeled) Transfers: Sit to/from [employer] Sit to Stand: Min guard Stand pivot transfers: Min guard       General transfer comment: Verbal cues for safe hand placement.   Ambulation/Gait Ambulation/Gait assistance: Min guard Gait Distance (Feet): 280 Feet Assistive device: Rolling walker (2 wheeled) Gait Pattern/deviations: Step-through pattern;Decreased stride length Gait velocity: decreased Gait velocity interpretation: <1.8 ft/sec, indicate of risk for recurrent falls General Gait Details: Patient ambulated with min guard and use of RW for safety.  Required verbal cues for sequencing with RW. HR increased to high 140 but was fluctuating during ambulation and returned to low 100s with standing rest breaks. Patient denies any symptoms. HR in 90s at end of session. Pt feels ready to go home.  Pt and husband want HH f/u.     Stairs             Wheelchair Mobility    Modified Rankin (Stroke Patients Only)       Balance Overall balance assessment: Needs assistance Sitting-balance support: Feet supported;No upper extremity supported Sitting balance-Leahy Scale: Good     Standing balance support: Bilateral upper extremity supported Standing balance-Leahy Scale: Poor Standing balance comment: reliant on BUE support to maintain standing balance                            Cognition Arousal/Alertness: Awake/alert Behavior During Therapy: WFL for tasks assessed/performed Overall Cognitive Status: Within Functional Limits for tasks assessed                                        Exercises      General Comments        Pertinent Vitals/Pain Pain Assessment: No/denies pain Faces Pain Scale: No hurt    Home Living                      Prior Function            PT Goals (current goals can now be found in  the care plan section) Acute Rehab PT Goals Patient Stated Goal: go home Progress towards PT goals: Progressing toward goals    Frequency    Min 3X/week      PT Plan Current plan remains appropriate    Co-evaluation              AM-PAC PT "6 Clicks" Mobility   Outcome Measure  Help needed turning from your back to your side while in a flat bed without using bedrails?: None Help needed moving from lying on your back to sitting on the side of a flat bed without using bedrails?: None Help needed moving to and from a bed to a chair (including a wheelchair)?: A Little Help needed standing up from a chair using your arms (e.g., wheelchair or bedside chair)?: A  Little Help needed to walk in hospital room?: A Little Help needed climbing 3-5 steps with a railing? : A Lot 6 Click Score: 19    End of Session Equipment Utilized During Treatment: Gait belt Activity Tolerance: Patient tolerated treatment well Patient left: in bed;with call bell/phone within reach;with family/visitor present Nurse Communication: Mobility status PT Visit Diagnosis: Other abnormalities of gait and mobility (R26.89);Muscle weakness (generalized) (M62.81);Difficulty in walking, not elsewhere classified (R26.2)     Time: 2122-4825 PT Time Calculation (min) (ACUTE ONLY): 19 min  Charges:  $Gait Training: 8-22 mins                     Keirstyn Aydt,PT Acute Rehabilitation Services Pager:  [phone]  Office:  [phone]     Berline Lopes 02/17/2019, 1:24 PM

## 2019-02-17 NOTE — TOC Initial Note (Signed)
Transition of Care [facility]) - Initial/Assessment Note    Patient Details  Name: EMSLEY Knight MRN: [id number] Date of Birth: Mar 06, 1941  [facility] CM/SW Contact:    Phyllis Lewandowsky, RN Phone Number: 02/17/2019, 12:40 PM  Clinical Narrative: Pt presented for Atrial Fib- initiated on Eliquis. Benefits check completed and 30 day free card provided to patient. PT recommendations for HH PT- CM did make referral for [facility] PT with [facility]. Referral made to [facility] with [facility]. SOC to begin within 24-48 hours post transition home. Pt has PCP and is able to get medications without any problems. Husband to provide transportation home. No further needs from CM at this time.                  Expected Discharge Plan: Home w Home Health Services Barriers to Discharge: No Barriers Identified   Patient Goals and CMS Choice Patient states their goals for this hospitalization and ongoing recovery are:: "to feel better" CMS Medicare.gov Compare Post Acute Care list provided to:: (Discussed via phone-droplet precautions. ) Choice offered to / list presented to : Patient, Spouse  Expected Discharge Plan and Services Expected Discharge Plan: Home w Home Health Services Discharge Planning Services: CM Consult Post Acute Care Choice: (N/A) Living arrangements for the past 2 months: Single Family Home Expected Discharge Date: 02/17/19               DME Arranged: N/A DME Agency: NA HH Arranged: PT HH Agency: [facility]  Prior Living Arrangements/Services Living arrangements for the past 2 months: Single Family Home Lives with:: Spouse Patient language and need for interpreter reviewed:: No Do you feel safe going back to the place where you live?: Yes      Need for Family Participation in Patient Care: No (Comment) Care giver support system in place?: No (comment) Current home services: (N/A) Criminal Activity/Legal Involvement Pertinent to Current  Situation/Hospitalization: No - Comment as needed  Activities of Daily Living Home Assistive Devices/Equipment: Environmental consultant (specify type), Cane (specify quad or straight) ADL Screening (condition at time of admission) Patient's cognitive ability adequate to safely complete daily activities?: Yes Is the patient deaf or have difficulty hearing?: No Does the patient have difficulty seeing, even when wearing glasses/contacts?: No Does the patient have difficulty concentrating, remembering, or making decisions?: No Patient able to express need for assistance with ADLs?: Yes Does the patient have difficulty dressing or bathing?: No Independently performs ADLs?: Yes (appropriate for developmental age) Does the patient have difficulty walking or climbing stairs?: Yes Weakness of Legs: Both Weakness of Arms/Hands: None  Permission Sought/Granted Permission sought to share information with : Case Manager                Emotional Assessment Appearance:: Appears stated age Attitude/Demeanor/Rapport: Engaged, Gracious Affect (typically observed): Accepting Orientation: : Oriented to Self, Oriented to Place, Oriented to  Time, Oriented to Situation Alcohol / Substance Use: Not Applicable Psych Involvement: No (comment)  Admission diagnosis:  Bronchitis [J40] Atrial fibrillation with RVR (HCC) [I48.91] Neutropenia associated with infection (HCC) [D70.3] Influenza [J11.1] Patient Active Problem List   Diagnosis Date Noted  . Influenza 02/14/2019  . Atrial fibrillation with RVR (HCC) 02/14/2019  . Neutropenia (HCC) 02/14/2019  . Hypertension 07/17/2018  . Fibromyalgia 07/17/2018  . Left knee pain 07/17/2018  . 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 Expected blood loss anemia 08/04/2013  . Obese 08/04/2013  . 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 [place] - [address] [place]  [address] Phone: [phone] Fax: [phone]     Social Determinants of Health (SDOH) Interventions    Readmission Risk Interventions 30 Day Unplanned Readmission Risk Score     ED to Hosp-Admission (Current) from 02/14/2019 in [facility]  30 Day Unplanned Readmission Risk Score (%)  12 Filed at 02/17/2019 1200     This score is the patient's risk of an unplanned readmission within 30 days of being discharged (0 -100%). The score is based on dignosis, age, lab data, medications, orders, and past utilization.   Low:  0-14.9   Medium: 15-21.9   High: 22-29.9   Extreme: 30 and above       No flowsheet data found.

## 2019-02-17 NOTE — Progress Notes (Addendum)
Subjective:  Chest pain improved Still has wheezing Reported blood tinged sputum  Objective:  Vital Signs in the last 24 hours: Temp:  [98 F (36.7 C)-98.3 F (36.8 C)] 98.3 F (36.8 C) (03/18 0447) Pulse Rate:  [61-100] 92 (03/18 0447) Resp:  [18-20] 20 (03/18 0447) BP: (110-127)/(71-95) 121/81 (03/18 0940) SpO2:  [95 %-98 %] 95 % (03/18 0447) Weight:  [91 kg] 91 kg (03/18 0447)  Intake/Output from previous day: 03/17 0701 - 03/18 0700 In: 920 [P.O.:920] Out: 502 [Urine:502]  Physical Exam  Pulmonary/Chest: She has rales (Left lower lobe).   Constitutional: She is oriented to person, place, and time. She appears well-developed and well-nourished. No distress.  HENT:  Head: Normocephalic and atraumatic.  Eyes: Pupils are equal, round, and reactive to light. Conjunctivae are normal.  Neck: No JVD present.  Cardiovascular: Normal rate and intact distal pulses. An irregularly irregular rhythm present.  No murmur heard. Pulmonary/Chest: Effort normal. She has wheezes. She has no rales.  Abdominal: Soft. Bowel sounds are normal. There is no rebound.  Musculoskeletal:        General: No edema.  Lymphadenopathy:    She has no cervical adenopathy.  Neurological: She is alert and oriented to person, place, and time. No cranial nerve deficit.  Skin: Skin is warm and dry.  Psychiatric: She has a normal mood and affect.  Nursing note and vitals reviewed.  Lab Results: BMP Recent Labs    07/17/18 0017 02/14/19 1725 02/15/19 0044  NA 141 135 135  K 3.8 3.8 3.7  CL 108 103 107  CO2  --  24 21*  GLUCOSE 87 91 211*  BUN 11 14 12   CREATININE 0.50 0.80 0.66  CALCIUM  --  8.5* 8.4*  GFRNONAA  --  >60 >60  GFRAA  --  >60 >60    CBC Recent Labs  Lab 02/16/19 0326  WBC 4.8  RBC 4.04  HGB 12.4  HCT 37.4  PLT 216  MCV 92.6  MCH 30.7  MCHC 33.2  RDW 11.9  LYMPHSABS 1.8  MONOABS 0.5  EOSABS 0.0  BASOSABS 0.0    HEMOGLOBIN A1C No results found for: HGBA1C,  MPG  Cardiac Panel (last 3 results) No results for input(s): CKTOTAL, CKMB, TROPONINI, RELINDX in the last 8760 hours.  BNP (last 3 results) Recent Labs    02/14/19 1726  BNP 84.2    TSH Recent Labs    02/15/19 0044  TSH 0.568    Lipid Panel  No results found for: CHOL, TRIG, HDL, CHOLHDL, VLDL, LDLCALC, LDLDIRECT   Hepatic Function Panel Recent Labs    02/14/19 1725  PROT 7.1  ALBUMIN 3.4*  AST 30  ALT 18  ALKPHOS 58  BILITOT 1.1    Cardiac Studies:  EKG 02/14/2019: A. fib with RVR.  No ischemic changes.  Echocardiogram 02/16/2019:  1. The left ventricle has normal systolic function with an ejection fraction of 60-65%. The cavity size was normal. There is mild concentric left ventricular hypertrophy. Diastolic function could not be assessed due to atrial fibrillation.  2. Low normal RV systolic function.  3. Mild left atrial dilatation. Moderate right atrial dilatation.  4. Mild to moderate mitral and tricuspid regurgitation. RVSP 29 mmHg.  5. Vavlular regurgitation and chamber dilatation new since previous echocardiogram on 07/17/2018.   Assessment & Recommendations:  78 y.o. African American female  with hypertension, fibromyalgia, recent Influenza-treated with Tamiflu by PCP starting 02/09/2019,, now with Afib RVR  Afib RVR: Onset unclear. Echocardiogram  with atrial dilatation suggests this may not be acute. Exacerbated by recent Influenza infection. Avoiding beta blocker given severe bronchospasm Rate controlled on diltiazem 120 mg daily. Added 30 mg q6 hr prn CHA2DS2VASc score 4. Annual stroke risk 5%. Continue Eliquis 5 mg bid.  Minor hemoptysis likely as a result of her influenza bronchitis. Monitor for severe bleeding.  Recommend lasix 20 mg IV once. Recommend discharge on 20 mg PO daily.  Given that she is still recovering from post influenza bronchitis, I do not recommend cardioversion at this time. Continue rate control for now.  Cardioversion can be considered outpatient after 3-4 weeks on anticoagulation.    Elder Negus, M.D. 02/17/2019, 12:01 PM [facility] Pager: [phone] Office: [phone] If no answer: [phone]

## 2019-02-17 NOTE — Discharge Summary (Signed)
Physician Discharge Summary  Phyllis Knight:[id number] DOB: Jun 22, 1941 DOA: 02/14/2019  PCP: Merri Brunette, MD  Admit date: 02/14/2019 Discharge date: 02/17/2019  Time spent: 45 minutes  Recommendations for Outpatient Follow-up:  Patient will be discharged to home.  Patient will need to follow up with primary care provider within one week of discharge, repeat CBC and BMP.  Follow-up with cardiology in 3 to 4 weeks.  Patient should continue medications as prescribed.  Patient should follow a heart healthy diet.    Discharge Diagnoses:  Principal Problem:   Atrial fibrillation with RVR (HCC)- new onset Active Problems:   Hypertension   Fibromyalgia   Influenza   Neutropenia (HCC)   Discharge Condition: Stable  Diet recommendation: heart healthy  Filed Weights   02/14/19 1505 02/15/19 0458 02/17/19 0447  Weight: 97.1 kg 97.1 kg 91 kg    History of present illness:  On 02/14/2019 by Dr. Marcial Pacas Opyd Phyllis Knight is a 78 y.o. female with medical history significant for hypertension, fibromyalgia, and recent influenza, now presenting to the emergency department for evaluation of lightheadedness, exertional dyspnea, and an episode of bloody sputum.  Patient reports that she developed a flulike illness approximately 1 week ago, saw her PCP on 02/09/2019, tested positive for influenza, and was started on Tamiflu.  The flu symptoms had been improving but she developed fairly acute lightheadedness with chest discomfort/palpitations, and re-worsening in her dyspnea.  Her cough has persisted, but the aches and fever/chills have improved.  She had an episode of blood-tinged sputum 2 days ago and stopped taking Tamiflu at that time.  She denies chest pain per se, but has some fluttering chest discomfort.  She denies lower extremity swelling or tenderness, recent immobilization or surgery, or personal or family history of VTE.  Hospital Course:  Atrial fibrillation with RVR, new onset  -Unclear etiology, Suspect exacerbated by influenza infection -Patient presented with lightheadedness, symptomatic on presentation -TSH within normal limits -Echocardiogram EF of 60 to 65%.  Left atrial size mildly dilated.  Right atrial size was moderately dilated. -Was placed on Cardizem drip which was discontinued on 02/15/2019, and transition to oral Cardizem -Cardiology consulted and appreciated, recommending anticoagulation for 3 to 4 weeks with outpatient follow-up for possible cardioversion -CHADSVasc 4  Recent influenza infection with cough -Patient was diagnosed on 02/09/2019 by her primary care physician -Continue supportive care -Cardiology gave patient IV Lasix and will follow with additional dose today.  Recommending Lasix 20 mg daily on discharge  Hemoptysis -Suspect secondary to recent influenza infection as well as bronchospasm and cough.  May be slightly worsened by Eliquis -Hemoglobin has remained stable  Neutropenia/leukopenia -Possibly secondary to influenza on Tamiflu, improving -Repeat CBC in 1 week  Essential hypertension -Continue Cardizem  Fibromyalgia -Stable  Procedures: Cardiogram  Consultations: Cardiology  Discharge Exam: Vitals:   02/17/19 0447 02/17/19 0940  BP: 110/83 121/81  Pulse: 92   Resp: 20   Temp: 98.3 F (36.8 C)   SpO2: 95%      General: Well developed, well nourished, NAD  HEENT: NCAT, mucous membranes moist.  Cardiovascular: S1 S2 auscultated, irregular  Respiratory: Diminished breath sounds, mild rales left lower lobe  Abdomen: Soft, nontender, nondistended, + bowel sounds  Extremities: warm dry without cyanosis clubbing or edema  Neuro: AAOx3, nonfocal  Psych: Pleasant, appropriate mood and affect  Discharge Instructions Discharge Instructions    Discharge instructions   Complete by:  As directed    Patient will be discharged to home.  Patient will need to follow up with primary care provider within one  week of discharge, repeat CBC and BMP.  Follow-up with cardiology in 3 to 4 weeks.  Patient should continue medications as prescribed.  Patient should follow a heart healthy diet.     Allergies as of 02/17/2019      Reactions   Codeine Nausea And Vomiting   Zofran [ondansetron Hcl] Rash      Medication List    STOP taking these medications   amLODipine 10 MG tablet Commonly known as:  NORVASC   aspirin EC 81 MG tablet   benazepril 20 MG tablet Commonly known as:  LOTENSIN   oseltamivir 75 MG capsule Commonly known as:  TAMIFLU     TAKE these medications   acetaminophen 500 MG tablet Commonly known as:  TYLENOL Take 500 mg by mouth every 6 (six) hours as needed for mild pain, moderate pain, fever or headache.   albuterol 108 (90 Base) MCG/ACT inhaler Commonly known as:  PROVENTIL HFA;VENTOLIN HFA Inhale 2 puffs into the lungs every 4 (four) hours as needed for wheezing or shortness of breath.   apixaban 5 MG Tabs tablet Commonly known as:  ELIQUIS Take 1 tablet (5 mg total) by mouth 2 (two) times daily.   cholecalciferol 1000 units tablet Commonly known as:  VITAMIN D Take 2,000 Units by mouth daily.   dextromethorphan-guaiFENesin 30-600 MG 12hr tablet Commonly known as:  MUCINEX DM Take 1 tablet by mouth 2 (two) times daily.   diltiazem 120 MG 24 hr capsule Commonly known as:  CARDIZEM CD Take 1 capsule (120 mg total) by mouth daily. Start taking on:  February 18, 2019   furosemide 20 MG tablet Commonly known as:  Lasix Take 1 tablet (20 mg total) by mouth daily.   meclizine 12.5 MG tablet Commonly known as:  ANTIVERT Take 12.5 mg by mouth daily as needed for dizziness.   OVER THE COUNTER MEDICATION Apply 1 application topically daily as needed (itching). OTC cream for itching   potassium chloride 8 MEQ tablet Commonly known as:  KLOR-CON Take 8 mEq by mouth daily.      Allergies  Allergen Reactions  . Codeine Nausea And Vomiting  . Zofran  [Ondansetron Hcl] Rash   Follow-up Information    Merri Brunette, MD. Schedule an appointment as soon as possible for a visit in 1 week(s).   Specialty:  Family Medicine Why:  Hospital follow-up Contact information: [address], [address] [place] [address] [phone]        Elder Negus, MD. Schedule an appointment as soon as possible for a visit in 3 week(s).   Specialties:  Cardiology, Radiology Why:  Hospital follow up Contact information: [address] [place] [address] [phone]            The results of significant diagnostics from this hospitalization (including imaging, microbiology, ancillary and laboratory) are listed below for reference.    Significant Diagnostic Studies: Dg Chest 2 View  Result Date: 02/14/2019 CLINICAL DATA:  Flu.  Coughing up blood tinged sputum. EXAM: CHEST - 2 VIEW COMPARISON:  July 17, 2018 FINDINGS: Linear platelike opacities in the lateral left lower lung are likely atelectasis. A tortuous thoracic aorta is stable. The heart, hila, and mediastinum are unchanged. No pneumothorax. No nodules or masses. No focal infiltrates. IMPRESSION: Mild left basilar atelectasis. No acute infiltrate or other abnormality. Electronically Signed   By: Gerome Sam III M.D  On: 02/14/2019 16:21    Microbiology: No results found for this or any previous visit (from the past 240 hour(s)).   Labs: Basic Metabolic Panel: Recent Labs  Lab 02/14/19 1725 02/15/19 0044  NA 135 135  K 3.8 3.7  CL 103 107  CO2 24 21*  GLUCOSE 91 211*  BUN 14 12  CREATININE 0.80 0.66  CALCIUM 8.5* 8.4*  MG  --  2.0   Liver Function Tests: Recent Labs  Lab 02/14/19 1725  AST 30  ALT 18  ALKPHOS 58  BILITOT 1.1  PROT 7.1  ALBUMIN 3.4*   No results for input(s): LIPASE, AMYLASE in the last 168 hours. No results for input(s): AMMONIA in the last 168 hours. CBC: Recent Labs  Lab 02/14/19 1725 02/15/19 0044  02/16/19 0326  WBC 2.4* 1.7* 4.8  NEUTROABS 0.7* 1.1* 2.5  HGB 14.0 13.4 12.4  HCT 42.6 39.6  40.9 37.4  MCV 93.2 90.4 92.6  PLT 210 225 216   Cardiac Enzymes: No results for input(s): CKTOTAL, CKMB, CKMBINDEX, TROPONINI in the last 168 hours. BNP: BNP (last 3 results) Recent Labs    02/14/19 1726  BNP 84.2    ProBNP (last 3 results) No results for input(s): PROBNP in the last 8760 hours.  CBG: No results for input(s): GLUCAP in the last 168 hours.     Signed:  Edsel Petrin  [facility] 02/17/2019, 11:12 AM

## 2019-03-10 ENCOUNTER — Ambulatory Visit: Payer: Self-pay | Admitting: Cardiology

## 2019-03-23 ENCOUNTER — Telehealth: Payer: Self-pay

## 2019-03-24 ENCOUNTER — Telehealth (INDEPENDENT_AMBULATORY_CARE_PROVIDER_SITE_OTHER): Payer: Medicare Other

## 2019-03-24 ENCOUNTER — Encounter: Payer: Self-pay | Admitting: Cardiology

## 2019-03-24 ENCOUNTER — Ambulatory Visit (INDEPENDENT_AMBULATORY_CARE_PROVIDER_SITE_OTHER): Payer: Medicare Other | Admitting: Cardiology

## 2019-03-24 ENCOUNTER — Other Ambulatory Visit: Payer: Self-pay

## 2019-03-24 VITALS — Ht 66.0 in | Wt 212.0 lb

## 2019-03-24 DIAGNOSIS — I4891 Unspecified atrial fibrillation: Secondary | ICD-10-CM

## 2019-03-24 DIAGNOSIS — R0789 Other chest pain: Secondary | ICD-10-CM

## 2019-03-24 DIAGNOSIS — R079 Chest pain, unspecified: Secondary | ICD-10-CM

## 2019-03-24 MED ORDER — DILTIAZEM HCL 30 MG PO TABS: 30.0000 mg | ORAL_TABLET | Freq: Three times a day (TID) | ORAL | 2 refills | Status: DC

## 2019-03-24 NOTE — Progress Notes (Signed)
   Telephone visit note  Subjective:   Phyllis Knight, female    DOB: August 15, 1941, 78 y.o.   MRN: [id number]   I connected with the patient on 03/25/19 by a telephone call and verified that I am speaking with the correct person using two identifiers.     I offered the patient a video enabled application for a virtual visit. Unfortunately, this could not be accomplished due to technical difficulties/lack of video enabled phone/computer. I discussed the limitations of evaluation and management by telemedicine and the availability of in person appointments. The patient expressed understanding and agreed to proceed.   This visit type was conducted due to national recommendations for restrictions regarding the COVID-19 Pandemic (e.g. social distancing).  This format is felt to be most appropriate for this patient at this time.  All issues noted in this document were discussed and addressed.  No physical exam was performed (except for noted visual exam findings with Tele health visits).  The patient has consented to conduct a Tele health visit and understands insurance will be billed.   78 y.o.African Americanfemalewith hypertension, fibromyalgia, influenza illness in 02-01/2019, hospital admission for Afib RVR in 01/2019.  I had recommended rate control management with anticoagulation at that time. I would consider cardioversion in future, if necessary. While she was asked to start eliquis 5 mg bid, diltiazem 120 mg daily, and stop amlodipine and benazepril, it appears that she is not taking diltiazem, but taking eliquis, amlodipine, and benazepril. That said, she is doing well without any palpitation symptoms. She has not had any bleeding diathesis. Cough has improved as well. She saw her PCP a week ago and was reporteldy doing well.   Assessment & Recommendations:  78 y.o.African Americanfemalewith hypertension, fibromyalgia, influenza illness in 02-01/2019, hospital admission for Afib RVR in  01/2019.  Afib: Unclear if paroxysmal or persistent. CHA2DS2VASc score 4. Annual stroke risk 5%. Continue eliuis 5 mg bid.  Prescribed diltiazem 30 mg for q8 hr prn use, in case of palpitations.  I will see her back in 3 months, repeat EKG and consider stress testing to evaluate for ischemia as the etiology of her Afib.   Total time spent: 15 minutes.  Elder Negus, MD [facility] Pager: [phone] Office: [phone] If no answer Cell [phone]

## 2019-03-25 ENCOUNTER — Encounter: Payer: Self-pay | Admitting: Cardiology

## 2019-03-26 NOTE — Telephone Encounter (Signed)
Patient c/o chest pain since yesterday after she started taking diltiazem 30 mg tablet t.i.d., described as left-sided chest discomfort on the left upper part.  It is continuous, tender to touch.  No other associated symptoms of dyspnea or palpitations.  I have advised the patient that the chest discomfort is probably noncardiac, she has found some relief with using Tylenol, advised her to take extra strength Tylenol 3 times a day 2 tablets as needed only and if symptoms not improved to let us know. 10 min telephone encounter.

## 2019-04-19 NOTE — Telephone Encounter (Signed)
Pt stated that she has had chest pain since starting taking the diltiazem

## 2019-04-28 ENCOUNTER — Encounter: Payer: Self-pay | Admitting: Cardiology

## 2019-04-28 ENCOUNTER — Other Ambulatory Visit: Payer: Self-pay

## 2019-04-28 ENCOUNTER — Ambulatory Visit (INDEPENDENT_AMBULATORY_CARE_PROVIDER_SITE_OTHER): Payer: Medicare Other | Admitting: Cardiology

## 2019-04-28 ENCOUNTER — Ambulatory Visit: Payer: Medicare Other

## 2019-04-28 VITALS — BP 161/93 | HR 71 | Temp 97.2°F | Ht 66.0 in | Wt 211.0 lb

## 2019-04-28 DIAGNOSIS — I480 Paroxysmal atrial fibrillation: Secondary | ICD-10-CM

## 2019-04-28 DIAGNOSIS — R002 Palpitations: Secondary | ICD-10-CM

## 2019-04-28 DIAGNOSIS — R0789 Other chest pain: Secondary | ICD-10-CM | POA: Insufficient documentation

## 2019-04-28 MED ORDER — APIXABAN 5 MG PO TABS: 5.0000 mg | ORAL_TABLET | Freq: Two times a day (BID) | ORAL | 2 refills | Status: DC

## 2019-04-28 MED ORDER — DILTIAZEM HCL ER COATED BEADS 120 MG PO CP24: 120.0000 mg | ORAL_CAPSULE | Freq: Every day | ORAL | 2 refills | Status: DC

## 2019-04-28 NOTE — Progress Notes (Signed)
Follow up visit  Subjective:   Phyllis Knight, female    DOB: 03-Apr-1941, 78 y.o.   MRN: [id number]   Chief Complaint  Patient presents with  . Palpitations    HPI  78 y.o.African Americanfemalewith hypertension, fibromyalgia, influenza illness in 02-01/2019, hospital admission for Afib RVR in 01/2019.  At telephone visit on 04/22, I continued eliuis 5 mg bid, prescribed diltiazem 30 mg for q8 hr prn use, in case of palpitations, and continued eliuis 5 mg bid.   Patient requested an acute visit today.  Last night, she felt that her heart was beating slow.  She had difficulty sleeping.  She denies any chest pain.  She denies any unusual shortness of breath.  Blood pressure is elevated today.  Patient reports that benazepril was stopped by her PCP last week.   Past Medical History:  Diagnosis Date  . Anemia    pt denies  . Arthritis   . Atrial fibrillation (HCC)   . Bradycardia   . Bronchitis    hx of   . Chest pain 06-10-14   had chest pain thia am  . Dizziness   . Fibromyalgia   . 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 GERD (gastroesophageal reflux disease)   . H/O hiatal hernia   . Hearing loss    left ear   . History of uterine cancer 1975   s/p hysterectomy  . Hypertension   . Numbness and tingling    hands and feet bilat   . Pneumonia yrs ago  . Shortness of breath dyspnea   . Syncope 07/16/2018  . Tinnitus   . Urge urinary incontinence   . Urinary incontinence   . Vitamin D deficiency      Past Surgical History:  Procedure Laterality Date  . CERVICAL FUSION  2011  . CHOLECYSTECTOMY N/A 06/14/2014   Procedure: LAPAROSCOPIC CHOLECYSTECTOMY WITH attempted INTRAOPERATIVE CHOLANGIOGRAM;  Surgeon: [name], MD;  Location: [facility];  Service: General;  Laterality: N/A;  . INSERTION OF MESH N/A 07/18/2015   Procedure: INSERTION OF MESH;  Surgeon: Ovidio Kin, MD;  Location: [facility];  Service: General;  Laterality: N/A;  . KNEE  CLOSED REDUCTION Left 09/21/2013   Procedure: CLOSED MANIPULATION LEFT KNEE;  Surgeon: Shelda Pal, MD;  Location: [facility];  Service: Orthopedics;  Laterality: Left;  . TOTAL ABDOMINAL HYSTERECTOMY W/ BILATERAL SALPINGOOPHORECTOMY  1972  . TOTAL KNEE ARTHROPLASTY Left 08/03/2013   Procedure: LEFT TOTAL KNEE ARTHROPLASTY;  Surgeon: Shelda Pal, MD;  Location: [facility];  Service: Orthopedics;  Laterality: Left;  . tracheotomy    . TYMPANOPLASTY Left   . VENTRAL HERNIA REPAIR N/A 07/18/2015   Procedure: LAPAROSCOPIC VENTRAL AND UMBILICAL  HERNIA LYSIS OF ADHESIONS;  Surgeon: Ovidio Kin, MD;  Location: [facility];  Service: General;  Laterality: N/A;     Social History   Socioeconomic History  . Marital status: Married    Spouse name: Not on file  . Number of children: 4  . Years of education: Not on file  . Highest education level: Not on file  Occupational History  . Not on file  Social Needs  . Financial resource strain: Not on file  . Food insecurity:    Worry: Not on file    Inability: Not on file  . Transportation needs:    Medical: Not on file    Non-medical: Not on file  Tobacco Use  .  Smoking status: Never Smoker  . Smokeless tobacco: Never Used  Substance and Sexual Activity  . Alcohol use: No  . Drug use: No  . Sexual activity: Not on file  Lifestyle  . Physical activity:    Days per week: Not on file    Minutes per session: Not on file  . Stress: Not on file  Relationships  . Social connections:    Talks on phone: Not on file    Gets together: Not on file    Attends religious service: Not on file    Active member of club or organization: Not on file    Attends meetings of clubs or organizations: Not on file    Relationship status: Not on file  . Intimate partner violence:    Fear of current or ex partner: Not on file    Emotionally abused: Not on file    Physically abused: Not on file    Forced sexual activity: Not on file  Other Topics  Concern  . Not on file  Social History Narrative  . Not on file     Family History  Problem Relation Age of Onset  . Stroke Mother   . Stroke Father      Current Outpatient Medications on File Prior to Visit  Medication Sig Dispense Refill  . acetaminophen (TYLENOL) 500 MG tablet Take 500 mg by mouth every 6 (six) hours as needed for mild pain, moderate pain, fever or headache.     . albuterol (PROVENTIL HFA;VENTOLIN HFA) 108 (90 Base) MCG/ACT inhaler Inhale 2 puffs into the lungs every 4 (four) hours as needed for wheezing or shortness of breath.     Marland Kitchen amLODipine (NORVASC) 10 MG tablet Take 10 mg by mouth daily.    Marland Kitchen apixaban (ELIQUIS) 5 MG TABS tablet Take 1 tablet (5 mg total) by mouth 2 (two) times daily. 60 tablet 0  . benazepril (LOTENSIN) 20 MG tablet Take 20 mg by mouth daily.    . cholecalciferol (VITAMIN D) 1000 units tablet Take 2,000 Units by mouth daily.     Marland Kitchen diltiazem (CARDIZEM) 30 MG tablet Take 1 tablet (30 mg total) by mouth 3 (three) times daily. 60 tablet 2  . furosemide (LASIX) 20 MG tablet Take 1 tablet (20 mg total) by mouth daily. 30 tablet 0  . OVER THE COUNTER MEDICATION Apply 1 application topically daily as needed (itching). OTC cream for itching    . potassium chloride (KLOR-CON) 8 MEQ tablet Take 8 mEq by mouth daily.      No current facility-administered medications on file prior to visit.     Cardiovascular studies:  EKG 04/28/2019: Sinus rhythm 74 bpm.  Early R wave transition. Otherwise normal EKG.  Echocardiogram 02/16/2019:  1. The left ventricle has normal systolic function with an ejection fraction of 60-65%. The cavity size was normal. There is mild concentric left ventricular hypertrophy. Diastolic function could not be assessed due to atrial fibrillation.  2. Low normal RV systolic function.  3. Mild left atrial dilatation. Moderate right atrial dilatation.  4. Mild to moderate mitral and tricuspid regurgitation. RVSP 29 mmHg.  5.  Vavlular regurgitation and chamber dilatation new since previous echocardiogram on 07/17/2018.  Recent labs: Results for Phyllis Knight, Phyllis Knight (MRN [id number]) as of 04/28/2019 12:21  Ref. Range 02/14/2019 17:25 02/14/2019 17:26 02/14/2019 20:21 02/15/2019 00:44  BASIC METABOLIC PANEL Unknown    Rpt (A)  COMPREHENSIVE METABOLIC PANEL Unknown Rpt (A)     Sodium Latest Ref Range:  135 - 145 mmol/L 135   135  Potassium Latest Ref Range: 3.5 - 5.1 mmol/L 3.8   3.7  Chloride Latest Ref Range: 98 - 111 mmol/L 103   107  CO2 Latest Ref Range: 22 - 32 mmol/L 24   21 (L)  Glucose Latest Ref Range: 70 - 99 mg/dL 91   161211 (H)  BUN Latest Ref Range: 8 - 23 mg/dL 14   12  Creatinine Latest Ref Range: 0.44 - 1.00 mg/dL 0.960.80   0.450.66  Calcium Latest Ref Range: 8.9 - 10.3 mg/dL 8.5 (L)   8.4 (L)  Anion gap Latest Ref Range: 5 - 15  8   7   Magnesium Latest Ref Range: 1.7 - 2.4 mg/dL    2.0  Alkaline Phosphatase Latest Ref Range: 38 - 126 U/L 58     Albumin Latest Ref Range: 3.5 - 5.0 g/dL 3.4 (L)     AST Latest Ref Range: 15 - 41 U/L 30     ALT Latest Ref Range: 0 - 44 U/L 18     Total Protein Latest Ref Range: 6.5 - 8.1 g/dL 7.1     Total Bilirubin Latest Ref Range: 0.3 - 1.2 mg/dL 1.1     GFR, Est Non African American Latest Ref Range: >60 mL/min >60   >60  GFR, Est African American Latest Ref Range: >60 mL/min >60   >60  B Natriuretic Peptide Latest Ref Range: 0.0 - 100.0 pg/mL  84.2    Troponin i, poc Latest Ref Range: 0.00 - 0.08 ng/mL   0.01    Results for Laflamme, Earnestine MealingMATTIE L (MRN [id number]) as of 04/28/2019 12:21  Ref. Range 02/16/2019 03:26  WBC Latest Ref Range: 4.0 - 10.5 K/uL 4.8  RBC Latest Ref Range: 3.87 - 5.11 MIL/uL 4.04  Hemoglobin Latest Ref Range: 12.0 - 15.0 g/dL 78.212.4  HCT Latest Ref Range: 36.0 - 46.0 % 37.4  MCV Latest Ref Range: 80.0 - 100.0 fL 92.6  MCH Latest Ref Range: 26.0 - 34.0 pg 30.7  MCHC Latest Ref Range: 30.0 - 36.0 g/dL 95.633.2  RDW Latest Ref Range: 11.5 - 15.5 % 11.9  Platelets  Latest Ref Range: 150 - 400 K/uL 216  nRBC Latest Ref Range: 0.0 - 0.2 % 0.0   Results for Buehler, Earnestine MealingMATTIE L (MRN [id number]) as of 04/28/2019 12:21  Ref. Range 02/15/2019 00:44  Glucose Latest Ref Range: 70 - 99 mg/dL 469211 (H)  TSH Latest Ref Range: 0.350 - 4.500 uIU/mL 0.568    Review of Systems  Constitution: Positive for malaise/fatigue. Negative for decreased appetite, weight gain and weight loss.  HENT: Negative for congestion.   Eyes: Negative for visual disturbance.  Cardiovascular: Positive for palpitations. Negative for chest pain, dyspnea on exertion, leg swelling and syncope.  Respiratory: Negative for cough.   Endocrine: Negative for cold intolerance.  Hematologic/Lymphatic: Does not bruise/bleed easily.  Skin: Negative for itching and rash.  Musculoskeletal: Negative for myalgias.  Gastrointestinal: Negative for abdominal pain, nausea and vomiting.  Genitourinary: Negative for dysuria.  Neurological: Negative for dizziness and weakness.  Psychiatric/Behavioral: The patient is not nervous/anxious.   All other systems reviewed and are negative.        Vitals:   04/28/19 1204  BP: (!) 161/93  Pulse: 71  Temp: (!) 97.2 F (36.2 C)  SpO2: 100%    There is no height or weight on file to calculate BMI. Filed Weights   04/28/19 1204  Weight: 95.7 kg  Objective:   Physical Exam  Constitutional: She is oriented to person, place, and time. She appears well-developed and well-nourished. No distress.  HENT:  Head: Normocephalic and atraumatic.  Eyes: Pupils are equal, round, and reactive to light. Conjunctivae are normal.  Neck: No JVD present.  Cardiovascular: Normal rate, regular rhythm and intact distal pulses.  Pulmonary/Chest: Effort normal and breath sounds normal. She has no wheezes. She has no rales.  Abdominal: Soft. Bowel sounds are normal. There is no rebound.  Musculoskeletal:        General: No edema.  Lymphadenopathy:    She has no cervical  adenopathy.  Neurological: She is alert and oriented to person, place, and time. No cranial nerve deficit.  Skin: Skin is warm and dry.  Psychiatric: She has a normal mood and affect.  Nursing note and vitals reviewed.         Assessment & Recommendations:   Assessment & Recommendations:  78 y.o.African Americanfemalewith hypertension, fibromyalgia, hospital admission for Afib RVR in 01/2019 in the setting of influenza illness, now with palpitations  Palpitations: She is in fact in sinus rhythm today. I have given her event monitor to further evaluate. I do not suspect angina symptoms at this time.  Paroxysmal Afib: Paroxysmal  CHA2DS2VASc score 4. Annual stroke risk 5%. Continue eliuis 5 mg bid.  Switch to diltiazem 120 mg once daily.  Hypertension: Suboptimal control today. Benazepril was recently stopped by PCP. Recommend BP f/u with Dr. Katrinka Blazing.  I will have a telephone f/u w/her after event monitor.    Elder Negus, MD [facility] Pager: [phone] Office: [phone] If no answer Cell [phone]

## 2019-04-30 ENCOUNTER — Ambulatory Visit: Payer: Medicare Other | Admitting: Cardiology

## 2019-05-19 ENCOUNTER — Ambulatory Visit: Payer: Medicare Other | Admitting: Cardiology

## 2019-05-20 ENCOUNTER — Other Ambulatory Visit: Payer: Self-pay

## 2019-05-20 ENCOUNTER — Encounter: Payer: Self-pay | Admitting: Cardiology

## 2019-05-20 ENCOUNTER — Ambulatory Visit (INDEPENDENT_AMBULATORY_CARE_PROVIDER_SITE_OTHER): Payer: Medicare Other | Admitting: Cardiology

## 2019-05-20 VITALS — BP 165/83 | HR 74 | Temp 97.3°F | Ht 66.0 in | Wt 214.9 lb

## 2019-05-20 DIAGNOSIS — R0789 Other chest pain: Secondary | ICD-10-CM | POA: Diagnosis not present

## 2019-05-20 DIAGNOSIS — I480 Paroxysmal atrial fibrillation: Secondary | ICD-10-CM

## 2019-05-20 MED ORDER — NITROGLYCERIN 0.4 MG SL SUBL: 0.4000 mg | SUBLINGUAL_TABLET | SUBLINGUAL | 3 refills | Status: DC | PRN

## 2019-05-20 MED ORDER — DILTIAZEM HCL ER COATED BEADS 240 MG PO CP24: 240.0000 mg | ORAL_CAPSULE | Freq: Every day | ORAL | 3 refills | Status: DC

## 2019-05-20 NOTE — Progress Notes (Signed)
Follow up visit  Subjective:   Phyllis Knight, female    DOB: 1941-11-04, 78 y.o.   MRN: [id number]   Chief Complaint  Patient presents with  . Atrial Fibrillation  . Hypertension  . Follow-up    4wk    HPI  78 y.o.African Americanfemalewith hypertension, fibromyalgia, influenza illness in 02-01/2019, hospital admission for Afib RVR in 01/2019.  At last visit, I had recommended event monitor given her recurrent complaints of palpitations. This did not show Afib. Patient is here for follow up today.   She continues to have episodes of sharp chest pain lasting for few minutes at a time, sometimes with exertion. She is very concerned about these episodes being related to her heart.    Past Medical History:  Diagnosis Date  . Anemia    pt denies  . Arthritis   . Atrial fibrillation (HCC)   . Bradycardia   . Bronchitis    hx of   . Chest pain 06-10-14   had chest pain thia am  . Dizziness   . Fibromyalgia   . 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 GERD (gastroesophageal reflux disease)   . H/O hiatal hernia   . Hearing loss    left ear   . History of uterine cancer 1975   s/p hysterectomy  . Hypertension   . Numbness and tingling    hands and feet bilat   . Pneumonia yrs ago  . Shortness of breath dyspnea   . Syncope 07/16/2018  . Tinnitus   . Urge urinary incontinence   . Urinary incontinence   . Vitamin D deficiency      Past Surgical History:  Procedure Laterality Date  . CERVICAL FUSION  2011  . CHOLECYSTECTOMY N/A 06/14/2014   Procedure: LAPAROSCOPIC CHOLECYSTECTOMY WITH attempted INTRAOPERATIVE CHOLANGIOGRAM;  Surgeon: Kandis Cockingavid H Newman, MD;  Location: [facility];  Service: General;  Laterality: N/A;  . INSERTION OF MESH N/A 07/18/2015   Procedure: INSERTION OF MESH;  Surgeon: Ovidio Kinavid Newman, MD;  Location: [facility];  Service: General;  Laterality: N/A;  . KNEE CLOSED REDUCTION Left 09/21/2013   Procedure: CLOSED MANIPULATION LEFT KNEE;   Surgeon: Shelda PalMatthew D Olin, MD;  [facility];  Service: Orthopedics;  Laterality: Left;  . TOTAL ABDOMINAL HYSTERECTOMY W/ BILATERAL SALPINGOOPHORECTOMY  1972  . TOTAL KNEE ARTHROPLASTY Left 08/03/2013   Procedure: LEFT TOTAL KNEE ARTHROPLASTY;  Surgeon: Shelda PalMatthew D Olin, MD;  Location: [facility];  Service: Orthopedics;  Laterality: Left;  . tracheotomy    . TYMPANOPLASTY Left   . VENTRAL HERNIA REPAIR N/A 07/18/2015   Procedure: LAPAROSCOPIC VENTRAL AND UMBILICAL  HERNIA LYSIS OF ADHESIONS;  Surgeon: Ovidio Kinavid Newman, MD;  Location: [facility];  Service: General;  Laterality: N/A;     Social History   Socioeconomic History  . Marital status: Married    Spouse name: Not on file  . Number of children: 4  . Years of education: Not on file  . Highest education level: Not on file  Occupational History  . Not on file  Social Needs  . Financial resource strain: Not on file  . Food insecurity    Worry: Not on file    Inability: Not on file  . Transportation needs    Medical: Not on file    Non-medical: Not on file  Tobacco Use  . Smoking status: Never Smoker  . Smokeless tobacco: Never Used  Substance and Sexual Activity  .  Alcohol use: No  . Drug use: No  . Sexual activity: Not on file  Lifestyle  . Physical activity    Days per week: Not on file    Minutes per session: Not on file  . Stress: Not on file  Relationships  . Social Herbalist on phone: Not on file    Gets together: Not on file    Attends religious service: Not on file    Active member of club or organization: Not on file    Attends meetings of clubs or organizations: Not on file    Relationship status: Not on file  . Intimate partner violence    Fear of current or ex partner: Not on file    Emotionally abused: Not on file    Physically abused: Not on file    Forced sexual activity: Not on file  Other Topics Concern  . Not on file  Social History Narrative  . Not on file     Family  History  Problem Relation Age of Onset  . Stroke Mother   . Stroke Father      Current Outpatient Medications on File Prior to Visit  Medication Sig Dispense Refill  . acetaminophen (TYLENOL) 500 MG tablet Take 500 mg by mouth every 6 (six) hours as needed for mild pain, moderate pain, fever or headache.     . albuterol (PROVENTIL HFA;VENTOLIN HFA) 108 (90 Base) MCG/ACT inhaler Inhale 2 puffs into the lungs every 4 (four) hours as needed for wheezing or shortness of breath.     Marland Kitchen apixaban (ELIQUIS) 5 MG TABS tablet Take 1 tablet (5 mg total) by mouth 2 (two) times daily. 180 tablet 2  . cholecalciferol (VITAMIN D) 1000 units tablet Take 2,000 Units by mouth daily.     Marland Kitchen diltiazem (CARDIZEM CD) 120 MG 24 hr capsule Take 1 capsule (120 mg total) by mouth daily. 30 capsule 2  . furosemide (LASIX) 20 MG tablet Take 1 tablet (20 mg total) by mouth daily. 30 tablet 0  . OVER THE COUNTER MEDICATION Apply 1 application topically daily as needed (itching). OTC cream for itching    . potassium chloride (KLOR-CON) 8 MEQ tablet Take 8 mEq by mouth daily.      No current facility-administered medications on file prior to visit.     Cardiovascular studies:  Event monitor 04/28/2019 - 05/11/2019:  1 auto triggered event shows sinus rhythm. No patient triggered events seen. No Afib.     EKG 04/28/2019: Sinus rhythm 74 bpm.  Early R wave transition. Otherwise normal EKG.  Echocardiogram 02/16/2019:  1. The left ventricle has normal systolic function with an ejection fraction of 60-65%. The cavity size was normal. There is mild concentric left ventricular hypertrophy. Diastolic function could not be assessed due to atrial fibrillation.  2. Low normal RV systolic function.  3. Mild left atrial dilatation. Moderate right atrial dilatation.  4. Mild to moderate mitral and tricuspid regurgitation. RVSP 29 mmHg.  5. Vavlular regurgitation and chamber dilatation new since previous echocardiogram on  07/17/2018.  Recent labs: Results for CLARKE, AMBURN (MRN [id number]) as of 04/28/2019 12:21  Ref. Range 02/14/2019 17:25 02/14/2019 17:26 02/14/2019 20:21 02/15/2019 40:81  BASIC METABOLIC PANEL Unknown    Rpt (A)  COMPREHENSIVE METABOLIC PANEL Unknown Rpt (A)     Sodium Latest Ref Range: 135 - 145 mmol/L 135   135  Potassium Latest Ref Range: 3.5 - 5.1 mmol/L 3.8   3.7  Chloride  Latest Ref Range: 98 - 111 mmol/L 103   107  CO2 Latest Ref Range: 22 - 32 mmol/L 24   21 (L)  Glucose Latest Ref Range: 70 - 99 mg/dL 91   045211 (H)  BUN Latest Ref Range: 8 - 23 mg/dL 14   12  Creatinine Latest Ref Range: 0.44 - 1.00 mg/dL 4.090.80   8.110.66  Calcium Latest Ref Range: 8.9 - 10.3 mg/dL 8.5 (L)   8.4 (L)  Anion gap Latest Ref Range: 5 - 15  8   7   Magnesium Latest Ref Range: 1.7 - 2.4 mg/dL    2.0  Alkaline Phosphatase Latest Ref Range: 38 - 126 U/L 58     Albumin Latest Ref Range: 3.5 - 5.0 g/dL 3.4 (L)     AST Latest Ref Range: 15 - 41 U/L 30     ALT Latest Ref Range: 0 - 44 U/L 18     Total Protein Latest Ref Range: 6.5 - 8.1 g/dL 7.1     Total Bilirubin Latest Ref Range: 0.3 - 1.2 mg/dL 1.1     GFR, Est Non African American Latest Ref Range: >60 mL/min >60   >60  GFR, Est African American Latest Ref Range: >60 mL/min >60   >60  B Natriuretic Peptide Latest Ref Range: 0.0 - 100.0 pg/mL  84.2    Troponin i, poc Latest Ref Range: 0.00 - 0.08 ng/mL   0.01    Results for Loncar, Earnestine MealingMATTIE L (MRN [id number]) as of 04/28/2019 12:21  Ref. Range 02/16/2019 03:26  WBC Latest Ref Range: 4.0 - 10.5 K/uL 4.8  RBC Latest Ref Range: 3.87 - 5.11 MIL/uL 4.04  Hemoglobin Latest Ref Range: 12.0 - 15.0 g/dL 21.312.4  HCT Latest Ref Range: 36.0 - 46.0 % 37.4  MCV Latest Ref Range: 80.0 - 100.0 fL 92.6  MCH Latest Ref Range: 26.0 - 34.0 pg 30.7  MCHC Latest Ref Range: 30.0 - 36.0 g/dL 08.633.2  RDW Latest Ref Range: 11.5 - 15.5 % 11.9  Platelets Latest Ref Range: 150 - 400 K/uL 216  nRBC Latest Ref Range: 0.0 - 0.2 % 0.0    Results for Brenes, Earnestine MealingMATTIE L (MRN [id number]) as of 04/28/2019 12:21  Ref. Range 02/15/2019 00:44  Glucose Latest Ref Range: 70 - 99 mg/dL 528211 (H)  TSH Latest Ref Range: 0.350 - 4.500 uIU/mL 0.568    Review of Systems  Constitution: Positive for malaise/fatigue. Negative for decreased appetite, weight gain and weight loss.  HENT: Negative for congestion.   Eyes: Negative for visual disturbance.  Cardiovascular: Positive for palpitations. Negative for chest pain, dyspnea on exertion, leg swelling and syncope.  Respiratory: Negative for cough.   Endocrine: Negative for cold intolerance.  Hematologic/Lymphatic: Does not bruise/bleed easily.  Skin: Negative for itching and rash.  Musculoskeletal: Negative for myalgias.  Gastrointestinal: Negative for abdominal pain, nausea and vomiting.  Genitourinary: Negative for dysuria.  Neurological: Negative for dizziness and weakness.  Psychiatric/Behavioral: The patient is not nervous/anxious.   All other systems reviewed and are negative.        Vitals:   05/20/19 1158  BP: (!) 165/83  Pulse: 74  Temp: (!) 97.3 F (36.3 C)  SpO2: 98%    Body mass index is 34.69 kg/m. Filed Weights   05/20/19 1158  Weight: 214 lb 14.4 oz (97.5 kg)    Objective:   Physical Exam  Constitutional: She is oriented to person, place, and time. She appears well-developed and well-nourished. No distress.  HENT:  Head: Normocephalic and atraumatic.  Eyes: Pupils are equal, round, and reactive to light. Conjunctivae are normal.  Neck: No JVD present.  Cardiovascular: Normal rate, regular rhythm and intact distal pulses.  Pulmonary/Chest: Effort normal and breath sounds normal. She has no wheezes. She has no rales.  Abdominal: Soft. Bowel sounds are normal. There is no rebound.  Musculoskeletal:        General: No edema.  Lymphadenopathy:    She has no cervical adenopathy.  Neurological: She is alert and oriented to person, place, and time. No cranial  nerve deficit.  Skin: Skin is warm and dry.  Psychiatric: She has a normal mood and affect.  Nursing note and vitals reviewed.         Assessment & Recommendations:   Assessment & Recommendations:  78 y.o.African Americanfemalewith hypertension, fibromyalgia, hospital admission for Afib RVR in 01/2019 in the setting of influenza illness, now with palpitations  Atypical chest pain: Recurrent symptoms in patient with CAD risk factors. Recommend lexiscan nuclear stress test.   Paroxysmal Afib: Paroxysmal. No Afib seen on event monitor. CHA2DS2VASc score 4. Annual stroke risk 5%. Continue eliuis 5 mg bid.  Increase diltiazem to 240 mg once daily.  Hypertension: Suboptimal control today. Increased diltiazem as above.   Elder NegusManish J Jaydin Boniface, MD [facility] Pager: [phone] Office: [phone] If no answer Cell [phone]

## 2019-05-21 ENCOUNTER — Encounter: Payer: Self-pay | Admitting: Cardiology

## 2019-06-02 ENCOUNTER — Other Ambulatory Visit: Payer: Self-pay

## 2019-06-02 DIAGNOSIS — Z20822 Contact with and (suspected) exposure to covid-19: Secondary | ICD-10-CM

## 2019-06-06 LAB — NOVEL CORONAVIRUS, NAA: SARS-CoV-2, NAA: NOT DETECTED

## 2019-06-07 ENCOUNTER — Other Ambulatory Visit: Payer: Self-pay

## 2019-06-07 ENCOUNTER — Ambulatory Visit (INDEPENDENT_AMBULATORY_CARE_PROVIDER_SITE_OTHER): Payer: Medicare Other

## 2019-06-07 DIAGNOSIS — R0789 Other chest pain: Secondary | ICD-10-CM | POA: Diagnosis not present

## 2019-06-07 DIAGNOSIS — I480 Paroxysmal atrial fibrillation: Secondary | ICD-10-CM | POA: Diagnosis not present

## 2019-06-10 ENCOUNTER — Other Ambulatory Visit: Payer: Self-pay

## 2019-06-10 DIAGNOSIS — I480 Paroxysmal atrial fibrillation: Secondary | ICD-10-CM

## 2019-06-10 MED ORDER — DILTIAZEM HCL ER COATED BEADS 240 MG PO CP24: 240.0000 mg | ORAL_CAPSULE | Freq: Every day | ORAL | 3 refills | Status: DC

## 2019-06-15 ENCOUNTER — Other Ambulatory Visit: Payer: Self-pay

## 2019-06-15 DIAGNOSIS — I480 Paroxysmal atrial fibrillation: Secondary | ICD-10-CM

## 2019-06-15 MED ORDER — DILTIAZEM HCL ER COATED BEADS 240 MG PO CP24: 240.0000 mg | ORAL_CAPSULE | Freq: Every day | ORAL | 3 refills | Status: DC

## 2019-06-16 ENCOUNTER — Encounter: Payer: Self-pay | Admitting: Cardiology

## 2019-06-16 ENCOUNTER — Other Ambulatory Visit: Payer: Self-pay

## 2019-06-16 ENCOUNTER — Ambulatory Visit (INDEPENDENT_AMBULATORY_CARE_PROVIDER_SITE_OTHER): Payer: Medicare Other | Admitting: Cardiology

## 2019-06-16 VITALS — BP 130/88 | HR 84

## 2019-06-16 DIAGNOSIS — I10 Essential (primary) hypertension: Secondary | ICD-10-CM

## 2019-06-16 DIAGNOSIS — I480 Paroxysmal atrial fibrillation: Secondary | ICD-10-CM

## 2019-06-16 DIAGNOSIS — R0789 Other chest pain: Secondary | ICD-10-CM | POA: Diagnosis not present

## 2019-06-16 NOTE — Progress Notes (Signed)
Follow up visit  Subjective:   Phyllis Knight, female    DOB: 06/09/1941, 78 y.o.   MRN: [id number]   Chief Complaint  Patient presents with  . Chest Pain    HPI  78 y.o.African Americanfemalewith hypertension, paroxysmal atrial fibrillation, fibromyalgia.  Patient underwent nuclear stress test due to complaints of chest pain. Findings below. She has not had any recurrence of chest pain symptoms. Palpitations are controlled with symptoms.   Past Medical History:  Diagnosis Date  . Anemia    pt denies  . Arthritis   . Atrial fibrillation (HCC)   . Bradycardia   . Bronchitis    hx of   . Chest pain 06-10-14   had chest pain thia am  . Dizziness   . Fibromyalgia   . 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 GERD (gastroesophageal reflux disease)   . H/O hiatal hernia   . Hearing loss    left ear   . History of uterine cancer 1975   s/p hysterectomy  . Hypertension   . Numbness and tingling    hands and feet bilat   . Pneumonia yrs ago  . Shortness of breath dyspnea   . Syncope 07/16/2018  . Tinnitus   . Urge urinary incontinence   . Urinary incontinence   . Vitamin D deficiency      Past Surgical History:  Procedure Laterality Date  . CERVICAL FUSION  2011  . CHOLECYSTECTOMY N/A 06/14/2014   Procedure: LAPAROSCOPIC CHOLECYSTECTOMY WITH attempted INTRAOPERATIVE CHOLANGIOGRAM;  Surgeon: Kandis Cockingavid H Newman, MD;  Location: [facility];  Service: General;  Laterality: N/A;  . INSERTION OF MESH N/A 07/18/2015   Procedure: INSERTION OF MESH;  Surgeon: Ovidio Kinavid Newman, MD;  Location: [facility];  Service: General;  Laterality: N/A;  . KNEE CLOSED REDUCTION Left 09/21/2013   Procedure: CLOSED MANIPULATION LEFT KNEE;  Surgeon: Shelda PalMatthew D Olin, MD;  [facility];  Service: Orthopedics;  Laterality: Left;  . TOTAL ABDOMINAL HYSTERECTOMY W/ BILATERAL SALPINGOOPHORECTOMY  1972  . TOTAL KNEE ARTHROPLASTY Left 08/03/2013   Procedure: LEFT TOTAL  KNEE ARTHROPLASTY;  Surgeon: Shelda PalMatthew D Olin, MD;  Location: [facility];  Service: Orthopedics;  Laterality: Left;  . tracheotomy    . TYMPANOPLASTY Left   . VENTRAL HERNIA REPAIR N/A 07/18/2015   Procedure: LAPAROSCOPIC VENTRAL AND UMBILICAL  HERNIA LYSIS OF ADHESIONS;  Surgeon: Ovidio Kinavid Newman, MD;  Location: [facility];  Service: General;  Laterality: N/A;     Social History   Socioeconomic History  . Marital status: Married    Spouse name: Not on file  . Number of children: 4  . Years of education: Not on file  . Highest education level: Not on file  Occupational History  . Not on file  Social Needs  . Financial resource strain: Not on file  . Food insecurity    Worry: Not on file    Inability: Not on file  . Transportation needs    Medical: Not on file    Non-medical: Not on file  Tobacco Use  . Smoking status: Never Smoker  . Smokeless tobacco: Never Used  Substance and Sexual Activity  . Alcohol use: No  . Drug use: No  . Sexual activity: Not on file  Lifestyle  . Physical activity    Days per week: Not on file    Minutes per session: Not on file  . Stress: Not on file  Relationships  .  Social Musicianconnections    Talks on phone: Not on file    Gets together: Not on file    Attends religious service: Not on file    Active member of club or organization: Not on file    Attends meetings of clubs or organizations: Not on file    Relationship status: Not on file  . Intimate partner violence    Fear of current or ex partner: Not on file    Emotionally abused: Not on file    Physically abused: Not on file    Forced sexual activity: Not on file  Other Topics Concern  . Not on file  Social History Narrative  . Not on file     Family History  Problem Relation Age of Onset  . Stroke Mother   . Stroke Father      Current Outpatient Medications on File Prior to Visit  Medication Sig Dispense Refill  . acetaminophen (TYLENOL) 500 MG tablet Take 500 mg by mouth every 6 (six)  hours as needed for mild pain, moderate pain, fever or headache.     . albuterol (PROVENTIL HFA;VENTOLIN HFA) 108 (90 Base) MCG/ACT inhaler Inhale 2 puffs into the lungs every 4 (four) hours as needed for wheezing or shortness of breath.     . cholecalciferol (VITAMIN D) 1000 units tablet Take 2,000 Units by mouth daily.     Marland Kitchen. diltiazem (CARDIZEM CD) 240 MG 24 hr capsule Take 1 capsule (240 mg total) by mouth daily. 90 capsule 3  . ELIQUIS 5 MG TABS tablet 2 (two) times a day. bid    . furosemide (LASIX) 20 MG tablet Take 1 tablet (20 mg total) by mouth daily. 30 tablet 0  . nitroGLYCERIN (NITROSTAT) 0.4 MG SL tablet Place 1 tablet (0.4 mg total) under the tongue every 5 (five) minutes as needed for chest pain. 30 tablet 3  . OVER THE COUNTER MEDICATION Apply 1 application topically daily as needed (itching). OTC cream for itching    . pantoprazole (PROTONIX) 40 MG tablet daily.    . potassium chloride (KLOR-CON) 8 MEQ tablet Take 8 mEq by mouth daily.      No current facility-administered medications on file prior to visit.     Cardiovascular studies:  Lexiscan Myoview stress test 06/07/2019: Lexiscan stress test was performed. Stress EKG is non-diagnostic, as this is pharmacological stress test. In addition, Rest and stress EKG reveal sinus rhythm, frequent PVC's and occasional PAC's.  SPECT images revealed small sized, mildly reversible, mild intensity perfusion defect in basal inferior myocardium. While breast attenuation is possible (imaging performed in sitting position), small area of ischemia cannot be excluded. LVEF 73% with normal wall motion. Low risk study.   Event monitor 04/28/2019 - 05/11/2019:  1 auto triggered event shows sinus rhythm. No patient triggered events seen. No Afib.    EKG 04/28/2019: Sinus rhythm 74 bpm.  Early R wave transition. Otherwise normal EKG.  Echocardiogram 02/16/2019:  1. The left ventricle has normal systolic function with an ejection fraction  of 60-65%. The cavity size was normal. There is mild concentric left ventricular hypertrophy. Diastolic function could not be assessed due to atrial fibrillation.  2. Low normal RV systolic function.  3. Mild left atrial dilatation. Moderate right atrial dilatation.  4. Mild to moderate mitral and tricuspid regurgitation. RVSP 29 mmHg.  5. Vavlular regurgitation and chamber dilatation new since previous echocardiogram on 07/17/2018.  Recent labs: Results for Phyllis ForsterMCNEILL, Camelle L (MRN [id number]) as of 04/28/2019 12:21  Ref. Range 02/14/2019 17:25 02/14/2019 17:26 02/14/2019 20:21 02/15/2019 00:44  BASIC METABOLIC PANEL Unknown    Rpt (A)  COMPREHENSIVE METABOLIC PANEL Unknown Rpt (A)     Sodium Latest Ref Range: 135 - 145 mmol/L 135   135  Potassium Latest Ref Range: 3.5 - 5.1 mmol/L 3.8   3.7  Chloride Latest Ref Range: 98 - 111 mmol/L 103   107  CO2 Latest Ref Range: 22 - 32 mmol/L 24   21 (L)  Glucose Latest Ref Range: 70 - 99 mg/dL 91   119211 (H)  BUN Latest Ref Range: 8 - 23 mg/dL 14   12  Creatinine Latest Ref Range: 0.44 - 1.00 mg/dL 1.470.80   8.290.66  Calcium Latest Ref Range: 8.9 - 10.3 mg/dL 8.5 (L)   8.4 (L)  Anion gap Latest Ref Range: 5 - 15  8   7   Magnesium Latest Ref Range: 1.7 - 2.4 mg/dL    2.0  Alkaline Phosphatase Latest Ref Range: 38 - 126 U/L 58     Albumin Latest Ref Range: 3.5 - 5.0 g/dL 3.4 (L)     AST Latest Ref Range: 15 - 41 U/L 30     ALT Latest Ref Range: 0 - 44 U/L 18     Total Protein Latest Ref Range: 6.5 - 8.1 g/dL 7.1     Total Bilirubin Latest Ref Range: 0.3 - 1.2 mg/dL 1.1     GFR, Est Non African American Latest Ref Range: >60 mL/min >60   >60  GFR, Est African American Latest Ref Range: >60 mL/min >60   >60  B Natriuretic Peptide Latest Ref Range: 0.0 - 100.0 pg/mL  84.2    Troponin i, poc Latest Ref Range: 0.00 - 0.08 ng/mL   0.01    Results for Kidney, Earnestine MealingMATTIE L (MRN [id number]) as of 04/28/2019 12:21  Ref. Range 02/16/2019 03:26  WBC Latest Ref Range: 4.0 -  10.5 K/uL 4.8  RBC Latest Ref Range: 3.87 - 5.11 MIL/uL 4.04  Hemoglobin Latest Ref Range: 12.0 - 15.0 g/dL 78.412.4  HCT Latest Ref Range: 36.0 - 46.0 % 37.4  MCV Latest Ref Range: 80.0 - 100.0 fL 92.6  MCH Latest Ref Range: 26.0 - 34.0 pg 30.7  MCHC Latest Ref Range: 30.0 - 36.0 g/dL 69.633.2  RDW Latest Ref Range: 11.5 - 15.5 % 11.9  Platelets Latest Ref Range: 150 - 400 K/uL 216  nRBC Latest Ref Range: 0.0 - 0.2 % 0.0   Results for Newsom, Earnestine MealingMATTIE L (MRN [id number]) as of 04/28/2019 12:21  Ref. Range 02/15/2019 00:44  Glucose Latest Ref Range: 70 - 99 mg/dL 440211 (H)  TSH Latest Ref Range: 0.350 - 4.500 uIU/mL 0.568    Review of Systems  Constitution: Positive for malaise/fatigue. Negative for decreased appetite, weight gain and weight loss.  HENT: Negative for congestion.   Eyes: Negative for visual disturbance.  Cardiovascular: Positive for palpitations. Negative for chest pain, dyspnea on exertion, leg swelling and syncope.  Respiratory: Negative for cough.   Endocrine: Negative for cold intolerance.  Hematologic/Lymphatic: Does not bruise/bleed easily.  Skin: Negative for itching and rash.  Musculoskeletal: Negative for myalgias.  Gastrointestinal: Negative for abdominal pain, nausea and vomiting.  Genitourinary: Negative for dysuria.  Neurological: Negative for dizziness and weakness.  Psychiatric/Behavioral: The patient is not nervous/anxious.   All other systems reviewed and are negative.        Vitals:   06/16/19 1538  BP: 130/88  Pulse: 84  Objective:   Physical Exam  Constitutional: She is oriented to person, place, and time. She appears well-developed and well-nourished. No distress.  HENT:  Head: Normocephalic and atraumatic.  Eyes: Pupils are equal, round, and reactive to light. Conjunctivae are normal.  Neck: No JVD present.  Cardiovascular: Normal rate, regular rhythm and intact distal pulses.  Pulmonary/Chest: Effort normal and breath sounds normal. She  has no wheezes. She has no rales.  Abdominal: Soft. Bowel sounds are normal. There is no rebound.  Musculoskeletal:        General: No edema.  Lymphadenopathy:    She has no cervical adenopathy.  Neurological: She is alert and oriented to person, place, and time. No cranial nerve deficit.  Skin: Skin is warm and dry.  Psychiatric: She has a normal mood and affect.  Nursing note and vitals reviewed.         Assessment & Recommendations:   Assessment & Recommendations:  78 y.o.African Americanfemalewith hypertension, fibromyalgia, hospital admission for Afib RVR in 01/2019 in the setting of influenza illness, now with palpitations  Atypical chest pain: Low risk stress test findings. Continue medical therapy.   Paroxysmal Afib: Paroxysmal. No Afib seen on event monitor. CHA2DS2VASc score 4. Annual stroke risk 5%. Continue eliuis 5 mg bid.  Continue diltiazem to 240 mg once daily.  Hypertension: Well controlled.  Follow up in 6 months.    [name], MD [facility] Pager: [phone] Office: [phone] If no answer Cell [phone]

## 2019-08-25 ENCOUNTER — Other Ambulatory Visit: Payer: Self-pay

## 2019-08-25 MED ORDER — ELIQUIS 5 MG PO TABS: 5.0000 mg | ORAL_TABLET | Freq: Two times a day (BID) | ORAL | 1 refills | Status: DC

## 2019-12-10 ENCOUNTER — Telehealth: Payer: Self-pay

## 2019-12-10 MED ORDER — METOPROLOL SUCCINATE ER 50 MG PO TB24: 50.0000 mg | ORAL_TABLET | Freq: Every day | ORAL | 3 refills | Status: DC

## 2019-12-10 NOTE — Telephone Encounter (Signed)
[facility] mail order pharmacy is out of Diiltiazem (CARDIZEM CD) 240 MG 24 hr capsule for the long term. I confirmed this with the [facility] Patient can not afford to get this rx at local pharmacy. Can you prescribe the patient something else to take its place? .Please advise.

## 2019-12-10 NOTE — Telephone Encounter (Signed)
Metoprolol sent in to [facility]

## 2019-12-10 NOTE — Telephone Encounter (Signed)
Once she finishes diltiazem, switch to metoprolol succinate 50 mg daily 30 pills X 3 refills

## 2019-12-14 NOTE — Progress Notes (Signed)
Follow up visit  Subjective:   Phyllis Knight, female    DOB: [birth year], 79 y.o.   MRN: [id number]   Chief Complaint  Patient presents with  . Chest Pain  . Atrial Fibrillation  . Hypertension  . Follow-up    6 month    HPI  79 y.o.African Americanfemalewith hypertension, fibromyalgia, hospital admission for Afib RVR in 01/2019 in the setting of influenza illness.  Patient has occasional palpitations. She also notes pain and swelling in her legs. She denies significant shortness of breath, chest pain.    Current Outpatient Medications on File Prior to Visit  Medication Sig Dispense Refill  . acetaminophen (TYLENOL) 500 MG tablet Take 500 mg by mouth every 6 (six) hours as needed for mild pain, moderate pain, fever or headache.     . albuterol (PROVENTIL HFA;VENTOLIN HFA) 108 (90 Base) MCG/ACT inhaler Inhale 2 puffs into the lungs every 4 (four) hours as needed for wheezing or shortness of breath.     . cholecalciferol (VITAMIN D) 1000 units tablet Take 2,000 Units by mouth daily.     Marland Kitchen ELIQUIS 5 MG TABS tablet Take 1 tablet (5 mg total) by mouth 2 (two) times daily. bid 180 tablet 1  . furosemide (LASIX) 20 MG tablet Take 1 tablet (20 mg total) by mouth daily. 30 tablet 0  . metoprolol succinate (TOPROL XL) 50 MG 24 hr tablet Take 1 tablet (50 mg total) by mouth daily. Take with or immediately following a meal. 30 tablet 3  . nitroGLYCERIN (NITROSTAT) 0.4 MG SL tablet Place 1 tablet (0.4 mg total) under the tongue every 5 (five) minutes as needed for chest pain. 30 tablet 3  . pantoprazole (PROTONIX) 40 MG tablet daily.    . potassium chloride (KLOR-CON) 8 MEQ tablet Take 8 mEq by mouth daily.      No current facility-administered medications on file prior to visit.    Cardiovascular studies:  EKG 12/15/2019: Sinus rhythm 70 bpm. Frequent PAC and PVC  Lexiscan Myoview stress test 06/07/2019: Lexiscan stress test was performed. Stress EKG is non-diagnostic, as this  is pharmacological stress test. In addition, Rest and stress EKG reveal sinus rhythm, frequent PVC's and occasional PAC's.  SPECT images revealed small sized, mildly reversible, mild intensity perfusion defect in basal inferior myocardium. While breast attenuation is possible (imaging performed in sitting position), small area of ischemia cannot be excluded. LVEF 73% with normal wall motion. Low risk study.   Event monitor 04/28/2019 - 05/11/2019:  1 auto triggered event shows sinus rhythm. No patient triggered events seen. No Afib.    EKG 04/28/2019: Sinus rhythm 74 bpm.  Early R wave transition. Otherwise normal EKG.  Echocardiogram 02/16/2019:  1. The left ventricle has normal systolic function with an ejection fraction of 60-65%. The cavity size was normal. There is mild concentric left ventricular hypertrophy. Diastolic function could not be assessed due to atrial fibrillation.  2. Low normal RV systolic function.  3. Mild left atrial dilatation. Moderate right atrial dilatation.  4. Mild to moderate mitral and tricuspid regurgitation. RVSP 29 mmHg.  5. Vavlular regurgitation and chamber dilatation new since previous echocardiogram on 07/17/2018.  Recent labs: 02/15/2019: Glucose 211, BUN/Cr 12/0.66. EGFR >60. Na/K 135/3.7. Rest of the CMP normal H/H 12/37. MCV 92. Platelets 216 TSH 0.5normal    Review of Systems  Cardiovascular: Negative for chest pain, dyspnea on exertion, leg swelling, palpitations and syncope.       Vitals:   12/15/19 1013  BP: (!) 129/96  Pulse: 62  Temp: 98 F (36.7 C)     Objective:   Physical Exam  Constitutional: She appears well-developed and well-nourished.  Neck: No JVD present.  Cardiovascular: Normal rate, regular rhythm, normal heart sounds and intact distal pulses.  No murmur heard. Pulmonary/Chest: Effort normal and breath sounds normal. She has no wheezes. She has no rales.  Musculoskeletal:        General: Edema (Mild b/l)  present.  Nursing note and vitals reviewed.         Assessment & Recommendations:   Assessment & Recommendations:  79 y.o.African Americanfemalewith hypertension, fibromyalgia, hospital admission for Afib RVR in 01/2019 in the setting of influenza illness, now with palpitations  Atypical chest pain: Low risk stress test findings. Continue medical therapy.  Check lipid panel.  Paroxysmal Afib: Paroxysmal. No Afib seen on event monitor. CHA2DS2VASc score 4. Annual stroke risk 5%. Continue eliqis 5 mg bid.  Switched diltiazem 240 daily to metoprolol succinate 50 mg daily, due to inavailability of diltiazem with patient's pharmacy.  Leg edema: Lasix 20 mg daily. Check BNP, BMP.  Hypertension: Well controlled.  Follow up in 6 months.    [name], MD [facility] Pager: [phone] Office: [phone] If no answer Cell [phone]

## 2019-12-15 ENCOUNTER — Ambulatory Visit (INDEPENDENT_AMBULATORY_CARE_PROVIDER_SITE_OTHER): Payer: Medicare Other | Admitting: Cardiology

## 2019-12-15 ENCOUNTER — Telehealth: Payer: Self-pay

## 2019-12-15 ENCOUNTER — Other Ambulatory Visit: Payer: Self-pay

## 2019-12-15 ENCOUNTER — Other Ambulatory Visit (HOSPITAL_COMMUNITY): Payer: Self-pay | Admitting: Cardiology

## 2019-12-15 ENCOUNTER — Encounter: Payer: Self-pay | Admitting: Cardiology

## 2019-12-15 VITALS — BP 129/96 | HR 62 | Temp 98.0°F | Ht 66.0 in | Wt 229.9 lb

## 2019-12-15 DIAGNOSIS — R0789 Other chest pain: Secondary | ICD-10-CM | POA: Diagnosis not present

## 2019-12-15 DIAGNOSIS — I10 Essential (primary) hypertension: Secondary | ICD-10-CM

## 2019-12-15 DIAGNOSIS — I480 Paroxysmal atrial fibrillation: Secondary | ICD-10-CM | POA: Diagnosis not present

## 2019-12-15 DIAGNOSIS — R600 Localized edema: Secondary | ICD-10-CM | POA: Diagnosis not present

## 2019-12-15 MED ORDER — FUROSEMIDE 20 MG PO TABS: 20.0000 mg | ORAL_TABLET | Freq: Every day | ORAL | 1 refills | Status: DC

## 2019-12-15 MED ORDER — RIVAROXABAN 20 MG PO TABS: 20.0000 mg | ORAL_TABLET | Freq: Every day | ORAL | 3 refills | Status: DC

## 2019-12-15 NOTE — Patient Instructions (Signed)
Stop Eliquis Start Xarelto 20 mg daily with supper today ([facility] prescription to [facility]) Take lasix 20 mg once daily (Sent prescription to [facility] and [facility])

## 2019-12-15 NOTE — Telephone Encounter (Signed)
Patient cannot afford Xarelto, she would like an alternative.

## 2019-12-15 NOTE — Telephone Encounter (Signed)
Resent lasix to [facility]

## 2019-12-15 NOTE — Telephone Encounter (Signed)
Please try patient assistance.  Thanks MJP

## 2019-12-16 ENCOUNTER — Telehealth: Payer: Self-pay

## 2019-12-16 LAB — BASIC METABOLIC PANEL
BUN/Creatinine Ratio: 13 (ref 12–28)
BUN: 9 mg/dL (ref 8–27)
CO2: 25 mmol/L (ref 20–29)
Calcium: 9.3 mg/dL (ref 8.7–10.3)
Chloride: 102 mmol/L (ref 96–106)
Creatinine, Ser: 0.72 mg/dL (ref 0.57–1.00)
GFR calc Af Amer: 92 mL/min/{1.73_m2} (ref >59)
GFR calc non Af Amer: 80 mL/min/{1.73_m2} (ref >59)
Glucose: 87 mg/dL (ref 65–99)
Potassium: 4.5 mmol/L (ref 3.5–5.2)
Sodium: 140 mmol/L (ref 134–144)

## 2019-12-16 LAB — BRAIN NATRIURETIC PEPTIDE: BNP: 7.5 pg/mL (ref 0.0–100.0)

## 2019-12-16 LAB — LIPID PANEL
Chol/HDL Ratio: 4.4 ratio (ref 0.0–4.4)
Cholesterol, Total: 207 mg/dL — ABNORMAL HIGH (ref 100–199)
HDL: 47 mg/dL (ref >39)
LDL Chol Calc (NIH): 139 mg/dL — ABNORMAL HIGH (ref 0–99)
Triglycerides: 119 mg/dL (ref 0–149)
VLDL Cholesterol Cal: 21 mg/dL (ref 5–40)

## 2019-12-21 ENCOUNTER — Telehealth: Payer: Self-pay

## 2019-12-21 NOTE — Telephone Encounter (Signed)
Spoke with the patient. Please work on patient assistance for Xarelto.  Thanks MJP

## 2019-12-21 NOTE — Telephone Encounter (Signed)
Pt wants to know if you can give her a call once your done seeing patients; shes upset her xarelto is 226$ and I told her ill mail her a application to see if she can qualify for free medicine; Please call her when you can [phone]

## 2019-12-22 NOTE — Telephone Encounter (Signed)
Sent her the application in the mail 12/21/2019

## 2019-12-30 ENCOUNTER — Encounter: Payer: Self-pay | Admitting: Cardiology

## 2019-12-30 ENCOUNTER — Ambulatory Visit (INDEPENDENT_AMBULATORY_CARE_PROVIDER_SITE_OTHER): Payer: Medicare Other | Admitting: Cardiology

## 2019-12-30 ENCOUNTER — Other Ambulatory Visit: Payer: Self-pay

## 2019-12-30 VITALS — BP 194/103 | HR 52 | Temp 97.3°F | Ht 66.0 in | Wt 217.9 lb

## 2019-12-30 DIAGNOSIS — I480 Paroxysmal atrial fibrillation: Secondary | ICD-10-CM

## 2019-12-30 DIAGNOSIS — I10 Essential (primary) hypertension: Secondary | ICD-10-CM

## 2019-12-30 MED ORDER — ISOSORB DINITRATE-HYDRALAZINE 20-37.5 MG PO TABS: 1.0000 | ORAL_TABLET | ORAL | Status: DC

## 2019-12-30 NOTE — Progress Notes (Signed)
Follow up visit  Subjective:   Phyllis Knight, female    DOB: 04/03/1941, 79 y.o.   MRN: [id number]   Chief Complaint  Patient presents with  . Hypertension  . Atrial Fibrillation  . Leg Swelling  . Follow-up    2 weeks    79 y.o.African Americanfemalewith hypertension, fibromyalgia, hospital admission for Afib RVR in 01/2019 in the setting of influenza illness.  At last visit, I switched her eliquis to Xarelto due to cost concerns. She did not qualify for patient assistance for Eliquis. I gave her Xarelto samples, prescription, and initiated patient assistance. I also started her on lasix due to leg edema.  Today, she is here for follow up. Blood pressure is elevated. She denies chest pain, shortness of breath, headache, nausea, blurry vision. She reported stressors at home.   Current Outpatient Medications on File Prior to Visit  Medication Sig Dispense Refill  . acetaminophen (TYLENOL) 500 MG tablet Take 500 mg by mouth every 6 (six) hours as needed for mild pain, moderate pain, fever or headache.     . albuterol (PROVENTIL HFA;VENTOLIN HFA) 108 (90 Base) MCG/ACT inhaler Inhale 2 puffs into the lungs every 4 (four) hours as needed for wheezing or shortness of breath.     . cholecalciferol (VITAMIN D) 1000 units tablet Take 2,000 Units by mouth daily.     . Colchicine 0.6 MG CAPS Take 2 capsules by mouth as directed.    . furosemide (LASIX) 20 MG tablet Take 1 tablet (20 mg total) by mouth daily. 20 tablet 1  . metoprolol succinate (TOPROL XL) 50 MG 24 hr tablet Take 1 tablet (50 mg total) by mouth daily. Take with or immediately following a meal. 30 tablet 3  . nitroGLYCERIN (NITROSTAT) 0.4 MG SL tablet Place 1 tablet (0.4 mg total) under the tongue every 5 (five) minutes as needed for chest pain. 30 tablet 3  . pantoprazole (PROTONIX) 40 MG tablet Take 40 mg by mouth daily.     . potassium chloride (KLOR-CON) 8 MEQ tablet Take 8 mEq by mouth daily.     . rivaroxaban  (XARELTO) 20 MG TABS tablet Take 1 tablet (20 mg total) by mouth daily with supper. 90 tablet 3   No current facility-administered medications on file prior to visit.    Cardiovascular studies:  EKG 12/15/2019: Sinus rhythm 70 bpm. Frequent PAC and PVC  Lexiscan Myoview stress test 06/07/2019: Lexiscan stress test was performed. Stress EKG is non-diagnostic, as this is pharmacological stress test. In addition, Rest and stress EKG reveal sinus rhythm, frequent PVC's and occasional PAC's.  SPECT images revealed small sized, mildly reversible, mild intensity perfusion defect in basal inferior myocardium. While breast attenuation is possible (imaging performed in sitting position), small area of ischemia cannot be excluded. LVEF 73% with normal wall motion. Low risk study.   Event monitor 04/28/2019 - 05/11/2019:  1 auto triggered event shows sinus rhythm. No patient triggered events seen. No Afib.    EKG 04/28/2019: Sinus rhythm 74 bpm.  Early R wave transition. Otherwise normal EKG.  Echocardiogram 02/16/2019:  1. The left ventricle has normal systolic function with an ejection fraction of 60-65%. The cavity size was normal. There is mild concentric left ventricular hypertrophy. Diastolic function could not be assessed due to atrial fibrillation.  2. Low normal RV systolic function.  3. Mild left atrial dilatation. Moderate right atrial dilatation.  4. Mild to moderate mitral and tricuspid regurgitation. RVSP 29 mmHg.  5. Vavlular regurgitation  and chamber dilatation new since previous echocardiogram on 07/17/2018.  Recent labs: 02/15/2019: Glucose 211, BUN/Cr 12/0.66. EGFR >60. Na/K 135/3.7. Rest of the CMP normal H/H 12/37. MCV 92. Platelets 216 TSH 0.5normal    Review of Systems  Cardiovascular: Negative for chest pain, dyspnea on exertion, leg swelling, palpitations and syncope.       Vitals:   12/30/19 1649 12/30/19 1707  BP: (!) 203/118 (!) 194/103  Pulse: 61 (!) 52   Temp:    SpO2:       Objective:   Physical Exam  Constitutional: She appears well-developed and well-nourished.  Neck: No JVD present.  Cardiovascular: Normal rate, regular rhythm, normal heart sounds and intact distal pulses.  No murmur heard. Pulmonary/Chest: Effort normal and breath sounds normal. She has no wheezes. She has no rales.  Musculoskeletal:        General: No edema.  Nursing note and vitals reviewed.         Assessment & Recommendations:   Assessment & Recommendations:  79 y.o.African Americanfemalewith hypertension, fibromyalgia, hospital admission for Afib RVR in 01/2019 in the setting of influenza illness  Accelerated hypertension: Much higher than usual. She is completely asymptomatic and wants to aoid going to the hospital. HR in low 60, so did not up titrate metoprolol.  I have her Bidil sample. Administered one pill in the office, and asked to take another one at 8 PM tonight, and 10 AM tomorrow morning. Will need follow up again tomorrow to reassess. If BP remains elevated, consider starting amlodipine 5 mg, instead of Bidil, for ease of administration and cost. May need evaluation for renal artery stenosis given sudden increase in BP.  Paroxysmal Afib:  Paroxysmal. No Afib seen on event monitor. CHA2DS2VASc score 4. Annual stroke risk 5%. Continue Xarelto 20 mg daily.  Leg edema: Lasix 20 mg daily.   Follow up on 1/29 with [name], NP  [name], MD [facility] Pager: [phone] Office: [phone] If no answer Cell [phone]

## 2019-12-31 ENCOUNTER — Other Ambulatory Visit: Payer: Self-pay

## 2019-12-31 ENCOUNTER — Ambulatory Visit (INDEPENDENT_AMBULATORY_CARE_PROVIDER_SITE_OTHER): Payer: Medicare Other | Admitting: Cardiology

## 2019-12-31 VITALS — BP 149/83 | HR 69 | Temp 96.5°F | Ht 66.0 in | Wt 218.3 lb

## 2019-12-31 DIAGNOSIS — I10 Essential (primary) hypertension: Secondary | ICD-10-CM

## 2019-12-31 MED ORDER — AMLODIPINE BESYLATE 5 MG PO TABS: 5.0000 mg | ORAL_TABLET | Freq: Every day | ORAL | 3 refills | Status: DC

## 2019-12-31 NOTE — Progress Notes (Signed)
Patient was here for nurse BP check. Blood pressure had improved compared to yesterday, but is still high. Cost will likely be an issue with Bidil and she is also having a headache. Will change to amlodipine 5 mg daily. ( listed twice on medication list; however, not currently taking). Will arrange for office visit with me next week to reevaluate her hypertension.  Current Meds  Medication Sig  . amLODipine (NORVASC) 5 MG tablet Take 5 mg by mouth daily.     Current Outpatient Medications:  .  amLODipine (NORVASC) 5 MG tablet, Take 5 mg by mouth daily., Disp: , Rfl:  .  acetaminophen (TYLENOL) 500 MG tablet, Take 500 mg by mouth every 6 (six) hours as needed for mild pain, moderate pain, fever or headache. , Disp: , Rfl:  .  albuterol (PROVENTIL HFA;VENTOLIN HFA) 108 (90 Base) MCG/ACT inhaler, Inhale 2 puffs into the lungs every 4 (four) hours as needed for wheezing or shortness of breath. , Disp: , Rfl:  .  amLODipine (NORVASC) 5 MG tablet, Take 1 tablet (5 mg total) by mouth daily., Disp: 180 tablet, Rfl: 3 .  cholecalciferol (VITAMIN D) 1000 units tablet, Take 2,000 Units by mouth daily. , Disp: , Rfl:  .  Colchicine 0.6 MG CAPS, Take 2 capsules by mouth as directed., Disp: , Rfl:  .  furosemide (LASIX) 20 MG tablet, Take 1 tablet (20 mg total) by mouth daily., Disp: 20 tablet, Rfl: 1 .  metoprolol succinate (TOPROL XL) 50 MG 24 hr tablet, Take 1 tablet (50 mg total) by mouth daily. Take with or immediately following a meal., Disp: 30 tablet, Rfl: 3 .  nitroGLYCERIN (NITROSTAT) 0.4 MG SL tablet, Place 1 tablet (0.4 mg total) under the tongue every 5 (five) minutes as needed for chest pain., Disp: 30 tablet, Rfl: 3 .  pantoprazole (PROTONIX) 40 MG tablet, Take 40 mg by mouth daily. , Disp: , Rfl:  .  potassium chloride (KLOR-CON) 8 MEQ tablet, Take 8 mEq by mouth daily. , Disp: , Rfl:  .  rivaroxaban (XARELTO) 20 MG TABS tablet, Take 1 tablet (20 mg total) by mouth daily with supper., Disp: 90  tablet, Rfl: 3

## 2020-01-05 ENCOUNTER — Encounter: Payer: Self-pay | Admitting: Cardiology

## 2020-01-05 ENCOUNTER — Other Ambulatory Visit: Payer: Self-pay

## 2020-01-05 ENCOUNTER — Ambulatory Visit (INDEPENDENT_AMBULATORY_CARE_PROVIDER_SITE_OTHER): Payer: Medicare Other | Admitting: Cardiology

## 2020-01-05 VITALS — BP 147/95 | HR 60 | Temp 97.2°F | Ht 66.0 in | Wt 215.9 lb

## 2020-01-05 DIAGNOSIS — R600 Localized edema: Secondary | ICD-10-CM

## 2020-01-05 DIAGNOSIS — I10 Essential (primary) hypertension: Secondary | ICD-10-CM

## 2020-01-05 DIAGNOSIS — I480 Paroxysmal atrial fibrillation: Secondary | ICD-10-CM

## 2020-01-05 MED ORDER — OLMESARTAN MEDOXOMIL 20 MG PO TABS: 20.0000 mg | ORAL_TABLET | Freq: Every day | ORAL | 1 refills | Status: DC

## 2020-01-05 NOTE — Progress Notes (Signed)
Follow up visit  Subjective:   Phyllis Knight, female    DOB: 04-25-1941, 79 y.o.   MRN: [id number]   Chief Complaint  Patient presents with  . Hypertension  . Follow-up    3wk    79 y.o.African Americanfemalewith hypertension, fibromyalgia, hospital admission for Afib RVR in 01/2019 in the setting of influenza illness.  Recently seen for follow up, blood pressure was markedly elevated. She was given Bidil, but developed headache. She has since been changed to amlodipine and now presents for follow up.  She is doing well. No complaints today.    Current Outpatient Medications on File Prior to Visit  Medication Sig Dispense Refill  . acetaminophen (TYLENOL) 500 MG tablet Take 500 mg by mouth every 6 (six) hours as needed for mild pain, moderate pain, fever or headache.     . albuterol (PROVENTIL HFA;VENTOLIN HFA) 108 (90 Base) MCG/ACT inhaler Inhale 2 puffs into the lungs every 4 (four) hours as needed for wheezing or shortness of breath.     Marland Kitchen amLODipine (NORVASC) 5 MG tablet Take 5 mg by mouth daily.    . cholecalciferol (VITAMIN D) 1000 units tablet Take 2,000 Units by mouth daily.     . Colchicine 0.6 MG CAPS Take 2 capsules by mouth as directed.    . furosemide (LASIX) 20 MG tablet Take 1 tablet (20 mg total) by mouth daily. 20 tablet 1  . metoprolol succinate (TOPROL XL) 50 MG 24 hr tablet Take 1 tablet (50 mg total) by mouth daily. Take with or immediately following a meal. 30 tablet 3  . nitroGLYCERIN (NITROSTAT) 0.4 MG SL tablet Place 1 tablet (0.4 mg total) under the tongue every 5 (five) minutes as needed for chest pain. 30 tablet 3  . pantoprazole (PROTONIX) 40 MG tablet Take 40 mg by mouth daily.     . potassium chloride (KLOR-CON) 8 MEQ tablet Take 8 mEq by mouth daily.     . rivaroxaban (XARELTO) 20 MG TABS tablet Take 1 tablet (20 mg total) by mouth daily with supper. 90 tablet 3   No current facility-administered medications on file prior to visit.     Cardiovascular studies:  EKG 12/15/2019: Sinus rhythm 70 bpm. Frequent PAC and PVC  Lexiscan Myoview stress test 06/07/2019: Lexiscan stress test was performed. Stress EKG is non-diagnostic, as this is pharmacological stress test. In addition, Rest and stress EKG reveal sinus rhythm, frequent PVC's and occasional PAC's.  SPECT images revealed small sized, mildly reversible, mild intensity perfusion defect in basal inferior myocardium. While breast attenuation is possible (imaging performed in sitting position), small area of ischemia cannot be excluded. LVEF 73% with normal wall motion. Low risk study.   Event monitor 04/28/2019 - 05/11/2019:  1 auto triggered event shows sinus rhythm. No patient triggered events seen. No Afib.    EKG 04/28/2019: Sinus rhythm 74 bpm.  Early R wave transition. Otherwise normal EKG.  Echocardiogram 02/16/2019:  1. The left ventricle has normal systolic function with an ejection fraction of 60-65%. The cavity size was normal. There is mild concentric left ventricular hypertrophy. Diastolic function could not be assessed due to atrial fibrillation.  2. Low normal RV systolic function.  3. Mild left atrial dilatation. Moderate right atrial dilatation.  4. Mild to moderate mitral and tricuspid regurgitation. RVSP 29 mmHg.  5. Vavlular regurgitation and chamber dilatation new since previous echocardiogram on 07/17/2018.  Recent labs: 02/15/2019: Glucose 211, BUN/Cr 12/0.66. EGFR >60. Na/K 135/3.7. Rest of the CMP  normal H/H 12/37. MCV 92. Platelets 216 TSH 0.5normal    Review of Systems  Cardiovascular: Negative for chest pain, dyspnea on exertion, leg swelling, palpitations and syncope.       Vitals:   01/05/20 1058 01/05/20 1105  BP: (!) 182/96 (!) 147/95  Pulse: 60 60  Temp: (!) 97.2 F (36.2 C)   SpO2: 98% 99%     Objective:   Physical Exam  Constitutional: She appears well-developed and well-nourished.  Neck: No JVD present.   Cardiovascular: Normal rate, regular rhythm, normal heart sounds and intact distal pulses.  No murmur heard. Trace leg edema  Pulmonary/Chest: Effort normal and breath sounds normal. She has no wheezes. She has no rales.  Musculoskeletal:        General: No edema.  Nursing note and vitals reviewed.      Assessment & Recommendations:   Assessment & Recommendations:  79 y.o.African Americanfemalewith hypertension, fibromyalgia, hospital admission for Afib RVR in 01/2019 in the setting of influenza illness  Accelerated hypertension: Blood pressure again initially elevated in our office, but on recheck had significantly improved and has also improved at home. Still not at goal. Tolerating Amlodipine well. Will add Olmesartan 20 mg daily. Check BMP in 10 days for follow up. Will consider renal artery duplex depending upon lab results with addition of Olmesartan and if BP continues to be elevated.   Paroxysmal Afib:  Has not had any known recurrence of A fib. No A fib seen on event monitor.  CHA2DS2VASc score 4. Annual stroke risk 5%. Continue Xarelto 20 mg daily.  Leg edema: Lasix 20 mg daily. Minimal leg edema noted today.  Will see her back in 4 weeks for follow up on HTN   Haynes , MSN, APRN, FNP-C [facility] [place] Office: [phone] Fax: [phone]  

## 2020-01-06 ENCOUNTER — Encounter: Payer: Self-pay | Admitting: Cardiology

## 2020-01-13 LAB — BASIC METABOLIC PANEL
BUN/Creatinine Ratio: 20 (ref 12–28)
BUN: 13 mg/dL (ref 8–27)
CO2: 23 mmol/L (ref 20–29)
Calcium: 9.4 mg/dL (ref 8.7–10.3)
Chloride: 104 mmol/L (ref 96–106)
Creatinine, Ser: 0.66 mg/dL (ref 0.57–1.00)
GFR calc Af Amer: 97 mL/min/{1.73_m2} (ref >59)
GFR calc non Af Amer: 84 mL/min/{1.73_m2} (ref >59)
Glucose: 81 mg/dL (ref 65–99)
Potassium: 4.5 mmol/L (ref 3.5–5.2)
Sodium: 142 mmol/L (ref 134–144)

## 2020-01-21 ENCOUNTER — Ambulatory Visit: Payer: Medicare Other

## 2020-01-25 ENCOUNTER — Ambulatory Visit: Payer: Medicare Other | Admitting: Cardiology

## 2020-02-02 ENCOUNTER — Other Ambulatory Visit: Payer: Self-pay

## 2020-02-02 ENCOUNTER — Ambulatory Visit: Payer: Medicare Other | Admitting: Cardiology

## 2020-02-02 ENCOUNTER — Encounter: Payer: Self-pay | Admitting: Cardiology

## 2020-02-02 VITALS — BP 146/89 | HR 50 | Temp 97.9°F | Ht 66.0 in | Wt 218.0 lb

## 2020-02-02 DIAGNOSIS — I10 Essential (primary) hypertension: Secondary | ICD-10-CM

## 2020-02-02 DIAGNOSIS — R42 Dizziness and giddiness: Secondary | ICD-10-CM

## 2020-02-02 DIAGNOSIS — I480 Paroxysmal atrial fibrillation: Secondary | ICD-10-CM

## 2020-02-02 NOTE — Progress Notes (Signed)
Follow up visit  Subjective:   Phyllis Knight, female    DOB: 06-02-1941, 79 y.o.   MRN: [id number]   Chief Complaint  Patient presents with  . Hypertension  . Follow-up    79 y.o.African Americanfemalewith hypertension, fibromyalgia, hospital admission for Afib RVR in 01/2019 in the setting of influenza illness.  Recently seen for hypertension follow up. Due to persistent hypertension, was started on Olmesartan HCT. She now presents for follow up.   She is doing well, has noticed some dizziness since being olmesartan. Blood pressure has been well controlled at home. She has not taken her medication prior to her appointment today.   Current Outpatient Medications on File Prior to Visit  Medication Sig Dispense Refill  . acetaminophen (TYLENOL) 500 MG tablet Take 500 mg by mouth every 6 (six) hours as needed for mild pain, moderate pain, fever or headache.     . albuterol (PROVENTIL HFA;VENTOLIN HFA) 108 (90 Base) MCG/ACT inhaler Inhale 2 puffs into the lungs every 4 (four) hours as needed for wheezing or shortness of breath.     Marland Kitchen amLODipine (NORVASC) 5 MG tablet Take 5 mg by mouth daily.    . cholecalciferol (VITAMIN D) 1000 units tablet Take 2,000 Units by mouth daily.     . furosemide (LASIX) 20 MG tablet Take 1 tablet (20 mg total) by mouth daily. 20 tablet 1  . metoprolol succinate (TOPROL XL) 50 MG 24 hr tablet Take 1 tablet (50 mg total) by mouth daily. Take with or immediately following a meal. 30 tablet 3  . nitroGLYCERIN (NITROSTAT) 0.4 MG SL tablet Place 1 tablet (0.4 mg total) under the tongue every 5 (five) minutes as needed for chest pain. 30 tablet 3  . olmesartan (BENICAR) 20 MG tablet Take 1 tablet (20 mg total) by mouth daily. 30 tablet 1  . pantoprazole (PROTONIX) 40 MG tablet Take 40 mg by mouth daily.     . potassium chloride (KLOR-CON) 8 MEQ tablet Take 8 mEq by mouth daily.     . rivaroxaban (XARELTO) 20 MG TABS tablet Take 1 tablet (20 mg total) by  mouth daily with supper. 90 tablet 3  . Colchicine 0.6 MG CAPS Take 2 capsules by mouth as directed.     No current facility-administered medications on file prior to visit.    Cardiovascular studies:  EKG 12/15/2019: Sinus rhythm 70 bpm. Frequent PAC and PVC  Lexiscan Myoview stress test 06/07/2019: Lexiscan stress test was performed. Stress EKG is non-diagnostic, as this is pharmacological stress test. In addition, Rest and stress EKG reveal sinus rhythm, frequent PVC's and occasional PAC's.  SPECT images revealed small sized, mildly reversible, mild intensity perfusion defect in basal inferior myocardium. While breast attenuation is possible (imaging performed in sitting position), small area of ischemia cannot be excluded. LVEF 73% with normal wall motion. Low risk study.   Event monitor 04/28/2019 - 05/11/2019:  1 auto triggered event shows sinus rhythm. No patient triggered events seen. No Afib.    EKG 04/28/2019: Sinus rhythm 74 bpm.  Early R wave transition. Otherwise normal EKG.  Echocardiogram 02/16/2019:  1. The left ventricle has normal systolic function with an ejection fraction of 60-65%. The cavity size was normal. There is mild concentric left ventricular hypertrophy. Diastolic function could not be assessed due to atrial fibrillation.  2. Low normal RV systolic function.  3. Mild left atrial dilatation. Moderate right atrial dilatation.  4. Mild to moderate mitral and tricuspid regurgitation. RVSP 29 mmHg.  5. Vavlular regurgitation and chamber dilatation new since previous echocardiogram on 07/17/2018.  Recent labs: 02/15/2019: Glucose 211, BUN/Cr 12/0.66. EGFR >60. Na/K 135/3.7. Rest of the CMP normal H/H 12/37. MCV 92. Platelets 216 TSH 0.5normal    Review of Systems  Cardiovascular: Negative for chest pain, dyspnea on exertion, leg swelling, palpitations and syncope.       Vitals:   02/02/20 1121 02/02/20 1143  BP: (!) 160/83 (!) 146/89  Pulse: (!)  57 (!) 50  Temp: 97.9 F (36.6 C)   SpO2: 98%      Objective:   Physical Exam  Constitutional: She appears well-developed and well-nourished.  Neck: No JVD present.  Cardiovascular: Normal rate, regular rhythm, normal heart sounds and intact distal pulses.  No murmur heard. Trace leg edema  Pulmonary/Chest: Effort normal and breath sounds normal. She has no wheezes. She has no rales.  Musculoskeletal:        General: No edema.  Nursing note and vitals reviewed.      Assessment & Recommendations:   Assessment & Recommendations:  79 y.o.African Americanfemalewith hypertension, fibromyalgia, hospital admission for Afib RVR in 01/2019 in the setting of influenza illness  Accelerated hypertension: Blood pressure has improved with addition of Olmesartan at home, elevated in our office today, likely related to not taking her medication today. Will bring her home blood pressure readings to her next appt. Kidney function has been stable.  Dizziness: Noted since being on Olmesartan. Will have her decrease her dose to see if this is related to the medication. She will notify us  Paroxysmal Afib:  Has not had any known recurrence of A fib. No A fib seen on event monitor.  CHA2DS2VASc score 4. Annual stroke risk 5%. Continue Xarelto 20 mg daily.   Will see her back in 6 weeks for follow up on HTN  Miquel Dunn, MSN, APRN, [facility] [place] Office: [phone] Fax: [phone]

## 2020-02-05 ENCOUNTER — Other Ambulatory Visit: Payer: Self-pay | Admitting: Cardiology

## 2020-02-05 DIAGNOSIS — R600 Localized edema: Secondary | ICD-10-CM

## 2020-03-10 ENCOUNTER — Other Ambulatory Visit: Payer: Self-pay

## 2020-03-10 MED ORDER — OLMESARTAN MEDOXOMIL 20 MG PO TABS: 20.0000 mg | ORAL_TABLET | Freq: Every day | ORAL | 1 refills | Status: DC

## 2020-03-16 ENCOUNTER — Ambulatory Visit: Payer: Medicare Other | Admitting: Cardiology

## 2020-03-17 ENCOUNTER — Ambulatory Visit: Payer: Medicare Other | Admitting: Cardiology

## 2020-03-31 NOTE — Progress Notes (Signed)
Follow up visit  Subjective:   Phyllis Knight, female    DOB: [birth year], 79 y.o.   MRN: [id number]    HPI   Chief Complaint  Patient presents with  . Hypertension  . Follow-up    79 y.o.African Americanfemalewith hypertension, fibromyalgia, paroxysmal Afib  She is doing well. She has occasional sharp chest pain at rest, but doe not occur with walking. She has undergone stress test in the past, details below. She is tolerating antihypertensive medications well, barring occasional lightheadedness. She is hoping to undergo knee surgery in the near future.   Current Outpatient Medications on File Prior to Visit  Medication Sig Dispense Refill  . acetaminophen (TYLENOL) 500 MG tablet Take 500 mg by mouth every 6 (six) hours as needed for mild pain, moderate pain, fever or headache.     . albuterol (PROVENTIL HFA;VENTOLIN HFA) 108 (90 Base) MCG/ACT inhaler Inhale 2 puffs into the lungs every 4 (four) hours as needed for wheezing or shortness of breath.     Marland Kitchen amLODipine (NORVASC) 5 MG tablet Take 5 mg by mouth daily.    . cetirizine (ZYRTEC) 10 MG tablet Take 10 mg by mouth daily.    . cholecalciferol (VITAMIN D) 1000 units tablet Take 2,000 Units by mouth daily.     . furosemide (LASIX) 20 MG tablet TAKE 1 TABLET BY MOUTH  DAILY 40 tablet 8  . metoprolol succinate (TOPROL XL) 50 MG 24 hr tablet Take 1 tablet (50 mg total) by mouth daily. Take with or immediately following a meal. 30 tablet 3  . nitroGLYCERIN (NITROSTAT) 0.4 MG SL tablet Place 1 tablet (0.4 mg total) under the tongue every 5 (five) minutes as needed for chest pain. 30 tablet 3  . olmesartan (BENICAR) 20 MG tablet Take 1 tablet (20 mg total) by mouth daily. 30 tablet 1  . pantoprazole (PROTONIX) 40 MG tablet Take 40 mg by mouth daily.     . potassium chloride (KLOR-CON) 8 MEQ tablet Take 8 mEq by mouth daily.     . rivaroxaban (XARELTO) 20 MG TABS tablet Take 1 tablet (20 mg total) by mouth daily with supper. 90  tablet 3  . Colchicine 0.6 MG CAPS Take 2 capsules by mouth as directed.     No current facility-administered medications on file prior to visit.    Cardiovascular & other pertient studies:  Lexiscan Myoview stress test 06/07/2019: Lexiscan stress test was performed. Stress EKG is non-diagnostic, as this is pharmacological stress test. In addition, Rest and stress EKG reveal sinus rhythm, frequent PVC's and occasional PAC's.  SPECT images revealed small sized, mildly reversible, mild intensity perfusion defect in basal inferior myocardium. While breast attenuation is possible (imaging performed in sitting position), small area of ischemia cannot be excluded. LVEF 73% with normal wall motion. Low risk study.   Event monitor 04/28/2019 - 05/11/2019:  1 auto triggered event shows sinus rhythm. No patient triggered events seen. No Afib.    Echocardiogram 02/16/2019:  1. The left ventricle has normal systolic function with an ejection fraction of 60-65%. The cavity size was normal. There is mild concentric left ventricular hypertrophy. Diastolic function could not be assessed due to atrial fibrillation.  2. Low normal RV systolic function.  3. Mild left atrial dilatation. Moderate right atrial dilatation.  4. Mild to moderate mitral and tricuspid regurgitation. RVSP 29 mmHg.  5. Vavlular regurgitation and chamber dilatation new since previous echocardiogram on 07/17/2018.   Recent labs: 01/12/2020: Glucose 81, BUN/Cr 13/0.66.  EGFR 97. Na/K 142/4.5. Rest of the CMP normal Chol 207, TG 119, HDL 47, LDL 139.  02/15/2019: Glucose 211, BUN/Cr 12/0.66. EGFR >60. Na/K 135/3.7. Rest of the CMP normal H/H 12/37. MCV 92. Platelets 216 TSH 0.5 normal.    Review of Systems  Cardiovascular: Positive for chest pain (Occasional). Negative for dyspnea on exertion, leg swelling, palpitations and syncope.  Musculoskeletal: Positive for joint pain.         Vitals:   04/13/20 0946 04/13/20 0947    BP: 134/86 (!) 144/64  Pulse: (!) 53 (!) 52  Resp: 17   Temp: 98 F (36.7 C)   SpO2: 99%      Body mass index is 34.7 kg/m. Filed Weights   04/13/20 0946  Weight: 215 lb (97.5 kg)     Objective:   Physical Exam  Constitutional: No distress.  Neck: No JVD present.  Cardiovascular: Normal rate, regular rhythm, normal heart sounds and intact distal pulses.  No murmur heard. Pulmonary/Chest: Effort normal and breath sounds normal. She has no wheezes. She has no rales.  Musculoskeletal:        General: No edema.  Nursing note and vitals reviewed.     Assessment & Recommendations:   79 y.o.African Americanfemalewith hypertension, fibromyalgia, paroxysmal Afib  Hypertension: Controlled. If dizziness persists, could decrease/omit olmesartan  Paroxysmal Afib:  Has not had any known recurrence of A fib. No A fib seen on event monitor.  CHA2DS2VASc score 4. Annual stroke risk 5%. Continue Xarelto 20 mg daily.  Hyperlipidemia: Started Crestor 10 mg daily.  Pre-op risk stratification: Low risk stress test. Acceptable cardiac risk for knee surgery. Xarelto can be held for 2 days prior to the surgery.  [name], MD [facility] [place] Pager: [phone] Office: [phone] If no answer Cell [phone] .

## 2020-04-13 ENCOUNTER — Encounter: Payer: Self-pay | Admitting: Cardiology

## 2020-04-13 ENCOUNTER — Ambulatory Visit: Payer: Medicare Other | Admitting: Cardiology

## 2020-04-13 ENCOUNTER — Other Ambulatory Visit: Payer: Self-pay

## 2020-04-13 VITALS — BP 144/64 | HR 52 | Temp 98.0°F | Resp 17 | Ht 66.0 in | Wt 215.0 lb

## 2020-04-13 DIAGNOSIS — I480 Paroxysmal atrial fibrillation: Secondary | ICD-10-CM

## 2020-04-13 DIAGNOSIS — E782 Mixed hyperlipidemia: Secondary | ICD-10-CM | POA: Insufficient documentation

## 2020-04-13 DIAGNOSIS — R42 Dizziness and giddiness: Secondary | ICD-10-CM

## 2020-04-13 DIAGNOSIS — I10 Essential (primary) hypertension: Secondary | ICD-10-CM

## 2020-04-13 MED ORDER — ROSUVASTATIN CALCIUM 10 MG PO TABS: 10.0000 mg | ORAL_TABLET | Freq: Every day | ORAL | 3 refills | Status: DC

## 2020-04-18 ENCOUNTER — Other Ambulatory Visit: Payer: Self-pay | Admitting: Cardiology

## 2020-05-10 ENCOUNTER — Telehealth: Payer: Self-pay | Admitting: Pharmacist

## 2020-05-10 NOTE — Telephone Encounter (Signed)
Called pt regarding overnight BP readings of 94/53 and 96/61. BP ranging from 151-94/93-55. Pt reports to having episodes of light headedness and dizziness during this period. Pt denies any syncope or falls. Reports symptoms resolved after resting and increased fluid intake. BP today morning of 132/86. BP while on the phone of 121/87. Med list reviewed and updated. Current antihypertensive meds include amlodipine 5 mg, metoprolol 50 mg, olmesartan 10 mg, and lasix 20 mg daily. Pt takes all of them in the morning. Pt previously reported feeling dizzy since starting olmesartan. Olmesartan dose reduce from 20 mg to 10 mg during last OV on 04/13/20. Recommended pt try taking her amlodipine at bedtime to help space out her antihypertensive medications. Pt still having knee pain and is waiting to follow up with her orthopedist about possible knee surgery. Will continue to closely monitor and follow up as needed.

## 2020-06-02 ENCOUNTER — Ambulatory Visit
Admission: RE | Admit: 2020-06-02 | Discharge: 2020-06-02 | Disposition: A | Discharge status: Home / Self Care | Payer: Medicare Other | Source: Ambulatory Visit | Attending: Family Medicine | Admitting: Family Medicine

## 2020-06-02 ENCOUNTER — Other Ambulatory Visit: Payer: Self-pay

## 2020-06-02 ENCOUNTER — Other Ambulatory Visit: Payer: Self-pay | Admitting: Family Medicine

## 2020-06-02 DIAGNOSIS — M25512 Pain in left shoulder: Secondary | ICD-10-CM

## 2020-06-20 ENCOUNTER — Other Ambulatory Visit: Payer: Self-pay | Admitting: Pharmacist

## 2020-06-20 DIAGNOSIS — I480 Paroxysmal atrial fibrillation: Secondary | ICD-10-CM

## 2020-06-20 DIAGNOSIS — R600 Localized edema: Secondary | ICD-10-CM

## 2020-06-20 MED ORDER — FUROSEMIDE 20 MG PO TABS: 20.0000 mg | ORAL_TABLET | Freq: Every day | ORAL | 2 refills | Status: DC

## 2020-06-20 MED ORDER — WARFARIN SODIUM 5 MG PO TABS: 5.0000 mg | ORAL_TABLET | Freq: Every day | ORAL | 3 refills | Status: DC

## 2020-06-20 NOTE — Progress Notes (Signed)
Pt called stating that she was not able to afford her Xeralto through [facility]. Per [facility] she last filled Xeralto for 90 d/s on 05/11/20. Pt currently has a balance of ~$200 in her [facility] account and is unable to get any future fills from [facility] till she has addressed her balance. Per [facility] pharmacist, copay for 3 month supply of Xeralto was $131. Pt currently on fixed income and isnt able to address the account balance till she gets her next check statement next week.   Will be switching anticoagulation therapy from Xeralto to warfarin. Rx for Warfarin 5 mg daily pended for Dr. Rosemary Holms. Pt stated that she wont be able to pick up the warfarin from the pharmacy till next week. Pt to call the office once she is able to pick up the Rx to schedule INR check and review Xeralto to warfarin transition. Will have pt bridge with [facility] for 3 days before discontinuing Xeralto from pt's Med list. Will schedule INR check once pt confirms that she was able to pick up the warfarin. Pt still has 1 month supply of Xeralto that she plans on continuing till the warfarin bridge is initiated.

## 2020-06-28 NOTE — Progress Notes (Signed)
Pt called stating that she picked up her warfarin Rx today. Pt to start warfarin today and bridge with [facility] for 3 days. INR check scheduled for 06/30/30.

## 2020-06-30 ENCOUNTER — Other Ambulatory Visit: Payer: Self-pay

## 2020-06-30 ENCOUNTER — Ambulatory Visit: Payer: Medicare Other | Admitting: Pharmacist

## 2020-06-30 DIAGNOSIS — Z7901 Long term (current) use of anticoagulants: Secondary | ICD-10-CM | POA: Insufficient documentation

## 2020-06-30 DIAGNOSIS — I480 Paroxysmal atrial fibrillation: Secondary | ICD-10-CM

## 2020-06-30 DIAGNOSIS — Z5181 Encounter for therapeutic drug level monitoring: Secondary | ICD-10-CM

## 2020-06-30 LAB — POCT INR: INR: 1.2 — AB (ref 2.0–3.0)

## 2020-06-30 NOTE — Patient Instructions (Addendum)
INSTRUCTIONS: INR below goal. Take 10 mg today and then continue 5 mg daily. Recheck INR in 3 days.   Warfarin tablets What is this medicine? WARFARIN (WAR far in) is an anticoagulant. It is used to treat or prevent clots in the veins, arteries, lungs, or heart. This medicine may be used for other purposes; ask your health care provider or pharmacist if you have questions. COMMON BRAND NAME(S): Coumadin, Jantoven What should I tell my health care provider before I take this medicine? They need to know if you have any of these conditions:  alcoholism  anemia  bleeding disorders  cancer  diabetes  heart disease  high blood pressure  history of bleeding in the gastrointestinal tract  history of stroke or other brain injury or disease  kidney or liver disease  protein C deficiency  protein S deficiency  psychosis or dementia  recent injury, recent or planned surgery or procedure  an unusual or allergic reaction to warfarin, other medicines, foods, dyes, or preservatives  pregnant or trying to get pregnant  breast-feeding How should I use this medicine? Take this medicine by mouth with a glass of water. Follow the directions on the prescription label. You can take this medicine with or without food. Take your medicine at the same time each day. Do not take it more often than directed. Do not stop taking except on your doctor's advice. Stopping this medicine may increase your risk of a blood clot. Be sure to refill your prescription before you run out of medicine. If your doctor or healthcare professional calls to change your dose, write down the dose and any other instructions. Always read the dose and instructions back to him or her to make sure you understand them. Tell your doctor or healthcare professional what strength of tablets you have on hand. Ask how many tablets you should take to equal your new dose. Write the date on the new instructions and keep them near your  medicine. If you are told to stop taking your medicine until your next blood test, call your doctor or healthcare professional if you do not hear anything within 24 hours of the test to find out your new dose or when to restart your prior dose. A special MedGuide will be given to you by the pharmacist with each prescription and refill. Be sure to read this information carefully each time. Talk to your pediatrician regarding the use of this medicine in children. Special care may be needed. Overdosage: If you think you have taken too much of this medicine contact a poison control center or emergency room at once. NOTE: This medicine is only for you. Do not share this medicine with others. What if I miss a dose? It is important not to miss a dose. If you miss a dose, call your healthcare provider. Take the dose as soon as possible on the same day. If it is almost time for your next dose, take only that dose. Do not take double or extra doses to make up for a missed dose. What may interact with this medicine? Do not take this medicine with any of the following medications:  agents that prevent or dissolve blood clots  aspirin or other salicylates  danshen  dextrothyroxine  mifepristone  St. [name]'s Wort  red yeast rice This medicine may also interact with the following medications:  acetaminophen  agents that lower cholesterol  alcohol  allopurinol  amiodarone  antibiotics or medicines for treating bacterial, fungal or viral infections  azathioprine  barbiturate medicines for inducing sleep or treating seizures  certain medicines for diabetes  certain medicines for heart rhythm problems  certain medicines for hepatitis C virus infections like daclatasvir, dasabuvir; ombitasvir; paritaprevir; ritonavir, elbasvir; grazoprevir, ledipasvir; sofosbuvir, simeprevir, sofosbuvir, sofosbuvir; velpatasvir, sofosbuvir; velpatasvir; voxilaprevir  certain medicines for high blood  pressure  chloral hydrate  cisapride  conivaptan  disulfiram  female hormones, including contraceptive or birth control pills  general anesthetics  herbal or dietary products like garlic, ginkgo, ginseng, green tea, or kava kava  influenza virus vaccine  female hormones  medicines for mental depression or psychosis  medicines for some types of cancer  medicines for stomach problems  methylphenidate  NSAIDs, medicines for pain and inflammation, like ibuprofen or naproxen  propoxyphene  quinidine, quinine  raloxifene  seizure or epilepsy medicine like carbamazepine, phenytoin, and valproic acid  steroids like cortisone and prednisone  tamoxifen  thyroid medicine  tramadol  vitamin c, vitamin e, and vitamin K  zafirlukast  zileuton This list may not describe all possible interactions. Give your health care provider a list of all the medicines, herbs, non-prescription drugs, or dietary supplements you use. Also tell them if you smoke, drink alcohol, or use illegal drugs. Some items may interact with your medicine. What should I watch for while using this medicine? Visit your healthcare professional for regular checks on your progress. You will need to have a blood test called a PT/INR regularly. The PT/INR blood test is done to make sure you are getting the right dose of this medicine. It is important to not miss your appointment for the blood tests. When you first start taking this medicine, these tests are done often. Once the correct dose is determined and you take your medicine properly, these tests can be done less often. Wear a medical ID bracelet or chain, and carry a card that describes your disease and details of your medicine and dosage times. Do not start taking or stop taking any medicines or over-the-counter medicines except on the advice of your healthcare professional. You should discuss your diet with your healthcare professional. Do not make major  changes in your diet. Vitamin K can affect how well this medicine works. Many foods contain vitamin K. It is important to eat a consistent amount of foods with vitamin K. Other foods with vitamin K that you should eat in consistent amounts are asparagus, basil, black-eyed peas, broccoli, brussel sprouts, cabbage, green onions, green tea, parsley, green leafy vegetables like beet greens, collard greens, kale, spinach, turnip greens, or certain lettuces like green leaf or romaine. This medicine can cause birth defects or bleeding in an unborn child. Women of childbearing age should use effective birth control while taking this medicine. If a woman becomes pregnant while taking this medicine, she should discuss the potential risks and her options with her healthcare professional. Avoid sports and activities that might cause injury while you are using this medicine. Severe falls or injuries can cause unseen bleeding. Be careful when using sharp tools or knives. Consider using an Neurosurgeon. Take special care brushing or flossing your teeth. Report any injuries, bruising, or red spots on the skin to your healthcare professional. If you have an illness that causes vomiting, diarrhea, or fever for more than a few days, contact your health care professional. Also, check with your healthcare professional if you are unable to eat for several days. These problems can change the effect of this medicine. Even after you stop taking this  medicine, it takes several days before your body recovers its normal ability to clot blood. Ask your healthcare professional how long you need to be careful. If you are going to have surgery or dental work, tell your health care professional that you have been taking this medicine. What side effects may I notice from receiving this medicine? Side effects that you should report to your doctor or health care professional as soon as possible:  allergic reactions like skin rash, itching or  hives, swelling of the face, lips, or tongue  heavy menstrual bleeding or vaginal bleeding  painful, blue or purple toes  painful skin ulcers that do not go away  signs and symptoms of bleeding such as bloody or black, tarry stools; red or dark-brown urine; spitting up blood or brown material that looks like coffee grounds; red spots on the skin; unusual bruising or bleeding from the eye, gums, or nose  signs and symptoms of a blood clot such as chest pain; shortness of breath; pain, swelling, or warmth in the leg  signs and symptoms of a stroke such as changes in vision; confusion; trouble speaking or understanding; severe headaches; sudden numbness or weakness of the face, arm or leg; trouble walking; dizziness; loss of coordination  stomach pain  unusually weak or tired Side effects that usually do not require medical attention (report to your doctor or health care professional if they continue or are bothersome):  diarrhea  hair loss This list may not describe all possible side effects. Call your doctor for medical advice about side effects. You may report side effects to FDA at 1-800-FDA-1088. Where should I keep my medicine? Keep out of the reach of children. Store at room temperature between 15 and 30 degrees C (59 and 86 degrees F). Protect from light. Throw away any unused medicine after the expiration date. Do not flush down the toilet. NOTE: This sheet is a summary. It may not cover all possible information. If you have questions about this medicine, talk to your doctor, pharmacist, or health care provider.  2020 Elsevier/Gold Standard (2017-09-03 13:06:45)  Vitamin K Foods and Warfarin Warfarin is a blood thinner (anticoagulant). Anticoagulant medicines help prevent the formation of blood clots. These medicines work by decreasing the activity of vitamin K, which promotes normal blood clotting. When you take warfarin, problems can occur from suddenly increasing or  decreasing the amount of vitamin K that you eat from one day to the next. Problems may include:  Blood clots.  Bleeding. What general guidelines do I need to follow? To avoid problems when taking warfarin:  Eat a balanced diet that includes: ? Fresh fruits and vegetables. ? Whole grains. ? Low-fat dairy products. ? Lean proteins, such as fish, eggs, and lean cuts of meat.  Keep your intake of vitamin K consistent from day to day. To do this: ? Avoid eating large amounts of vitamin K one day and low amounts of vitamin K the next day. ? If you take a multivitamin that contains vitamin K, be sure to take it every day. ? Know which foods contain vitamin K. Use the lists below to understand serving sizes and the amount of vitamin K in one serving.  Avoid major changes in your diet. If you are going to change your diet, talk with your health care provider before making changes.  Work with a Dealer (dietitian) to develop a meal plan that works best for you.  High vitamin K foods Foods that are high in  vitamin K contain more than 100 mcg (micrograms) per serving. These include:  Broccoli (cooked) -  cup has 110 mcg.  Brussels sprouts (cooked) -  cup has 109 mcg.  Greens, beet (cooked) -  cup has 350 mcg.  Greens, collard (cooked) -  cup has 418 mcg.  Greens, turnip (cooked) -  cup has 265 mcg.  Green onions or scallions -  cup has 105 mcg.  Kale (fresh or frozen) -  cup has 531 mcg.  Parsley (raw) - 10 sprigs has 164 mcg.  Spinach (cooked) -  cup has 444 mcg.  Swiss chard (cooked) -  cup has 287 mcg. Moderate vitamin K foods Foods that have a moderate amount of vitamin K contain 25-100 mcg per serving. These include:  Asparagus (cooked) - 5 spears have 38 mcg.  Black-eyed peas (dried) -  cup has 32 mcg.  Cabbage (cooked) -  cup has 37 mcg.  Kiwi fruit - 1 medium has 31 mcg.  Lettuce - 1 cup has 57-63 mcg.  Okra (frozen) -  cup has 44  mcg.  Prunes (dried) - 5 prunes have 25 mcg.  Watercress (raw) - 1 cup has 85 mcg. Low vitamin K foods Foods low in vitamin K contain less than 25 mcg per serving. These include:  Artichoke - 1 medium has 18 mcg.  Avocado - 1 oz. has 6 mcg.  Blueberries -  cup has 14 mcg.  Cabbage (raw) -  cup has 21 mcg.  Carrots (cooked) -  cup has 11 mcg.  Cauliflower (raw) -  cup has 11 mcg.  Cucumber with peel (raw) -  cup has 9 mcg.  Grapes -  cup has 12 mcg.  Mango - 1 medium has 9 mcg.  Nuts - 1 oz. has 15 mcg.  Pear - 1 medium has 8 mcg.  Peas (cooked) -  cup has 19 mcg.  Pickles - 1 spear has 14 mcg.  Pumpkin seeds - 1 oz. has 13 mcg.  Sauerkraut (canned) -  cup has 16 mcg.  Soybeans (cooked) -  cup has 16 mcg.  Tomato (raw) - 1 medium has 10 mcg.  Tomato sauce -  cup has 17 mcg. Vitamin K-free foods If a food contain less than 5 mcg per serving, it is considered to have no vitamin K. These foods include:  Bread and cereal products.  Cheese.  Eggs.  Fish and shellfish.  Meat and poultry.  Milk and dairy products.  Sunflower seeds. Actual amounts of vitamin K in foods may be different depending on processing. Talk with your dietitian about what foods you can eat and what foods you should avoid. This information is not intended to replace advice given to you by your health care provider. Make sure you discuss any questions you have with your health care provider. Document Revised: 10/31/2017 Document Reviewed: 02/21/2016 Elsevier Patient Education  2020 [place].  Bleeding Precautions When on Anticoagulant Therapy, Adult Anticoagulant therapy, also called blood thinner therapy, is medicine that helps to prevent and treat blood clots. The medicine works by stopping blood clots from forming or growing. Blood clots that form in your blood vessels can be dangerous. They can break loose and travel to the heart, lungs, or brain. This increases the risk  of a heart attack, stroke, or blocked lung artery (pulmonary embolism). Anticoagulants also increase the risk of bleeding. Try to protect yourself from cuts and other injuries that can cause bleeding. It is important to take anticoagulants exactly  as told by your health care provider. Why do I need to be on anticoagulant therapy? You may need this medicine if you are at risk of developing a blood clot. Conditions that increase your risk of a blood clot include:  Being born with heart disease or a heart malformation (congenital heart disease).  Developing heart disease.  Having had surgery, such as valve replacement.  Having had a serious accident or other type of severe injury (trauma).  Having certain types of cancer.  Having certain diseases that can increase blood clotting.  Having a high risk of stroke or heart attack.  Having atrial fibrillation (AF). What are the common anticoagulant medicines? There are several types of anticoagulant medicines. The most common types are:  Medicines that you take by mouth (oral medicines), such as: ? Warfarin. ? Novel oral anticoagulants (NOACs), such as:  Direct thrombin inhibitors (dabigatran).  Factor Xa inhibitors (apixaban, edoxaban, and rivaroxaban).  Injections, such as: ? Unfractionated heparin. ? Low molecular weight heparin. These anticoagulants work in different ways to prevent blood clots. They also have different risks and side effects. What do I need to remember while on anticoagulant therapy? Taking anticoagulants  Take your medicine at the same time every day. If you forget to take your medicine, take it as soon as you remember. Do not double your dosage of medicine if you miss a whole day. Take your normal dose and call your health care provider.  Do not stop taking your medicine unless your health care provider approves. Stopping the medicine can increase your risk of developing a blood clot. Taking other  medicines  Take over-the-counter and prescriptions medicines only as told by your health care provider.  Do not take over-the-counter NSAIDs, including aspirin and ibuprofen, while you are on anticoagulant therapy. These medicines increase your risk of dangerous bleeding.  Get approval from your health care provider before you start taking any new medicines, vitamins, or herbal products. Some of these could interfere with your therapy. General instructions  Keep all follow-up visits as told by your health care provider. This is important.  If you are pregnant or trying to get pregnant, talk with a health care provider about anticoagulants. Some of these medicines are not safe to take during pregnancy.  Tell all health care providers, including your dentist, that you are on anticoagulant therapy. It is especially important to tell providers before you have any surgery, medical procedures, or dental work done. What precautions should I take?   Be very careful when using knives, scissors, or other sharp objects.  Use an electric razor instead of a blade.  Do not use toothpicks.  Use a soft-bristled toothbrush. Brush your teeth gently.  Always wear shoes outdoors and wear slippers indoors.  Be careful when cutting your fingernails and toenails.  Place bath mats in the bathroom. If possible, install handrails as well.  Wear gloves while you do yard work.  Wear your seat belt.  Prevent falls by removing loose rugs and extension cords from areas where you walk. Use a cane or walker if you need it.  Avoid constipation by: ? Drinking enough fluid to keep your urine clear or pale yellow. ? Eating foods that are high in fiber, such as fresh fruits and vegetables, whole grains, and beans. ? Limiting foods that are high in fat and processed sugars, such as fried and sweet foods.  Do not play contact sports or participate in other activities that have a high risk for injury.  What other  precautions are important if on warfarin therapy? If you are taking a type of anticoagulant called warfarin, make sure you:  Work with a diet and nutrition specialist (dietitian) to make an eating plan. Do not make any sudden changes to your diet after you have started your eating plan.  Do not drink alcohol. It can interfere with your medicine and increase your risk of an injury that causes bleeding.  Get regular blood tests as told by your health care provider. What are some questions to ask my health care provider?  Why do I need anticoagulant therapy?  What is the best anticoagulant therapy for my condition?  How long will I need anticoagulant therapy?  What are the side effects of anticoagulant therapy?  When should I take my medicine? What should I do if I forget to take it?  Will I need to have regular blood tests?  Do I need to change my diet? Are there foods or drinks that I should avoid?  What activities are safe for me?  What should I do if I want to get pregnant? Contact a health care provider if:  You miss a dose of medicine: ? And you are not sure what to do. ? For more than one day.  You have: ? Menstrual bleeding that is heavier than normal. ? Bloody or brown urine. ? Easy bruising. ? Black and tarry stool or bright red stool. ? Side effects from your medicine.  You feel weak or dizzy.  You become pregnant. Get help right away if:  You have bleeding that will not stop within 20 minutes from: ? The nose. ? The gums. ? A cut on the skin.  You have a severe headache or stomachache.  You vomit or cough up blood.  You fall or hit your head. Summary  Anticoagulant therapy, also called blood thinner therapy, is medicine that helps to prevent and treat blood clots.  Anticoagulants work in different ways to prevent blood clots. They also have different risks and side effects.  Talk with your health care provider about any precautions that you should  take while on anticoagulant therapy. This information is not intended to replace advice given to you by your health care provider. Make sure you discuss any questions you have with your health care provider. Document Revised: 03/10/2019 Document Reviewed: 02/04/2017 Elsevier Patient Education  [place].

## 2020-06-30 NOTE — Progress Notes (Signed)
Anticoagulation Management Phyllis Knight is a 79 y.o. female who reports to the clinic for monitoring of warfarin treatment.    Indication: atrial fibrillation CHA2DS2 Vasc Score 4 (Age >75, Female, HTN hx), HAS-BLED 1 (Age>65,   Duration: indefinite Supervising physician: Manish Patwardhan  Anticoagulation Clinic Visit History:  Patient does not report signs/symptoms of bleeding or thromboembolism   Other recent changes: No change diet, medications, lifestyle. Started warfarin  On 06/28/20.   Anticoagulation Episode Summary    Current INR goal:  2.0-3.0  TTR:  --  Next INR check:  07/03/2020  INR from last check:  1.2 (06/30/2020)  Weekly max warfarin dose:    Target end date:    INR check location:    Preferred lab:    Send INR reminders to:     Indications   Paroxysmal A-fib (HCC) [I48.0] Monitoring for long-term anticoagulant use [Z51.81 Z79.01]       Comments:        Anticoagulation Care Providers    Provider Role Specialty Phone number   Elder Negus, MD Referring Cardiology [phone]      Allergies  Allergen Reactions  . Codeine Nausea And Vomiting  . Zofran [Ondansetron Hcl] Rash    Current Outpatient Medications:  .  acetaminophen (TYLENOL) 500 MG tablet, Take 500 mg by mouth every 6 (six) hours as needed for mild pain, moderate pain, fever or headache. , Disp: , Rfl:  .  albuterol (PROVENTIL HFA;VENTOLIN HFA) 108 (90 Base) MCG/ACT inhaler, Inhale 2 puffs into the lungs every 4 (four) hours as needed for wheezing or shortness of breath. , Disp: , Rfl:  .  amLODipine (NORVASC) 5 MG tablet, Take 5 mg by mouth daily. Taking it at night, Disp: , Rfl:  .  cetirizine (ZYRTEC) 10 MG tablet, Take 10 mg by mouth daily., Disp: , Rfl:  .  cholecalciferol (VITAMIN D) 1000 units tablet, Take 2,000 Units by mouth daily. , Disp: , Rfl:  .  Colchicine 0.6 MG CAPS, Take 2 capsules by mouth as directed., Disp: , Rfl:  .  furosemide (LASIX) 20 MG tablet, Take 1  tablet (20 mg total) by mouth daily., Disp: 90 tablet, Rfl: 2 .  metoprolol succinate (TOPROL-XL) 50 MG 24 hr tablet, TAKE 1 TABLET BY MOUTH  DAILY WITH OR IMMEDIATELY  FOLLOWING A MEAL (Patient taking differently: Taking in the morning), Disp: 30 tablet, Rfl: 6 .  nitroGLYCERIN (NITROSTAT) 0.4 MG SL tablet, Place 1 tablet (0.4 mg total) under the tongue every 5 (five) minutes as needed for chest pain., Disp: 30 tablet, Rfl: 3 .  olmesartan (BENICAR) 20 MG tablet, Take 1 tablet (20 mg total) by mouth daily. (Patient taking differently: Take 10 mg by mouth daily. Taking in the morning), Disp: 30 tablet, Rfl: 1 .  pantoprazole (PROTONIX) 40 MG tablet, Take 40 mg by mouth daily. , Disp: , Rfl:  .  potassium chloride (KLOR-CON) 8 MEQ tablet, Take 8 mEq by mouth daily. , Disp: , Rfl:  .  rosuvastatin (CRESTOR) 10 MG tablet, Take 1 tablet (10 mg total) by mouth daily., Disp: 30 tablet, Rfl: 3 .  warfarin (COUMADIN) 5 MG tablet, Take 1 tablet (5 mg total) by mouth daily., Disp: 90 tablet, Rfl: 3 Past Medical History:  Diagnosis Date  . Anemia    pt denies  . Arthritis   . Atrial fibrillation (HCC)   . Bradycardia   . Bronchitis    hx of   . Chest pain 06-10-14  had chest pain thia am  . Dizziness   . Fibromyalgia   . 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 GERD (gastroesophageal reflux disease)   . H/O hiatal hernia   . Hearing loss    left ear   . History of uterine cancer 1975   s/p hysterectomy  . Hypertension   . Numbness and tingling    hands and feet bilat   . Pneumonia yrs ago  . Shortness of breath dyspnea   . Syncope 07/16/2018  . Tinnitus   . Urge urinary incontinence   . Urinary incontinence   . Vitamin D deficiency     ASSESSMENT  Recent Results: The most recent result is correlated with 35 mg per week:  Lab Results  Component Value Date   INR 1.2 (A) 06/30/2020   INR 1.12 07/21/2013    Anticoagulation Dosing: Description   INR below  goal. Take 10 mg today and then continue 5 mg daily. Recheck INR in 3 days.       INR today: Subtherapeutic. New start warfarin. Pt bridged Xeralto transition to warfarin with concomitant use for 2 days. Will boost today and continue close monitoring to ensure pt becomes therapeutic. Provided pt with Eliquis and Xeralto patient assistance eligibility check. Pt to bring in necessary documentation and will submit application for patient assistance at next INR check. Pt to document food diary and to be reivewed at next INR check.   Provided patient with verbal and written information regarding purpose, proper use, and potential adverse effects of warfarin. Included information regarding drug interactions, dietary interactions, need to avoid or limit alcohol intake, necessary INR monitoring, and reporting any unexpected bleeding. Patient described understanding.  PLAN Weekly dose was unchanged. Take 10 mg today and then continue 5 mg daily. Recheck INR in 3 days.   Patient Instructions  INSTRUCTIONS: INR below goal. Take 10 mg today and then continue 5 mg daily. Recheck INR in 3 days.   Warfarin tablets What is this medicine? WARFARIN (WAR far in) is an anticoagulant. It is used to treat or prevent clots in the veins, arteries, lungs, or heart. This medicine may be used for other purposes; ask your health care provider or pharmacist if you have questions. COMMON BRAND NAME(S): Coumadin, Jantoven What should I tell my health care provider before I take this medicine? They need to know if you have any of these conditions:  alcoholism  anemia  bleeding disorders  cancer  diabetes  heart disease  high blood pressure  history of bleeding in the gastrointestinal tract  history of stroke or other brain injury or disease  kidney or liver disease  protein C deficiency  protein S deficiency  psychosis or dementia  recent injury, recent or planned surgery or procedure  an unusual  or allergic reaction to warfarin, other medicines, foods, dyes, or preservatives  pregnant or trying to get pregnant  breast-feeding How should I use this medicine? Take this medicine by mouth with a glass of water. Follow the directions on the prescription label. You can take this medicine with or without food. Take your medicine at the same time each day. Do not take it more often than directed. Do not stop taking except on your doctor's advice. Stopping this medicine may increase your risk of a blood clot. Be sure to refill your prescription before you run out of medicine. If your doctor or healthcare professional calls to change your dose, write  down the dose and any other instructions. Always read the dose and instructions back to him or her to make sure you understand them. Tell your doctor or healthcare professional what strength of tablets you have on hand. Ask how many tablets you should take to equal your new dose. Write the date on the new instructions and keep them near your medicine. If you are told to stop taking your medicine until your next blood test, call your doctor or healthcare professional if you do not hear anything within 24 hours of the test to find out your new dose or when to restart your prior dose. A special MedGuide will be given to you by the pharmacist with each prescription and refill. Be sure to read this information carefully each time. Talk to your pediatrician regarding the use of this medicine in children. Special care may be needed. Overdosage: If you think you have taken too much of this medicine contact a poison control center or emergency room at once. NOTE: This medicine is only for you. Do not share this medicine with others. What if I miss a dose? It is important not to miss a dose. If you miss a dose, call your healthcare provider. Take the dose as soon as possible on the same day. If it is almost time for your next dose, take only that dose. Do not take  double or extra doses to make up for a missed dose. What may interact with this medicine? Do not take this medicine with any of the following medications:  agents that prevent or dissolve blood clots  aspirin or other salicylates  danshen  dextrothyroxine  mifepristone  St. [name]'s Wort  red yeast rice This medicine may also interact with the following medications:  acetaminophen  agents that lower cholesterol  alcohol  allopurinol  amiodarone  antibiotics or medicines for treating bacterial, fungal or viral infections  azathioprine  barbiturate medicines for inducing sleep or treating seizures  certain medicines for diabetes  certain medicines for heart rhythm problems  certain medicines for hepatitis C virus infections like daclatasvir, dasabuvir; ombitasvir; paritaprevir; ritonavir, elbasvir; grazoprevir, ledipasvir; sofosbuvir, simeprevir, sofosbuvir, sofosbuvir; velpatasvir, sofosbuvir; velpatasvir; voxilaprevir  certain medicines for high blood pressure  chloral hydrate  cisapride  conivaptan  disulfiram  female hormones, including contraceptive or birth control pills  general anesthetics  herbal or dietary products like garlic, ginkgo, ginseng, green tea, or kava kava  influenza virus vaccine  female hormones  medicines for mental depression or psychosis  medicines for some types of cancer  medicines for stomach problems  methylphenidate  NSAIDs, medicines for pain and inflammation, like ibuprofen or naproxen  propoxyphene  quinidine, quinine  raloxifene  seizure or epilepsy medicine like carbamazepine, phenytoin, and valproic acid  steroids like cortisone and prednisone  tamoxifen  thyroid medicine  tramadol  vitamin c, vitamin e, and vitamin K  zafirlukast  zileuton This list may not describe all possible interactions. Give your health care provider a list of all the medicines, herbs, non-prescription drugs, or dietary  supplements you use. Also tell them if you smoke, drink alcohol, or use illegal drugs. Some items may interact with your medicine. What should I watch for while using this medicine? Visit your healthcare professional for regular checks on your progress. You will need to have a blood test called a PT/INR regularly. The PT/INR blood test is done to make sure you are getting the right dose of this medicine. It is important to not miss your  appointment for the blood tests. When you first start taking this medicine, these tests are done often. Once the correct dose is determined and you take your medicine properly, these tests can be done less often. Wear a medical ID bracelet or chain, and carry a card that describes your disease and details of your medicine and dosage times. Do not start taking or stop taking any medicines or over-the-counter medicines except on the advice of your healthcare professional. You should discuss your diet with your healthcare professional. Do not make major changes in your diet. Vitamin K can affect how well this medicine works. Many foods contain vitamin K. It is important to eat a consistent amount of foods with vitamin K. Other foods with vitamin K that you should eat in consistent amounts are asparagus, basil, black-eyed peas, broccoli, brussel sprouts, cabbage, green onions, green tea, parsley, green leafy vegetables like beet greens, collard greens, kale, spinach, turnip greens, or certain lettuces like green leaf or romaine. This medicine can cause birth defects or bleeding in an unborn child. Women of childbearing age should use effective birth control while taking this medicine. If a woman becomes pregnant while taking this medicine, she should discuss the potential risks and her options with her healthcare professional. Avoid sports and activities that might cause injury while you are using this medicine. Severe falls or injuries can cause unseen bleeding. Be careful when  using sharp tools or knives. Consider using an Neurosurgeon. Take special care brushing or flossing your teeth. Report any injuries, bruising, or red spots on the skin to your healthcare professional. If you have an illness that causes vomiting, diarrhea, or fever for more than a few days, contact your health care professional. Also, check with your healthcare professional if you are unable to eat for several days. These problems can change the effect of this medicine. Even after you stop taking this medicine, it takes several days before your body recovers its normal ability to clot blood. Ask your healthcare professional how long you need to be careful. If you are going to have surgery or dental work, tell your health care professional that you have been taking this medicine. What side effects may I notice from receiving this medicine? Side effects that you should report to your doctor or health care professional as soon as possible:  allergic reactions like skin rash, itching or hives, swelling of the face, lips, or tongue  heavy menstrual bleeding or vaginal bleeding  painful, blue or purple toes  painful skin ulcers that do not go away  signs and symptoms of bleeding such as bloody or black, tarry stools; red or dark-brown urine; spitting up blood or brown material that looks like coffee grounds; red spots on the skin; unusual bruising or bleeding from the eye, gums, or nose  signs and symptoms of a blood clot such as chest pain; shortness of breath; pain, swelling, or warmth in the leg  signs and symptoms of a stroke such as changes in vision; confusion; trouble speaking or understanding; severe headaches; sudden numbness or weakness of the face, arm or leg; trouble walking; dizziness; loss of coordination  stomach pain  unusually weak or tired Side effects that usually do not require medical attention (report to your doctor or health care professional if they continue or are  bothersome):  diarrhea  hair loss This list may not describe all possible side effects. Call your doctor for medical advice about side effects. You may report side effects to  FDA at 1-800-FDA-1088. Where should I keep my medicine? Keep out of the reach of children. Store at room temperature between 15 and 30 degrees C (59 and 86 degrees F). Protect from light. Throw away any unused medicine after the expiration date. Do not flush down the toilet. NOTE: This sheet is a summary. It may not cover all possible information. If you have questions about this medicine, talk to your doctor, pharmacist, or health care provider.  2020 Elsevier/Gold Standard (2017-09-03 13:06:45)  Vitamin K Foods and Warfarin Warfarin is a blood thinner (anticoagulant). Anticoagulant medicines help prevent the formation of blood clots. These medicines work by decreasing the activity of vitamin K, which promotes normal blood clotting. When you take warfarin, problems can occur from suddenly increasing or decreasing the amount of vitamin K that you eat from one day to the next. Problems may include:  Blood clots.  Bleeding. What general guidelines do I need to follow? To avoid problems when taking warfarin:  Eat a balanced diet that includes: ? Fresh fruits and vegetables. ? Whole grains. ? Low-fat dairy products. ? Lean proteins, such as fish, eggs, and lean cuts of meat.  Keep your intake of vitamin K consistent from day to day. To do this: ? Avoid eating large amounts of vitamin K one day and low amounts of vitamin K the next day. ? If you take a multivitamin that contains vitamin K, be sure to take it every day. ? Know which foods contain vitamin K. Use the lists below to understand serving sizes and the amount of vitamin K in one serving.  Avoid major changes in your diet. If you are going to change your diet, talk with your health care provider before making changes.  Work with a Dealer  (dietitian) to develop a meal plan that works best for you.  High vitamin K foods Foods that are high in vitamin K contain more than 100 mcg (micrograms) per serving. These include:  Broccoli (cooked) -  cup has 110 mcg.  Brussels sprouts (cooked) -  cup has 109 mcg.  Greens, beet (cooked) -  cup has 350 mcg.  Greens, collard (cooked) -  cup has 418 mcg.  Greens, turnip (cooked) -  cup has 265 mcg.  Green onions or scallions -  cup has 105 mcg.  Kale (fresh or frozen) -  cup has 531 mcg.  Parsley (raw) - 10 sprigs has 164 mcg.  Spinach (cooked) -  cup has 444 mcg.  Swiss chard (cooked) -  cup has 287 mcg. Moderate vitamin K foods Foods that have a moderate amount of vitamin K contain 25-100 mcg per serving. These include:  Asparagus (cooked) - 5 spears have 38 mcg.  Black-eyed peas (dried) -  cup has 32 mcg.  Cabbage (cooked) -  cup has 37 mcg.  Kiwi fruit - 1 medium has 31 mcg.  Lettuce - 1 cup has 57-63 mcg.  Okra (frozen) -  cup has 44 mcg.  Prunes (dried) - 5 prunes have 25 mcg.  Watercress (raw) - 1 cup has 85 mcg. Low vitamin K foods Foods low in vitamin K contain less than 25 mcg per serving. These include:  Artichoke - 1 medium has 18 mcg.  Avocado - 1 oz. has 6 mcg.  Blueberries -  cup has 14 mcg.  Cabbage (raw) -  cup has 21 mcg.  Carrots (cooked) -  cup has 11 mcg.  Cauliflower (raw) -  cup has 11 mcg.  Cucumber  with peel (raw) -  cup has 9 mcg.  Grapes -  cup has 12 mcg.  Mango - 1 medium has 9 mcg.  Nuts - 1 oz. has 15 mcg.  Pear - 1 medium has 8 mcg.  Peas (cooked) -  cup has 19 mcg.  Pickles - 1 spear has 14 mcg.  Pumpkin seeds - 1 oz. has 13 mcg.  Sauerkraut (canned) -  cup has 16 mcg.  Soybeans (cooked) -  cup has 16 mcg.  Tomato (raw) - 1 medium has 10 mcg.  Tomato sauce -  cup has 17 mcg. Vitamin K-free foods If a food contain less than 5 mcg per serving, it is considered to have no vitamin K.  These foods include:  Bread and cereal products.  Cheese.  Eggs.  Fish and shellfish.  Meat and poultry.  Milk and dairy products.  Sunflower seeds. Actual amounts of vitamin K in foods may be different depending on processing. Talk with your dietitian about what foods you can eat and what foods you should avoid. This information is not intended to replace advice given to you by your health care provider. Make sure you discuss any questions you have with your health care provider. Document Revised: 10/31/2017 Document Reviewed: 02/21/2016 Elsevier Patient Education  2020 [employer]  Bleeding Precautions When on Anticoagulant Therapy, Adult Anticoagulant therapy, also called blood thinner therapy, is medicine that helps to prevent and treat blood clots. The medicine works by stopping blood clots from forming or growing. Blood clots that form in your blood vessels can be dangerous. They can break loose and travel to the heart, lungs, or brain. This increases the risk of a heart attack, stroke, or blocked lung artery (pulmonary embolism). Anticoagulants also increase the risk of bleeding. Try to protect yourself from cuts and other injuries that can cause bleeding. It is important to take anticoagulants exactly as told by your health care provider. Why do I need to be on anticoagulant therapy? You may need this medicine if you are at risk of developing a blood clot. Conditions that increase your risk of a blood clot include:  Being born with heart disease or a heart malformation (congenital heart disease).  Developing heart disease.  Having had surgery, such as valve replacement.  Having had a serious accident or other type of severe injury (trauma).  Having certain types of cancer.  Having certain diseases that can increase blood clotting.  Having a high risk of stroke or heart attack.  Having atrial fibrillation (AF). What are the common anticoagulant medicines? There are  several types of anticoagulant medicines. The most common types are:  Medicines that you take by mouth (oral medicines), such as: ? Warfarin. ? Novel oral anticoagulants (NOACs), such as:  Direct thrombin inhibitors (dabigatran).  Factor Xa inhibitors (apixaban, edoxaban, and rivaroxaban).  Injections, such as: ? Unfractionated heparin. ? Low molecular weight heparin. These anticoagulants work in different ways to prevent blood clots. They also have different risks and side effects. What do I need to remember while on anticoagulant therapy? Taking anticoagulants  Take your medicine at the same time every day. If you forget to take your medicine, take it as soon as you remember. Do not double your dosage of medicine if you miss a whole day. Take your normal dose and call your health care provider.  Do not stop taking your medicine unless your health care provider approves. Stopping the medicine can increase your risk of developing a blood clot.  Taking other medicines  Take over-the-counter and prescriptions medicines only as told by your health care provider.  Do not take over-the-counter NSAIDs, including aspirin and ibuprofen, while you are on anticoagulant therapy. These medicines increase your risk of dangerous bleeding.  Get approval from your health care provider before you start taking any new medicines, vitamins, or herbal products. Some of these could interfere with your therapy. General instructions  Keep all follow-up visits as told by your health care provider. This is important.  If you are pregnant or trying to get pregnant, talk with a health care provider about anticoagulants. Some of these medicines are not safe to take during pregnancy.  Tell all health care providers, including your dentist, that you are on anticoagulant therapy. It is especially important to tell providers before you have any surgery, medical procedures, or dental work done. What precautions  should I take?   Be very careful when using knives, scissors, or other sharp objects.  Use an electric razor instead of a blade.  Do not use toothpicks.  Use a soft-bristled toothbrush. Brush your teeth gently.  Always wear shoes outdoors and wear slippers indoors.  Be careful when cutting your fingernails and toenails.  Place bath mats in the bathroom. If possible, install handrails as well.  Wear gloves while you do yard work.  Wear your seat belt.  Prevent falls by removing loose rugs and extension cords from areas where you walk. Use a cane or walker if you need it.  Avoid constipation by: ? Drinking enough fluid to keep your urine clear or pale yellow. ? Eating foods that are high in fiber, such as fresh fruits and vegetables, whole grains, and beans. ? Limiting foods that are high in fat and processed sugars, such as fried and sweet foods.  Do not play contact sports or participate in other activities that have a high risk for injury. What other precautions are important if on warfarin therapy? If you are taking a type of anticoagulant called warfarin, make sure you:  Work with a diet and nutrition specialist (dietitian) to make an eating plan. Do not make any sudden changes to your diet after you have started your eating plan.  Do not drink alcohol. It can interfere with your medicine and increase your risk of an injury that causes bleeding.  Get regular blood tests as told by your health care provider. What are some questions to ask my health care provider?  Why do I need anticoagulant therapy?  What is the best anticoagulant therapy for my condition?  How long will I need anticoagulant therapy?  What are the side effects of anticoagulant therapy?  When should I take my medicine? What should I do if I forget to take it?  Will I need to have regular blood tests?  Do I need to change my diet? Are there foods or drinks that I should avoid?  What activities  are safe for me?  What should I do if I want to get pregnant? Contact a health care provider if:  You miss a dose of medicine: ? And you are not sure what to do. ? For more than one day.  You have: ? Menstrual bleeding that is heavier than normal. ? Bloody or brown urine. ? Easy bruising. ? Black and tarry stool or bright red stool. ? Side effects from your medicine.  You feel weak or dizzy.  You become pregnant. Get help right away if:  You have bleeding that will  not stop within 20 minutes from: ? The nose. ? The gums. ? A cut on the skin.  You have a severe headache or stomachache.  You vomit or cough up blood.  You fall or hit your head. Summary  Anticoagulant therapy, also called blood thinner therapy, is medicine that helps to prevent and treat blood clots.  Anticoagulants work in different ways to prevent blood clots. They also have different risks and side effects.  Talk with your health care provider about any precautions that you should take while on anticoagulant therapy. This information is not intended to replace advice given to you by your health care provider. Make sure you discuss any questions you have with your health care provider. Document Revised: 03/10/2019 Document Reviewed: 02/04/2017 Elsevier Patient Education  2020 [employer]   Patient advised to contact clinic or seek medical attention if signs/symptoms of bleeding or thromboembolism occur.  Patient verbalized understanding by repeating back information and was advised to contact me if further medication-related questions arise.   Follow-up Return in about 3 days (around 07/03/2020).  Leonides Schanzeny T Ferol Laiche, PharmD  15 minutes spent face-to-face with the patient during the encounter. 50% of time spent on education, including signs/sx bleeding and clotting, as well as food and drug interactions with warfarin. 50% of time was spent on fingerprick POC INR sample collection,processing, results  determination, and documentation

## 2020-07-03 ENCOUNTER — Other Ambulatory Visit: Payer: Self-pay

## 2020-07-03 ENCOUNTER — Ambulatory Visit: Payer: Medicare Other | Admitting: Pharmacist

## 2020-07-03 DIAGNOSIS — Z7901 Long term (current) use of anticoagulants: Secondary | ICD-10-CM

## 2020-07-03 DIAGNOSIS — Z5181 Encounter for therapeutic drug level monitoring: Secondary | ICD-10-CM

## 2020-07-03 DIAGNOSIS — I480 Paroxysmal atrial fibrillation: Secondary | ICD-10-CM

## 2020-07-03 LAB — POCT INR: INR: 1.6 — AB (ref 2.0–3.0)

## 2020-07-03 NOTE — Patient Instructions (Signed)
INR below goal. Increase weekly dose to 7.5 mg every Mon and Wed and 10 mg all other days. Recheck INR in 4 days.

## 2020-07-03 NOTE — Progress Notes (Signed)
Anticoagulation Management Phyllis Knight is a 79 y.o. female who reports to the clinic for monitoring of warfarin treatment.    Indication: atrial fibrillation CHA2DS2 Vasc Score 4 (Age >75, Female, HTN hx), HAS-BLED 1 (Age>65,   Duration: indefinite Supervising physician: [facility] Patwardhan  Anticoagulation Clinic Visit History:  Started warfarin on 06/28/20 following [facility] bridge for 2 days. Pt transitioned to warfarin due to cost reasons. Pt doesn't remember applying for patient assistance. Completed patient assistance application paperwork together and faxed necessary documentation on 07/03/20.   Patient does not report signs/symptoms of bleeding or thromboembolism   Other recent changes: No change diet, medications, lifestyle. Had 2 servings of green beans over the weekend.   Anticoagulation Episode Summary    Current INR goal:  2.0-3.0  TTR:  --  Next INR check:  07/07/2020  INR from last check:  1.6 (07/03/2020)  Weekly max warfarin dose:    Target end date:    INR check location:    Preferred lab:    Send INR reminders to:     Indications   Paroxysmal A-fib (HCC) [I48.0] Monitoring for long-term anticoagulant use [Z51.81 Z79.01]       Comments:        Anticoagulation Care Providers    Provider Role Specialty Phone number   Elder Negus, MD Referring Cardiology [phone]     Allergies  Allergen Reactions  . Codeine Nausea And Vomiting  . Zofran [Ondansetron Hcl] Rash    Current Outpatient Medications:  .  acetaminophen (TYLENOL) 500 MG tablet, Take 500 mg by mouth every 6 (six) hours as needed for mild pain, moderate pain, fever or headache. , Disp: , Rfl:  .  albuterol (PROVENTIL HFA;VENTOLIN HFA) 108 (90 Base) MCG/ACT inhaler, Inhale 2 puffs into the lungs every 4 (four) hours as needed for wheezing or shortness of breath. , Disp: , Rfl:  .  amLODipine (NORVASC) 5 MG tablet, Take 5 mg by mouth daily. Taking it at night, Disp: , Rfl:  .  cetirizine  (ZYRTEC) 10 MG tablet, Take 10 mg by mouth daily., Disp: , Rfl:  .  cholecalciferol (VITAMIN D) 1000 units tablet, Take 2,000 Units by mouth daily. , Disp: , Rfl:  .  Colchicine 0.6 MG CAPS, Take 2 capsules by mouth as directed., Disp: , Rfl:  .  furosemide (LASIX) 20 MG tablet, Take 1 tablet (20 mg total) by mouth daily., Disp: 90 tablet, Rfl: 2 .  metoprolol succinate (TOPROL-XL) 50 MG 24 hr tablet, TAKE 1 TABLET BY MOUTH  DAILY WITH OR IMMEDIATELY  FOLLOWING A MEAL (Patient taking differently: Taking in the morning), Disp: 30 tablet, Rfl: 6 .  nitroGLYCERIN (NITROSTAT) 0.4 MG SL tablet, Place 1 tablet (0.4 mg total) under the tongue every 5 (five) minutes as needed for chest pain., Disp: 30 tablet, Rfl: 3 .  olmesartan (BENICAR) 20 MG tablet, Take 1 tablet (20 mg total) by mouth daily. (Patient taking differently: Take 10 mg by mouth daily. Taking in the morning), Disp: 30 tablet, Rfl: 1 .  pantoprazole (PROTONIX) 40 MG tablet, Take 40 mg by mouth daily. , Disp: , Rfl:  .  potassium chloride (KLOR-CON) 8 MEQ tablet, Take 8 mEq by mouth daily. , Disp: , Rfl:  .  rosuvastatin (CRESTOR) 10 MG tablet, Take 1 tablet (10 mg total) by mouth daily., Disp: 30 tablet, Rfl: 3 .  warfarin (COUMADIN) 5 MG tablet, Take 1 tablet (5 mg total) by mouth daily., Disp: 90 tablet, Rfl: 3 Past  Medical History:  Diagnosis Date  . Anemia    pt denies  . Arthritis   . Atrial fibrillation (HCC)   . Bradycardia   . Bronchitis    hx of   . Chest pain 06-10-14   had chest pain thia am  . Dizziness   . Fibromyalgia   . 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 GERD (gastroesophageal reflux disease)   . H/O hiatal hernia   . Hearing loss    left ear   . History of uterine cancer 1975   s/p hysterectomy  . Hypertension   . Numbness and tingling    hands and feet bilat   . Pneumonia yrs ago  . Shortness of breath dyspnea   . Syncope 07/16/2018  . Tinnitus   . Urge urinary incontinence   .  Urinary incontinence   . Vitamin D deficiency    ASSESSMENT  Recent Results: The most recent result is correlated with 40 mg per week:  Lab Results  Component Value Date   INR 1.6 (A) 07/03/2020   INR 1.2 (A) 06/30/2020   INR 1.12 07/21/2013    Anticoagulation Dosing: Description   INR below goal. Increase weekly dose to 7.5 mg every Mon and Wed and 10 mg all other days. Recheck INR in 4 days.      INR today: Subtherapeutic. New start warfarin. INR trending up since the boost dose last Friday. Pt had two servings of VitK meals over the weekend. Pt denies any s/sx of stroke like symptoms. Completed Eliquis patient assistance application and faxed to [facility]. Reviewed food diary together. Will continue close monitoring to ensure pt is able to get to therapeutic range safely.   PLAN Weekly dose was increased by 14.3% yo 40 mg/week. Increase dose to 7.5 mg every Mon and Wed and 5 mg all other days. Recheck INR in 4 days.   Patient Instructions  INR below goal. Increase weekly dose to 7.5 mg every Mon and Wed and 10 mg all other days. Recheck INR in 4 days.   Patient advised to contact clinic or seek medical attention if signs/symptoms of bleeding or thromboembolism occur.  Patient verbalized understanding by repeating back information and was advised to contact me if further medication-related questions arise.   Follow-up Return in about 4 days (around 07/07/2020).  Leonides Schanz, PharmD  15 minutes spent face-to-face with the patient during the encounter. 50% of time spent on education, including signs/sx bleeding and clotting, as well as food and drug interactions with warfarin. 50% of time was spent on fingerprick POC INR sample collection,processing, results determination, and documentation

## 2020-07-07 ENCOUNTER — Encounter: Payer: Self-pay | Admitting: Cardiology

## 2020-07-07 ENCOUNTER — Other Ambulatory Visit: Payer: Self-pay

## 2020-07-07 ENCOUNTER — Ambulatory Visit: Payer: Medicare Other | Admitting: Cardiology

## 2020-07-07 ENCOUNTER — Ambulatory Visit: Payer: Medicare Other | Admitting: Pharmacist

## 2020-07-07 VITALS — BP 128/69 | HR 62 | Resp 17 | Ht 66.0 in | Wt 213.0 lb

## 2020-07-07 DIAGNOSIS — I480 Paroxysmal atrial fibrillation: Secondary | ICD-10-CM

## 2020-07-07 DIAGNOSIS — Z7901 Long term (current) use of anticoagulants: Secondary | ICD-10-CM

## 2020-07-07 DIAGNOSIS — I10 Essential (primary) hypertension: Secondary | ICD-10-CM

## 2020-07-07 DIAGNOSIS — R600 Localized edema: Secondary | ICD-10-CM

## 2020-07-07 DIAGNOSIS — Z5181 Encounter for therapeutic drug level monitoring: Secondary | ICD-10-CM

## 2020-07-07 DIAGNOSIS — E782 Mixed hyperlipidemia: Secondary | ICD-10-CM

## 2020-07-07 LAB — POCT INR: INR: 1.8 — AB (ref 2.0–3.0)

## 2020-07-07 NOTE — Addendum Note (Signed)
Addended by: Cassell Clement T on: 07/07/2020 02:29 PM   Modules accepted: Orders

## 2020-07-07 NOTE — Progress Notes (Addendum)
Anticoagulation Management Phyllis Knight is a 79 y.o. female who reports to the clinic for monitoring of warfarin treatment.    Indication: atrial fibrillation CHA2DS2 Vasc Score 4 (Age >75, Female, HTN hx), HAS-BLED 1 (Age>65,   Duration: indefinite Supervising physician: Truett Mainland  Anticoagulation Clinic Visit History:  Started warfarin on 06/28/20 following [place] [facility] bridge for 2 days. Pt transitioned to warfarin due to cost reasons. Pt doesn't remember applying for patient assistance. Completed patient assistance application paperwork together and faxed necessary documentation on 07/03/20.  Received notification that pt qualified for Eliquis patient assistance on 07/06/20.   Patient does not report signs/symptoms of bleeding or thromboembolism   Other recent changes: No change diet, medications, lifestyle. Reports to have decreased green beans intake.   PCP OV yesterday and pt was told that she has fibromyalgia. Started on PRN tramadol. Has a f/u OV w/ PCP in 3 weeks.  Complains of left leg pain above the knee. PMH does include s/p left TKA.  Associated symptoms of unilateral swelling and warm to touch per pt. Pt is currently being scheduled for knee surgery.   Anticoagulation Episode Summary    Current INR goal:  2.0-3.0  TTR:  --  Next INR check:  07/07/2020  INR from last check:  1.8 (07/07/2020)  Weekly max warfarin dose:    Target end date:    INR check location:    Preferred lab:    Send INR reminders to:     Indications   Paroxysmal A-fib (HCC) [I48.0] Monitoring for long-term anticoagulant use [Z51.81 Z79.01]       Comments:        Anticoagulation Care Providers    Provider Role Specialty Phone number   Elder Negus, MD Referring Cardiology [phone]     Allergies  Allergen Reactions  . Codeine Nausea And Vomiting  . Zofran [Ondansetron Hcl] Rash    Current Outpatient Medications:  .  apixaban (ELIQUIS) 5 MG TABS tablet, Take 5 mg by mouth 2  (two) times daily., Disp: , Rfl:  .  acetaminophen (TYLENOL) 500 MG tablet, Take 500 mg by mouth every 6 (six) hours as needed for mild pain, moderate pain, fever or headache. , Disp: , Rfl:  .  albuterol (PROVENTIL HFA;VENTOLIN HFA) 108 (90 Base) MCG/ACT inhaler, Inhale 2 puffs into the lungs every 4 (four) hours as needed for wheezing or shortness of breath.  (Patient not taking: Reported on 07/07/2020), Disp: , Rfl:  .  amLODipine (NORVASC) 5 MG tablet, Take 5 mg by mouth daily. Taking it at night, Disp: , Rfl:  .  cetirizine (ZYRTEC) 10 MG tablet, Take 10 mg by mouth daily., Disp: , Rfl:  .  cholecalciferol (VITAMIN D) 1000 units tablet, Take 2,000 Units by mouth daily. , Disp: , Rfl:  .  Colchicine 0.6 MG CAPS, Take 2 capsules by mouth as directed. (Patient not taking: Reported on 07/07/2020), Disp: , Rfl:  .  furosemide (LASIX) 20 MG tablet, Take 1 tablet (20 mg total) by mouth daily., Disp: 90 tablet, Rfl: 2 .  metoprolol succinate (TOPROL-XL) 50 MG 24 hr tablet, TAKE 1 TABLET BY MOUTH  DAILY WITH OR IMMEDIATELY  FOLLOWING A MEAL (Patient taking differently: Taking in the morning), Disp: 30 tablet, Rfl: 6 .  nitroGLYCERIN (NITROSTAT) 0.4 MG SL tablet, Place 1 tablet (0.4 mg total) under the tongue every 5 (five) minutes as needed for chest pain. (Patient not taking: Reported on 07/07/2020), Disp: 30 tablet, Rfl: 3 .  olmesartan (BENICAR)  20 MG tablet, Take 1 tablet (20 mg total) by mouth daily. (Patient taking differently: Take 10 mg by mouth daily. Taking in the morning), Disp: 30 tablet, Rfl: 1 .  pantoprazole (PROTONIX) 40 MG tablet, Take 40 mg by mouth daily. , Disp: , Rfl:  .  potassium chloride (KLOR-CON) 8 MEQ tablet, Take 8 mEq by mouth daily. , Disp: , Rfl:  .  rosuvastatin (CRESTOR) 10 MG tablet, Take 1 tablet (10 mg total) by mouth daily., Disp: 30 tablet, Rfl: 3 .  traMADol (ULTRAM) 50 MG tablet, Take 50 mg by mouth every 4 (four) hours as needed., Disp: , Rfl:  Past Medical History:   Diagnosis Date  . Anemia    pt denies  . Arthritis   . Atrial fibrillation (HCC)   . Bradycardia   . Bronchitis    hx of   . Chest pain 06-10-14   had chest pain thia am  . Dizziness   . Fibromyalgia   . 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 GERD (gastroesophageal reflux disease)   . H/O hiatal hernia   . Hearing loss    left ear   . History of uterine cancer 1975   s/p hysterectomy  . Hypertension   . Numbness and tingling    hands and feet bilat   . Pneumonia yrs ago  . Shortness of breath dyspnea   . Syncope 07/16/2018  . Tinnitus   . Urge urinary incontinence   . Urinary incontinence   . Vitamin D deficiency    ASSESSMENT  Recent Results: The most recent result is correlated with 42.5 mg per week:  Lab Results  Component Value Date   INR 1.8 (A) 07/07/2020   INR 1.6 (A) 07/03/2020   INR 1.2 (A) 06/30/2020    Anticoagulation Dosing: Description   STOP WARFARIN. START ELIQUIS 5 mg BID starting today.      INR today: Subtherapeutic. New start warfarin. INR trending up slowly but still remains subtherapeutic. Recent weekly dose increase. Eliquis patient assistance approved. Per Eliquis package insert, okay to stop warfarin and start Eliquis when INR <2. Provided pt with Eliquis 5 mg sample. Reviewed indications, dosing directions, side effects, interactions, and monitoring parameters. Pt verbalized understanding and will be starting Elqiuis 5 mg BID starting 07/07/20  PLAN STOP warfarin. Start Eliquis 5 mg BID.   Patient Instructions  INR below goal. Increase weekly dose to 7.5 mg every Mon, Wed, Fri and 10 mg all other days. Recheck INR in 1 week.   Patient advised to contact clinic or seek medical attention if signs/symptoms of bleeding or thromboembolism occur.  Patient verbalized understanding by repeating back information and was advised to contact me if further medication-related questions arise.   Follow-up Return in about 6  days (around 07/13/2020).  Leonides Schanz, PharmD  15 minutes spent face-to-face with the patient during the encounter. 50% of time spent on education, including signs/sx bleeding and clotting, as well as food and drug interactions with warfarin. 50% of time was spent on fingerprick POC INR sample collection,processing, results determination, and documentation

## 2020-07-07 NOTE — Patient Instructions (Addendum)
STOP WARFARIN. START ELIQUIS 5 mg BID starting today.

## 2020-07-07 NOTE — Progress Notes (Signed)
Follow up visit  Subjective:   Phyllis Knight, female    DOB: [birth year], 79 y.o.   MRN: [id number]    HPI  Chief Complaint  Patient presents with  . Atrial Fibrillation  . Leg Swelling    left  . Follow-up    79 y.o.African Americanfemalewith hypertension, fibromyalgia, paroxysmal Afib,   Acute visit was made due to complaints of leg pain and swelling that started last night. She has not had chest pain, no dyspnea.   On a separate note, she has been approved for patient assistance for eliquis. She is presently on Coumadin.   Current Outpatient Medications on File Prior to Visit  Medication Sig Dispense Refill  . acetaminophen (TYLENOL) 500 MG tablet Take 500 mg by mouth every 6 (six) hours as needed for mild pain, moderate pain, fever or headache.     Marland Kitchen amLODipine (NORVASC) 5 MG tablet Take 5 mg by mouth daily. Taking it at night    . apixaban (ELIQUIS) 5 MG TABS tablet Take 5 mg by mouth 2 (two) times daily.    . cetirizine (ZYRTEC) 10 MG tablet Take 10 mg by mouth daily.    . cholecalciferol (VITAMIN D) 1000 units tablet Take 2,000 Units by mouth daily.     . furosemide (LASIX) 20 MG tablet Take 1 tablet (20 mg total) by mouth daily. 90 tablet 2  . metoprolol succinate (TOPROL-XL) 50 MG 24 hr tablet TAKE 1 TABLET BY MOUTH  DAILY WITH OR IMMEDIATELY  FOLLOWING A MEAL (Patient taking differently: Taking in the morning) 30 tablet 6  . olmesartan (BENICAR) 20 MG tablet Take 1 tablet (20 mg total) by mouth daily. (Patient taking differently: Take 10 mg by mouth daily. Taking in the morning) 30 tablet 1  . pantoprazole (PROTONIX) 40 MG tablet Take 40 mg by mouth daily.     . potassium chloride (KLOR-CON) 8 MEQ tablet Take 8 mEq by mouth daily.     . rosuvastatin (CRESTOR) 10 MG tablet Take 1 tablet (10 mg total) by mouth daily. 30 tablet 3  . traMADol (ULTRAM) 50 MG tablet Take 50 mg by mouth every 4 (four) hours as needed.    Marland Kitchen albuterol (PROVENTIL HFA;VENTOLIN HFA) 108  (90 Base) MCG/ACT inhaler Inhale 2 puffs into the lungs every 4 (four) hours as needed for wheezing or shortness of breath.  (Patient not taking: Reported on 07/07/2020)    . Colchicine 0.6 MG CAPS Take 2 capsules by mouth as directed. (Patient not taking: Reported on 07/07/2020)    . nitroGLYCERIN (NITROSTAT) 0.4 MG SL tablet Place 1 tablet (0.4 mg total) under the tongue every 5 (five) minutes as needed for chest pain. (Patient not taking: Reported on 07/07/2020) 30 tablet 3   No current facility-administered medications on file prior to visit.    Cardiovascular & other pertient studies:  Lexiscan Myoview stress test 06/07/2019: Lexiscan stress test was performed. Stress EKG is non-diagnostic, as this is pharmacological stress test. In addition, Rest and stress EKG reveal sinus rhythm, frequent PVC's and occasional PAC's.  SPECT images revealed small sized, mildly reversible, mild intensity perfusion defect in basal inferior myocardium. While breast attenuation is possible (imaging performed in sitting position), small area of ischemia cannot be excluded. LVEF 73% with normal wall motion. Low risk study.   Event monitor 04/28/2019 - 05/11/2019:  1 auto triggered event shows sinus rhythm. No patient triggered events seen. No Afib.    Echocardiogram 02/16/2019:  1. The left ventricle has  normal systolic function with an ejection fraction of 60-65%. The cavity size was normal. There is mild concentric left ventricular hypertrophy. Diastolic function could not be assessed due to atrial fibrillation.  2. Low normal RV systolic function.  3. Mild left atrial dilatation. Moderate right atrial dilatation.  4. Mild to moderate mitral and tricuspid regurgitation. RVSP 29 mmHg.  5. Vavlular regurgitation and chamber dilatation new since previous echocardiogram on 07/17/2018.   Recent labs: 01/12/2020: Glucose 81, BUN/Cr 13/0.66. EGFR 97. Na/K 142/4.5. Rest of the CMP normal Chol 207, TG 119, HDL 47,  LDL 139.  02/15/2019: Glucose 211, BUN/Cr 12/0.66. EGFR >60. Na/K 135/3.7. Rest of the CMP normal H/H 12/37. MCV 92. Platelets 216 TSH 0.5 normal.    Review of Systems  Cardiovascular: Negative for chest pain, dyspnea on exertion, leg swelling, palpitations and syncope.  Musculoskeletal: Positive for joint pain and joint swelling.   Vitals:   07/07/20 1400  BP: 128/69  Pulse: 62  Resp: 17  SpO2: 99%   Body mass index is 34.38 kg/m. Filed Weights   07/07/20 1400  Weight: 213 lb (96.6 kg)   Objective:   Physical Exam Vitals and nursing note reviewed.  Constitutional:      General: She is not in acute distress.    Appearance: She is obese.  Neck:     Vascular: No JVD.  Cardiovascular:     Rate and Rhythm: Normal rate and regular rhythm.     Pulses: Intact distal pulses.     Heart sounds: Murmur heard.  Midsystolic murmur is present with a grade of 2/6 at the upper right sternal border.      Comments: No JVD. Trace pedal edema.  Large adipose tissue present in bilateral lower extremity.  No signs of inflammation.  Pulmonary:     Effort: Pulmonary effort is normal.     Breath sounds: Normal breath sounds. No wheezing or rales.       Assessment & Recommendations:   79 y.o.African Americanfemalewith hypertension, fibromyalgia, paroxysmal Afib, hyperlipidemia seen as a sick work in for left leg swelling and pain with a suspicion for DVT.  Left leg swelling and pain is nonspecific.  Suspect she has significant arthritis involving her knee joint bilaterally, she has had left knee replacement.  Do not suspect DVT.  She is also on Coumadin, although INR is subtherapeutic today, she is starting Eliquis tonight which she has been approved.  Hypertension: Controlled she is tolerating all medications well.  Paroxysmal Afib:  Has not had any known recurrence of A fib. No A fib seen on event monitor.  CHA2DS2VASc score 4. Annual stroke risk 5%. Continue Eliquis 5 mg p.o.  twice daily.  She can discontinue Coumadin today. Rhythm appears normal on ausculatation.  Hyperlipidemia: Tolerating Crestor.  Pre-op risk stratification: Schedule for right knee replacement. Low risk stress test. Acceptable cardiac risk for knee surgery. Eliquis can be held for 2 days prior to the surgery.  She will continue her present medications, we will see her back in 6 months for follow-up. Obtain lipid panel.     ICD-10-CM   1. Leg edema  R60.0   2. Paroxysmal A-fib (HCC)  I48.0   3. Essential hypertension  I10   4. Mixed hyperlipidemia  E78.2 Lipid Panel With LDL/HDL Ratio      Adrian Prows, MD, [facility] 07/07/2020, 4:24 PM Office: [phone]

## 2020-10-13 NOTE — Progress Notes (Signed)
Rescheduled

## 2020-10-16 ENCOUNTER — Ambulatory Visit: Payer: Medicare Other | Admitting: Cardiology

## 2020-10-18 ENCOUNTER — Telehealth: Payer: Self-pay

## 2020-10-18 ENCOUNTER — Other Ambulatory Visit: Payer: Self-pay

## 2020-10-18 ENCOUNTER — Ambulatory Visit: Payer: Medicare Other | Admitting: Cardiology

## 2020-10-18 ENCOUNTER — Encounter: Payer: Self-pay | Admitting: Cardiology

## 2020-10-18 VITALS — BP 155/87 | HR 60 | Resp 16 | Ht 66.0 in | Wt 214.8 lb

## 2020-10-18 DIAGNOSIS — I480 Paroxysmal atrial fibrillation: Secondary | ICD-10-CM

## 2020-10-18 DIAGNOSIS — I10 Essential (primary) hypertension: Secondary | ICD-10-CM

## 2020-10-18 DIAGNOSIS — E782 Mixed hyperlipidemia: Secondary | ICD-10-CM

## 2020-10-18 MED ORDER — AMLODIPINE BESYLATE 10 MG PO TABS: 10.0000 mg | ORAL_TABLET | Freq: Every day | ORAL | 2 refills | Status: DC

## 2020-10-18 NOTE — Telephone Encounter (Signed)
Patient brought medication bottles into room to be reconciled. Tramadol was in the bag with other included medications. I personally gave the patient's husband the bottle to his person after medications were reconciled. I informed patient that because the medication is controlled, it does not need to be carried with the patient to visits. Patient and husband voiced understanding.

## 2020-10-18 NOTE — Progress Notes (Signed)
Follow up visit  Subjective:   Phyllis Knight, female    DOB: Mar 23, 1941, 79 y.o.   MRN: [id number]    HPI   Chief Complaint  Patient presents with  . Hypertension  . Hyperlipidemia  . Follow-up    6 month    79 y.o.African Americanfemalewith hypertension, fibromyalgia, paroxysmal Afib  Patients primary complaint today is her right knee pain. She is yet to undergo knee replacement surgery. She has occasional episodes of pain on either side of her chest, that last for 1-2 seconds. Stress test performed for similar symptoms, in 06/2019, did not show ischemia. Blood pressure is uncontrolled. It appears that she is not taking olmesartan that she was previously on.    Current Outpatient Medications on File Prior to Visit  Medication Sig Dispense Refill  . acetaminophen (TYLENOL) 500 MG tablet Take 500 mg by mouth every 6 (six) hours as needed for mild pain, moderate pain, fever or headache.     . albuterol (PROVENTIL HFA;VENTOLIN HFA) 108 (90 Base) MCG/ACT inhaler Inhale 2 puffs into the lungs every 4 (four) hours as needed for wheezing or shortness of breath.     Marland Kitchen amLODipine (NORVASC) 5 MG tablet Take 5 mg by mouth daily. Taking it at night    . apixaban (ELIQUIS) 5 MG TABS tablet Take 5 mg by mouth 2 (two) times daily.    . cetirizine (ZYRTEC) 10 MG tablet Take 10 mg by mouth daily.    . cholecalciferol (VITAMIN D) 1000 units tablet Take 2,000 Units by mouth daily.     . furosemide (LASIX) 20 MG tablet Take 1 tablet (20 mg total) by mouth daily. 90 tablet 2  . metoprolol succinate (TOPROL-XL) 50 MG 24 hr tablet TAKE 1 TABLET BY MOUTH  DAILY WITH OR IMMEDIATELY  FOLLOWING A MEAL (Patient taking differently: Taking in the morning) 30 tablet 6  . nitrofurantoin, macrocrystal-monohydrate, (MACROBID) 100 MG capsule Take 100 mg by mouth 2 (two) times daily.    . pantoprazole (PROTONIX) 40 MG tablet Take 40 mg by mouth daily.     . potassium chloride (KLOR-CON) 8 MEQ tablet Take 8  mEq by mouth daily.     . rosuvastatin (CRESTOR) 10 MG tablet Take 1 tablet (10 mg total) by mouth daily. 30 tablet 3  . traMADol-acetaminophen (ULTRACET) 37.5-325 MG tablet SMARTSIG:By Mouth    . Colchicine 0.6 MG CAPS Take 2 capsules by mouth as directed. (Patient not taking: Reported on 10/18/2020)    . nitroGLYCERIN (NITROSTAT) 0.4 MG SL tablet Place 1 tablet (0.4 mg total) under the tongue every 5 (five) minutes as needed for chest pain. (Patient not taking: Reported on 10/18/2020) 30 tablet 3  . olmesartan (BENICAR) 20 MG tablet Take 1 tablet (20 mg total) by mouth daily. (Patient taking differently: Take 10 mg by mouth daily. Taking in the morning) 30 tablet 1  . traMADol (ULTRAM) 50 MG tablet Take 50 mg by mouth every 4 (four) hours as needed. (Patient not taking: Reported on 10/18/2020)     No current facility-administered medications on file prior to visit.    Cardiovascular & other pertient studies:  EKG 10/18/2020: Sinus rhythm 64 bpm Early R wave profression, consider old posterior infarct  Lexiscan Myoview stress test 06/07/2019: Lexiscan stress test was performed. Stress EKG is non-diagnostic, as this is pharmacological stress test. In addition, Rest and stress EKG reveal sinus rhythm, frequent PVC's and occasional PAC's.  SPECT images revealed small sized, mildly reversible, mild intensity perfusion  defect in basal inferior myocardium. While breast attenuation is possible (imaging performed in sitting position), small area of ischemia cannot be excluded. LVEF 73% with normal wall motion. Low risk study.   Event monitor 04/28/2019 - 05/11/2019:  1 auto triggered event shows sinus rhythm. No patient triggered events seen. No Afib.    Echocardiogram 02/16/2019:  1. The left ventricle has normal systolic function with an ejection fraction of 60-65%. The cavity size was normal. There is mild concentric left ventricular hypertrophy. Diastolic function could not be assessed due  to atrial fibrillation.  2. Low normal RV systolic function.  3. Mild left atrial dilatation. Moderate right atrial dilatation.  4. Mild to moderate mitral and tricuspid regurgitation. RVSP 29 mmHg.  5. Vavlular regurgitation and chamber dilatation new since previous echocardiogram on 07/17/2018.   Recent labs: 01/12/2020: Glucose 81, BUN/Cr 13/0.66. EGFR 97. Na/K 142/4.5. Rest of the CMP normal Chol 207, TG 119, HDL 47, LDL 139.  02/15/2019: Glucose 211, BUN/Cr 12/0.66. EGFR >60. Na/K 135/3.7. Rest of the CMP normal H/H 12/37. MCV 92. Platelets 216 TSH 0.5 normal.   Review of Systems  Cardiovascular: Negative for chest pain, dyspnea on exertion, leg swelling, palpitations and syncope.           Vitals:   10/18/20 1430  BP: (!) 155/87  Pulse: 60  Resp: 16  SpO2: 99%     Body mass index is 34.67 kg/m. Filed Weights   10/18/20 1430  Weight: 214 lb 12.8 oz (97.4 kg)     Objective:  Physical Exam Vitals and nursing note reviewed.  Constitutional:      General: She is not in acute distress. Neck:     Vascular: No JVD.  Cardiovascular:     Rate and Rhythm: Normal rate and regular rhythm.     Heart sounds: Normal heart sounds. No murmur heard.   Pulmonary:     Effort: Pulmonary effort is normal.     Breath sounds: Normal breath sounds. No wheezing or rales.         Assessment & Recommendations:   79 y.o.African Americanfemalewith hypertension, fibromyalgia, paroxysmal Afib  Hypertension: Uncontrolled. Increased amlodipine to 10 mg.   Paroxysmal Afib:  Has not had any known recurrence of A fib. No A fib seen on event monitor.  CHA2DS2VASc score 4. Annual stroke risk 5%. Continue Eliquis 5 mg twice daily.   Hyperlipidemia: Currently on Crestor 10 mg daily. Will check lipid panel  Pre-op risk stratification: Low risk stress test. Acceptable cardiac risk for knee surgery. Eliquis can be held for 2 days prior to the surgery.  F/u in 3  months  [place], MD [facility] Pager: [phone] Office: [phone] If no answer Cell [phone]

## 2020-10-19 ENCOUNTER — Other Ambulatory Visit: Payer: Self-pay

## 2020-10-19 DIAGNOSIS — I10 Essential (primary) hypertension: Secondary | ICD-10-CM

## 2020-10-19 MED ORDER — AMLODIPINE BESYLATE 10 MG PO TABS: 10.0000 mg | ORAL_TABLET | Freq: Every day | ORAL | 2 refills | Status: DC

## 2020-11-10 ENCOUNTER — Other Ambulatory Visit: Payer: Self-pay

## 2020-11-10 ENCOUNTER — Emergency Department (HOSPITAL_COMMUNITY): Payer: Medicare Other

## 2020-11-10 ENCOUNTER — Emergency Department (HOSPITAL_COMMUNITY)
Admission: EM | Admit: 2020-11-10 | Discharge: 2020-11-10 | Disposition: A | Discharge status: Home / Self Care | Payer: Medicare Other | Attending: Emergency Medicine | Admitting: Emergency Medicine

## 2020-11-10 ENCOUNTER — Encounter (HOSPITAL_COMMUNITY): Payer: Self-pay

## 2020-11-10 DIAGNOSIS — Z7901 Long term (current) use of anticoagulants: Secondary | ICD-10-CM | POA: Diagnosis not present

## 2020-11-10 DIAGNOSIS — Z96652 Presence of left artificial knee joint: Secondary | ICD-10-CM | POA: Diagnosis not present

## 2020-11-10 DIAGNOSIS — K5900 Constipation, unspecified: Secondary | ICD-10-CM | POA: Insufficient documentation

## 2020-11-10 DIAGNOSIS — Z8542 Personal history of malignant neoplasm of other parts of uterus: Secondary | ICD-10-CM | POA: Diagnosis not present

## 2020-11-10 DIAGNOSIS — I10 Essential (primary) hypertension: Secondary | ICD-10-CM | POA: Insufficient documentation

## 2020-11-10 DIAGNOSIS — Z79899 Other long term (current) drug therapy: Secondary | ICD-10-CM | POA: Diagnosis not present

## 2020-11-10 DIAGNOSIS — R52 Pain, unspecified: Secondary | ICD-10-CM | POA: Insufficient documentation

## 2020-11-10 DIAGNOSIS — R109 Unspecified abdominal pain: Secondary | ICD-10-CM

## 2020-11-10 MED ORDER — POLYETHYLENE GLYCOL 3350 17 GM/SCOOP PO POWD: 1.0000 | Freq: Once | ORAL | 0 refills | Status: AC

## 2020-11-10 MED ORDER — ACETAMINOPHEN 500 MG PO TABS
1000.0000 mg | ORAL_TABLET | Freq: Once | ORAL | Status: AC
  Administered 2020-11-10: 1000 mg via ORAL
  Filled 2020-11-10: qty 2

## 2020-11-10 NOTE — ED Provider Notes (Signed)
  Face-to-face evaluation   History: She presents for evaluation of left sided lower chest pain, which started today.  She also reports having decreased stooling with "hard bowel movements," last bowel movement was 2 days ago.  She denies vomiting, fever, chills, focal weakness paresthesia.  She is not taking narcotic pain medications.  She took a dose of MiraLAX couple days ago without relief of constipation.  She is recovering from a right sided rib fracture.  Physical exam: Alert elderly female who is overweight.  Abdomen is soft with mild guarding in the left upper quadrant.  No rebound tenderness.  Left chest wall is not significantly tender.  Medical screening examination/treatment/procedure(s) were conducted as a shared visit with non-physician practitioner(s) and myself.  I personally evaluated the patient during the encounter    Mancel Bale, MD 11/11/20 2030

## 2020-11-10 NOTE — ED Triage Notes (Signed)
Patient states she lost her balance and fell 5 days ago. patient has a known rib fracture on the right and states she went to her physician already for that.  Patient c/o left side pain and states, "I feel like something is moving in there."

## 2020-11-10 NOTE — Discharge Instructions (Addendum)
Your x-rays today were without any acute abnormalities.  No fractures. Please use MiraLAX twice daily You may use Tylenol 1000 mg every 6 hours for pain.  Patient plenty of water and increase her fiber intake as this is what can improve your constipation.

## 2020-11-10 NOTE — ED Provider Notes (Signed)
[facility]-EMERGENCY DEPT Provider Note   CSN: [id number] Arrival date & time: 11/10/20  1034     History Chief Complaint  Patient presents with  . Fall    Phyllis Knight is a 79 y.o. female.  HPI Patient is an 79 year old female with past medical history significant for A. fib on Eliquis, fibromyalgia, severe arthritis, numbness and tingling of bilateral hands and feet  Patient is presented to the ER today with concerns for left side pain.  She states that it feels achy and sharp in her left side.  She states she woke up with this pain this morning.  She states she did not have this pain yesterday.  She states she feels it is significantly worse with movement bending and lifting and twisting and turning.  She states that she fell 5 days ago onto her right side and fractured a single rib.  She states that she was seen by your primary care doctor for this   She states she is somewhat constipated however she has had 1 bowel movement in the past 24 hours.  This is not abnormal for her.  She denies any urinary symptoms such as frequency, urgency, dysuria, hematuria.  Denies any blood in stools.  Denies any lightheadedness or dizziness.  She states that the fall that she had was a mechanical fall where she lost her balance and fell sideways.  No other associated symptoms.  No aggravating or mitigating factors besides as described above.  She does not take any medications for pain this morning.     Past Medical History:  Diagnosis Date  . Anemia    pt denies  . Arthritis   . Atrial fibrillation (HCC)   . Bradycardia   . Bronchitis    hx of   . Chest pain 06-10-14   had chest pain thia am  . Dizziness   . Fibromyalgia   . 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 GERD (gastroesophageal reflux disease)   . H/O hiatal hernia   . Hearing loss    left ear   . History of uterine cancer 1975   s/p hysterectomy  . Hypertension   . Numbness  and tingling    hands and feet bilat   . Pneumonia yrs ago  . Shortness of breath dyspnea   . Syncope 07/16/2018  . Tinnitus   . Urge urinary incontinence   . Urinary incontinence   . Vitamin D deficiency     Patient Active Problem List   Diagnosis Date Noted  . Monitoring for long-term anticoagulant use 06/30/2020  . Dizziness 04/13/2020  . Mixed hyperlipidemia 04/13/2020  . Leg edema 12/15/2019  . Atypical chest pain 04/28/2019  . Palpitations 04/28/2019  . Influenza 02/14/2019  . Paroxysmal A-fib (HCC) 02/14/2019  . Neutropenia (HCC) 02/14/2019  . Essential hypertension 07/17/2018  . Fibromyalgia 07/17/2018  . Pain in right knee 07/17/2018  . 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 Expected blood loss anemia 08/04/2013  . Obese 08/04/2013  . 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 CERVICAL FUSION  2011  . CHOLECYSTECTOMY N/A 06/14/2014   Procedure: LAPAROSCOPIC CHOLECYSTECTOMY WITH attempted INTRAOPERATIVE CHOLANGIOGRAM;  Surgeon: [name], MD;  Location: [facility];  Service: General;  Laterality: N/A;  . INSERTION OF MESH N/A 07/18/2015   Procedure: INSERTION OF MESH;  Surgeon: Ovidio Kin, MD;  Location: [facility];  Service: General;  Laterality: N/A;  . KNEE CLOSED REDUCTION Left 09/21/2013   Procedure: CLOSED MANIPULATION LEFT KNEE;  Surgeon: Shelda Pal, MD;  Location: [facility];  Service: Orthopedics;  Laterality: Left;  . TOTAL ABDOMINAL HYSTERECTOMY W/ BILATERAL SALPINGOOPHORECTOMY  1972  . TOTAL KNEE ARTHROPLASTY Left 08/03/2013   Procedure: LEFT TOTAL KNEE ARTHROPLASTY;  Surgeon: Shelda Pal, MD;  Location: [facility];  Service: Orthopedics;  Laterality: Left;  . tracheotomy    . TYMPANOPLASTY Left   . VENTRAL HERNIA REPAIR N/A 07/18/2015   Procedure: LAPAROSCOPIC VENTRAL AND UMBILICAL  HERNIA LYSIS OF ADHESIONS;  Surgeon: Ovidio Kin, MD;  Location: [facility];  Service: General;  Laterality: N/A;     OB  History   No obstetric history on file.     Family History  Problem Relation Age of Onset  . Stroke Mother   . Stroke Father     Social History   Tobacco Use  . Smoking status: Never Smoker  . Smokeless tobacco: Never Used  Vaping Use  . Vaping Use: Never used  Substance Use Topics  . Alcohol use: No  . Drug use: No    Home Medications Prior to Admission medications   Medication Sig Start Date End Date Taking? Authorizing Provider  amLODipine (NORVASC) 10 MG tablet Take 1 tablet (10 mg total) by mouth daily. Taking it at night Patient taking differently: Take 10 mg by mouth at bedtime. 10/19/20  Yes Patwardhan, Anabel Bene, MD  apixaban (ELIQUIS) 5 MG TABS tablet Take 5 mg by mouth 2 (two) times daily.   Yes [provider]  chlorpheniramine (CHLOR-TRIMETON) 4 MG tablet Take 4 mg by mouth daily.   Yes [provider]  cholecalciferol (VITAMIN D) 1000 units tablet Take 2,000 Units by mouth daily.    Yes [provider]  furosemide (LASIX) 20 MG tablet Take 1 tablet (20 mg total) by mouth daily. 06/20/20  Yes Patwardhan, Manish J, MD  HYDROcodone-acetaminophen (NORCO/VICODIN) 5-325 MG tablet Take 1 tablet by mouth daily as needed for moderate pain. 11/07/20  Yes [provider]  metoprolol succinate (TOPROL-XL) 50 MG 24 hr tablet TAKE 1 TABLET BY MOUTH  DAILY WITH OR IMMEDIATELY  FOLLOWING A MEAL Patient taking differently: Take 50 mg by mouth daily. 04/18/20  Yes Patwardhan, Manish J, MD  pantoprazole (PROTONIX) 40 MG tablet Take 40 mg by mouth daily.  01/26/19  Yes [provider]  potassium chloride (KLOR-CON) 8 MEQ tablet Take 8 mEq by mouth daily.  04/17/15  Yes [provider]  rosuvastatin (CRESTOR) 10 MG tablet Take 1 tablet (10 mg total) by mouth daily. 04/13/20 10/18/20 Yes Patwardhan, Anabel Bene, MD  traMADol-acetaminophen (ULTRACET) 37.5-325 MG tablet Take 1 tablet by mouth every 6 (six) hours as needed (soft tissue and muscle  pain). 08/04/20  Yes [provider]  nitrofurantoin, macrocrystal-monohydrate, (MACROBID) 100 MG capsule Take 100 mg by mouth 2 (two) times daily. Patient not taking: No sig reported 10/11/20   [provider]  polyethylene glycol powder (MIRALAX) 17 GM/SCOOP powder Take 255 g by mouth once for 1 dose. 11/10/20 11/10/20  Gailen Shelter, PA    Allergies    Codeine and Zofran Frazier Richards hcl]  Review of Systems   Review of Systems  Constitutional: Negative for fever.  HENT: Negative for congestion.   Eyes: Negative for pain.  Respiratory: Negative for cough.   Cardiovascular: Negative for chest pain and leg swelling.  Gastrointestinal: Negative for abdominal pain and vomiting.  Genitourinary: Negative for dysuria.  Musculoskeletal: Negative for myalgias.       Left side pain/body pain  Skin: Negative for rash.  Neurological: Negative for headaches.    Physical Exam Updated Vital Signs BP (!) 154/83 (BP Location: Right Arm)   Pulse (!) 56   Temp 97.9 F (36.6 C) (Oral)   Resp (!) 22   Ht 5\' 6"  (1.676 m)   Wt 96.6 kg   SpO2 100%   BMI 34.38 kg/m   Physical Exam Vitals and nursing note reviewed.  Constitutional:      General: She is not in acute distress.    Comments: 79 year old female appears uncomfortable but in no acute distress sitting in bed able answer questions appropriately follow commands  HENT:     Head: Normocephalic and atraumatic.     Nose: Nose normal.     Mouth/Throat:     Mouth: Mucous membranes are moist.  Eyes:     General: No scleral icterus. Cardiovascular:     Rate and Rhythm: Normal rate and regular rhythm.     Pulses: Normal pulses.     Heart sounds: Normal heart sounds.  Pulmonary:     Effort: Pulmonary effort is normal. No respiratory distress.     Breath sounds: Normal breath sounds. No wheezing.  Abdominal:     Palpations: Abdomen is soft.     Tenderness: There is no abdominal tenderness. There is no guarding or  rebound.     Comments: Abdomen with no tenderness or bruising.  Musculoskeletal:     Cervical back: Normal range of motion.     Right lower leg: No edema.     Left lower leg: No edema.     Comments: TTP of the left lateral ribcage/chest wall TTP of the left superior anterior iliac crest Strength 5/5 bilateral upper and lower extremities with handgrip, flexion extension of the elbow and shoulder in flexion extension of hip and ankle  Skin:    General: Skin is warm and dry.     Capillary Refill: Capillary refill takes less than 2 seconds.     Comments: No bruising of the hip or left chest  Neurological:     Mental Status: She is alert. Mental status is at baseline.  Psychiatric:        Mood and Affect: Mood normal.        Behavior: Behavior normal.     ED Results / Procedures / Treatments   Labs (all labs ordered are listed, but only abnormal results are displayed) Labs Reviewed - No data to display  EKG None  Radiology DG Ribs Unilateral W/Chest Left  Result Date: 11/10/2020 CLINICAL DATA:  Fall.  Pain. EXAM: LEFT RIBS AND CHEST - 3+ VIEW COMPARISON:  Chest x-ray 11/06/2020. FINDINGS: No evidence of displaced fracture or pneumothorax. Degenerative changes scoliosis thoracic spine. Prior cervical spine fusion. Degenerative changes left shoulder. Atelectatic changes both lung bases. IMPRESSION: No evidence of displaced fracture or pneumothorax. Atelectatic changes both lung bases. Electronically Signed   By: Maisie Fushomas  Register   On: 11/10/2020 12:38   DG Hip Unilat W or Wo Pelvis 2-3 Views Left  Result Date: 11/10/2020 CLINICAL DATA:  Larey SeatFell. Left hip pain. EXAM: DG HIP (WITH OR WITHOUT PELVIS) 2-3V LEFT COMPARISON:  None. FINDINGS: Both hips are normally located. No significant degenerative changes. No acute fracture or plain film evidence of AVN. The pubic symphysis and SI joints are intact. No pelvic fractures or bone lesions. IMPRESSION: No acute bony findings.  Electronically  Signed   By: Rudie Meyer M.D.   On: 11/10/2020 12:42    Procedures Procedures (including critical care time)  Medications Ordered in ED Medications  acetaminophen (TYLENOL) tablet 1,000 mg (1,000 mg Oral Given 11/10/20 1237)    ED Course  I have reviewed the triage vital signs and the nursing notes.  Pertinent labs & imaging results that were available during my care of the patient were reviewed by me and considered in my medical decision making (see chart for details).  Patient is 79 year old female presented today with left side pain.  She has no abdominal tenderness and physical exam is overall very reassuring she does have some reproducible tenderness in the left side between her left lateral rib cage and her superior anterior iliac crest.  She also has some tenderness of the left superior anterior iliac crest.  I have low suspicion for fracture given the chronicity of her fall being 5 days before her symptoms began and I have some concern for possible constipation given her long history of constipation, that she is currently on narcotic pain medicine and the lack of other significant findings.  We will obtain x-rays to rule out fracture  Patient did not strike her head or have any symptoms concerning for intercranial hemorrhage.  She is on blood thinner.  I do have low suspicion for intra-abdominal hemorrhage.  She is overall very well-appearing is not tachycardic or hypotensive.  No did she have any bruising or tenderness of the abdomen.  Clinical Course as of 11/10/20 1411  Fri Nov 10, 2020  1248 DG chest without any fracture of the left ribs.  Plain film left hip and pelvis without any acute fracture. [WF]    Clinical Course User Index [WF] Gailen Shelter, [place]   Patient given Tylenol for pain which seems to have improved her pain some.  Will recommend Tylenol at home and gentle stretching warm compresses and ambulation.  MDM Rules/Calculators/A&P                           I discussed this case with my attending physician who cosigned this note including patient's presenting symptoms, physical exam, and planned diagnostics and interventions. Attending physician stated agreement with plan or made changes to plan which were implemented.   Attending physician assessed patient at bedside.  Discharge with MiraLAX to use twice daily.  She will use Tylenol as needed for pain.  Follow-up with primary care doctor.  Given return precautions.  Final Clinical Impression(s) / ED Diagnoses Final diagnoses:  Side pain  Constipation, unspecified constipation type    Rx / DC Orders ED Discharge Orders         Ordered    polyethylene glycol powder (MIRALAX) 17 GM/SCOOP powder   Once        11/10/20 1325           Solon Augusta [place], [place] 11/10/20 1413    Mancel Bale, MD 11/11/20 2030

## 2020-12-15 ENCOUNTER — Other Ambulatory Visit: Payer: Self-pay | Admitting: Cardiology

## 2020-12-15 DIAGNOSIS — E782 Mixed hyperlipidemia: Secondary | ICD-10-CM

## 2021-01-01 DIAGNOSIS — I10 Essential (primary) hypertension: Secondary | ICD-10-CM | POA: Diagnosis not present

## 2021-01-12 ENCOUNTER — Other Ambulatory Visit: Payer: Self-pay

## 2021-01-12 MED ORDER — APIXABAN 5 MG PO TABS: 5.0000 mg | ORAL_TABLET | Freq: Two times a day (BID) | ORAL | 1 refills | Status: DC

## 2021-01-16 DIAGNOSIS — E7800 Pure hypercholesterolemia, unspecified: Secondary | ICD-10-CM | POA: Diagnosis not present

## 2021-01-16 DIAGNOSIS — I10 Essential (primary) hypertension: Secondary | ICD-10-CM | POA: Diagnosis not present

## 2021-01-16 DIAGNOSIS — I4891 Unspecified atrial fibrillation: Secondary | ICD-10-CM | POA: Diagnosis not present

## 2021-01-16 DIAGNOSIS — K219 Gastro-esophageal reflux disease without esophagitis: Secondary | ICD-10-CM | POA: Diagnosis not present

## 2021-01-16 DIAGNOSIS — M479 Spondylosis, unspecified: Secondary | ICD-10-CM | POA: Diagnosis not present

## 2021-01-18 ENCOUNTER — Encounter: Payer: Self-pay | Admitting: Cardiology

## 2021-01-18 ENCOUNTER — Other Ambulatory Visit: Payer: Self-pay

## 2021-01-18 ENCOUNTER — Ambulatory Visit: Payer: Medicare Other | Admitting: Cardiology

## 2021-01-18 VITALS — BP 135/84 | HR 65 | Temp 98.1°F | Resp 16 | Ht 66.0 in | Wt 213.0 lb

## 2021-01-18 DIAGNOSIS — E782 Mixed hyperlipidemia: Secondary | ICD-10-CM

## 2021-01-18 DIAGNOSIS — I10 Essential (primary) hypertension: Secondary | ICD-10-CM

## 2021-01-18 DIAGNOSIS — I480 Paroxysmal atrial fibrillation: Secondary | ICD-10-CM | POA: Diagnosis not present

## 2021-01-18 NOTE — Progress Notes (Signed)
Follow up visit  Subjective:   Phyllis Knight, female    DOB: 01-07-1941, 80 y.o.   MRN: [id number]    HPI   Chief Complaint  Patient presents with  . Atrial Fibrillation  . Follow-up    3 month    80 y.o.African Americanfemalewith hypertension, fibromyalgia, paroxysmal Afib  Reviewed home blood pressure log. Average BP 126/80 HR 61 bpm. She denies chest pain, shortness of breath, palpitations, leg edema, orthopnea, PND, TIA/syncope.  Current Outpatient Medications on File Prior to Visit  Medication Sig Dispense Refill  . amLODipine (NORVASC) 10 MG tablet Take 1 tablet (10 mg total) by mouth daily. Taking it at night (Patient taking differently: Take 10 mg by mouth at bedtime.) 90 tablet 2  . apixaban (ELIQUIS) 5 MG TABS tablet Take 1 tablet (5 mg total) by mouth 2 (two) times daily. 180 tablet 1  . chlorpheniramine (CHLOR-TRIMETON) 4 MG tablet Take 4 mg by mouth daily.    . cholecalciferol (VITAMIN D) 1000 units tablet Take 2,000 Units by mouth daily.     . furosemide (LASIX) 20 MG tablet Take 1 tablet (20 mg total) by mouth daily. 90 tablet 2  . HYDROcodone-acetaminophen (NORCO/VICODIN) 5-325 MG tablet Take 1 tablet by mouth daily as needed for moderate pain.    . metoprolol succinate (TOPROL-XL) 50 MG 24 hr tablet TAKE 1 TABLET BY MOUTH  DAILY WITH OR IMMEDIATELY  FOLLOWING A MEAL (Patient taking differently: Take 50 mg by mouth daily.) 30 tablet 6  . nitrofurantoin, macrocrystal-monohydrate, (MACROBID) 100 MG capsule Take 100 mg by mouth 2 (two) times daily. (Patient not taking: No sig reported)    . pantoprazole (PROTONIX) 40 MG tablet Take 40 mg by mouth daily.     . potassium chloride (KLOR-CON) 8 MEQ tablet Take 8 mEq by mouth daily.     . rosuvastatin (CRESTOR) 10 MG tablet TAKE 1 TABLET BY MOUTH BY MOUTH ONCE DAILY 30 tablet 0  . traMADol-acetaminophen (ULTRACET) 37.5-325 MG tablet Take 1 tablet by mouth every 6 (six) hours as needed (soft tissue and muscle pain).      No current facility-administered medications on file prior to visit.    Cardiovascular & other pertient studies:  EKG 10/18/2020: Sinus rhythm 64 bpm Early R wave profression, consider old posterior infarct  Lexiscan Myoview stress test 06/07/2019: Lexiscan stress test was performed. Stress EKG is non-diagnostic, as this is pharmacological stress test. In addition, Rest and stress EKG reveal sinus rhythm, frequent PVC's and occasional PAC's.  SPECT images revealed small sized, mildly reversible, mild intensity perfusion defect in basal inferior myocardium. While breast attenuation is possible (imaging performed in sitting position), small area of ischemia cannot be excluded. LVEF 73% with normal wall motion. Low risk study.   Event monitor 04/28/2019 - 05/11/2019:  1 auto triggered event shows sinus rhythm. No patient triggered events seen. No Afib.    Echocardiogram 02/16/2019:  1. The left ventricle has normal systolic function with an ejection fraction of 60-65%. The cavity size was normal. There is mild concentric left ventricular hypertrophy. Diastolic function could not be assessed due to atrial fibrillation.  2. Low normal RV systolic function.  3. Mild left atrial dilatation. Moderate right atrial dilatation.  4. Mild to moderate mitral and tricuspid regurgitation. RVSP 29 mmHg.  5. Vavlular regurgitation and chamber dilatation new since previous echocardiogram on 07/17/2018.   Recent labs: 10/11/2020: Glucose 80, BUN/Cr 14/0.6. EGFR 84.  HbA1C N/A Chol 139, TG 93, HDL 49, LDL  72 TSH 2.3 normal  01/12/2020: Glucose 81, BUN/Cr 13/0.66. EGFR 97. Na/K 142/4.5. Rest of the CMP normal Chol 207, TG 119, HDL 47, LDL 139.  02/15/2019: Glucose 211, BUN/Cr 12/0.66. EGFR >60. Na/K 135/3.7. Rest of the CMP normal H/H 12/37. MCV 92. Platelets 216 TSH 0.5 normal.   Review of Systems  Cardiovascular: Negative for chest pain, dyspnea on exertion, leg swelling, palpitations  and syncope.           Vitals:   01/18/21 1120  BP: 135/84  Pulse: 65  Resp: 16  Temp: 98.1 F (36.7 C)  SpO2: 97%     Body mass index is 34.38 kg/m. Filed Weights   01/18/21 1120  Weight: 213 lb (96.6 kg)     Objective:  Physical Exam Vitals and nursing note reviewed.  Constitutional:      General: She is not in acute distress. Neck:     Vascular: No JVD.  Cardiovascular:     Rate and Rhythm: Normal rate and regular rhythm.     Heart sounds: Normal heart sounds. No murmur heard.   Pulmonary:     Effort: Pulmonary effort is normal.     Breath sounds: Normal breath sounds. No wheezing or rales.         Assessment & Recommendations:   80 y.o.African Americanfemalewith hypertension, fibromyalgia, paroxysmal Afib   Hypertension: Controlled.  Paroxysmal Afib:  Has not had any known recurrence of A fib. No A fib seen on event monitor.  CHA2DS2VASc score 4. Annual stroke risk 5%. Continue Eliquis 5 mg twice daily.   Hyperlipidemia: LDL down to 72 on Crestor 10 mg daily  F/u in 1 year  [name], MD [facility] Pager: [phone] Office: [phone] If no answer Cell [phone]

## 2021-02-01 DIAGNOSIS — I10 Essential (primary) hypertension: Secondary | ICD-10-CM | POA: Diagnosis not present

## 2021-02-08 NOTE — Telephone Encounter (Signed)
A user error has taken place: encounter opened in error, closed for administrative reasons.

## 2021-03-03 DIAGNOSIS — I10 Essential (primary) hypertension: Secondary | ICD-10-CM | POA: Diagnosis not present

## 2021-03-21 ENCOUNTER — Other Ambulatory Visit: Payer: Self-pay

## 2021-03-21 ENCOUNTER — Ambulatory Visit: Payer: Medicare Other | Admitting: Cardiology

## 2021-03-21 ENCOUNTER — Encounter: Payer: Self-pay | Admitting: Cardiology

## 2021-03-21 ENCOUNTER — Telehealth: Payer: Self-pay

## 2021-03-21 ENCOUNTER — Other Ambulatory Visit: Payer: Medicare Other

## 2021-03-21 VITALS — BP 136/84 | HR 69 | Temp 97.8°F | Resp 17 | Ht 66.0 in | Wt 214.0 lb

## 2021-03-21 DIAGNOSIS — I480 Paroxysmal atrial fibrillation: Secondary | ICD-10-CM | POA: Diagnosis not present

## 2021-03-21 DIAGNOSIS — I10 Essential (primary) hypertension: Secondary | ICD-10-CM | POA: Diagnosis not present

## 2021-03-21 DIAGNOSIS — R072 Precordial pain: Secondary | ICD-10-CM

## 2021-03-21 NOTE — Telephone Encounter (Signed)
Patient called that she is feeling her "heart beating too fast" she is having chest pain and sob due to that. Patient insisted in wanting to see you today. While on the phone patient's bp was 147/83. I told patient I was going to make you aware but to seek care to the nearest ER via EMS if her symptoms get worse please advise

## 2021-03-21 NOTE — Progress Notes (Signed)
Follow up visit  Subjective:   Phyllis Knight, female    DOB: 19-Jun-1941, 80 y.o.   MRN: [id number]    HPI   Chief Complaint  Patient presents with  . Follow-up  . Chest Pain  . Shortness of Breath    80 y.o.African Americanfemalewith hypertension, fibromyalgia, paroxysmal Afib  Patient made an urgent appt today. She has had chest pain and palpitations symptoms in the last few days. She is poor historian and not able to elaborate more. Denies current chest pain. She appears stressed. On further questioning, husband tells me that she is very stressed with her daughter's health.    Current Outpatient Medications on File Prior to Visit  Medication Sig Dispense Refill  . amLODipine (NORVASC) 10 MG tablet Take 1 tablet (10 mg total) by mouth daily. Taking it at night (Patient taking differently: Take 5 mg by mouth at bedtime.) 90 tablet 2  . apixaban (ELIQUIS) 5 MG TABS tablet Take 1 tablet (5 mg total) by mouth 2 (two) times daily. 180 tablet 1  . chlorpheniramine (CHLOR-TRIMETON) 4 MG tablet Take 4 mg by mouth daily.    . cholecalciferol (VITAMIN D) 1000 units tablet Take 2,000 Units by mouth daily.     . furosemide (LASIX) 20 MG tablet Take 1 tablet (20 mg total) by mouth daily. 90 tablet 2  . HYDROcodone-acetaminophen (NORCO/VICODIN) 5-325 MG tablet Take 1 tablet by mouth daily as needed for moderate pain.    . metoprolol succinate (TOPROL-XL) 50 MG 24 hr tablet TAKE 1 TABLET BY MOUTH  DAILY WITH OR IMMEDIATELY  FOLLOWING A MEAL (Patient taking differently: Take 50 mg by mouth daily.) 30 tablet 6  . nitrofurantoin, macrocrystal-monohydrate, (MACROBID) 100 MG capsule Take 100 mg by mouth 2 (two) times daily.    . pantoprazole (PROTONIX) 40 MG tablet Take 40 mg by mouth daily.     . potassium chloride (KLOR-CON) 8 MEQ tablet Take 8 mEq by mouth daily.     . rosuvastatin (CRESTOR) 10 MG tablet TAKE 1 TABLET BY MOUTH BY MOUTH ONCE DAILY 30 tablet 0  . traMADol-acetaminophen  (ULTRACET) 37.5-325 MG tablet Take 1 tablet by mouth every 6 (six) hours as needed (soft tissue and muscle pain).     No current facility-administered medications on file prior to visit.    Cardiovascular & other pertient studies:  EKG 03/21/2021: Sinus rhythm 62 bpm Possible old anteroseptal infarct  EKG 10/18/2020: Sinus rhythm 64 bpm Early R wave profression, consider old posterior infarct  Lexiscan Myoview stress test 06/07/2019: Lexiscan stress test was performed. Stress EKG is non-diagnostic, as this is pharmacological stress test. In addition, Rest and stress EKG reveal sinus rhythm, frequent PVC's and occasional PAC's.  SPECT images revealed small sized, mildly reversible, mild intensity perfusion defect in basal inferior myocardium. While breast attenuation is possible (imaging performed in sitting position), small area of ischemia cannot be excluded. LVEF 73% with normal wall motion. Low risk study.   Event monitor 04/28/2019 - 05/11/2019:  1 auto triggered event shows sinus rhythm. No patient triggered events seen. No Afib.    Echocardiogram 02/16/2019:  1. The left ventricle has normal systolic function with an ejection fraction of 60-65%. The cavity size was normal. There is mild concentric left ventricular hypertrophy. Diastolic function could not be assessed due to atrial fibrillation.  2. Low normal RV systolic function.  3. Mild left atrial dilatation. Moderate right atrial dilatation.  4. Mild to moderate mitral and tricuspid regurgitation. RVSP 29 mmHg.  5. Vavlular regurgitation and chamber dilatation new since previous echocardiogram on 07/17/2018.   Recent labs: 10/11/2020: Glucose 80, BUN/Cr 14/0.6. EGFR 84.  HbA1C N/A Chol 139, TG 93, HDL 49, LDL 72 TSH 2.3 normal  01/12/2020: Glucose 81, BUN/Cr 13/0.66. EGFR 97. Na/K 142/4.5. Rest of the CMP normal Chol 207, TG 119, HDL 47, LDL 139.  02/15/2019: Glucose 211, BUN/Cr 12/0.66. EGFR >60. Na/K 135/3.7.  Rest of the CMP normal H/H 12/37. MCV 92. Platelets 216 TSH 0.5 normal.   Review of Systems  Cardiovascular: Positive for chest pain and palpitations. Negative for dyspnea on exertion, leg swelling and syncope.          Vitals:   03/21/21 1440 03/21/21 1454  BP: (!) 165/105 136/84  Pulse: 60 69  Resp: 17   Temp: 97.8 F (36.6 C)   SpO2: 98% 97%     Body mass index is 34.54 kg/m. Filed Weights   03/21/21 1440  Weight: 214 lb (97.1 kg)     Objective:  Physical Exam Vitals and nursing note reviewed.  Constitutional:      General: She is not in acute distress. Neck:     Vascular: No JVD.  Cardiovascular:     Rate and Rhythm: Normal rate and regular rhythm.     Heart sounds: Normal heart sounds. No murmur heard.   Pulmonary:     Effort: Pulmonary effort is normal.     Breath sounds: Normal breath sounds. No wheezing or rales.         Assessment & Recommendations:   80 y.o.African Americanfemalewith hypertension, fibromyalgia, paroxysmal Afib  Chest pain, palpitations: EKG today shows sinus rhythm, no acute ischemic changes. Recommend 1 week monitor, check troponin,. She is reluctant to go to ER at any time. I suspect stress is playing a role.   Hypertension: Controlled.  Paroxysmal Afib:  CHA2DS2VASc score 4. Annual stroke risk 5%. Continue Eliquis 5 mg twice daily.   Hyperlipidemia: LDL down to 72 on Crestor 10 mg daily  F/u in 4 weeks  Phyllis Yonke Esther Hardy, MD [facility] Pager: [phone] Office: [phone] If no answer Cell [phone]

## 2021-03-21 NOTE — Telephone Encounter (Signed)
Just noted. If she has not gone to ER, I can see her this afternoon.  Thanks MJP

## 2021-03-22 LAB — TROPONIN T: Troponin T (Highly Sensitive): 6 ng/L (ref 0–14)

## 2021-03-29 DIAGNOSIS — M170 Bilateral primary osteoarthritis of knee: Secondary | ICD-10-CM | POA: Diagnosis not present

## 2021-03-29 DIAGNOSIS — E7800 Pure hypercholesterolemia, unspecified: Secondary | ICD-10-CM | POA: Diagnosis not present

## 2021-03-29 DIAGNOSIS — I10 Essential (primary) hypertension: Secondary | ICD-10-CM | POA: Diagnosis not present

## 2021-03-29 DIAGNOSIS — K219 Gastro-esophageal reflux disease without esophagitis: Secondary | ICD-10-CM | POA: Diagnosis not present

## 2021-03-29 DIAGNOSIS — I4891 Unspecified atrial fibrillation: Secondary | ICD-10-CM | POA: Diagnosis not present

## 2021-03-29 DIAGNOSIS — M479 Spondylosis, unspecified: Secondary | ICD-10-CM | POA: Diagnosis not present

## 2021-04-02 DIAGNOSIS — I10 Essential (primary) hypertension: Secondary | ICD-10-CM | POA: Diagnosis not present

## 2021-04-03 DIAGNOSIS — I480 Paroxysmal atrial fibrillation: Secondary | ICD-10-CM | POA: Diagnosis not present

## 2021-04-03 DIAGNOSIS — I10 Essential (primary) hypertension: Secondary | ICD-10-CM | POA: Diagnosis not present

## 2021-04-09 DIAGNOSIS — I480 Paroxysmal atrial fibrillation: Secondary | ICD-10-CM | POA: Diagnosis not present

## 2021-04-10 DIAGNOSIS — M129 Arthropathy, unspecified: Secondary | ICD-10-CM | POA: Diagnosis not present

## 2021-04-10 DIAGNOSIS — K219 Gastro-esophageal reflux disease without esophagitis: Secondary | ICD-10-CM | POA: Diagnosis not present

## 2021-04-10 DIAGNOSIS — M797 Fibromyalgia: Secondary | ICD-10-CM | POA: Diagnosis not present

## 2021-04-10 DIAGNOSIS — E7800 Pure hypercholesterolemia, unspecified: Secondary | ICD-10-CM | POA: Diagnosis not present

## 2021-04-10 DIAGNOSIS — I10 Essential (primary) hypertension: Secondary | ICD-10-CM | POA: Diagnosis not present

## 2021-04-10 DIAGNOSIS — R413 Other amnesia: Secondary | ICD-10-CM | POA: Diagnosis not present

## 2021-04-10 DIAGNOSIS — D6869 Other thrombophilia: Secondary | ICD-10-CM | POA: Diagnosis not present

## 2021-04-10 DIAGNOSIS — I4891 Unspecified atrial fibrillation: Secondary | ICD-10-CM | POA: Diagnosis not present

## 2021-04-12 ENCOUNTER — Other Ambulatory Visit: Payer: Self-pay | Admitting: Cardiology

## 2021-04-16 ENCOUNTER — Other Ambulatory Visit: Payer: Self-pay | Admitting: Pharmacist

## 2021-04-16 DIAGNOSIS — Z7901 Long term (current) use of anticoagulants: Secondary | ICD-10-CM

## 2021-04-16 DIAGNOSIS — Z5181 Encounter for therapeutic drug level monitoring: Secondary | ICD-10-CM

## 2021-04-16 DIAGNOSIS — I480 Paroxysmal atrial fibrillation: Secondary | ICD-10-CM

## 2021-04-16 MED ORDER — ELIQUIS 5 MG PO TABS: 1.0000 | ORAL_TABLET | Freq: Two times a day (BID) | ORAL | 2 refills | Status: DC

## 2021-04-26 ENCOUNTER — Ambulatory Visit: Payer: Medicare Other | Admitting: Cardiology

## 2021-04-26 NOTE — Progress Notes (Signed)
Follow up visit  Subjective:   Phyllis Knight, female    DOB: Mar 31, 1941, 80 y.o.   MRN: [id number]    HPI   Chief Complaint  Patient presents with  . Chest Pain  . Atrial Fibrillation  . Follow-up    4 week    80 y.o.African Americanfemalewith hypertension, fibromyalgia, paroxysmal Afib  Patient presents for 4-week follow-up of chest pain and palpitations.  Last visit suspected symptoms were likely due to underlying stress. Troponin following last visit was negative. 1 week cardiac telemetry reviewed episodes of SVT as well as PVCs and PACs, no atrial fibrillation/flutter, SVT, high-grade AV block, sinus pauses >3 seconds.   Patient reports chest pain and palpitations have significantly improved since last visit. No active chest pain, although she does continue to have very occasional episodes of brief chest discomfort.   Current Outpatient Medications on File Prior to Visit  Medication Sig Dispense Refill  . amLODipine (NORVASC) 10 MG tablet Take 1 tablet (10 mg total) by mouth daily. Taking it at night 90 tablet 2  . apixaban (ELIQUIS) 5 MG TABS tablet Take 1 tablet (5 mg total) by mouth 2 (two) times daily. 180 tablet 2  . chlorpheniramine (CHLOR-TRIMETON) 4 MG tablet Take 4 mg by mouth 2 (two) times daily as needed for allergies.    . cholecalciferol (VITAMIN D) 1000 units tablet Take 2,000 Units by mouth daily.     . furosemide (LASIX) 20 MG tablet Take 1 tablet (20 mg total) by mouth daily. (Patient taking differently: Take 0.5 mg by mouth daily.) 90 tablet 2  . metoprolol succinate (TOPROL-XL) 50 MG 24 hr tablet TAKE 1 TABLET BY MOUTH  DAILY WITH OR IMMEDIATELY  FOLLOWING A MEAL (Patient taking differently: Take 50 mg by mouth daily.) 30 tablet 6  . nitrofurantoin, macrocrystal-monohydrate, (MACROBID) 100 MG capsule Take 100 mg by mouth 2 (two) times daily.    . pantoprazole (PROTONIX) 40 MG tablet Take 40 mg by mouth daily.     . potassium chloride (KLOR-CON) 8 MEQ  tablet Take 8 mEq by mouth daily.     . rosuvastatin (CRESTOR) 10 MG tablet TAKE 1 TABLET BY MOUTH BY MOUTH ONCE DAILY 30 tablet 0  . traMADol-acetaminophen (ULTRACET) 37.5-325 MG tablet Take 1 tablet by mouth every 6 (six) hours as needed (soft tissue and muscle pain).     No current facility-administered medications on file prior to visit.    Cardiovascular & other pertient studies:  Mobile cardiac telemetry 7 days 03/21/2021 - 03/29/2021: Dominant rhythm: Sinus. HR 43-90 bpm. Avg HR 61 bpm, in sinus rhythm. 282 episodes of SVT, fastest at 164 bpm for 6 beats, longest for 17.4 secs at 112 bpm. <1% isolated SVE, couplet/triplets. <1% isolated VE, couplets. No atrial fibrillation/atrial flutter/VT/high grade AV block, sinus pause >3sec noted. 0 patient triggered events.   EKG 03/21/2021: Sinus rhythm 62 bpm Possible old anteroseptal infarct  EKG 10/18/2020: Sinus rhythm 64 bpm Early R wave profression, consider old posterior infarct  Lexiscan Myoview stress test 06/07/2019: Lexiscan stress test was performed. Stress EKG is non-diagnostic, as this is pharmacological stress test. In addition, Rest and stress EKG reveal sinus rhythm, frequent PVC's and occasional PAC's.  SPECT images revealed small sized, mildly reversible, mild intensity perfusion defect in basal inferior myocardium. While breast attenuation is possible (imaging performed in sitting position), small area of ischemia cannot be excluded. LVEF 73% with normal wall motion. Low risk study.   Event monitor 04/28/2019 - 05/11/2019:  1 auto triggered event shows sinus rhythm. No patient triggered events seen. No Afib.    Echocardiogram 02/16/2019:  1. The left ventricle has normal systolic function with an ejection fraction of 60-65%. The cavity size was normal. There is mild concentric left ventricular hypertrophy. Diastolic function could not be assessed due to atrial fibrillation.  2. Low normal RV systolic function.  3.  Mild left atrial dilatation. Moderate right atrial dilatation.  4. Mild to moderate mitral and tricuspid regurgitation. RVSP 29 mmHg.  5. Vavlular regurgitation and chamber dilatation new since previous echocardiogram on 07/17/2018.   Recent labs: 10/11/2020: Glucose 80, BUN/Cr 14/0.6. EGFR 84.  HbA1C N/A Chol 139, TG 93, HDL 49, LDL 72 TSH 2.3 normal  01/12/2020: Glucose 81, BUN/Cr 13/0.66. EGFR 97. Na/K 142/4.5. Rest of the CMP normal Chol 207, TG 119, HDL 47, LDL 139.  02/15/2019: Glucose 211, BUN/Cr 12/0.66. EGFR >60. Na/K 135/3.7. Rest of the CMP normal H/H 12/37. MCV 92. Platelets 216 TSH 0.5 normal.   Review of Systems  Constitutional: Negative for malaise/fatigue and weight gain.  Cardiovascular: Positive for chest pain (improved). Negative for claudication, dyspnea on exertion, leg swelling, near-syncope, orthopnea, palpitations (resolved), paroxysmal nocturnal dyspnea and syncope.  Respiratory: Negative for shortness of breath.   Hematologic/Lymphatic: Does not bruise/bleed easily.  Gastrointestinal: Negative for melena.  Neurological: Negative for dizziness and weakness.          Vitals:   04/27/21 1049 04/27/21 1055  BP: (!) 149/95 (!) 148/95  Pulse: (!) 56 (!) 58  Resp: 16   Temp: 97.8 F (36.6 C)   SpO2: 97%      Body mass index is 33.89 kg/m. Filed Weights   04/27/21 1049  Weight: 210 lb (95.3 kg)     Objective:  Physical Exam Vitals and nursing note reviewed.  Constitutional:      General: She is not in acute distress. HENT:     Head: Normocephalic and atraumatic.  Neck:     Vascular: No JVD.  Cardiovascular:     Rate and Rhythm: Normal rate and regular rhythm.     Pulses: Intact distal pulses.     Heart sounds: Normal heart sounds, S1 normal and S2 normal. No murmur heard. No gallop.   Pulmonary:     Effort: Pulmonary effort is normal. No respiratory distress.     Breath sounds: Normal breath sounds. No wheezing, rhonchi or rales.   Musculoskeletal:     Right lower leg: No edema.     Left lower leg: No edema.  Neurological:     Mental Status: She is alert.         Assessment & Recommendations:   80 y.o.African Americanfemalewith hypertension, fibromyalgia, paroxysmal Afib  Chest pain, palpitations: Chest pain has significantly improved, visual symptoms suggestive of noncardiac chest discomfort. Troponin following last visit was negative, therefore reassured patient. Palpitations have also essentially resolved. Will continue to monitor and follow closely.  Hypertension: Controlled.  Paroxysmal Afib:  CHA2DS2VASc score 4. Annual stroke risk 5%. Continue Eliquis 5 mg twice daily.   Hyperlipidemia: LDL down to 72 on Crestor 10 mg daily  Follow up as previously scheduled with Dr. Virgina Jock in Feb. 2023, sooner if needed.    Alethia Berthold, PA-C 04/27/2021, 2:24 PM Office: [phone]

## 2021-04-27 ENCOUNTER — Encounter: Payer: Self-pay | Admitting: Student

## 2021-04-27 ENCOUNTER — Ambulatory Visit: Payer: Medicare Other | Admitting: Student

## 2021-04-27 ENCOUNTER — Other Ambulatory Visit: Payer: Self-pay

## 2021-04-27 VITALS — BP 148/95 | HR 58 | Temp 97.8°F | Resp 16 | Ht 66.0 in | Wt 210.0 lb

## 2021-04-27 DIAGNOSIS — I10 Essential (primary) hypertension: Secondary | ICD-10-CM | POA: Diagnosis not present

## 2021-04-27 DIAGNOSIS — R072 Precordial pain: Secondary | ICD-10-CM

## 2021-04-27 DIAGNOSIS — R002 Palpitations: Secondary | ICD-10-CM

## 2021-04-27 DIAGNOSIS — I480 Paroxysmal atrial fibrillation: Secondary | ICD-10-CM | POA: Diagnosis not present

## 2021-05-02 DIAGNOSIS — I10 Essential (primary) hypertension: Secondary | ICD-10-CM | POA: Diagnosis not present

## 2021-05-05 DIAGNOSIS — I10 Essential (primary) hypertension: Secondary | ICD-10-CM | POA: Diagnosis not present

## 2021-05-09 DIAGNOSIS — Z20822 Contact with and (suspected) exposure to covid-19: Secondary | ICD-10-CM | POA: Diagnosis not present

## 2021-05-09 DIAGNOSIS — R058 Other specified cough: Secondary | ICD-10-CM | POA: Diagnosis not present

## 2021-05-31 DIAGNOSIS — I10 Essential (primary) hypertension: Secondary | ICD-10-CM | POA: Diagnosis not present

## 2021-06-05 DIAGNOSIS — I10 Essential (primary) hypertension: Secondary | ICD-10-CM | POA: Diagnosis not present

## 2021-06-14 DIAGNOSIS — M479 Spondylosis, unspecified: Secondary | ICD-10-CM | POA: Diagnosis not present

## 2021-06-14 DIAGNOSIS — K219 Gastro-esophageal reflux disease without esophagitis: Secondary | ICD-10-CM | POA: Diagnosis not present

## 2021-06-14 DIAGNOSIS — E7800 Pure hypercholesterolemia, unspecified: Secondary | ICD-10-CM | POA: Diagnosis not present

## 2021-06-14 DIAGNOSIS — I4891 Unspecified atrial fibrillation: Secondary | ICD-10-CM | POA: Diagnosis not present

## 2021-06-14 DIAGNOSIS — I10 Essential (primary) hypertension: Secondary | ICD-10-CM | POA: Diagnosis not present

## 2021-06-14 DIAGNOSIS — M170 Bilateral primary osteoarthritis of knee: Secondary | ICD-10-CM | POA: Diagnosis not present

## 2021-06-21 DIAGNOSIS — R413 Other amnesia: Secondary | ICD-10-CM | POA: Diagnosis not present

## 2021-06-21 DIAGNOSIS — R21 Rash and other nonspecific skin eruption: Secondary | ICD-10-CM | POA: Diagnosis not present

## 2021-06-22 ENCOUNTER — Other Ambulatory Visit: Payer: Self-pay | Admitting: Family Medicine

## 2021-06-22 DIAGNOSIS — R413 Other amnesia: Secondary | ICD-10-CM

## 2021-06-22 DIAGNOSIS — R443 Hallucinations, unspecified: Secondary | ICD-10-CM

## 2021-07-03 ENCOUNTER — Telehealth: Payer: Self-pay | Admitting: Pharmacist

## 2021-07-03 NOTE — Telephone Encounter (Signed)
Called and discussed with pt. Pt reports that she recently went to her PCP office regarding bilateral redness on her feet. Denies any pain or significant bruising. Was told it might be due to Eliquis. Reviewed with pt that pt might be more prone to bruising and bleeding with being on  Eliquis. As long as the symptoms aren't severe or bothersome, the recommendation would be to continue Eliquis as it is. Pt also reports that she recently got a new refill from her pharmacy for amlodipine 10 mg to be taken 1 whole tablet daily. Pt was previously stable and controlled on 5 mg daily. BP readings have trended down over the last week. Pt reports that she feels fatigued and dizzy. Encouraged pt  to increase her fluid intake and to decrease her amlodipine dose to 5 mg daily.

## 2021-07-06 DIAGNOSIS — I10 Essential (primary) hypertension: Secondary | ICD-10-CM | POA: Diagnosis not present

## 2021-07-09 ENCOUNTER — Other Ambulatory Visit: Payer: Self-pay

## 2021-07-09 ENCOUNTER — Ambulatory Visit
Admission: RE | Admit: 2021-07-09 | Discharge: 2021-07-09 | Disposition: A | Discharge status: Home / Self Care | Payer: Medicare Other | Source: Ambulatory Visit | Attending: Family Medicine | Admitting: Family Medicine

## 2021-07-09 DIAGNOSIS — R413 Other amnesia: Secondary | ICD-10-CM

## 2021-07-09 DIAGNOSIS — G319 Degenerative disease of nervous system, unspecified: Secondary | ICD-10-CM | POA: Diagnosis not present

## 2021-07-09 DIAGNOSIS — R443 Hallucinations, unspecified: Secondary | ICD-10-CM

## 2021-07-09 DIAGNOSIS — I6782 Cerebral ischemia: Secondary | ICD-10-CM | POA: Diagnosis not present

## 2021-07-23 ENCOUNTER — Encounter: Payer: Medicare Other | Admitting: Neurology

## 2021-08-06 DIAGNOSIS — I10 Essential (primary) hypertension: Secondary | ICD-10-CM | POA: Diagnosis not present

## 2021-08-17 ENCOUNTER — Other Ambulatory Visit: Payer: Self-pay | Admitting: Cardiology

## 2021-08-17 DIAGNOSIS — I10 Essential (primary) hypertension: Secondary | ICD-10-CM

## 2021-09-05 DIAGNOSIS — I10 Essential (primary) hypertension: Secondary | ICD-10-CM | POA: Diagnosis not present

## 2021-09-11 NOTE — Progress Notes (Signed)
Follow up visit  Subjective:   Phyllis Knight, female    DOB: 08-01-1941, 80 y.o.   MRN: [id number]    HPI   Chief Complaint  Patient presents with   Precordial pain   Paroxysmal A-fib   Follow-up    80 y.o. African American female  with hypertension, fibromyalgia, paroxysmal Afib  Home avg BP 126/79 mmHg. patient has noticed episodes of palpitations recently.  One episode lasted for a few minutes.  She does not have any other associated symptoms with it.   Current Outpatient Medications on File Prior to Visit  Medication Sig Dispense Refill   amLODipine (NORVASC) 10 MG tablet TAKE 1 TABLET BY MOUTH  DAILY AT NIGHT 90 tablet 3   apixaban (ELIQUIS) 5 MG TABS tablet Take 1 tablet (5 mg total) by mouth 2 (two) times daily. 180 tablet 2   chlorpheniramine (CHLOR-TRIMETON) 4 MG tablet Take 4 mg by mouth 2 (two) times daily as needed for allergies.     cholecalciferol (VITAMIN D) 1000 units tablet Take 2,000 Units by mouth daily.      furosemide (LASIX) 20 MG tablet Take 1 tablet (20 mg total) by mouth daily. (Patient taking differently: Take 0.5 mg by mouth daily.) 90 tablet 2   metoprolol succinate (TOPROL-XL) 50 MG 24 hr tablet TAKE 1 TABLET BY MOUTH  DAILY WITH OR IMMEDIATELY  FOLLOWING A MEAL (Patient taking differently: Take 50 mg by mouth daily.) 30 tablet 6   pantoprazole (PROTONIX) 40 MG tablet Take 40 mg by mouth daily.      potassium chloride (KLOR-CON) 8 MEQ tablet Take 8 mEq by mouth daily.      rosuvastatin (CRESTOR) 10 MG tablet TAKE 1 TABLET BY MOUTH BY MOUTH ONCE DAILY 30 tablet 0   traMADol-acetaminophen (ULTRACET) 37.5-325 MG tablet Take 1 tablet by mouth every 6 (six) hours as needed (soft tissue and muscle pain).     No current facility-administered medications on file prior to visit.    Cardiovascular & other pertient studies:  EKG 09/12/2021: Sinus rhythm 61 bpm Frequent ectopic ventricular beats   Mobile cardiac telemetry 7 days 03/21/2021 -  03/29/2021: Dominant rhythm: Sinus. HR 43-90 bpm. Avg HR 61 bpm, in sinus rhythm. 282 episodes of SVT, fastest at 164 bpm for 6 beats, longest for 17.4 secs at 112 bpm. <1% isolated SVE, couplet/triplets. <1% isolated VE, couplets. No atrial fibrillation/atrial flutter/VT/high grade AV block, sinus pause >3sec noted. 0 patient triggered events.   EKG 03/21/2021: Sinus rhythm 62 bpm Possible old anteroseptal infarct  EKG 10/18/2020: Sinus rhythm 64 bpm Early R wave profression, consider old posterior infarct  Lexiscan Myoview stress test 06/07/2019: Lexiscan stress test was performed. Stress EKG is non-diagnostic, as this is pharmacological stress test. In addition, Rest and stress EKG reveal sinus rhythm, frequent PVC's and occasional PAC's.  SPECT images revealed small sized, mildly reversible, mild intensity perfusion defect in basal inferior myocardium. While breast attenuation is possible (imaging performed in sitting position), small area of ischemia cannot be excluded. LVEF 73% with normal wall motion. Low risk study.   Event monitor 04/28/2019 - 05/11/2019:  1 auto triggered event shows sinus rhythm. No patient triggered events seen. No Afib.    Echocardiogram 02/16/2019:  1. The left ventricle has normal systolic function with an ejection fraction of 60-65%. The cavity size was normal. There is mild concentric left ventricular hypertrophy. Diastolic function could not be assessed due to atrial fibrillation.  2. Low normal RV systolic function.  3. Mild left atrial dilatation. Moderate right atrial dilatation.  4. Mild to moderate mitral and tricuspid regurgitation. RVSP 29 mmHg.  5. Vavlular regurgitation and chamber dilatation new since previous echocardiogram on 07/17/2018.   Recent labs: 10/11/2020: Glucose 80, BUN/Cr 14/0.6. EGFR 84.  HbA1C N/A Chol 139, TG 93, HDL 49, LDL 72 TSH 2.3 normal  01/12/2020: Glucose 81, BUN/Cr 13/0.66. EGFR 97. Na/K 142/4.5. Rest of the  CMP normal Chol 207, TG 119, HDL 47, LDL 139.  02/15/2019: Glucose 211, BUN/Cr 12/0.66. EGFR >60. Na/K 135/3.7. Rest of the CMP normal H/H 12/37. MCV 92. Platelets 216 TSH 0.5 normal.   Review of Systems  Constitutional: Negative for malaise/fatigue and weight gain.  Cardiovascular:  Positive for chest pain (improved). Negative for claudication, dyspnea on exertion, leg swelling, near-syncope, orthopnea, palpitations (resolved), paroxysmal nocturnal dyspnea and syncope.  Respiratory:  Negative for shortness of breath.   Hematologic/Lymphatic: Does not bruise/bleed easily.  Gastrointestinal:  Negative for melena.  Neurological:  Negative for dizziness and weakness.         Vitals:   09/12/21 1001  BP: (!) 161/94  Pulse: (!) 59  Resp: 16  Temp: 98 F (36.7 C)  SpO2: 98%     Body mass index is 30.13 kg/m. Filed Weights   09/12/21 1001  Weight: 210 lb (95.3 kg)     Objective:  Physical Exam Vitals and nursing note reviewed.  Constitutional:      General: She is not in acute distress. HENT:     Head: Normocephalic and atraumatic.  Neck:     Vascular: No JVD.  Cardiovascular:     Rate and Rhythm: Normal rate and regular rhythm.     Pulses: Intact distal pulses.     Heart sounds: Normal heart sounds, S1 normal and S2 normal. No murmur heard.   No gallop.  Pulmonary:     Effort: Pulmonary effort is normal. No respiratory distress.     Breath sounds: Normal breath sounds. No wheezing, rhonchi or rales.  Musculoskeletal:     Right lower leg: No edema.     Left lower leg: No edema.  Neurological:     Mental Status: She is alert.        Assessment & Recommendations:   80 y.o. African American female  with hypertension, fibromyalgia, paroxysmal Afib, PSVT, PVC  Palpitations: To PVCs on EKG today.  However, her symptoms are more consistent with episodes of PSVT.  Discussed vagal maneuvers with the patient.  Continue metoprolol succinate 50 mg daily.   Additionally, prescribed diltiazem 30 mg for as needed use.  She is currently on amlodipine, but I do not expect her to be taking diltiazem every day.  Therefore, occasional as needed use of diltiazem is okay.  Hypertension: Controlled.  Paroxysmal Afib:  CHA2DS2VASc score 4. Annual stroke risk 5%. Continue Eliquis 5 mg twice daily.   Hyperlipidemia: LDL down to 72 on Crestor 10 mg daily in 10/2020 Follow-up lipid panels with PCP.  Follow-up with me in 3 [address] PA-C 09/11/2021, 10:04 PM Office: [phone]

## 2021-09-12 ENCOUNTER — Encounter: Payer: Self-pay | Admitting: Cardiology

## 2021-09-12 ENCOUNTER — Other Ambulatory Visit: Payer: Self-pay

## 2021-09-12 ENCOUNTER — Ambulatory Visit: Payer: Medicare Other | Admitting: Cardiology

## 2021-09-12 VITALS — BP 161/94 | HR 59 | Temp 98.0°F | Resp 16 | Ht 70.0 in | Wt 210.0 lb

## 2021-09-12 DIAGNOSIS — I471 Supraventricular tachycardia: Secondary | ICD-10-CM | POA: Diagnosis not present

## 2021-09-12 DIAGNOSIS — I493 Ventricular premature depolarization: Secondary | ICD-10-CM | POA: Diagnosis not present

## 2021-09-12 DIAGNOSIS — R072 Precordial pain: Secondary | ICD-10-CM

## 2021-09-12 MED ORDER — DILTIAZEM HCL 30 MG PO TABS: 30.0000 mg | ORAL_TABLET | Freq: Three times a day (TID) | ORAL | 3 refills | Status: DC | PRN

## 2021-10-01 ENCOUNTER — Encounter: Payer: Self-pay | Admitting: Pharmacist

## 2021-10-01 NOTE — Progress Notes (Signed)
CARE PLAN ENTRY  10/01/2021 Name: Phyllis Knight MRN: [id number] DOB: 1941/11/01  [name] is enrolled in Remote Patient Monitoring/Principle Care Monitoring.  Date of Enrollment: 04/13/20 Supervising physician: Truett Mainland Indication: HTN  Remote Readings: Compliant and Avg BP: 130/81, HR:64  Next scheduled OV: 01/18/21  Pharmacist Clinical Goal(s):  Over the next 90 days, patient will demonstrate Improved medication adherence as evidenced by medication fill history Over the next 90 days, patient will demonstrate improved understanding of prescribed medications and rationale for usage as evidenced by patient teach back Over the next 90 days, patient will experience decrease in ED visits. ED visits in last 6 months = 0 Over the next 90 days, patient will not experience hospital admission. Hospital Admissions in last 6 months = 0  Interventions: Provider and Inter-disciplinary care team collaboration (see longitudinal plan of care) Comprehensive medication review performed. Discussed plans with patient for ongoing care management follow up and provided patient with direct contact information for care management team Collaboration with provider re: medication management  Patient Self Care Activities:  Self administers medications as prescribed Attends all scheduled provider appointments Performs ADL's independently Performs IADL's independently  Allergies  Allergen Reactions   Codeine Nausea And Vomiting   Zofran [Ondansetron Hcl] Rash   Outpatient Encounter Medications as of 10/01/2021  Medication Sig   amLODipine (NORVASC) 10 MG tablet TAKE 1 TABLET BY MOUTH  DAILY AT NIGHT (Patient taking differently: 5 mg.)   apixaban (ELIQUIS) 5 MG TABS tablet Take 1 tablet (5 mg total) by mouth 2 (two) times daily.   chlorpheniramine (CHLOR-TRIMETON) 4 MG tablet Take 4 mg by mouth 2 (two) times daily as needed for allergies.   cholecalciferol (VITAMIN D) 1000 units  tablet Take 2,000 Units by mouth daily.    diltiazem (CARDIZEM) 30 MG tablet Take 1 tablet (30 mg total) by mouth 3 (three) times daily as needed.   furosemide (LASIX) 20 MG tablet Take 1 tablet (20 mg total) by mouth daily. (Patient taking differently: Take 0.5 mg by mouth daily.)   metoprolol succinate (TOPROL-XL) 50 MG 24 hr tablet TAKE 1 TABLET BY MOUTH  DAILY WITH OR IMMEDIATELY  FOLLOWING A MEAL (Patient taking differently: Take 50 mg by mouth daily.)   pantoprazole (PROTONIX) 40 MG tablet Take 40 mg by mouth daily.    potassium chloride (KLOR-CON) 8 MEQ tablet Take 8 mEq by mouth daily.    rosuvastatin (CRESTOR) 10 MG tablet TAKE 1 TABLET BY MOUTH BY MOUTH ONCE DAILY   traMADol-acetaminophen (ULTRACET) 37.5-325 MG tablet Take 1 tablet by mouth every 6 (six) hours as needed (soft tissue and muscle pain).   No facility-administered encounter medications on file as of 10/01/2021.    Hypertension   BP goal is:  <130/80  Office blood pressures are  BP Readings from Last 3 Encounters:  09/12/21 (!) 161/94  04/27/21 (!) 148/95  03/21/21 136/84    Patient is currently controlled on the following medications: Diltiazem 30 mg PRN, amlodipine 10 mg, lasix 30 mg, metoprolol 50 mg  Patient checks BP at home daily  Patient home BP readings are ranging: 102-158/66-101  We discussed diet and exercise extensively  Plan  Continue current medications and control with diet and exercise  ______________ Visit Information SDOH (Social Determinants of Health) assessments performed: Yes.  Ms. Reddy was given information about Principle Care Management/Remote Patient Monitoring services today including:  RPM/PCM service includes personalized support from designated clinical staff supervised by her physician, including individualized plan  of care and coordination with other care providers 24/7 contact phone numbers for assistance for urgent and routine care needs. Standard insurance,  coinsurance, copays and deductibles apply for principle care management only during months in which we provide at least 30 minutes of these services. Most insurances cover these services at 100%, however patients may be responsible for any copay, coinsurance and/or deductible if applicable. This service may help you avoid the need for more expensive face-to-face services. Only one practitioner may furnish and bill the service in a calendar month. The patient may stop PCM/RPM services at any time (effective at the end of the month) by phone call to the office staff.  Patient agreed to services and verbal consent obtained.   Cassell Clement, Pharm.D. Clinical Pharmacist [facility] [phone] Ext: [phone]

## 2021-10-01 NOTE — Progress Notes (Signed)
This encounter was created in error - please disregard.

## 2021-10-06 DIAGNOSIS — I10 Essential (primary) hypertension: Secondary | ICD-10-CM | POA: Diagnosis not present

## 2021-10-08 ENCOUNTER — Encounter: Payer: Self-pay | Admitting: Student

## 2021-10-09 ENCOUNTER — Other Ambulatory Visit: Payer: Self-pay | Admitting: Pharmacist

## 2021-10-09 ENCOUNTER — Telehealth: Payer: Self-pay | Admitting: Student

## 2021-10-09 DIAGNOSIS — I10 Essential (primary) hypertension: Secondary | ICD-10-CM | POA: Diagnosis not present

## 2021-10-09 DIAGNOSIS — I480 Paroxysmal atrial fibrillation: Secondary | ICD-10-CM

## 2021-10-09 NOTE — Telephone Encounter (Signed)
ON-CALL CARDIOLOGY 10/08/2021  Patient's name: Phyllis Knight.   MRN: [id number].    DOB: [birth year] Primary care provider: Merri Brunette, MD. Primary cardiologist: Dr. Elveria Rising regarding this patient's care today: Patient called with concerns that her blood pressure was elevated at 189/156 mmHg.  Patient denies symptoms.  Denies chest pain, vision changes, muscle weakness, slurred speech.  Overall patient is without symptoms suggestive of CVA.  Patient to recheck her blood pressure, however she was unable to do this at the time of our call because she needs help with her blood pressure cuff.  I therefore called patient back 30 minutes later and her blood pressure at that time was 167/106 mmHg.  Patient again denied symptoms suggestive of CVA.  Patient typically takes amlodipine 5 mg in the evening, which she had not taken yet.   Therefore advised patient to take 10 mg of amlodipine tonight and continue to monitor blood pressure.  She may return to taking amlodipine 5 mg daily tomorrow.  Patient then called back approximately 2 hours after taking amlodipine with reported blood pressure of 115/74 mmHg.  I attempted to return patient's call, however was unable to reach her.  Therefore left a voicemail message stating that that blood pressure of 115/74 mmHg is acceptable as long as patient is feeling well without dizziness, lightheadedness, shortness of breath, other signs or symptoms of symptomatic hypotension.  In the voicemail I asked that patient call on-call service back if she is having any symptoms or questions.  I then attempted to call patient 10/09/2021 to check in, however she was unavailable.  I therefore left a voicemail asking that she call the office back to touch base about blood pressure this morning.  Impression:   ICD-10-CM   1. Essential hypertension  I10       No orders of the defined types were placed in this encounter.   No orders of the defined types were  placed in this encounter.  Telephone encounter total time: 15 minutes     Rayford Halsted, PA-C 10/09/2021, 8:54 AM Office: [phone]

## 2021-10-15 ENCOUNTER — Telehealth: Payer: Self-pay | Admitting: Student

## 2021-10-15 DIAGNOSIS — I10 Essential (primary) hypertension: Secondary | ICD-10-CM | POA: Diagnosis not present

## 2021-10-15 DIAGNOSIS — R072 Precordial pain: Secondary | ICD-10-CM | POA: Diagnosis not present

## 2021-10-15 NOTE — Telephone Encounter (Signed)
ON-CALL CARDIOLOGY 10/14/2021  Patient's name: Phyllis Knight.   MRN: [id number].    DOB: [birth year] Primary care provider: Merri Brunette, MD. Primary cardiologist: Dr. Elveria Rising regarding this patient's care today: Patient called complaining of chest pain intermittent over the last few hours at rest and lasting <5 minutes.  Patient's systolic blood pressure reportedly 170 mmHg.  Patient has not taken any of her morning medications including metoprolol, amlodipine.  Impression:   ICD-10-CM   1. Essential hypertension  I10     2. Precordial pain  R07.2       No orders of the defined types were placed in this encounter.   No orders of the defined types were placed in this encounter.   Recommendations: Suspect patient's chest discomfort is related to elevated blood pressure.  Advised patient to take her morning medications and monitor symptoms.  If symptoms worsen or fail to improve in the next 1 hour advised patient to call back or go to the emergency department for further evaluation.  Counseled patient regarding signs and symptoms that would warrant urgent/emergent evaluation, she verbalized understanding agreement.   Telephone encounter total time: 10 minutes     Rayford Halsted, PA-C 10/15/2021, 9:32 AM Office: [phone]

## 2021-10-28 ENCOUNTER — Telehealth: Payer: Self-pay | Admitting: Student

## 2021-10-28 ENCOUNTER — Encounter: Payer: Self-pay | Admitting: Student

## 2021-10-28 DIAGNOSIS — I10 Essential (primary) hypertension: Secondary | ICD-10-CM | POA: Diagnosis not present

## 2021-10-28 NOTE — Telephone Encounter (Signed)
ON-CALL CARDIOLOGY 10/28/21  Patient's name: Phyllis Knight.   MRN: [id number].    DOB: Feb 20, 1941 Primary care provider: Merri Brunette, MD. Primary cardiologist: Dr. Elveria Rising regarding this patient's care today: Patient called concerned about low blood pressure of 98/56 mmHg. She denies symptoms of hypotension. Denies shortness of breath, dizziness, syncope, near-syncope, fatigue. Patient typically takes amlodipine at bedtime.   Impression:   ICD-10-CM   1. Essential hypertension  I10       No orders of the defined types were placed in this encounter.   No orders of the defined types were placed in this encounter.   Recommendations: Advised patient to skip tonight's dose of amlodipine. Recommend patient check blood pressure in the morning prior to taking her antihypertensive medications advised her regarding holding parameters. Patient will hold antihypertensive medications if systolic blood pressure <100 mmHg, otherwise she will take medications as prescribed.   Telephone encounter total time: 8 minutes     Rayford Halsted, PA-C 10/28/2021, 8:17 PM Office: [phone]

## 2021-11-05 DIAGNOSIS — I10 Essential (primary) hypertension: Secondary | ICD-10-CM | POA: Diagnosis not present

## 2021-11-07 DIAGNOSIS — I10 Essential (primary) hypertension: Secondary | ICD-10-CM | POA: Diagnosis not present

## 2021-11-07 DIAGNOSIS — E7800 Pure hypercholesterolemia, unspecified: Secondary | ICD-10-CM | POA: Diagnosis not present

## 2021-11-07 DIAGNOSIS — M797 Fibromyalgia: Secondary | ICD-10-CM | POA: Diagnosis not present

## 2021-11-07 DIAGNOSIS — D6869 Other thrombophilia: Secondary | ICD-10-CM | POA: Diagnosis not present

## 2021-11-07 DIAGNOSIS — Z23 Encounter for immunization: Secondary | ICD-10-CM | POA: Diagnosis not present

## 2021-11-07 DIAGNOSIS — I4891 Unspecified atrial fibrillation: Secondary | ICD-10-CM | POA: Diagnosis not present

## 2021-11-07 DIAGNOSIS — K219 Gastro-esophageal reflux disease without esophagitis: Secondary | ICD-10-CM | POA: Diagnosis not present

## 2021-11-07 DIAGNOSIS — Z1389 Encounter for screening for other disorder: Secondary | ICD-10-CM | POA: Diagnosis not present

## 2021-11-07 DIAGNOSIS — R413 Other amnesia: Secondary | ICD-10-CM | POA: Diagnosis not present

## 2021-11-07 DIAGNOSIS — M170 Bilateral primary osteoarthritis of knee: Secondary | ICD-10-CM | POA: Diagnosis not present

## 2021-11-07 DIAGNOSIS — Z1159 Encounter for screening for other viral diseases: Secondary | ICD-10-CM | POA: Diagnosis not present

## 2021-11-07 DIAGNOSIS — Z0000 Encounter for general adult medical examination without abnormal findings: Secondary | ICD-10-CM | POA: Diagnosis not present

## 2021-11-13 ENCOUNTER — Other Ambulatory Visit: Payer: Self-pay | Admitting: Cardiology

## 2021-11-13 DIAGNOSIS — I493 Ventricular premature depolarization: Secondary | ICD-10-CM

## 2021-11-13 DIAGNOSIS — I471 Supraventricular tachycardia: Secondary | ICD-10-CM

## 2021-11-16 DIAGNOSIS — M170 Bilateral primary osteoarthritis of knee: Secondary | ICD-10-CM | POA: Diagnosis not present

## 2021-11-16 DIAGNOSIS — I10 Essential (primary) hypertension: Secondary | ICD-10-CM | POA: Diagnosis not present

## 2021-11-16 DIAGNOSIS — I4891 Unspecified atrial fibrillation: Secondary | ICD-10-CM | POA: Diagnosis not present

## 2021-11-16 DIAGNOSIS — K219 Gastro-esophageal reflux disease without esophagitis: Secondary | ICD-10-CM | POA: Diagnosis not present

## 2021-11-16 DIAGNOSIS — E7800 Pure hypercholesterolemia, unspecified: Secondary | ICD-10-CM | POA: Diagnosis not present

## 2021-11-22 ENCOUNTER — Other Ambulatory Visit: Payer: Self-pay | Admitting: Cardiology

## 2021-11-22 DIAGNOSIS — I480 Paroxysmal atrial fibrillation: Secondary | ICD-10-CM

## 2021-12-06 DIAGNOSIS — I10 Essential (primary) hypertension: Secondary | ICD-10-CM | POA: Diagnosis not present

## 2021-12-12 DIAGNOSIS — M25512 Pain in left shoulder: Secondary | ICD-10-CM | POA: Diagnosis not present

## 2021-12-12 DIAGNOSIS — M545 Low back pain, unspecified: Secondary | ICD-10-CM | POA: Diagnosis not present

## 2022-01-06 DIAGNOSIS — I10 Essential (primary) hypertension: Secondary | ICD-10-CM | POA: Diagnosis not present

## 2022-01-18 ENCOUNTER — Ambulatory Visit: Payer: Medicare Other | Admitting: Cardiology

## 2022-01-18 ENCOUNTER — Other Ambulatory Visit: Payer: Self-pay

## 2022-01-18 ENCOUNTER — Encounter: Payer: Self-pay | Admitting: Cardiology

## 2022-01-18 VITALS — BP 174/94 | HR 56 | Temp 97.8°F | Ht 70.0 in | Wt 209.0 lb

## 2022-01-18 DIAGNOSIS — E782 Mixed hyperlipidemia: Secondary | ICD-10-CM

## 2022-01-18 DIAGNOSIS — I480 Paroxysmal atrial fibrillation: Secondary | ICD-10-CM

## 2022-01-18 MED ORDER — ROSUVASTATIN CALCIUM 20 MG PO TABS: 20.0000 mg | ORAL_TABLET | Freq: Every day | ORAL | 3 refills | Status: DC

## 2022-01-18 NOTE — Progress Notes (Signed)
Follow up visit  Subjective:   Phyllis Knight, female    DOB: 1941-01-13, 81 y.o.   MRN: [id number]    HPI   Chief Complaint  Patient presents with   Atrial Fibrillation   Hypertension   Follow-up    81 y.o. African American female  with hypertension, fibromyalgia, paroxysmal Afib  Blood pressure elevated in the office, but home avg BP 127/77 mmHg. he does not have any chest pain.  Exertional dyspnea is mild and unchanged.  Biggest complaint today is going to knee pain and back pain.  She underwent surgery on her left knee, and is hoping to undergo surgery on her right knee in the near future.  While reviewed recent lab results with patient, details below.  Current Outpatient Medications on File Prior to Visit  Medication Sig Dispense Refill   amLODipine (NORVASC) 10 MG tablet TAKE 1 TABLET BY MOUTH  DAILY AT NIGHT (Patient taking differently: 5 mg.) 90 tablet 3   chlorpheniramine (CHLOR-TRIMETON) 4 MG tablet Take 4 mg by mouth 2 (two) times daily as needed for allergies.     cholecalciferol (VITAMIN D) 1000 units tablet Take 2,000 Units by mouth daily.      diltiazem (CARDIZEM) 30 MG tablet TAKE 1 TABLET BY MOUTH 3  TIMES DAILY AS NEEDED 270 tablet 3   ELIQUIS 5 MG TABS tablet TAKE 1 TABLET BY MOUTH  TWICE DAILY 180 tablet 3   furosemide (LASIX) 20 MG tablet Take 1 tablet (20 mg total) by mouth daily. (Patient taking differently: Take 0.5 mg by mouth daily.) 90 tablet 2   metoprolol succinate (TOPROL-XL) 50 MG 24 hr tablet TAKE 1 TABLET BY MOUTH  DAILY WITH OR IMMEDIATELY  FOLLOWING A MEAL (Patient taking differently: Take 50 mg by mouth daily.) 30 tablet 6   pantoprazole (PROTONIX) 40 MG tablet Take 40 mg by mouth daily.      potassium chloride (KLOR-CON) 8 MEQ tablet Take 8 mEq by mouth daily.      rosuvastatin (CRESTOR) 10 MG tablet TAKE 1 TABLET BY MOUTH BY MOUTH ONCE DAILY 30 tablet 0   traMADol-acetaminophen (ULTRACET) 37.5-325 MG tablet Take 1 tablet by mouth every 6  (six) hours as needed (soft tissue and muscle pain).     No current facility-administered medications on file prior to visit.    Cardiovascular & other pertient studies:  EKG 01/18/2022: Sinus rhythm 57 bpm Occasional PAC    Nonspecific ST depression  EKG 09/12/2021: Sinus rhythm 61 bpm Frequent ectopic ventricular beats   Mobile cardiac telemetry 7 days 03/21/2021 - 03/29/2021: Dominant rhythm: Sinus. HR 43-90 bpm. Avg HR 61 bpm, in sinus rhythm. 282 episodes of SVT, fastest at 164 bpm for 6 beats, longest for 17.4 secs at 112 bpm. <1% isolated SVE, couplet/triplets. <1% isolated VE, couplets. No atrial fibrillation/atrial flutter/VT/high grade AV block, sinus pause >3sec noted. 0 patient triggered events.   EKG 03/21/2021: Sinus rhythm 62 bpm Possible old anteroseptal infarct  EKG 10/18/2020: Sinus rhythm 64 bpm Early R wave profression, consider old posterior infarct  Lexiscan Myoview stress test 06/07/2019: Lexiscan stress test was performed. Stress EKG is non-diagnostic, as this is pharmacological stress test. In addition, Rest and stress EKG reveal sinus rhythm, frequent PVC's and occasional PAC's.  SPECT images revealed small sized, mildly reversible, mild intensity perfusion defect in basal inferior myocardium. While breast attenuation is possible (imaging performed in sitting position), small area of ischemia cannot be excluded. LVEF 73% with normal wall motion. Low risk  study.   Event monitor 04/28/2019 - 05/11/2019:  1 auto triggered event shows sinus rhythm. No patient triggered events seen. No Afib.    Echocardiogram 02/16/2019:  1. The left ventricle has normal systolic function with an ejection fraction of 60-65%. The cavity size was normal. There is mild concentric left ventricular hypertrophy. Diastolic function could not be assessed due to atrial fibrillation.  2. Low normal RV systolic function.  3. Mild left atrial dilatation. Moderate right atrial  dilatation.  4. Mild to moderate mitral and tricuspid regurgitation. RVSP 29 mmHg.  5. Vavlular regurgitation and chamber dilatation new since previous echocardiogram on 07/17/2018.   Recent labs: 11/07/2021: Glucose 80, BUN/Cr 10/0.53. EGFR 93. Na/K 140/4.2. Rest of the CMP normal H/H 13/39. Chol 152, TG 83, HDL 50, LDL 86 TSH 1.4  10/11/2020: Glucose 80, BUN/Cr 14/0.6. EGFR 84.  HbA1C N/A Chol 139, TG 93, HDL 49, LDL 72 TSH 2.3 normal  01/12/2020: Glucose 81, BUN/Cr 13/0.66. EGFR 97. Na/K 142/4.5. Rest of the CMP normal Chol 207, TG 119, HDL 47, LDL 139.  02/15/2019: Glucose 211, BUN/Cr 12/0.66. EGFR >60. Na/K 135/3.7. Rest of the CMP normal H/H 12/37. MCV 92. Platelets 216 TSH 0.5 normal.   Review of Systems  Cardiovascular:  Negative for chest pain, dyspnea on exertion, leg swelling, palpitations and syncope.  Musculoskeletal:  Positive for joint pain.         Vitals:   01/18/22 1333  BP: (!) 169/96  Pulse: 70  Temp: 97.8 F (36.6 C)  SpO2: 96%      Body mass index is 29.99 kg/m. Filed Weights   01/18/22 1333  Weight: 209 lb (94.8 kg)     Objective:  Physical Exam Vitals and nursing note reviewed.  Constitutional:      General: She is not in acute distress. Neck:     Vascular: No JVD.  Cardiovascular:     Rate and Rhythm: Normal rate and regular rhythm.     Heart sounds: Normal heart sounds. No murmur heard. Pulmonary:     Effort: Pulmonary effort is normal.     Breath sounds: Normal breath sounds. No wheezing or rales.        Assessment & Recommendations:   81 y.o. African American female  with hypertension, fibromyalgia, paroxysmal Afib, PSVT, PVC  Hypertension: Controlled.  Paroxysmal Afib:  CHA2DS2VASc score 4. Annual stroke risk 5%. Continue Eliquis 5 mg twice daily.   Hyperlipidemia: LDL 86.  Increase Crestor from 10 mg to 20 mg daily. Repeat lipid panel and follow-up in 6 months  Should she need to undergo right knee  surgery in the near future, her cardiac risk is low and acceptable.  If needed, Eliquis can be stopped 2 days before the surgery, and resumed after surgery.  F/u in 6 months   [employer], PA-C 01/18/2022, 10:20 AM Office: [phone]

## 2022-01-22 ENCOUNTER — Telehealth: Payer: Self-pay | Admitting: Cardiology

## 2022-01-22 NOTE — Telephone Encounter (Signed)
Patient says she was placed on new medication at last visit, she wants to know which one it is. Please call her back at [phone].

## 2022-01-22 NOTE — Telephone Encounter (Signed)
Attempted to call pt. The only change that was made was her dose of of Rosuvastatin was switched from 10 mg to 20 mg.

## 2022-02-05 DIAGNOSIS — I10 Essential (primary) hypertension: Secondary | ICD-10-CM | POA: Diagnosis not present

## 2022-02-25 NOTE — Progress Notes (Signed)
? ?Follow up visit ? ?Subjective:  ? ?Phyllis Knight, female    DOB: 08-08-1941, 81 y.o.   MRN: [id number] ? ? ? ?HPI ? ? ?No chief complaint on file. ? ? ?81 y.o. African American female  with hypertension, fibromyalgia, paroxysmal Afib ? ?Patient presents for urgent visit with complaints of palpitations and right sided chest pain. Patient reports a single episode of right sided chest pain at rest lasting several minutes over the weekend. She has had no recurrence since then. She is also concerned as she continues to have episodes of palpitations which are brief lasting several seconds occurring several times per week. Patient has not taking PRN diltiazem.  ? ?Patient is enrolled in remote monitoring blood pressure, which is well controlled on home monitoring.  ? ?Current Outpatient Medications on File Prior to Visit  ?Medication Sig Dispense Refill  ? amLODipine (NORVASC) 10 MG tablet TAKE 1 TABLET BY MOUTH  DAILY AT NIGHT (Patient taking differently: 5 mg.) 90 tablet 3  ? chlorpheniramine (CHLOR-TRIMETON) 4 MG tablet Take 4 mg by mouth 2 (two) times daily as needed for allergies.    ? cholecalciferol (VITAMIN D) 1000 units tablet Take 2,000 Units by mouth daily.     ? diltiazem (CARDIZEM) 30 MG tablet TAKE 1 TABLET BY MOUTH 3  TIMES DAILY AS NEEDED 270 tablet 3  ? ELIQUIS 5 MG TABS tablet TAKE 1 TABLET BY MOUTH  TWICE DAILY 180 tablet 3  ? furosemide (LASIX) 20 MG tablet Take 1 tablet (20 mg total) by mouth daily. (Patient taking differently: Take 0.5 mg by mouth daily.) 90 tablet 2  ? metoprolol succinate (TOPROL-XL) 50 MG 24 hr tablet TAKE 1 TABLET BY MOUTH  DAILY WITH OR IMMEDIATELY  FOLLOWING A MEAL (Patient taking differently: Take 50 mg by mouth daily.) 30 tablet 6  ? pantoprazole (PROTONIX) 40 MG tablet Take 40 mg by mouth daily.     ? potassium chloride (KLOR-CON) 8 MEQ tablet Take 8 mEq by mouth daily.     ? rosuvastatin (CRESTOR) 20 MG tablet Take 1 tablet (20 mg total) by mouth daily. 90 tablet 3  ?  traMADol-acetaminophen (ULTRACET) 37.5-325 MG tablet Take 1 tablet by mouth every 6 (six) hours as needed (soft tissue and muscle pain).    ? ?No current facility-administered medications on file prior to visit.  ? ? ?Cardiovascular & other pertient studies: ? EKG 02/26/2022: sinus rhythm with PAC at rate of 60 bpm.  ? ?EKG 01/18/2022: ?Sinus rhythm 57 bpm ?Occasional PAC    ?Nonspecific ST depression ? ?EKG 09/12/2021: ?Sinus rhythm 61 bpm ?Frequent ectopic ventricular beats  ? ?Mobile cardiac telemetry 7 days 03/21/2021 - 03/29/2021: ?Dominant rhythm: Sinus. ?HR 43-90 bpm. Avg HR 61 bpm, in sinus rhythm. ?282 episodes of SVT, fastest at 164 bpm for 6 beats, longest for 17.4 secs at 112 bpm. ?<1% isolated SVE, couplet/triplets. ?<1% isolated VE, couplets. ?No atrial fibrillation/atrial flutter/VT/high grade AV block, sinus pause >3sec noted. ?0 patient triggered events.  ? ?EKG 03/21/2021: ?Sinus rhythm 62 bpm ?Possible old anteroseptal infarct ? ?EKG 10/18/2020: ?Sinus rhythm 64 bpm ?Early R wave profression, consider old posterior infarct ? ?Lexiscan Myoview stress test 06/07/2019: ?Lexiscan stress test was performed. Stress EKG is non-diagnostic, as this is pharmacological stress test. In addition, Rest and stress EKG reveal sinus rhythm, frequent PVC's and occasional PAC's.  ?SPECT images revealed small sized, mildly reversible, mild intensity perfusion defect in basal inferior myocardium. While breast attenuation is possible (imaging performed in  sitting position), small area of ischemia cannot be excluded. LVEF 73% with normal wall motion. ?Low risk study.  ? ?Event monitor 04/28/2019 - 05/11/2019:  ?1 auto triggered event shows sinus rhythm. ?No patient triggered events seen. ?No Afib.   ? ?Echocardiogram 02/16/2019: ? 1. The left ventricle has normal systolic function with an ejection fraction of 60-65%. The cavity size was normal. There is mild concentric left ventricular hypertrophy. Diastolic function could  not be assessed due to atrial fibrillation. ? 2. Low normal RV systolic function. ? 3. Mild left atrial dilatation. Moderate right atrial dilatation. ? 4. Mild to moderate mitral and tricuspid regurgitation. RVSP 29 mmHg. ? 5. Vavlular regurgitation and chamber dilatation new since previous echocardiogram on 07/17/2018. ? ? ?Recent labs: ?11/07/2021: ?Glucose 80, BUN/Cr 10/0.53. EGFR 93. Na/K 140/4.2. Rest of the CMP normal ?H/H 13/39. ?Chol 152, TG 83, HDL 50, LDL 86 ?TSH 1.4 ? ?10/11/2020: ?Glucose 80, BUN/Cr 14/0.6. EGFR 84.  ?HbA1C N/A ?Chol 139, TG 93, HDL 49, LDL 72 ?TSH 2.3 normal ? ?01/12/2020: ?Glucose 81, BUN/Cr 13/0.66. EGFR 97. Na/K 142/4.5. Rest of the CMP normal ?Chol 207, TG 119, HDL 47, LDL 139. ? ?02/15/2019: ?Glucose 211, BUN/Cr 12/0.66. EGFR >60. Na/K 135/3.7. Rest of the CMP normal ?H/H 12/37. MCV 92. Platelets 216 ?TSH 0.5 normal.  ? ?Review of Systems  ?Cardiovascular:  Positive for chest pain (single brief episode of right sided chest pain) and palpitations. Negative for dyspnea on exertion, leg swelling and syncope.  ?Musculoskeletal:  Positive for joint pain.  ? ? ? ?   ? ?There were no vitals filed for this visit. ? ? ? ? ?There is no height or weight on file to calculate BMI. ?There were no vitals filed for this visit. ? ? ? ?Objective:  ?Physical Exam ?Vitals reviewed.  ?Constitutional:   ?   General: She is not in acute distress. ?Neck:  ?   Vascular: No JVD.  ?Cardiovascular:  ?   Rate and Rhythm: Normal rate and regular rhythm. Occasional Extrasystoles are present. ?   Heart sounds: Normal heart sounds. No murmur heard. ?Pulmonary:  ?   Effort: Pulmonary effort is normal.  ?   Breath sounds: Normal breath sounds. No wheezing or rales.  ? ? ? ?   ?Assessment & Recommendations:  ? ?81 y.o. African American female  with hypertension, fibromyalgia, paroxysmal Afib, PSVT, PVC ? ?Palpitations:  ?Likely related to known PVC/PAC and PSVT given symptoms  ?Advised patient regarding vagal maneuvers   ?As well as encouraged her to take diltiazem as needed ?EKG today reveals normal sinus rhythm, reassure patient  ? ?Hypertension: ?Remains well controlled  ?Continue present medications  ? ?Paroxysmal Afib:  ?CHA2DS2VASc score 4. Annual stroke risk 5%. ?Continue Eliquis 5 mg twice daily.  ? ?Hyperlipidemia: ?Continue statin therapy  ? ?Follow up as previously scheduled with Dr. Virgina Jock.  ? ? ?Alethia Berthold, PA-C ?02/26/2022, 12:41 PM ?Office: [phone] ? ?

## 2022-02-26 ENCOUNTER — Ambulatory Visit: Payer: Medicare Other | Admitting: Student

## 2022-02-26 VITALS — BP 138/82 | HR 61 | Temp 97.1°F | Resp 17 | Ht 70.0 in | Wt 208.4 lb

## 2022-02-26 DIAGNOSIS — E782 Mixed hyperlipidemia: Secondary | ICD-10-CM

## 2022-02-26 DIAGNOSIS — I480 Paroxysmal atrial fibrillation: Secondary | ICD-10-CM | POA: Diagnosis not present

## 2022-02-26 DIAGNOSIS — I10 Essential (primary) hypertension: Secondary | ICD-10-CM

## 2022-02-27 ENCOUNTER — Other Ambulatory Visit: Payer: Self-pay

## 2022-02-27 ENCOUNTER — Encounter: Payer: Self-pay | Admitting: Student

## 2022-03-01 DIAGNOSIS — I10 Essential (primary) hypertension: Secondary | ICD-10-CM | POA: Diagnosis not present

## 2022-03-01 DIAGNOSIS — E7800 Pure hypercholesterolemia, unspecified: Secondary | ICD-10-CM | POA: Diagnosis not present

## 2022-03-04 DIAGNOSIS — I10 Essential (primary) hypertension: Secondary | ICD-10-CM | POA: Diagnosis not present

## 2022-03-04 DIAGNOSIS — E7800 Pure hypercholesterolemia, unspecified: Secondary | ICD-10-CM | POA: Diagnosis not present

## 2022-03-04 DIAGNOSIS — K219 Gastro-esophageal reflux disease without esophagitis: Secondary | ICD-10-CM | POA: Diagnosis not present

## 2022-03-08 DIAGNOSIS — I10 Essential (primary) hypertension: Secondary | ICD-10-CM | POA: Diagnosis not present

## 2022-04-07 DIAGNOSIS — I10 Essential (primary) hypertension: Secondary | ICD-10-CM | POA: Diagnosis not present

## 2022-04-23 DIAGNOSIS — H6982 Other specified disorders of Eustachian tube, left ear: Secondary | ICD-10-CM | POA: Diagnosis not present

## 2022-05-08 DIAGNOSIS — I10 Essential (primary) hypertension: Secondary | ICD-10-CM | POA: Diagnosis not present

## 2022-05-09 ENCOUNTER — Emergency Department (HOSPITAL_COMMUNITY)
Admission: EM | Admit: 2022-05-09 | Discharge: 2022-05-10 | Disposition: A | Discharge status: Home / Self Care | Payer: Medicare Other | Attending: Emergency Medicine | Admitting: Emergency Medicine

## 2022-05-09 ENCOUNTER — Emergency Department (HOSPITAL_COMMUNITY): Payer: Medicare Other

## 2022-05-09 DIAGNOSIS — Z7901 Long term (current) use of anticoagulants: Secondary | ICD-10-CM | POA: Insufficient documentation

## 2022-05-09 DIAGNOSIS — R456 Violent behavior: Secondary | ICD-10-CM | POA: Diagnosis not present

## 2022-05-09 DIAGNOSIS — I10 Essential (primary) hypertension: Secondary | ICD-10-CM | POA: Diagnosis not present

## 2022-05-09 DIAGNOSIS — Z79899 Other long term (current) drug therapy: Secondary | ICD-10-CM | POA: Diagnosis not present

## 2022-05-09 DIAGNOSIS — F22 Delusional disorders: Secondary | ICD-10-CM | POA: Insufficient documentation

## 2022-05-09 DIAGNOSIS — I4891 Unspecified atrial fibrillation: Secondary | ICD-10-CM | POA: Insufficient documentation

## 2022-05-09 DIAGNOSIS — R4689 Other symptoms and signs involving appearance and behavior: Secondary | ICD-10-CM

## 2022-05-09 DIAGNOSIS — R443 Hallucinations, unspecified: Secondary | ICD-10-CM

## 2022-05-09 DIAGNOSIS — Z20822 Contact with and (suspected) exposure to covid-19: Secondary | ICD-10-CM | POA: Diagnosis not present

## 2022-05-09 DIAGNOSIS — R410 Disorientation, unspecified: Secondary | ICD-10-CM

## 2022-05-09 DIAGNOSIS — R001 Bradycardia, unspecified: Secondary | ICD-10-CM | POA: Diagnosis not present

## 2022-05-09 DIAGNOSIS — Z046 Encounter for general psychiatric examination, requested by authority: Secondary | ICD-10-CM | POA: Diagnosis present

## 2022-05-09 DIAGNOSIS — R4189 Other symptoms and signs involving cognitive functions and awareness: Secondary | ICD-10-CM

## 2022-05-09 DIAGNOSIS — Z743 Need for continuous supervision: Secondary | ICD-10-CM | POA: Diagnosis not present

## 2022-05-09 DIAGNOSIS — F0390 Unspecified dementia without behavioral disturbance: Secondary | ICD-10-CM | POA: Diagnosis not present

## 2022-05-09 LAB — RAPID URINE DRUG SCREEN, HOSP PERFORMED
Amphetamines: NOT DETECTED
Barbiturates: NOT DETECTED
Benzodiazepines: NOT DETECTED
Cocaine: NOT DETECTED
Opiates: NOT DETECTED
Tetrahydrocannabinol: NOT DETECTED

## 2022-05-09 LAB — CBC WITH DIFFERENTIAL/PLATELET
Abs Immature Granulocytes: 0.01 10*3/uL (ref 0.00–0.07)
Basophils Absolute: 0 10*3/uL (ref 0.0–0.1)
Basophils Relative: 0 %
Eosinophils Absolute: 0.2 10*3/uL (ref 0.0–0.5)
Eosinophils Relative: 4 %
HCT: 41.5 % (ref 36.0–46.0)
Hemoglobin: 14.1 g/dL (ref 12.0–15.0)
Immature Granulocytes: 0 %
Lymphocytes Relative: 41 %
Lymphs Abs: 1.9 10*3/uL (ref 0.7–4.0)
MCH: 32.3 pg (ref 26.0–34.0)
MCHC: 34 g/dL (ref 30.0–36.0)
MCV: 95.2 fL (ref 80.0–100.0)
Monocytes Absolute: 0.4 10*3/uL (ref 0.1–1.0)
Monocytes Relative: 10 %
Neutro Abs: 2.1 10*3/uL (ref 1.7–7.7)
Neutrophils Relative %: 45 %
Platelets: 215 10*3/uL (ref 150–400)
RBC: 4.36 MIL/uL (ref 3.87–5.11)
RDW: 12.2 % (ref 11.5–15.5)
WBC: 4.6 10*3/uL (ref 4.0–10.5)
nRBC: 0 % (ref 0.0–0.2)

## 2022-05-09 LAB — URINALYSIS, ROUTINE W REFLEX MICROSCOPIC
Bacteria, UA: NONE SEEN
Bilirubin Urine: NEGATIVE
Glucose, UA: NEGATIVE mg/dL
Hgb urine dipstick: NEGATIVE
Ketones, ur: NEGATIVE mg/dL
Nitrite: NEGATIVE
Protein, ur: NEGATIVE mg/dL
Specific Gravity, Urine: 1.01 (ref 1.005–1.030)
pH: 8 (ref 5.0–8.0)

## 2022-05-09 LAB — COMPREHENSIVE METABOLIC PANEL
ALT: 11 U/L (ref 0–44)
AST: 19 U/L (ref 15–41)
Albumin: 3.6 g/dL (ref 3.5–5.0)
Alkaline Phosphatase: 71 U/L (ref 38–126)
Anion gap: 6 (ref 5–15)
BUN: 11 mg/dL (ref 8–23)
CO2: 27 mmol/L (ref 22–32)
Calcium: 9.1 mg/dL (ref 8.9–10.3)
Chloride: 107 mmol/L (ref 98–111)
Creatinine, Ser: 0.6 mg/dL (ref 0.44–1.00)
GFR, Estimated: >60 mL/min (ref >60)
Glucose, Bld: 83 mg/dL (ref 70–99)
Potassium: 3.7 mmol/L (ref 3.5–5.1)
Sodium: 140 mmol/L (ref 135–145)
Total Bilirubin: 0.6 mg/dL (ref 0.3–1.2)
Total Protein: 7.4 g/dL (ref 6.5–8.1)

## 2022-05-09 LAB — ETHANOL: Alcohol, Ethyl (B): <10 mg/dL (ref <10)

## 2022-05-09 LAB — SARS CORONAVIRUS 2 BY RT PCR: SARS Coronavirus 2 by RT PCR: NEGATIVE

## 2022-05-09 MED ORDER — FUROSEMIDE 20 MG PO TABS: 12.5000 mg | ORAL_TABLET | Freq: Every day | ORAL | Status: DC

## 2022-05-09 MED ORDER — AMLODIPINE BESYLATE 5 MG PO TABS
10.0000 mg | ORAL_TABLET | Freq: Every day | ORAL | Status: DC
  Administered 2022-05-10: 10 mg via ORAL
  Filled 2022-05-09: qty 2

## 2022-05-09 MED ORDER — APIXABAN 5 MG PO TABS
5.0000 mg | ORAL_TABLET | Freq: Two times a day (BID) | ORAL | Status: DC
  Administered 2022-05-09 – 2022-05-10 (×2): 5 mg via ORAL
  Filled 2022-05-09 (×2): qty 1

## 2022-05-09 MED ORDER — VITAMIN D 25 MCG (1000 UNIT) PO TABS
2000.0000 [IU] | ORAL_TABLET | Freq: Every day | ORAL | Status: DC
Start: 2022-05-10 — End: 2022-05-10
  Administered 2022-05-10: 2000 [IU] via ORAL
  Filled 2022-05-09: qty 2

## 2022-05-09 MED ORDER — PANTOPRAZOLE SODIUM 40 MG PO TBEC
40.0000 mg | DELAYED_RELEASE_TABLET | Freq: Every day | ORAL | Status: DC
  Administered 2022-05-10: 40 mg via ORAL
  Filled 2022-05-09: qty 1

## 2022-05-09 MED ORDER — LORATADINE 10 MG PO TABS
10.0000 mg | ORAL_TABLET | Freq: Every day | ORAL | Status: DC
  Administered 2022-05-10: 10 mg via ORAL
  Filled 2022-05-09: qty 1

## 2022-05-09 MED ORDER — METOPROLOL SUCCINATE ER 25 MG PO TB24
50.0000 mg | ORAL_TABLET | Freq: Every day | ORAL | Status: DC
  Administered 2022-05-10: 50 mg via ORAL
  Filled 2022-05-09: qty 2

## 2022-05-09 MED ORDER — FUROSEMIDE 20 MG PO TABS
10.0000 mg | ORAL_TABLET | Freq: Every day | ORAL | Status: DC
  Administered 2022-05-10: 10 mg via ORAL
  Filled 2022-05-09: qty 1

## 2022-05-09 MED ORDER — ROSUVASTATIN CALCIUM 5 MG PO TABS
20.0000 mg | ORAL_TABLET | Freq: Every day | ORAL | Status: DC
  Administered 2022-05-10: 20 mg via ORAL
  Filled 2022-05-09: qty 1

## 2022-05-09 NOTE — ED Triage Notes (Signed)
Pt arrived to ED via EMS w/ [facility] for IVC. Per paperwork, pt has dementia and alzheimers and hasn't been taking her medications. Pt having hallucinations, paranoia, threatening family.

## 2022-05-09 NOTE — ED Provider Notes (Signed)
[facility] EMERGENCY DEPARTMENT Provider Note   CSN: [id number] Arrival date & time: 05/09/22  1851     History  Chief Complaint  Patient presents with   IVC    Phyllis Knight is a 81 y.o. female.  Family brought patient here with IVC filled.  Suspected history of dementia, she has been having memory issues for over a year.  Patient with history of hypertension, atrial fibrillation on blood thinners.  Increase in aggressive behavior, confusion, hallucinations, paranoia over the last several months.  She is not taking her medications.  Family concerned about her mental health and ability to take care of herself.  Denies any history of bipolar or schizophrenia or other mental health issues.  They do not feel safe with her at home.  They have filled IVC.  Patient has no complaints.  She can tell me her name.  Denies any chest pain or abdominal pain or falls.  The history is provided by the patient and a relative.       Home Medications Prior to Admission medications   Medication Sig Start Date End Date Taking? Authorizing Provider  amLODipine (NORVASC) 10 MG tablet TAKE 1 TABLET BY MOUTH  DAILY AT NIGHT Patient taking differently: Take 10 mg by mouth daily. 08/17/21   Cantwell, Celeste C, PA-C  cholecalciferol (VITAMIN D) 1000 units tablet Take 2,000 Units by mouth daily.     [provider]  diltiazem (CARDIZEM) 30 MG tablet TAKE 1 TABLET BY MOUTH 3  TIMES DAILY AS NEEDED Patient taking differently: Take 30 mg by mouth 3 (three) times daily as needed. 11/13/21   Patwardhan, Manish J, MD  ELIQUIS 5 MG TABS tablet TAKE 1 TABLET BY MOUTH  TWICE DAILY Patient taking differently: Take 5 mg by mouth 2 (two) times daily. 11/22/21   Patwardhan, Reynold Bowen, MD  furosemide (LASIX) 20 MG tablet Take 1 tablet (20 mg total) by mouth daily. Patient taking differently: Take 12.5 mg by mouth daily. 06/20/20   Patwardhan, Reynold Bowen, MD  metoprolol succinate (TOPROL-XL) 50 MG  24 hr tablet TAKE 1 TABLET BY MOUTH  DAILY WITH OR IMMEDIATELY  FOLLOWING A MEAL Patient taking differently: Take 50 mg by mouth daily. 04/18/20   Patwardhan, Manish J, MD  pantoprazole (PROTONIX) 40 MG tablet Take 40 mg by mouth daily.  01/26/19   [provider]  potassium chloride (KLOR-CON) 8 MEQ tablet Take 8 mEq by mouth daily.  04/17/15   [provider]  rosuvastatin (CRESTOR) 20 MG tablet Take 1 tablet (20 mg total) by mouth daily. 01/18/22   Patwardhan, Reynold Bowen, MD  traMADol-acetaminophen (ULTRACET) 37.5-325 MG tablet Take 1 tablet by mouth every 6 (six) hours as needed (soft tissue and muscle pain). 08/04/20   [provider]      Allergies    Codeine and Zofran [ondansetron hcl]    Review of Systems   Review of Systems  Physical Exam Updated Vital Signs BP (!) 153/62   Pulse (!) 57   Temp 98.2 F (36.8 C)   Resp 18   SpO2 99%  Physical Exam Vitals and nursing note reviewed.  Constitutional:      General: She is not in acute distress.    Appearance: She is well-developed.  HENT:     Head: Normocephalic and atraumatic.     Nose: Nose normal.     Mouth/Throat:     Mouth: Mucous membranes are moist.  Eyes:     Extraocular  Movements: Extraocular movements intact.     Conjunctiva/sclera: Conjunctivae normal.     Pupils: Pupils are equal, round, and reactive to light.  Cardiovascular:     Rate and Rhythm: Normal rate and regular rhythm.     Pulses: Normal pulses.     Heart sounds: Normal heart sounds. No murmur heard. Pulmonary:     Effort: Pulmonary effort is normal. No respiratory distress.     Breath sounds: Normal breath sounds.  Abdominal:     Palpations: Abdomen is soft.     Tenderness: There is no abdominal tenderness.  Musculoskeletal:        General: No swelling.     Cervical back: Normal range of motion and neck supple.  Skin:    General: Skin is warm and dry.     Capillary Refill: Capillary refill takes less than 2 seconds.   Neurological:     General: No focal deficit present.     Mental Status: She is alert.     Cranial Nerves: No cranial nerve deficit.     Sensory: No sensory deficit.     Motor: No weakness.  Psychiatric:        Mood and Affect: Mood normal.     Comments: Little bit rapid speech, some paranoid/delusional thoughts, no SI or HI     ED Results / Procedures / Treatments   Labs (all labs ordered are listed, but only abnormal results are displayed) Labs Reviewed  URINALYSIS, ROUTINE W REFLEX MICROSCOPIC - Abnormal; Notable for the following components:      Result Value   Leukocytes,Ua TRACE (*)    All other components within normal limits  SARS CORONAVIRUS 2 BY RT PCR  COMPREHENSIVE METABOLIC PANEL  ETHANOL  RAPID URINE DRUG SCREEN, HOSP PERFORMED  CBC WITH DIFFERENTIAL/PLATELET    EKG EKG Interpretation  Date/Time:  Thursday May 09 2022 20:11:13 EDT Ventricular Rate:  58 PR Interval:  194 QRS Duration: 92 QT Interval:  428 QTC Calculation: 420 R Axis:   55 Text Interpretation: Sinus bradycardia Otherwise normal ECG When compared with ECG of 09-May-2022 20:08, PREVIOUS ECG IS PRESENT Confirmed by Lennice Sites ([id number]) on 05/09/2022 10:02:29 PM  Radiology CT Head Wo Contrast  Result Date: 05/09/2022 CLINICAL DATA:  Mental status change, unknown cause. EXAM: CT HEAD WITHOUT CONTRAST TECHNIQUE: Contiguous axial images were obtained from the base of the skull through the vertex without intravenous contrast. RADIATION DOSE REDUCTION: This exam was performed according to the departmental dose-optimization program which includes automated exposure control, adjustment of the mA and/or kV according to patient size and/or use of iterative reconstruction technique. COMPARISON:  05/07/2015, 07/09/2021. FINDINGS: Brain: No acute intracranial hemorrhage, midline shift or mass effect. No extra-axial fluid collection. Mild atrophy is noted. Mild periventricular white matter hypodensities are noted  bilaterally. No hydrocephalus. Vascular: No hyperdense vessel or unexpected calcification. Skull: Normal. Negative for fracture or focal lesion. Sinuses/Orbits: No acute finding. Other: Complete opacification of the mastoid air cells on the left, unchanged from 2016. IMPRESSION: 1. No acute intracranial process. 2. Atrophy with chronic microvascular ischemic changes. Electronically Signed   By: Brett Fairy M.D.   On: 05/09/2022 20:16    Procedures Procedures    Medications Ordered in ED Medications - No data to display  ED Course/ Medical Decision Making/ A&P                           Medical Decision Making Amount and/or Complexity of  Data Reviewed Labs: ordered. Radiology: ordered.   Buford L Wilmott is here with hallucinations, aggressive behavior, paranoid/delusional thoughts.  Patient with normal vitals.  No fever.  History of atrial fibrillation on blood thinner, hypertension, suspected dementia.  She has been having memory issues for the past year and now more recently with hallucinations, paranoid and delusional thoughts.  At times aggressive behavior.  Family filled out IVC.  They do not feel safe with her at home and think that she might be a harm to herself.  She has not been able to take care of herself well.  Not taking her medications.  Patient has delusional paranoid thoughts on my evaluation.  But neurologically she seems intact.  Medical screening work-up was initiated with blood work and EKG.  Head CT also performed.  Per my review and interpretation of labs and images there is no acute findings.  Head CT is normal.  EKG shows sinus bradycardia.  There is no significant leukocytosis, anemia, electrolyte abnormality.  No UTI.  Overall she is medically cleared at this time.  We will have her evaluated by psychiatry.  We will have social work/case management evaluate her as well.    This chart was dictated using voice recognition software.  Despite best efforts to proofread,   errors can occur which can change the documentation meaning.         Final Clinical Impression(s) / ED Diagnoses Final diagnoses:  Hallucination  Aggressive behavior    Rx / DC Orders ED Discharge Orders     None         Lennice Sites, DO 05/09/22 2230
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
## 2022-05-10 ENCOUNTER — Encounter (HOSPITAL_COMMUNITY): Payer: Self-pay

## 2022-05-10 ENCOUNTER — Other Ambulatory Visit: Payer: Self-pay

## 2022-05-10 DIAGNOSIS — F0390 Unspecified dementia without behavioral disturbance: Secondary | ICD-10-CM

## 2022-05-10 DIAGNOSIS — R410 Disorientation, unspecified: Secondary | ICD-10-CM

## 2022-05-10 DIAGNOSIS — R4189 Other symptoms and signs involving cognitive functions and awareness: Secondary | ICD-10-CM

## 2022-05-10 NOTE — ED Notes (Signed)
SW at bedside.

## 2022-05-10 NOTE — ED Notes (Signed)
Pt requested to use the BR. Writer asked if she could walk, pt stated "I can with a wheelchair". Writer looked for a wheelchair and took her to the BR. Pt did well standing and pivoting. Pt back in her room.

## 2022-05-10 NOTE — ED Notes (Signed)
SW spoke patient's children at bedside and advised them patient would discharge with resources.  SW left to get discharge paperwork for EDP.  During the process of obtaining paperwork patient's children left the hospital.  Writer called patient's daughter Jari Pigg at [phone] as was informed that patient's husband will pick up patient "within the hour."

## 2022-05-10 NOTE — ED Notes (Signed)
Visitation hrs explained to pt.'s spouse. Belongings given to spouse to take home

## 2022-05-10 NOTE — ED Notes (Signed)
Patient requested to call husband.  Husband informed patient he was in the shower and would be arriving to pick her up soon.

## 2022-05-10 NOTE — ED Notes (Signed)
Patient discharged home in care of husband.  All belongings return.

## 2022-05-10 NOTE — Consult Note (Cosign Needed Addendum)
Telepsych Consultation   Reason for Consult:  Psychiatric Reassessment for  Referring Physician:  EDP Location of Patient:    [facility] Location of Provider: Other: virtual home office  Patient Identification: ZENNIE GOLTZ MRN:  [id number] Principal Diagnosis: Cognitive impairment Diagnosis:  Principal Problem:   Cognitive impairment Active Problems:   Confusion   Total Time spent with patient: 30 minutes  Subjective:   AZIAH CEPERO is a 81 y.o. female patient with dementia admitted via IVC by her family for aggressive behaviors, confusion and hallucinations, that started over the past few months. Additionally, she's been having worsening memory problems over the past year.   HPI:  Patient seen via telepsych by this provider; chart reviewed and consulted with Dr. Dwyane Dee on 05/10/22.  On evaluation Mekala L Partipilo who is alert and oriented x4; when asked how she is doing patient states, "I have a lot of things going on in my head that upset me.  I don't want to go to the hospital.  I don't want to be locked up."  When asked to elaborate on events that led to current admissions she tries to participate but demonstrates some difficulty in communicating HPI but responds to safety questions.  She denies suicidal or homicidal ideations.  When asked about AVH she continuously asks for the question to be repeated. Eventually denies AVH, although her family reported this as a concern on admissions.    Ms. Leonides Schanz is an 81 year old female pt who known history for advanced stage dementia.  Head CT completed 05/09/2022 demonstrates atrophy with chronic microvascular ischemic changes this is unchanged from previous CT completed 07/2018.  unfortunately, this is a progressive dx and pt may not benefit from inpatient admission.  Additionally, there's not acute geriatric psych placement for patients with dementia but she may benefit from memory care unit long term placement of which her family can work  with social work to coordinate on an outpatient basis.     Spoke with pts spouse Carmelina Peal who is present during the assessment.  He reports ,"she will not take charge of anything" states when she misplaces things she blames him or may say someone has broken in the house and moved he things. States his daughter lives with them and pt has accused him of having an affair with her.  She vehemently denies this and reiterates this to the patient during assessment.  He does not provide safety concerns to patient returning home, rather reports his frustration with her delusional behaviors.  Offered some insight regarding pt disease progression and offered to continue discussion via telephone as pt seen becoming upset while listening to her husband.  I reviewed above plan of care and outpatient recommendations with him.    Pt gave permission to talk with her daughter.  Attempted to contact pt daughter Daneil Dolin at [phone] but mailbox full, unable to leave a message.    Reviewed labs, CMP, CBC are WNL; Urine drug screen is negative; U/A shows trace leukocytes but negative nitrates;  head CT as outline above show chronic microvascular changes, no acute findings. There is no acute medical findings to exacerbate patient's underlying cognitive impairment.     Per EDP Admission Assessment 05/09/2022: Chief Complaint  Patient presents with   IVC      SAKILE FILAR is a 81 y.o. female.   Family brought patient here with IVC filled.  Suspected history of dementia, she has been having memory issues for over a year.  Patient with history of hypertension, atrial fibrillation on blood thinners.  Increase in aggressive behavior, confusion, hallucinations, paranoia over the last several months.  She is not taking her medications.  Family concerned about her mental health and ability to take care of herself.  Denies any history of bipolar or schizophrenia or other mental health issues.  They do not feel safe  with her at home.  They have filled IVC.  Patient has no complaints.  She can tell me her name.  Denies any chest pain or abdominal pain or falls.   The history is provided by the patient and a relative.    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   GERD (gastroesophageal reflux disease)    H/O hiatal hernia    Hearing loss    left ear    History of uterine cancer 1975   s/p hysterectomy   Hypertension    Numbness and tingling    hands and feet bilat    Pneumonia yrs ago   Shortness of breath dyspnea    Syncope 07/16/2018   Tinnitus    Urge urinary incontinence    Urinary incontinence    Vitamin D deficiency     Past Surgical History:  Procedure Laterality Date   CERVICAL FUSION  2011   CHOLECYSTECTOMY N/A 06/14/2014   Procedure: LAPAROSCOPIC CHOLECYSTECTOMY WITH attempted INTRAOPERATIVE CHOLANGIOGRAM;  Surgeon: Shann Medal, MD;  Location: [facility];  Service: General;  Laterality: N/A;   INSERTION OF MESH N/A 07/18/2015   Procedure: INSERTION OF MESH;  Surgeon: Alphonsa Overall, MD;  Location: [facility];  Service: General;  Laterality: N/A;   KNEE CLOSED REDUCTION Left 09/21/2013   Procedure: CLOSED MANIPULATION LEFT KNEE;  Surgeon: Mauri Pole, MD;  Location: [place];  Service: Orthopedics;  Laterality: Left;   TOTAL ABDOMINAL HYSTERECTOMY W/ BILATERAL SALPINGOOPHORECTOMY  1972   TOTAL KNEE ARTHROPLASTY Left 08/03/2013   Procedure: LEFT TOTAL KNEE ARTHROPLASTY;  Surgeon: Mauri Pole, MD;  Location: [facility];  Service:  Orthopedics;  Laterality: Left;   tracheotomy     TYMPANOPLASTY Left    VENTRAL HERNIA REPAIR N/A 07/18/2015   Procedure: LAPAROSCOPIC VENTRAL AND UMBILICAL  HERNIA LYSIS OF ADHESIONS;  Surgeon: Alphonsa Overall, MD;  Location: [facility];  Service: General;  Laterality: N/A;   Family History:  Family History  Problem Relation Age of Onset   Stroke Mother    Stroke Father    Family Psychiatric  History: unknown Social History:  Social History   Substance and Sexual Activity  Alcohol Use No     Social History   Substance and Sexual Activity  Drug Use No    Social History   Socioeconomic History   Marital status: Married    Spouse name: Not on file   Number of children: 4   Years of education: Not on file   Highest education level: Not on file  Occupational History   Not on file  Tobacco Use   Smoking status: Never   Smokeless tobacco: Never  Vaping Use   Vaping Use: Never used  Substance and Sexual Activity   Alcohol use: No   Drug use: No   Sexual activity: Not on file  Other Topics Concern   Not on file  Social History Narrative   Not on file   Social Determinants of Health   Financial Resource Strain: Not on file  Food Insecurity: Not on file  Transportation Needs: Not on file  Physical Activity: Not on file  Stress: Not on file  Social Connections: Not on file   Additional Social History:    Allergies:   Allergies  Allergen Reactions   Codeine Nausea And Vomiting   Zofran [Ondansetron Hcl] Rash    Labs:  Results for orders placed or performed during the hospital encounter of 05/09/22 (from the past 48 hour(s))  Urine rapid drug screen (hosp performed)     Status: None   Collection Time: 05/09/22  6:51 PM  Result Value Ref Range   Opiates NONE DETECTED NONE DETECTED   Cocaine NONE DETECTED NONE DETECTED   Benzodiazepines NONE DETECTED NONE DETECTED   Amphetamines NONE DETECTED NONE DETECTED   Tetrahydrocannabinol NONE DETECTED NONE DETECTED    Barbiturates NONE DETECTED NONE DETECTED    Comment: (NOTE) DRUG SCREEN FOR MEDICAL PURPOSES ONLY.  IF CONFIRMATION IS NEEDED FOR ANY PURPOSE, NOTIFY LAB WITHIN 5 DAYS.  LOWEST DETECTABLE LIMITS FOR URINE DRUG SCREEN Drug Class                     Cutoff (ng/mL) Amphetamine and metabolites    1000 Barbiturate and metabolites    200 Benzodiazepine                 [id number] Tricyclics and metabolites     300 Opiates and metabolites        300 Cocaine and metabolites        300 THC                            50 Performed at [facility], [place] [address], [place], [place] [address]   Urinalysis, Routine w reflex microscopic Urine, Clean Catch     Status: Abnormal   Collection Time: 05/09/22  6:51 PM  Result Value Ref Range   Color, Urine YELLOW YELLOW   APPearance CLEAR CLEAR   Specific Gravity, Urine 1.010 1.005 - 1.030   pH 8.0 5.0 - 8.0   Glucose, UA NEGATIVE NEGATIVE mg/dL   Hgb urine dipstick NEGATIVE NEGATIVE   Bilirubin Urine NEGATIVE NEGATIVE   Ketones, ur NEGATIVE NEGATIVE mg/dL   Protein, ur NEGATIVE NEGATIVE mg/dL   Nitrite NEGATIVE NEGATIVE   Leukocytes,Ua TRACE (A) NEGATIVE   RBC / HPF 0-5 0 - 5 RBC/hpf   WBC, UA 0-5 0 - 5 WBC/hpf   Bacteria, UA NONE SEEN NONE SEEN   Squamous Epithelial / LPF 0-5 0 - 5    Comment: Performed at [facility], [place] [address] [place], [place] [address]  Comprehensive metabolic panel     Status: None   Collection Time: 05/09/22  8:48 PM  Result Value Ref Range   Sodium 140 135 - 145 mmol/L   Potassium 3.7 3.5 - 5.1 mmol/L   Chloride 107 98 - 111 mmol/L   CO2 27 22 - 32 mmol/L  Glucose, Bld 83 70 - 99 mg/dL    Comment: Glucose reference range applies only to samples taken after fasting for at least 8 hours.   BUN 11 8 - 23 mg/dL   Creatinine, Ser 0.60 0.44 - 1.00 mg/dL   Calcium 9.1 8.9 - 10.3 mg/dL   Total Protein 7.4 6.5 - 8.1 g/dL   Albumin 3.6 3.5 - 5.0 g/dL   AST 19 15 - 41 U/L   ALT 11 0 - 44 U/L   Alkaline  Phosphatase 71 38 - 126 U/L   Total Bilirubin 0.6 0.3 - 1.2 mg/dL   GFR, Estimated >60 >60 mL/min    Comment: (NOTE) Calculated using the CKD-EPI Creatinine Equation (2021)    Anion gap 6 5 - 15    Comment: Performed at [facility] [address], [place], [place] [address]  Ethanol     Status: None   Collection Time: 05/09/22  8:48 PM  Result Value Ref Range   Alcohol, Ethyl (B) <10 <10 mg/dL    Comment: (NOTE) Lowest detectable limit for serum alcohol is 10 mg/dL.  For medical purposes only. Performed at [facility], [place] [address] [place], [place] [address]   CBC with Diff     Status: None   Collection Time: 05/09/22  8:48 PM  Result Value Ref Range   WBC 4.6 4.0 - 10.5 K/uL   RBC 4.36 3.87 - 5.11 MIL/uL   Hemoglobin 14.1 12.0 - 15.0 g/dL   HCT 41.5 36.0 - 46.0 %   MCV 95.2 80.0 - 100.0 fL   MCH 32.3 26.0 - 34.0 pg   MCHC 34.0 30.0 - 36.0 g/dL   RDW 12.2 11.5 - 15.5 %   Platelets 215 150 - 400 K/uL   nRBC 0.0 0.0 - 0.2 %   Neutrophils Relative % 45 %   Neutro Abs 2.1 1.7 - 7.7 K/uL   Lymphocytes Relative 41 %   Lymphs Abs 1.9 0.7 - 4.0 K/uL   Monocytes Relative 10 %   Monocytes Absolute 0.4 0.1 - 1.0 K/uL   Eosinophils Relative 4 %   Eosinophils Absolute 0.2 0.0 - 0.5 K/uL   Basophils Relative 0 %   Basophils Absolute 0.0 0.0 - 0.1 K/uL   Immature Granulocytes 0 %   Abs Immature Granulocytes 0.01 0.00 - 0.07 K/uL    Comment: Performed at [facility], [address], [place],  [address]  SARS Coronavirus 2 by RT PCR (hospital order, performed in [facility] hospital lab) *cepheid single result test* Anterior Nasal Swab     Status: None   Collection Time: 05/09/22  8:49 PM   Specimen: Anterior Nasal Swab  Result Value Ref Range   SARS Coronavirus 2 by RT PCR NEGATIVE NEGATIVE    Comment: (NOTE) SARS-CoV-2 target nucleic acids are NOT DETECTED.  The SARS-CoV-2 RNA is generally detectable in upper and lower respiratory specimens  during the acute phase of infection. The lowest concentration of SARS-CoV-2 viral copies this assay can detect is 250 copies / mL. A negative result does not preclude SARS-CoV-2 infection and should not be used as the sole basis for treatment or other patient management decisions.  A negative result may occur with improper specimen collection / handling, submission of specimen other than nasopharyngeal swab, presence of viral mutation(s) within the areas targeted by this assay, and inadequate number of viral copies (<250 copies / mL). A negative result must be combined with clinical observations,  patient history, and epidemiological information.  Fact Sheet for Patients:   https://www.patel.info/  Fact Sheet for Healthcare Providers: https://hall.com/  This test is not yet approved or  cleared by the [place] FDA and has been authorized for detection and/or diagnosis of SARS-CoV-2 by FDA under an Emergency Use Authorization (EUA).  This EUA will remain in effect (meaning this test can be used) for the duration of the COVID-19 declaration under Section 564(b)(1) of the Act, 21 U.S.C. section 360bbb-3(b)(1), unless the authorization is terminated or revoked sooner.  Performed at [facility], [place] [address], [place], [place] [address]     Medications:  Current Facility-Administered Medications  Medication Dose Route Frequency Provider Last Rate Last Admin   amLODipine (NORVASC) tablet 10 mg  10 mg Oral Daily Curatolo, Adam, DO   10 mg at 05/10/22 0920   apixaban (ELIQUIS) tablet 5 mg  5 mg Oral BID Curatolo, Adam, DO   5 mg at 05/10/22 0920   cholecalciferol (VITAMIN D3) tablet 2,000 Units  2,000 Units Oral Daily Curatolo, Adam, DO   2,000 Units at 05/10/22 [id number]   furosemide (LASIX) tablet 10 mg  10 mg Oral Daily Curatolo, Adam, DO   10 mg at 05/10/22 0921   loratadine (CLARITIN) tablet 10 mg  10 mg Oral Daily Curatolo, Adam, DO    10 mg at 05/10/22 [id number]   metoprolol succinate (TOPROL-XL) 24 hr tablet 50 mg  50 mg Oral Daily Curatolo, Adam, DO   50 mg at 05/10/22 0920   pantoprazole (PROTONIX) EC tablet 40 mg  40 mg Oral Daily Curatolo, Adam, DO   40 mg at 05/10/22 [id number]   rosuvastatin (CRESTOR) tablet 20 mg  20 mg Oral Daily Curatolo, Adam, DO   20 mg at 05/10/22 [id number]   Current Outpatient Medications  Medication Sig Dispense Refill   amLODipine (NORVASC) 10 MG tablet TAKE 1 TABLET BY MOUTH  DAILY AT NIGHT (Patient taking differently: Take 5 mg by mouth at bedtime.) 90 tablet 3   cetirizine (ZYRTEC) 10 MG tablet Take 10 mg by mouth daily.     cholecalciferol (VITAMIN D) 1000 units tablet Take 2,000 Units by mouth daily.      diltiazem (CARDIZEM) 30 MG tablet TAKE 1 TABLET BY MOUTH 3  TIMES DAILY AS NEEDED (Patient taking differently: Take 30 mg by mouth 3 (three) times daily as needed (increased heart rate).) 270 tablet 3   ELIQUIS 5 MG TABS tablet TAKE 1 TABLET BY MOUTH  TWICE DAILY (Patient taking differently: Take 5 mg by mouth 2 (two) times daily.) 180 tablet 3   furosemide (LASIX) 20 MG tablet Take 1 tablet (20 mg total) by mouth daily. (Patient taking differently: Take 10 mg by mouth daily.) 90 tablet 2   metoprolol succinate (TOPROL-XL) 50 MG 24 hr tablet TAKE 1 TABLET BY MOUTH  DAILY WITH OR IMMEDIATELY  FOLLOWING A MEAL (Patient taking differently: Take 50 mg by mouth daily.) 30 tablet 6   pantoprazole (PROTONIX) 40 MG tablet Take 40 mg by mouth daily.      potassium chloride (KLOR-CON) 8 MEQ tablet Take 8 mEq by mouth daily.      rosuvastatin (CRESTOR) 20 MG tablet Take 1 tablet (20 mg total) by mouth daily. 90 tablet 3   traMADol-acetaminophen (ULTRACET) 37.5-325 MG tablet Take 1 tablet by mouth every 6 (six) hours as needed (soft tissue and muscle pain). (Patient not taking: Reported on 05/09/2022)      Musculoskeletal:pt moves all extremities without  concern; did not witness pt ambulating as assessment completed  via telepsychiatry. Strength & Muscle Tone:  did not witness Gait & Station:  did not witness Patient leans:  did not witness    Psychiatric Specialty Exam:  Presentation  General Appearance: Appropriate for Environment; Casual  Eye Contact:Fair  Speech:Clear and Coherent  Speech Volume:Normal  Handedness:Right   Mood and Affect  Mood:Anxious  Affect:Appropriate; Congruent   Thought Process  Thought Processes:Coherent  Descriptions of Associations:Intact  Orientation:Full (Time, Place and Person)  Thought Content:Logical  History of Schizophrenia/Schizoaffective disorder:No data recorded Duration of Psychotic Symptoms:No data recorded Hallucinations:Hallucinations: None  Ideas of Reference:Delusions (delusions are self tormenting)  Suicidal Thoughts:Suicidal Thoughts: No  Homicidal Thoughts:Homicidal Thoughts: No   Sensorium  Memory:Immediate Good  Judgment:Fair (pt with dementia so at baseline, may be impulsive)  Insight:Lacking   Executive Functions  Concentration:Fair  Attention Span:Fair  [place]  Language:Good   Psychomotor Activity  Psychomotor Activity:Psychomotor Activity: Normal   Assets  Assets:Communication Skills; Housing; Catering manager; Social Support   Sleep  Sleep:Sleep: Fair Number of Hours of Sleep: 6    Physical Exam: Physical Exam Constitutional:      Appearance: Normal appearance.  Cardiovascular:     Rate and Rhythm: Normal rate.     Pulses: Normal pulses.  Musculoskeletal:        General: Normal range of motion.     Cervical back: Normal range of motion.  Neurological:     General: No focal deficit present.     Mental Status: She is alert and oriented to person, place, and time.  Psychiatric:        Attention and Perception: Attention and perception normal.        Mood and Affect: Mood is anxious.        Speech: Speech normal.        Behavior: Behavior is  cooperative.        Thought Content: Thought content normal.        Cognition and Memory: Memory is impaired (patient has known cognitive impairment).        Judgment: Judgment is impulsive.    Review of Systems  Constitutional: Negative.   Eyes: Negative.   Respiratory: Negative.    Cardiovascular: Negative.   Gastrointestinal: Negative.   Genitourinary: Negative.   Musculoskeletal: Negative.   Skin: Negative.   Neurological: Negative.   Endo/Heme/Allergies: Negative.   Psychiatric/Behavioral:  Negative for hallucinations (family reported hallucinations prior to arrival but patient denies at the time of assessment). The patient is nervous/anxious.    Blood pressure (!) 145/75, pulse 60, temperature 98.2 F (36.8 C), resp. rate 14, SpO2 100 %. There is no height or weight on file to calculate BMI.  Treatment Plan Summary: Ms. Leonides Schanz is an 81 year old female pt who known history for advanced stage dementia.  Head CT completed 05/09/2022 demonstrates atrophy with chronic microvascular ischemic changes this is unchanged from previous CT completed 07/2018.  Unfortunately, this is a progressive dx and pt may not benefit from inpatient admission.  Additionally, there's not acute geriatric psych placement for patients with dementia but she may benefit from memory care unit long term placement of which her family can work with social work to coordinate on an outpatient basis.  Today, patient denies suicidal or homicidal ideations and denies AVH.  She has delusional] thinking but this appears more personally tormenting and not contributing to acute safety concerns.  I have reached out to [facility], LCSW to  request these resource be added to discharge AVS. Additionally, spoke with patient's husband and advised him to follow up with pts PCP to request referrals for psychiatry/neuropsychiatry for eval and med mgmt.   Plan- As per above assessment, there are no current grounds for involuntary  commitment at this time.  At baseline that patient has Dementia so prone to periodic behavioral concerns and impulsivity.  At this time she has delusions that are self tormenting but no acute safety concerns.  Based on this, she would not benefit from psychiatric inpatient.  Pt would be best served at home, in a known environment where there is consistency and familial support.  Safety planning completed with pts spouse.   Disposition: No evidence of imminent risk to self or others at present.   Patient does not meet criteria for psychiatric inpatient admission. Supportive therapy provided about ongoing stressors. Discussed crisis plan, support from social network, calling 911, coming to the Emergency Department, and calling Suicide Hotline.  Above discussed with Dr. Melina Copa, [place]; Shelton Silvas, LCSW and Berkley Harvey, RN all informed of above recommendations and disposition.  This service was provided via telemedicine using a 2-way, interactive audio and video technology.  Names of all persons participating in this telemedicine service and their role in this encounter. Name: Renold Genta Role: Patient  Name: Shelva Majestic Role: Pt spouse  Name: Merlyn Lot Role: PMHNP    Mallie Darting, NP 05/10/2022 1:28 PM

## 2022-05-10 NOTE — ED Notes (Signed)
PT in room 47 for TTS

## 2022-05-10 NOTE — ED Notes (Signed)
TTS consult complete  

## 2022-05-10 NOTE — ED Notes (Signed)
Pt assisted with changing into hospital provided blue paper scrubs. Belongings gathered and secured.

## 2022-05-10 NOTE — Progress Notes (Signed)
CSW spoke with patients children Colletta [place] and Jenny Reichmann. CSW explained in order for patient to be placed in a memory care or long term care facility that patient would need medicaid. Patients children are familiar with [employer] [facility] where they can apply. Patients children were also knowledgeable about if patient brings in too much money a month or has property that she may not qualify for medicaid. Patients children stated that patient does have a house in her name. CSW also told patients children they have the option to pay out of pocket. Patient children stated they did not have the money for that. CSW also told patients children that patient would also need a dementia diagnosis to go to a memory care. Patients children stated that patient has an upcoming appointment with her PCP on Tuesday. CSW told patients children to request a TB test because that will also be needed to get into memory care. CSW informed patients children that [facility] in [place] will take medicaid. CSW also explained if patient gets medicaid that she may qualify for personal care services where someone can come into the home and help with ADL's. Patients son Jenny Reichmann stated they would like to try that first. CSW went to go get patients AVS and adult children left the hospital.

## 2022-05-10 NOTE — ED Provider Notes (Signed)
I was informed that patient was medically cleared by psychiatry.  They are recommending outpatient follow-up with neurology.  I have placed a referral for the patient.  Family is here to pick her up.   Terrilee Files, MD 05/10/22 [phone]

## 2022-05-10 NOTE — ED Notes (Signed)
Pt is calm and agreeable to treatment this AM. Pt was hesitant about morning med admin but husband was able to help explain medical tx to pt. Pt did have some forgetfullness about which meds were being given however pt did take all meds. Pt has had no behavioral abnormalities this AM.

## 2022-05-10 NOTE — BH Assessment (Signed)
Comprehensive Clinical Assessment (CCA) Note  05/10/2022 Phyllis Knight:[id number]  Disposition: Phyllis Georges, NP, recommends overnight observation for safety and stabilization with psych reassessment in the AM. Phyllis Evert, RN, informed of disposition.   The patient demonstrates the following risk factors for suicide: Chronic risk factors for suicide include: psychiatric disorder of dementia . Acute risk factors for suicide include: family or marital conflict. Protective factors for this patient include: positive social support, responsibility to others (children, family), coping skills, and hope for the future. Considering these factors, the overall suicide risk at this point appears to be moderate. Patient is not appropriate for outpatient follow up.  Phyllis Knight is an 81 year old female presenting under IVC due hallucinations, paranoia and threatening family. Suspected history of dementia, as patient has had memory loss issues for approximately a year. Patient denied SI, HI, psychosis and alcohol/drug usage. Patient is accompanied by her husband, Phyllis Knight, patient gave consent for him to remain present during assessment. Before TTS clinician could answer questions, patients continued to say "I am not crazy, I am just tired and want to go home" throughout entire assessment. Per triage note, patient arrived via EMS w/ [facility] for IVC, per IVC paperwork, patient has dementia and alzheimer's and hasn't been taking her medications. Patient having hallucinations, paranoia, threatening family. Per husband patient is not taking her medications. Husband concerned about her mental health and ability to take care of herself.  Denies any history of bipolar or schizophrenia or other mental health issues. Husband does not feel safe with her at home. Husband reported guns in the home. Husband requesting services to help his wife with difficult  behaviors.   Chief Complaint:  Chief Complaint  Patient presents with    IVC   Visit Diagnosis:  Hx of Dementia  CCA Screening, Triage and Referral (STR)  Patient Reported Information How did you hear about [place]? Family/Friend  What Is the Reason for Your Visit/Call Today? IVC, dementia  How Long Has This Been Causing You Problems? 1-6 months  What Do You Feel Would Help You the Most Today? -- (uta)   Have You Recently Had Any Thoughts About Hurting Yourself? No  Are You Planning to Commit Suicide/Harm Yourself At This time? No   Have you Recently Had Thoughts About [place]? No  Are You Planning to Harm Someone at This Time? No  Explanation: No data recorded  Have You Used Any Alcohol or Drugs in the Past 24 Hours? No  How Long Ago Did You Use Drugs or Alcohol? No data recorded What Did You Use and How Much? No data recorded  Do You Currently Have a Therapist/Psychiatrist? No  Name of Therapist/Psychiatrist: No data recorded  Have You Been Recently Discharged From Any Office Practice or Programs? No  Explanation of Discharge From Practice/Program: No data recorded    CCA Screening Triage Referral Assessment Type of Contact: Tele-Assessment  Telemedicine Service Delivery:   Is this Initial or Reassessment? Initial Assessment  Date Telepsych consult ordered in [facility]:  05/09/22  Time Telepsych consult ordered in [facility]:  2231  Location of Assessment: [facility] ED  Provider Location: [facility]   Collateral Involvement: Phyllis Knight, husband   Does Patient Have a Stage manager Guardian? No data recorded Name and Contact of Legal Guardian: No data recorded If Minor and Not Living with Parent(s), Who has Custody? No data recorded Is CPS involved or ever been involved? Never  Is APS involved or ever been  involved? Never   Patient Determined To Be At Risk for Harm To Self or Others Based on Review of Patient Reported Information or Presenting Complaint? No data recorded Method: No data  recorded Availability of Means: No data recorded Intent: No data recorded Notification Required: No data recorded Additional Information for Danger to Others Potential: No data recorded Additional Comments for Danger to Others Potential: No data recorded Are There Guns or Other Weapons in Your Home? No data recorded Types of Guns/Weapons: No data recorded Are These Weapons Safely Secured?                            No data recorded Who Could Verify You Are Able To Have These Secured: No data recorded Do You Have any Outstanding Charges, Pending Court Dates, Parole/Probation? No data recorded Contacted To Inform of Risk of Harm To Self or Others: No data recorded   Does Patient Present under Involuntary Commitment? Yes  IVC Papers Initial File Date: 05/09/22   [place] of Residence: [place]   Patient Currently Receiving the Following Services: Not Receiving Services   Determination of Need: Urgent (48 hours)   Options For Referral: Outpatient Therapy; Medication Management     CCA Biopsychosocial Patient Reported Schizophrenia/Schizoaffective Diagnosis in Past: No data recorded  Strengths: uta   Mental Health Symptoms Depression:   Sleep (too much or little) (uta)   Duration of Depressive symptoms:  Duration of Depressive Symptoms: Greater than two weeks   Mania:   None   Anxiety:    Worrying; Tension; Sleep; Restlessness; Irritability   Psychosis:   None   Duration of Psychotic symptoms:    Trauma:   None   Obsessions:   None   Compulsions:   None   Inattention:   None   Hyperactivity/Impulsivity:   None   Oppositional/Defiant Behaviors:   None   Emotional Irregularity:   None   Other Mood/Personality Symptoms:  No data recorded   Mental Status Exam Appearance and self-care  Stature:   Average   Weight:   Average weight   Clothing:   Neat/clean   Grooming:   Normal   Cosmetic use:   None   Posture/gait:   Other (Comment)  (patient in bed)   Motor activity:  No data recorded  Sensorium  Attention:   Distractible; Confused   Concentration:   Anxiety interferes   Orientation:   Place; Person   Recall/memory:   Defective in Immediate; Defective in Short-term   Affect and Mood  Affect:   Anxious   Mood:   Anxious   Relating  Eye contact:   Normal   Facial expression:   Anxious; Tense; Responsive; Sad; Fearful   Attitude toward examiner:   Argumentative   Thought and Language  Speech flow:  Normal   Thought content:   -- Pincus Badder)   Preoccupation:   None   Hallucinations:   -- Pincus Badder)   Organization:  No data recorded  [employer] of Knowledge:   Average   Intelligence:   Average   Abstraction:   -- Pincus Badder)   Judgement:   Poor   Reality Testing:   Distorted   Insight:   Lacking   Decision Making:   Confused   Social Functioning  Social Maturity:   -- Pincus Badder)   Social Judgement:   -- Pincus Badder)   Stress  Stressors:   Other (Comment) (uta)   Coping Ability:   -- (  Rich Reining)   Skill Deficits:   Decision making; Self-control; Self-care; Communication; Activities of daily living   Supports:   Family     Religion: Religion/Spirituality Are You A Religious Person?:  Rich Reining)  Leisure/Recreation: Leisure / Recreation Do You Have Hobbies?:  Rich Reining)  Exercise/Diet: Exercise/Diet Do You Exercise?:  (uta) Do You Follow a Special Diet?:  (uta) Do You Have Any Trouble Sleeping?: Yes Explanation of Sleeping Difficulties: "poor toss and turn all night"   CCA Employment/Education Employment/Work Situation: Employment / Work Situation Employment Situation: Retired  Education: Education Is Patient Currently Attending School?: No Did Theme park manager?: No   CCA Family/Childhood History Family and Relationship History: Family history Marital status: Married Number of Years Married: 15 What types of issues is patient dealing with in the  relationship?: marital conflict dealing with patients mental health Does patient have children?: Yes How many children?: 4 How is patient's relationship with their children?: good  Childhood History:  Childhood History Did patient suffer any verbal/emotional/physical/sexual abuse as a child?: No Did patient suffer from severe childhood neglect?: No Has patient ever been sexually abused/assaulted/raped as an adolescent or adult?: No Was the patient ever a victim of a crime or a disaster?:  (uta) Witnessed domestic violence?:  (uta) Has patient been affected by domestic violence as an adult?:  Industrial/product designer)  Child/Adolescent Assessment:     CCA Substance Use Alcohol/Drug Use: Alcohol / Drug Use Pain Medications: see MAR Prescriptions: see MAR Over the Counter: see MAR History of alcohol / drug use?: No history of alcohol / drug abuse                         ASAM's:  Six Dimensions of Multidimensional Assessment  Dimension 1:  Acute Intoxication and/or Withdrawal Potential:      Dimension 2:  Biomedical Conditions and Complications:      Dimension 3:  Emotional, Behavioral, or Cognitive Conditions and Complications:     Dimension 4:  Readiness to Change:     Dimension 5:  Relapse, Continued use, or Continued Problem Potential:     Dimension 6:  Recovery/Living Environment:     ASAM Severity Score:    ASAM Recommended Level of Treatment:     Substance use Disorder (SUD)    Recommendations for Services/Supports/Treatments: Recommendations for Services/Supports/Treatments Recommendations For Services/Supports/Treatments: Medication Management, Individual Therapy  Discharge Disposition:    DSM5 Diagnoses: Patient Active Problem List   Diagnosis Date Noted   PVC (premature ventricular contraction) 09/12/2021   PSVT (paroxysmal supraventricular tachycardia) (HCC) 09/12/2021   Precordial pain 03/21/2021   Monitoring for long-term anticoagulant use 06/30/2020    Dizziness 04/13/2020   Mixed hyperlipidemia 04/13/2020   Leg edema 12/15/2019   Atypical chest pain 04/28/2019   Palpitations 04/28/2019   Influenza 02/14/2019   Paroxysmal A-fib (HCC) 02/14/2019   Neutropenia (HCC) 02/14/2019   Essential hypertension 07/17/2018   Fibromyalgia 07/17/2018   Pain in right knee 07/17/2018   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

## 2022-05-10 NOTE — ED Notes (Signed)
Pt set up with TTS

## 2022-05-10 NOTE — ED Notes (Signed)
All belongings, 2 jackets, socks, necklace and multiple rings given to spouse. Spouse, and pt verify all belongings are in his possession.

## 2022-05-10 NOTE — Progress Notes (Signed)
CSW added resources to pt's AVS as requested by provider Arvilla Market, NP.  Maryjean Ka, MSW, [facility] 05/10/2022 3:33 PM

## 2022-05-14 DIAGNOSIS — R413 Other amnesia: Secondary | ICD-10-CM | POA: Diagnosis not present

## 2022-05-14 DIAGNOSIS — I10 Essential (primary) hypertension: Secondary | ICD-10-CM | POA: Diagnosis not present

## 2022-05-14 DIAGNOSIS — K219 Gastro-esophageal reflux disease without esophagitis: Secondary | ICD-10-CM | POA: Diagnosis not present

## 2022-05-14 DIAGNOSIS — M797 Fibromyalgia: Secondary | ICD-10-CM | POA: Diagnosis not present

## 2022-05-14 DIAGNOSIS — E7800 Pure hypercholesterolemia, unspecified: Secondary | ICD-10-CM | POA: Diagnosis not present

## 2022-05-20 DIAGNOSIS — Z7189 Other specified counseling: Secondary | ICD-10-CM | POA: Diagnosis not present

## 2022-05-20 DIAGNOSIS — R413 Other amnesia: Secondary | ICD-10-CM | POA: Diagnosis not present

## 2022-05-30 ENCOUNTER — Ambulatory Visit: Payer: Medicare Other | Admitting: Neurology

## 2022-06-07 DIAGNOSIS — I10 Essential (primary) hypertension: Secondary | ICD-10-CM | POA: Diagnosis not present

## 2022-06-10 ENCOUNTER — Ambulatory Visit: Payer: Medicare Other | Admitting: Neurology

## 2022-06-10 ENCOUNTER — Encounter: Payer: Self-pay | Admitting: Neurology

## 2022-07-08 DIAGNOSIS — I10 Essential (primary) hypertension: Secondary | ICD-10-CM | POA: Diagnosis not present

## 2022-07-16 ENCOUNTER — Encounter: Payer: Self-pay | Admitting: Cardiology

## 2022-07-16 DIAGNOSIS — J069 Acute upper respiratory infection, unspecified: Secondary | ICD-10-CM | POA: Diagnosis not present

## 2022-07-16 DIAGNOSIS — R058 Other specified cough: Secondary | ICD-10-CM | POA: Diagnosis not present

## 2022-07-19 ENCOUNTER — Ambulatory Visit: Payer: Medicare Other | Admitting: Cardiology

## 2022-08-07 DIAGNOSIS — I10 Essential (primary) hypertension: Secondary | ICD-10-CM | POA: Diagnosis not present

## 2022-08-08 ENCOUNTER — Encounter: Payer: Self-pay | Admitting: Cardiology

## 2022-08-08 ENCOUNTER — Ambulatory Visit: Payer: Medicare Other | Admitting: Cardiology

## 2022-08-08 VITALS — BP 135/83 | HR 71 | Temp 98.7°F | Resp 16 | Ht 70.0 in | Wt 211.2 lb

## 2022-08-08 DIAGNOSIS — E782 Mixed hyperlipidemia: Secondary | ICD-10-CM | POA: Diagnosis not present

## 2022-08-08 DIAGNOSIS — I480 Paroxysmal atrial fibrillation: Secondary | ICD-10-CM | POA: Diagnosis not present

## 2022-08-08 DIAGNOSIS — I10 Essential (primary) hypertension: Secondary | ICD-10-CM | POA: Diagnosis not present

## 2022-08-08 NOTE — Progress Notes (Signed)
Follow up visit  Subjective:   Phyllis Knight, female    DOB: 04-16-1941, 81 y.o.   MRN: [id number]    HPI   Chief Complaint  Patient presents with   Atrial Fibrillation   Hypertension   Follow-up    6 months    81 y.o. African American female  with hypertension, fibromyalgia, paroxysmal Afib  Patient denies chest pain, shortness of breath, palpitations, leg edema, orthopnea, PND, TIA/syncope. She has had "cold symptoms", was reportedly tested negative for COVID and flu.     Current Outpatient Medications:    amLODipine (NORVASC) 10 MG tablet, Take 10 mg by mouth daily., Disp: , Rfl:    apixaban (ELIQUIS) 5 MG TABS tablet, Take 1 tablet by mouth 2 (two) times daily., Disp: , Rfl:    cetirizine (ZYRTEC) 10 MG tablet, Take 10 mg by mouth daily., Disp: , Rfl:    cholecalciferol (VITAMIN D) 1000 units tablet, Take 2,000 Units by mouth daily. , Disp: , Rfl:    diltiazem (CARDIZEM) 30 MG tablet, TAKE 1 TABLET BY MOUTH 3  TIMES DAILY AS NEEDED (Patient taking differently: Take 30 mg by mouth 3 (three) times daily as needed (increased heart rate).), Disp: 270 tablet, Rfl: 3   furosemide (LASIX) 20 MG tablet, Take 1 tablet (20 mg total) by mouth daily. (Patient taking differently: Take 10 mg by mouth daily.), Disp: 90 tablet, Rfl: 2   metoprolol succinate (TOPROL-XL) 50 MG 24 hr tablet, TAKE 1 TABLET BY MOUTH  DAILY WITH OR IMMEDIATELY  FOLLOWING A MEAL (Patient taking differently: Take 50 mg by mouth daily.), Disp: 30 tablet, Rfl: 6   pantoprazole (PROTONIX) 40 MG tablet, Take 40 mg by mouth daily. , Disp: , Rfl:    potassium chloride (KLOR-CON) 8 MEQ tablet, Take 8 mEq by mouth daily. , Disp: , Rfl:    rosuvastatin (CRESTOR) 20 MG tablet, Take 1 tablet (20 mg total) by mouth daily., Disp: 90 tablet, Rfl: 3   traMADol-acetaminophen (ULTRACET) 37.5-325 MG tablet, Take 1 tablet by mouth every 6 (six) hours as needed (soft tissue and muscle pain)., Disp: , Rfl:   Cardiovascular & other  pertient studies:  EKG 08/08/2022: Sinus rhythm 68 bpm Possible old anteroseptal infarct  Mobile cardiac telemetry 7 days 03/21/2021 - 03/29/2021: Dominant rhythm: Sinus. HR 43-90 bpm. Avg HR 61 bpm, in sinus rhythm. 282 episodes of SVT, fastest at 164 bpm for 6 beats, longest for 17.4 secs at 112 bpm. <1% isolated SVE, couplet/triplets. <1% isolated VE, couplets. No atrial fibrillation/atrial flutter/VT/high grade AV block, sinus pause >3sec noted. 0 patient triggered events.   Lexiscan Myoview stress test 06/07/2019: Lexiscan stress test was performed. Stress EKG is non-diagnostic, as this is pharmacological stress test. In addition, Rest and stress EKG reveal sinus rhythm, frequent PVC's and occasional PAC's.  SPECT images revealed small sized, mildly reversible, mild intensity perfusion defect in basal inferior myocardium. While breast attenuation is possible (imaging performed in sitting position), small area of ischemia cannot be excluded. LVEF 73% with normal wall motion. Low risk study.   Event monitor 04/28/2019 - 05/11/2019:  1 auto triggered event shows sinus rhythm. No patient triggered events seen. No Afib.    Echocardiogram 02/16/2019:  1. The left ventricle has normal systolic function with an ejection fraction of 60-65%. The cavity size was normal. There is mild concentric left ventricular hypertrophy. Diastolic function could not be assessed due to atrial fibrillation.  2. Low normal RV systolic function.  3. Mild left  atrial dilatation. Moderate right atrial dilatation.  4. Mild to moderate mitral and tricuspid regurgitation. RVSP 29 mmHg.  5. Vavlular regurgitation and chamber dilatation new since previous echocardiogram on 07/17/2018.   Recent labs: 05/09/2022: Glucose 83, BUN/Cr 11/0.6. EGFR >60. Na/K 140/3.7. Rest of the CMP normal H/H 14/41. MCV 95. Platelets 215  11/07/2021: Glucose 80, BUN/Cr 10/0.53. EGFR 93. Na/K 140/4.2. Rest of the CMP normal H/H  13/39. Chol 152, TG 83, HDL 50, LDL 86 TSH 1.4   Review of Systems  Cardiovascular:  Negative for chest pain, dyspnea on exertion, leg swelling, palpitations and syncope.  Musculoskeletal:  Positive for joint pain.          Vitals:   08/08/22 1409  BP: 135/83  Pulse: 71  Resp: 16  Temp: 98.7 F (37.1 C)  SpO2: 99%      Body mass index is 30.3 kg/m. Filed Weights   08/08/22 1409  Weight: 211 lb 3.2 oz (95.8 kg)     Objective:  Physical Exam Vitals and nursing note reviewed.  Constitutional:      General: She is not in acute distress. Neck:     Vascular: No JVD.  Cardiovascular:     Rate and Rhythm: Normal rate and regular rhythm.     Heart sounds: Normal heart sounds. No murmur heard. Pulmonary:     Effort: Pulmonary effort is normal.     Breath sounds: Normal breath sounds. No wheezing or rales.         Assessment & Recommendations:   80 y.o. African American female  with hypertension, fibromyalgia, paroxysmal Afib, PSVT, PVC  Hypertension: Controlled.  Paroxysmal Afib:  CHA2DS2VASc score 4. Annual stroke risk 5%. Continue Eliquis 5 mg twice daily.   Hyperlipidemia: Currently on Crestor 20 mg daily. Check lipid panel.  F/u in 6 months    J , PA-C 08/08/2022, 2:43 PM Office: [phone]  

## 2022-08-18 ENCOUNTER — Other Ambulatory Visit: Payer: Self-pay | Admitting: Cardiology

## 2022-08-18 DIAGNOSIS — E782 Mixed hyperlipidemia: Secondary | ICD-10-CM

## 2022-08-21 ENCOUNTER — Other Ambulatory Visit: Payer: Self-pay

## 2022-08-21 MED ORDER — AMLODIPINE BESYLATE 10 MG PO TABS: 10.0000 mg | ORAL_TABLET | Freq: Every day | ORAL | 3 refills | Status: DC

## 2022-09-06 DIAGNOSIS — I10 Essential (primary) hypertension: Secondary | ICD-10-CM | POA: Diagnosis not present

## 2022-10-07 DIAGNOSIS — I10 Essential (primary) hypertension: Secondary | ICD-10-CM | POA: Diagnosis not present

## 2022-10-28 ENCOUNTER — Telehealth: Payer: Self-pay

## 2022-10-28 DIAGNOSIS — I10 Essential (primary) hypertension: Secondary | ICD-10-CM

## 2022-10-28 DIAGNOSIS — R030 Elevated blood-pressure reading, without diagnosis of hypertension: Secondary | ICD-10-CM

## 2022-10-28 NOTE — Telephone Encounter (Signed)
ON-CALL CARDIOLOGY 10/28/22  Patient's name: Phyllis Knight.   MRN: [id number].    DOB: 1941-08-03 Primary care provider: Pcp, No. Primary cardiologist: Truett Mainland, MD, [facility]  Chief Complaint  Patient presents with   Hypertension    Interaction regarding this patient's care today: Received page from patient regarding elevated blood pressure reading of 133/100 and heart rate 60. Upon return call patient stated that she has been having significant life stressors over the past 2 weeks and was "worked up" and felt this was contributing to her elevated blood pressure. She rechecked her blood pressure and it was 162/97. She denies chest pain, shortness of breath, palpitations, or dizziness.  Impression:   ICD-10-CM   1. Elevated blood pressure reading  R03.0     2. Essential hypertension  I10       No orders of the defined types were placed in this encounter.   No orders of the defined types were placed in this encounter.   Recommendations: She is currently on max daily dose of amlodipine therefore she was instructed to take diltiazem 30mg  one dose and recheck blood pressure and heart rate in 1 hour and call if blood pressure was >150/90. Patient again paged and stated her blood pressure machine was not working and reading low. Advised patient to change batteries and she did not have batteries. She is still asymptomatic therefore, advised patient to come into office tomorrow for blood pressure check and machine check. Patient also advised to call back if she develops new symptoms or concerns.  Telephone encounter total time: 15 minutes    , Nori Riis Pager: [phone] Office: [phone]

## 2022-10-29 ENCOUNTER — Ambulatory Visit: Payer: Medicare Other

## 2022-10-29 NOTE — Progress Notes (Signed)
Home blood pressure monitor replaced. Blood pressure is under good control, no changes to current medications. Follow-up at next scheduled OV.

## 2022-10-31 ENCOUNTER — Telehealth: Payer: Self-pay

## 2022-10-31 NOTE — Telephone Encounter (Signed)
Patient calling with complaints of high blood pressure. She said she is "aggravated and worried" and it is causing her BP to increase. After discussing with Nori Riis, NP, I let the patient know to try and relax and take some deep breaths. Then, after an hour she will retake her BP. If BP is still elevated she is to take a Diltiazem, and reassess BP in two hours AFTER taking the medication. Answered patients questions to clarify instructions and she acknowledged understanding. She will call back should she have any more questions or concerns.

## 2022-11-06 DIAGNOSIS — I10 Essential (primary) hypertension: Secondary | ICD-10-CM | POA: Diagnosis not present

## 2022-11-11 ENCOUNTER — Other Ambulatory Visit: Payer: Self-pay | Admitting: Cardiology

## 2022-11-27 ENCOUNTER — Telehealth: Payer: Self-pay

## 2022-11-27 NOTE — Telephone Encounter (Signed)
Patient was having chills but is feeling better now.

## 2022-11-27 NOTE — Telephone Encounter (Signed)
Patient left message with answering service. LMOVM asking patient to call back if she still had questions.

## 2022-12-07 DIAGNOSIS — I10 Essential (primary) hypertension: Secondary | ICD-10-CM | POA: Diagnosis not present

## 2022-12-12 DIAGNOSIS — R69 Illness, unspecified: Secondary | ICD-10-CM | POA: Diagnosis not present

## 2022-12-12 DIAGNOSIS — R413 Other amnesia: Secondary | ICD-10-CM | POA: Diagnosis not present

## 2022-12-12 DIAGNOSIS — Z0000 Encounter for general adult medical examination without abnormal findings: Secondary | ICD-10-CM | POA: Diagnosis not present

## 2022-12-12 DIAGNOSIS — D6869 Other thrombophilia: Secondary | ICD-10-CM | POA: Diagnosis not present

## 2022-12-12 DIAGNOSIS — I10 Essential (primary) hypertension: Secondary | ICD-10-CM | POA: Diagnosis not present

## 2022-12-12 DIAGNOSIS — Z23 Encounter for immunization: Secondary | ICD-10-CM | POA: Diagnosis not present

## 2022-12-12 DIAGNOSIS — K219 Gastro-esophageal reflux disease without esophagitis: Secondary | ICD-10-CM | POA: Diagnosis not present

## 2022-12-12 DIAGNOSIS — E7800 Pure hypercholesterolemia, unspecified: Secondary | ICD-10-CM | POA: Diagnosis not present

## 2022-12-12 DIAGNOSIS — M797 Fibromyalgia: Secondary | ICD-10-CM | POA: Diagnosis not present

## 2022-12-12 DIAGNOSIS — F322 Major depressive disorder, single episode, severe without psychotic features: Secondary | ICD-10-CM | POA: Diagnosis not present

## 2022-12-12 DIAGNOSIS — Z1331 Encounter for screening for depression: Secondary | ICD-10-CM | POA: Diagnosis not present

## 2023-01-07 DIAGNOSIS — I10 Essential (primary) hypertension: Secondary | ICD-10-CM | POA: Diagnosis not present

## 2023-01-19 ENCOUNTER — Other Ambulatory Visit: Payer: Self-pay

## 2023-01-19 ENCOUNTER — Emergency Department (HOSPITAL_COMMUNITY): Payer: Medicare HMO

## 2023-01-19 ENCOUNTER — Emergency Department (HOSPITAL_COMMUNITY)
Admission: EM | Admit: 2023-01-19 | Discharge: 2023-01-19 | Disposition: A | Discharge status: Home / Self Care | Payer: Medicare HMO | Attending: Emergency Medicine | Admitting: Emergency Medicine

## 2023-01-19 DIAGNOSIS — R072 Precordial pain: Secondary | ICD-10-CM | POA: Diagnosis not present

## 2023-01-19 DIAGNOSIS — R0789 Other chest pain: Secondary | ICD-10-CM | POA: Diagnosis not present

## 2023-01-19 DIAGNOSIS — Z7901 Long term (current) use of anticoagulants: Secondary | ICD-10-CM | POA: Insufficient documentation

## 2023-01-19 DIAGNOSIS — R002 Palpitations: Secondary | ICD-10-CM

## 2023-01-19 DIAGNOSIS — Z79899 Other long term (current) drug therapy: Secondary | ICD-10-CM | POA: Insufficient documentation

## 2023-01-19 DIAGNOSIS — R079 Chest pain, unspecified: Secondary | ICD-10-CM | POA: Diagnosis not present

## 2023-01-19 DIAGNOSIS — I10 Essential (primary) hypertension: Secondary | ICD-10-CM | POA: Insufficient documentation

## 2023-01-19 LAB — CBC
HCT: 39 % (ref 36.0–46.0)
Hemoglobin: 13.4 g/dL (ref 12.0–15.0)
MCH: 32.4 pg (ref 26.0–34.0)
MCHC: 34.4 g/dL (ref 30.0–36.0)
MCV: 94.4 fL (ref 80.0–100.0)
Platelets: 197 10*3/uL (ref 150–400)
RBC: 4.13 MIL/uL (ref 3.87–5.11)
RDW: 12.4 % (ref 11.5–15.5)
WBC: 4.9 10*3/uL (ref 4.0–10.5)
nRBC: 0 % (ref 0.0–0.2)

## 2023-01-19 LAB — BASIC METABOLIC PANEL
Anion gap: 6 (ref 5–15)
BUN: 11 mg/dL (ref 8–23)
CO2: 27 mmol/L (ref 22–32)
Calcium: 8.9 mg/dL (ref 8.9–10.3)
Chloride: 104 mmol/L (ref 98–111)
Creatinine, Ser: 0.72 mg/dL (ref 0.44–1.00)
GFR, Estimated: >60 mL/min (ref >60)
Glucose, Bld: 97 mg/dL (ref 70–99)
Potassium: 3.7 mmol/L (ref 3.5–5.1)
Sodium: 137 mmol/L (ref 135–145)

## 2023-01-19 LAB — TROPONIN I (HIGH SENSITIVITY)
Troponin I (High Sensitivity): 3 ng/L (ref <18)
Troponin I (High Sensitivity): 4 ng/L (ref <18)

## 2023-01-19 NOTE — ED Triage Notes (Signed)
Patient reports central chest pain with SOB and nausea this evening .

## 2023-01-19 NOTE — ED Notes (Addendum)
Pt to room from waiting room. Pt complains of chest pain . Pt hooked up to monitors. Vitals stable. Call bell within reach. No other acute distress noted at this time. Family @ bedside

## 2023-01-19 NOTE — ED Notes (Signed)
Pt back from x-ray, hooked to purewick, denies pain at this time. Call bell in reach.

## 2023-01-19 NOTE — ED Notes (Addendum)
Patient transported to XRAY 

## 2023-01-19 NOTE — ED Notes (Signed)
Pt purewick changed per request of pt. Cleaned pt. No acute distress noted, denies pain, call bell in reach.

## 2023-01-19 NOTE — ED Provider Notes (Signed)
[facility] Provider Note   CSN: [id number] Arrival date & time: 01/19/23  0141     History  Chief Complaint  Patient presents with   Chest Pain    Ellnora Rockmore Jeske is a 82 y.o. female.  The history is provided by the patient and the spouse.  Patient with history of atrial fibrillation, fibromyalgia presents with reports that her heart was beating fast and then stopped.  She reports while she was lying in bed she felt that her heart was racing at times and then felt like it was slowing down.  No syncope.  She reports mild chest pressure but that has resolved.  She has had shortness of breath.  No fevers or vomiting.  She had otherwise been at her baseline.  She is on anticoagulation for A-fib and has been compliant. No recent chest pain episodes prior to tonight.    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   GERD (gastroesophageal reflux disease)    H/O hiatal hernia    Hearing loss    left ear    History of uterine cancer 1975   s/p hysterectomy   Hyperlipidemia    Hypertension    Numbness and tingling    hands and feet bilat    Pneumonia yrs ago   Shortness of breath dyspnea    Syncope 07/16/2018   Tinnitus    Urge urinary incontinence    Urinary incontinence    Vitamin D deficiency     Home Medications Prior to Admission medications   Medication Sig Start Date End Date Taking? Authorizing Provider  amLODipine (NORVASC) 10 MG tablet Take 1 tablet (10 mg total) by mouth daily. 08/21/22  Yes Patwardhan, Manish J, MD  cetirizine (ZYRTEC) 10 MG tablet Take 10 mg by mouth daily as needed for rhinitis.   Yes [provider]  cholecalciferol (VITAMIN  D) 1000 units tablet Take 2,000 Units by mouth daily.    Yes [provider]  diltiazem (CARDIZEM) 30 MG tablet TAKE 1 TABLET BY MOUTH 3  TIMES DAILY AS NEEDED Patient taking differently: Take 30 mg by mouth 3 (three) times daily as needed (increased heart rate). 11/13/21  Yes Patwardhan, Manish J, MD  ELIQUIS 5 MG TABS tablet TAKE 1 TABLET BY MOUTH TWICE  DAILY 11/12/22  Yes Patwardhan, Manish J, MD  furosemide (LASIX) 20 MG tablet Take 1 tablet (20 mg total) by mouth daily. Patient taking differently: Take 10 mg by mouth daily. 06/20/20  Yes Patwardhan, Manish J, MD  metoprolol succinate (TOPROL-XL) 50 MG 24 hr tablet TAKE 1 TABLET BY MOUTH  DAILY WITH OR IMMEDIATELY  FOLLOWING A MEAL Patient taking differently: Take 50 mg by mouth daily. 04/18/20  Yes Patwardhan, Manish J, MD  pantoprazole (PROTONIX) 40 MG tablet Take 40 mg by mouth daily as needed (for acid reflux). 01/26/19  Yes [provider]  potassium chloride (KLOR-CON) 8 MEQ tablet Take 8 mEq by mouth daily.  04/17/15  Yes [provider]  rosuvastatin (CRESTOR) 20 MG tablet TAKE 1 TABLET BY MOUTH  DAILY 08/19/22  Yes Patwardhan, Reynold Bowen, MD  traMADol-acetaminophen (ULTRACET) 37.5-325 MG tablet Take 1 tablet by mouth every 6 (six) hours as needed (soft tissue and muscle pain). 08/04/20  Yes [provider]      Allergies    Duloxetine hcl, Cefuroxime, Codeine, and Zofran [ondansetron hcl]    Review of Systems   Review of Systems  Constitutional:  Negative for fever.  Neurological:  Negative for syncope.    Physical Exam Updated Vital Signs BP (!) 166/84   Pulse (!) 52   Temp (!) 97.5 F (36.4 C) (Oral)   Resp 14   SpO2 99%  Physical Exam CONSTITUTIONAL: Elderly, no acute distress HEAD: Normocephalic/atraumatic EYES: EOMI/PERRL ENMT: Mucous membranes moist NECK: supple no meningeal signs CV: S1/S2 noted, no murmurs/rubs/gallops noted LUNGS: Lungs are clear to auscultation bilaterally, no  apparent distress ABDOMEN: soft, nontender NEURO: Pt is awake/alert/appropriate, moves all extremitiesx4.  No facial droop.  No arm or leg drift EXTREMITIES: pulses normal/equal, full ROM, chronic edema of the lower extremity SKIN: warm, color normal PSYCH: no abnormalities of mood noted, alert and oriented to situation  ED Results / Procedures / Treatments   Labs (all labs ordered are listed, but only abnormal results are displayed) Labs Reviewed  BASIC METABOLIC PANEL  CBC  TROPONIN I (HIGH SENSITIVITY)  TROPONIN I (HIGH SENSITIVITY)    EKG EKG Interpretation  Date/Time:  Sunday January 19 2023 01:44:37 EST Ventricular Rate:  58 PR Interval:  184 QRS Duration: 92 QT Interval:  406 QTC Calculation: 398 R Axis:   1 Text Interpretation: Sinus bradycardia with sinus arrhythmia Moderate voltage criteria for LVH, may be normal variant ( R in aVL , Cornell product ) Abnormal ekg Interpretation limited secondary to artifact Confirmed by Ripley Fraise [phone]) on 01/19/2023 2:00:30 AM  Radiology DG Chest 2 View  Result Date: 01/19/2023 CLINICAL DATA:  Chest pain. EXAM: CHEST - 2 VIEW COMPARISON:  11/10/2020. FINDINGS: The heart size and mediastinal contours are within normal limits. There is atherosclerotic calcification of the aorta. Interstitial prominence is noted at the lung bases and not significantly changed from the prior exam. No consolidation, effusion, or pneumothorax. Degenerative changes are present in the thoracic spine. IMPRESSION: No active cardiopulmonary disease. Electronically Signed   By: Brett Fairy M.D.   On: 01/19/2023 02:42    Procedures Procedures    Medications Ordered in ED Medications - No data to display  ED Course/ Medical Decision Making/ A&P Clinical Course as of 01/19/23 [id number]  Sun Jan 19, 2023  0318 Patient very well-appearing.  Initially had reported chest pain, but on my evaluation she said it was mostly due to feeling her heart racing and  then "slowing down and stopping" At this time she is regular rhythm with sinus bradycardia.  She is med compliant with her Eliquis.  It is possible she has had runs of A-fib with RVR, but she has been known to have PVCs as well.  Plan to monitor in the ER for several hours. [DW]  [id number] Patient stable, no new complaints.  Watching television.  Monitor reveals sinus bradycardia in the 50s.  Awaiting repeat troponin [DW]  0605 Patient continues to rest comfortably.  Rechecked telemetry monitoring, no acute issues.  No AV block, no dysrhythmias.  Troponin negative.  No new chest pain.  I have low suspicion for ACS/PE/dissection.  Patient is able to ambulate.  Advised her to call her cardiologist tomorrow [DW]    Clinical Course User Index [DW]  Ripley Fraise, MD                             Medical Decision Making Amount and/or Complexity of Data Reviewed Labs: ordered. Radiology: ordered.   This patient presents to the ED for concern of chest pain/palpitations, this involves an extensive number of treatment options, and is a complaint that carries with it a high risk of complications and morbidity.  The differential diagnosis includes but is not limited to acute coronary syndrome, aortic dissection, pulmonary embolism, pericarditis, pneumothorax, pneumonia, myocarditis, pleurisy, esophageal rupture, atrial fibrillation with RVR    Comorbidities that complicate the patient evaluation: Patient's presentation is complicated by their history of A-fib, hypertension, hyperlipidemia   Additional history obtained: Additional history obtained from spouse Records reviewed  cardiology records reviewed patient had previous low risk stress imaging  Lab Tests: I Ordered, and personally interpreted labs.  The pertinent results include: Labs overall unremarkable  Imaging Studies ordered: I ordered imaging studies including X-ray chest   I independently visualized and interpreted imaging which showed no  acute findings I agree with the radiologist interpretation  Cardiac Monitoring: The patient was maintained on a cardiac monitor.  I personally viewed and interpreted the cardiac monitor which showed an underlying rhythm of:  sinus bradycardia  Test Considered: I considered admission and cardiology consultation, but patient stable in the ER without any acute findings   Reevaluation: After the interventions noted above, I reevaluated the patient and found that they have :improved  Complexity of problems addressed: Patient's presentation is most consistent with  acute presentation with potential threat to life or bodily function  Disposition: After consideration of the diagnostic results and the patient's response to treatment,  I feel that the patent would benefit from discharge   .           Final Clinical Impression(s) / ED Diagnoses Final diagnoses:  Palpitations  Precordial pain    Rx / DC Orders ED Discharge Orders     None         Ripley Fraise, MD 01/19/23 [phone]

## 2023-01-24 ENCOUNTER — Other Ambulatory Visit: Payer: Medicare HMO

## 2023-01-24 ENCOUNTER — Ambulatory Visit: Payer: Medicare HMO | Admitting: Cardiology

## 2023-01-24 ENCOUNTER — Encounter: Payer: Self-pay | Admitting: Cardiology

## 2023-01-24 ENCOUNTER — Telehealth: Payer: Self-pay

## 2023-01-24 VITALS — BP 134/78 | HR 58 | Resp 16 | Ht 70.0 in | Wt 208.0 lb

## 2023-01-24 DIAGNOSIS — E782 Mixed hyperlipidemia: Secondary | ICD-10-CM

## 2023-01-24 DIAGNOSIS — I480 Paroxysmal atrial fibrillation: Secondary | ICD-10-CM

## 2023-01-24 DIAGNOSIS — I10 Essential (primary) hypertension: Secondary | ICD-10-CM | POA: Diagnosis not present

## 2023-01-24 NOTE — Progress Notes (Signed)
Follow up visit  Subjective:   Phyllis Knight, female    DOB: 10-20-1941, 82 y.o.   MRN: [id number]    HPI   Chief Complaint  Patient presents with   Atrial Fibrillation   Follow-up    82 y.o. African American female  with hypertension, fibromyalgia, paroxysmal Afib  Patient was recently seen in [facility], ER with complaints of rapid heartbeat.  She was found to be in sinus rhythm while in the ER.  She reported some chest pain with palpitations, but ACS was excluded with normal troponins.   Current Outpatient Medications:    amLODipine (NORVASC) 10 MG tablet, Take 1 tablet (10 mg total) by mouth daily., Disp: 90 tablet, Rfl: 3   cetirizine (ZYRTEC) 10 MG tablet, Take 10 mg by mouth daily as needed for rhinitis., Disp: , Rfl:    cholecalciferol (VITAMIN D) 1000 units tablet, Take 2,000 Units by mouth daily. , Disp: , Rfl:    diltiazem (CARDIZEM) 30 MG tablet, TAKE 1 TABLET BY MOUTH 3  TIMES DAILY AS NEEDED (Patient taking differently: Take 30 mg by mouth 3 (three) times daily as needed (increased heart rate).), Disp: 270 tablet, Rfl: 3   ELIQUIS 5 MG TABS tablet, TAKE 1 TABLET BY MOUTH TWICE  DAILY, Disp: 180 tablet, Rfl: 3   furosemide (LASIX) 20 MG tablet, Take 1 tablet (20 mg total) by mouth daily. (Patient taking differently: Take 10 mg by mouth daily.), Disp: 90 tablet, Rfl: 2   metoprolol succinate (TOPROL-XL) 50 MG 24 hr tablet, TAKE 1 TABLET BY MOUTH  DAILY WITH OR IMMEDIATELY  FOLLOWING A MEAL (Patient taking differently: Take 50 mg by mouth daily.), Disp: 30 tablet, Rfl: 6   pantoprazole (PROTONIX) 40 MG tablet, Take 40 mg by mouth daily as needed (for acid reflux)., Disp: , Rfl:    potassium chloride (KLOR-CON) 8 MEQ tablet, Take 8 mEq by mouth daily. , Disp: , Rfl:    rosuvastatin (CRESTOR) 20 MG tablet, TAKE 1 TABLET BY MOUTH DAILY, Disp: 100 tablet, Rfl: 2   traMADol-acetaminophen (ULTRACET) 37.5-325 MG tablet, Take 1 tablet by mouth every 6 (six) hours as needed  (soft tissue and muscle pain)., Disp: , Rfl:   Cardiovascular & other pertient studies:  EKG 11/20/2023:  Sinus rhythm 58 bpm w/sinus arrhtymia LVH  Mobile cardiac telemetry 7 days 03/21/2021 - 03/29/2021: Dominant rhythm: Sinus. HR 43-90 bpm. Avg HR 61 bpm, in sinus rhythm. 282 episodes of SVT, fastest at 164 bpm for 6 beats, longest for 17.4 secs at 112 bpm. <1% isolated SVE, couplet/triplets. <1% isolated VE, couplets. No atrial fibrillation/atrial flutter/VT/high grade AV block, sinus pause >3sec noted. 0 patient triggered events.   Lexiscan Myoview stress test 06/07/2019: Lexiscan stress test was performed. Stress EKG is non-diagnostic, as this is pharmacological stress test. In addition, Rest and stress EKG reveal sinus rhythm, frequent PVC's and occasional PAC's.  SPECT images revealed small sized, mildly reversible, mild intensity perfusion defect in basal inferior myocardium. While breast attenuation is possible (imaging performed in sitting position), small area of ischemia cannot be excluded. LVEF 73% with normal wall motion. Low risk study.   Event monitor 04/28/2019 - 05/11/2019:  1 auto triggered event shows sinus rhythm. No patient triggered events seen. No Afib.    Echocardiogram 02/16/2019:  1. The left ventricle has normal systolic function with an ejection fraction of 60-65%. The cavity size was normal. There is mild concentric left ventricular hypertrophy. Diastolic function could not be assessed due to  atrial fibrillation.  2. Low normal RV systolic function.  3. Mild left atrial dilatation. Moderate right atrial dilatation.  4. Mild to moderate mitral and tricuspid regurgitation. RVSP 29 mmHg.  5. Vavlular regurgitation and chamber dilatation new since previous echocardiogram on 07/17/2018.   Recent labs: 01/19/2023: Glucose 97, BUN/Cr 11/0.72. EGFR >60. Na/K 137/3.7. Rest of the CMP normal Trop HS 3, 4 H/H 13/39. MCV 94. Platelets 197  05/09/2022: Glucose  83, BUN/Cr 11/0.6. EGFR >60. Na/K 140/3.7. Rest of the CMP normal H/H 14/41. MCV 95. Platelets 215  11/07/2021: Glucose 80, BUN/Cr 10/0.53. EGFR 93. Na/K 140/4.2. Rest of the CMP normal H/H 13/39. Chol 152, TG 83, HDL 50, LDL 86 TSH 1.4   Review of Systems  Cardiovascular:  Negative for chest pain, dyspnea on exertion, leg swelling, palpitations and syncope.  Musculoskeletal:  Positive for joint pain.          Vitals:   01/24/23 1038  BP: 134/78  Pulse: (!) 58  Resp: 16  SpO2: 95%       Body mass index is 29.84 kg/m. Filed Weights   01/24/23 1038  Weight: 208 lb (94.3 kg)     Objective:  Physical Exam Vitals and nursing note reviewed.  Constitutional:      General: She is not in acute distress. Neck:     Vascular: No JVD.  Cardiovascular:     Rate and Rhythm: Normal rate and regular rhythm.     Heart sounds: Normal heart sounds. No murmur heard. Pulmonary:     Effort: Pulmonary effort is normal.     Breath sounds: Normal breath sounds. No wheezing or rales.         Assessment & Recommendations:   82 y.o. African American female  with hypertension, fibromyalgia, paroxysmal Afib, PSVT, PVC  Hypertension: Controlled.  Paroxysmal Afib:  CHA2DS2VASc score 4. Annual stroke risk 5%. Continue Eliquis 5 mg twice daily.  Episode of palpitations leading to ER visit in 01/2023. Recommend 2-week cardiac telemetry.  Hyperlipidemia: Currently on Crestor 20 mg daily. Check lipid panel.  F/u in 6 months   [employer], PA-C 01/24/2023, 10:34 AM Office: [phone]

## 2023-01-24 NOTE — Telephone Encounter (Signed)
Patient home blood pressure is normotensive-soft; heart rate is within normal range. Patient reports she was given a medication that starts with "d" from the ED (presumably diltiazem).  Systolic Blood Pressure mmHg -- 116.4 ([id number] - [id number]) Diastolic Blood Pressure mmHg -- 79.0 (46.0 - 107.0) Heart Rate bpm -- 63.1 (54.0 - 72.0)  01/23/23 7:01 AM  124 / 77 mmHg 61 bpm  01/21/23 4:23 PM  106 / 64 mmHg 62 bpm  01/20/23 11:19 AM  103 / 70 mmHg 62 bpm  01/18/23 6:36 PM  109 / 86 mmHg 68 bpm  01/16/23 10:51 AM  127 / 82 mmHg 64 bpm  01/15/23 5:40 PM  106 / 72 mmHg 59 bpm  01/15/23 5:39 PM  71 / 46 mmHg 54 bpm  01/14/23 11:58 AM  114 / 78 mmHg 61 bpm  01/13/23 2:58 PM  107 / 74 mmHg 65 bpm  01/12/23 8:58 PM  109 / 72 mmHg 71 bpm

## 2023-01-27 ENCOUNTER — Telehealth: Payer: Self-pay

## 2023-01-27 NOTE — Telephone Encounter (Signed)
Patient informed. 

## 2023-01-27 NOTE — Telephone Encounter (Signed)
Patient is asking if it ok for her to get out of bed today?

## 2023-01-27 NOTE — Telephone Encounter (Signed)
I have not recommended any bed rest.  Thanks MJP

## 2023-01-29 ENCOUNTER — Encounter (HOSPITAL_COMMUNITY): Payer: Self-pay

## 2023-01-29 ENCOUNTER — Emergency Department (HOSPITAL_COMMUNITY): Payer: Medicare HMO

## 2023-01-29 ENCOUNTER — Emergency Department (HOSPITAL_COMMUNITY)
Admission: EM | Admit: 2023-01-29 | Discharge: 2023-01-29 | Disposition: A | Discharge status: Home / Self Care | Payer: Medicare HMO | Attending: Emergency Medicine | Admitting: Emergency Medicine

## 2023-01-29 ENCOUNTER — Other Ambulatory Visit: Payer: Self-pay

## 2023-01-29 DIAGNOSIS — Z79899 Other long term (current) drug therapy: Secondary | ICD-10-CM | POA: Diagnosis not present

## 2023-01-29 DIAGNOSIS — I2510 Atherosclerotic heart disease of native coronary artery without angina pectoris: Secondary | ICD-10-CM | POA: Insufficient documentation

## 2023-01-29 DIAGNOSIS — Z8542 Personal history of malignant neoplasm of other parts of uterus: Secondary | ICD-10-CM | POA: Insufficient documentation

## 2023-01-29 DIAGNOSIS — Z7901 Long term (current) use of anticoagulants: Secondary | ICD-10-CM | POA: Diagnosis not present

## 2023-01-29 DIAGNOSIS — R079 Chest pain, unspecified: Secondary | ICD-10-CM | POA: Diagnosis not present

## 2023-01-29 DIAGNOSIS — I10 Essential (primary) hypertension: Secondary | ICD-10-CM | POA: Diagnosis not present

## 2023-01-29 DIAGNOSIS — R0789 Other chest pain: Secondary | ICD-10-CM | POA: Diagnosis not present

## 2023-01-29 DIAGNOSIS — R531 Weakness: Secondary | ICD-10-CM | POA: Diagnosis not present

## 2023-01-29 LAB — BASIC METABOLIC PANEL
Anion gap: 5 (ref 5–15)
BUN: 13 mg/dL (ref 8–23)
CO2: 26 mmol/L (ref 22–32)
Calcium: 9 mg/dL (ref 8.9–10.3)
Chloride: 104 mmol/L (ref 98–111)
Creatinine, Ser: 0.8 mg/dL (ref 0.44–1.00)
GFR, Estimated: >60 mL/min (ref >60)
Glucose, Bld: 94 mg/dL (ref 70–99)
Potassium: 4.3 mmol/L (ref 3.5–5.1)
Sodium: 135 mmol/L (ref 135–145)

## 2023-01-29 LAB — CBC
HCT: 40.8 % (ref 36.0–46.0)
Hemoglobin: 13.1 g/dL (ref 12.0–15.0)
MCH: 31 pg (ref 26.0–34.0)
MCHC: 32.1 g/dL (ref 30.0–36.0)
MCV: 96.5 fL (ref 80.0–100.0)
Platelets: 195 10*3/uL (ref 150–400)
RBC: 4.23 MIL/uL (ref 3.87–5.11)
RDW: 12.6 % (ref 11.5–15.5)
WBC: 4 10*3/uL (ref 4.0–10.5)
nRBC: 0 % (ref 0.0–0.2)

## 2023-01-29 LAB — TROPONIN I (HIGH SENSITIVITY)
Troponin I (High Sensitivity): 2 ng/L (ref <18)
Troponin I (High Sensitivity): 3 ng/L (ref <18)

## 2023-01-29 MED ORDER — ALUM & MAG HYDROXIDE-SIMETH 200-200-20 MG/5ML PO SUSP
30.0000 mL | Freq: Once | ORAL | Status: AC
  Administered 2023-01-29: 30 mL via ORAL
  Filled 2023-01-29: qty 30

## 2023-01-29 MED ORDER — LIDOCAINE VISCOUS HCL 2 % MT SOLN
15.0000 mL | Freq: Once | OROMUCOSAL | Status: AC
  Administered 2023-01-29: 15 mL via ORAL
  Filled 2023-01-29: qty 15

## 2023-01-29 NOTE — ED Provider Notes (Signed)
[facility] Provider Note  CSN: [id number] Arrival date & time: 01/29/23 [id number]  Chief Complaint(s) Chest Pain  HPI Phyllis Knight is a 82 y.o. female with a past medical history listed below who presents to the emergency department for left-sided chest discomfort lasting approximately 45 minutes.  Pain was felt around 9 PM last night.  She reports that the pain was not the concerning aspect.  She was more concerned because she felt like her heart was not beating.  Patient stated that she was still conscious throughout that time.  Stated that she was watching a movie when the episode occurred.  She denied any associated shortness of breath.  No nausea or vomiting.  No recent fevers or infections.  No coughing or congestion.  No abdominal pain.  Additionally patient's husband reports that the patient has been having worsening memory issues and delusions.  Reports that she was seen previously for the same.  On review of records, it appears that patient was seen in June 2023 for hallucinations.  She was instructed to follow-up with neurology for evaluation of dementia/Alzheimer's which she has not done so yet.   Chest Pain   Past Medical History 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   GERD (gastroesophageal reflux disease)    H/O hiatal hernia    Hearing loss    left ear    History of uterine cancer 1975   s/p hysterectomy   Hyperlipidemia    Hypertension    Numbness and tingling    hands and feet bilat    Pneumonia yrs ago   Shortness of breath dyspnea    Syncope 07/16/2018   Tinnitus    Urge urinary incontinence    Urinary incontinence    Vitamin D deficiency     Patient Active Problem List   Diagnosis Date Noted   Cognitive impairment 05/10/2022   Confusion 05/10/2022   PVC (premature ventricular contraction) 09/12/2021   PSVT (paroxysmal supraventricular tachycardia) 09/12/2021   Precordial pain 03/21/2021   Monitoring for long-term anticoagulant use 06/30/2020   Dizziness 04/13/2020   Mixed hyperlipidemia 04/13/2020   Leg edema 12/15/2019   Atypical chest pain 04/28/2019   Palpitations 04/28/2019   Influenza 02/14/2019   Paroxysmal A-fib ([place]) 02/14/2019   Neutropenia ([place]) 02/14/2019   Essential hypertension 07/17/2018   Fibromyalgia 07/17/2018   Pain in right knee 07/17/2018   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 Authorizing Provider  amLODipine (NORVASC) 10 MG tablet Take 1 tablet (10 mg total) by mouth daily. 08/21/22   Patwardhan, Reynold Bowen, MD  cetirizine (ZYRTEC) 10 MG tablet Take 10 mg by mouth daily as needed for rhinitis.    [provider]  cholecalciferol (VITAMIN D) 1000 units tablet Take 2,000 Units by mouth daily.     [provider]  diltiazem (CARDIZEM) 30 MG tablet TAKE 1 TABLET BY MOUTH 3  TIMES DAILY AS NEEDED Patient taking differently: Take 30 mg by mouth 3 (three) times daily as needed (increased heart rate). 11/13/21   Patwardhan, Manish J, MD  ELIQUIS 5 MG TABS tablet TAKE 1 TABLET BY MOUTH TWICE  DAILY 11/12/22   Patwardhan, Manish J, MD  furosemide (LASIX) 20 MG tablet Take 1 tablet (20 mg total) by mouth daily. Patient taking differently: Take 10 mg by mouth daily. 06/20/20   Patwardhan, Reynold Bowen, MD  metoprolol succinate (TOPROL-XL) 50 MG 24 hr tablet TAKE 1 TABLET BY MOUTH  DAILY WITH OR IMMEDIATELY  FOLLOWING A MEAL Patient taking differently: Take 50 mg by mouth daily. 04/18/20   Patwardhan, Reynold Bowen, MD   pantoprazole (PROTONIX) 40 MG tablet Take 40 mg by mouth daily as needed (for acid reflux). 01/26/19   [provider]  potassium chloride (KLOR-CON) 8 MEQ tablet Take 8 mEq by mouth daily.  04/17/15   [provider]  rosuvastatin (CRESTOR) 20 MG tablet TAKE 1 TABLET BY MOUTH DAILY 08/19/22   Patwardhan, Reynold Bowen, MD  traMADol-acetaminophen (ULTRACET) 37.5-325 MG tablet Take 1 tablet by mouth every 6 (six) hours as needed (soft tissue and muscle pain). 08/04/20   [provider]                                                                                                                                    Allergies Duloxetine hcl, Cefuroxime, Codeine, and Zofran [ondansetron hcl]  Review of Systems Review of Systems  Cardiovascular:  Positive for chest pain.   As noted in HPI  Physical Exam Vital Signs  I have reviewed the triage vital signs BP (!) 156/97 (BP Location: Left Arm)   Pulse 61   Temp 98 F (36.7 C) (Oral)   Resp 16   Ht '5\' 10"'$  (1.778 m)   Wt 94 kg   SpO2 100%   BMI 29.73 kg/m   Physical Exam Vitals reviewed.  Constitutional:      General: She is not in acute distress.    Appearance: She is well-developed. She is not diaphoretic.  HENT:     Head: Normocephalic and atraumatic.     Nose: Nose normal.  Eyes:     General: No scleral icterus.       Right eye: No discharge.        Left eye: No discharge.     Conjunctiva/sclera: Conjunctivae normal.     Pupils: Pupils are equal, round, and reactive to light.  Cardiovascular:     Rate and Rhythm: Normal rate and regular rhythm.     Heart sounds: No murmur heard.    No friction rub. No gallop.  Pulmonary:     Effort: Pulmonary effort is normal. No respiratory distress.     Breath sounds: Normal breath sounds. No stridor. No rales.  Chest:    Abdominal:     General: There is no distension.  Palpations: Abdomen is soft.     Tenderness: There is no abdominal tenderness.   Musculoskeletal:        General: No tenderness.     Cervical back: Normal range of motion and neck supple.  Skin:    General: Skin is warm and dry.     Findings: No erythema or rash.  Neurological:     Mental Status: She is alert and oriented to person, place, and time.     ED Results and Treatments Labs (all labs ordered are listed, but only abnormal results are displayed) Labs Reviewed  BASIC METABOLIC PANEL  CBC  TROPONIN I (HIGH SENSITIVITY)  TROPONIN I (HIGH SENSITIVITY)                                                                                                                         EKG  EKG Interpretation  Date/Time:  Wednesday January 29 2023 00:41:10 EST Ventricular Rate:  60 PR Interval:  199 QRS Duration: 98 QT Interval:  417 QTC Calculation: 417 R Axis:   12 Text Interpretation: Sinus rhythm Abnormal R-wave progression, early transition No significant change was found Confirmed by Addison Lank [phone]) on 01/29/2023 1:02:45 AM       Radiology DG Chest 2 View  Result Date: 01/29/2023 CLINICAL DATA:  Chest pain and weakness EXAM: CHEST - 2 VIEW COMPARISON:  01/29/2023 FINDINGS: Stable cardiomediastinal silhouette. Aortic atherosclerotic calcification. Chronic bronchitic changes. No focal consolidation, pleural effusion, or pneumothorax. No acute osseous abnormality. Electronic device over the left mid lung. IMPRESSION: No active cardiopulmonary disease. Electronically Signed   By: Placido Sou M.D.   On: 01/29/2023 01:00    Medications Ordered in ED Medications  alum & mag hydroxide-simeth (MAALOX/MYLANTA) 200-200-20 MG/5ML suspension 30 mL (30 mLs Oral Given 01/29/23 0129)    And  lidocaine (XYLOCAINE) 2 % viscous mouth solution 15 mL (15 mLs Oral Given 01/29/23 0129)                                                                                                                                     Procedures Procedures  (including critical care  time)  Medical Decision Making / ED Course  Click here for ABCD2, HEART and other calculators  Medical Decision Making Amount and/or Complexity of Data Reviewed Labs: ordered. Decision-making details documented in ED Course. Radiology: ordered and independent interpretation performed. Decision-making details documented  in ED Course. ECG/medicine tests: ordered and independent interpretation performed. Decision-making details documented in ED Course.  Risk OTC drugs. Prescription drug management.   This patient presents to the ED for concern of chest pain, this involves an extensive number of treatment options, and is a complaint that carries with it a high risk of complications and morbidity. The differential diagnosis includes but not limited to ACS, PE, PTx, PNA, GI etiology.  EKG without acute ischemic changes or evidence of pericarditis.  No acute dysrhythmias or blocks. Serial troponins negative x 2 now more than 6 hours after pain resolved.  Feel this is sufficient to rule out ACS given her clinical picture.  Patient is currently anticoagulated on Eliquis for A-fib.  No missed doses.  Given pain resolution, I have low suspicion for pulmonary embolism at this time.  Chest x-ray without evidence of pneumonia, pneumothorax, pulmonary edema or pleural effusions.  CBC without leukocytosis or anemia.   BMP without significant electrolyte derangements or renal sufficiency.  During her stay, patient reported return of mild discomfort that was completely resolved after GI cocktail.      Final Clinical Impression(s) / ED Diagnoses Final diagnoses:  Chest discomfort   The patient appears reasonably screened and/or stabilized for discharge and I doubt any other medical condition or other [facility] requiring further screening, evaluation, or treatment in the ED at this time. I have discussed the findings, Dx and Tx plan with the patient/family who expressed understanding and agree(s) with the  plan. Discharge instructions discussed at length. The patient/family was given strict return precautions who verbalized understanding of the instructions. No further questions at time of discharge.  Disposition: Discharge  Condition: Good  ED Discharge Orders     None        Follow Up: Primary care provider  Call  to schedule an appointment for close follow up  [place] [address]     [place] [phone] Call  to schedule an appointment for close follow up           This chart was dictated using voice recognition software.  Despite best efforts to proofread,  errors can occur which can change the documentation meaning.    Fatima Blank, MD 01/29/23 0600

## 2023-01-29 NOTE — ED Triage Notes (Addendum)
Pt states that her chest hurts and that her heart was barely beating tonight. Pt states that her doctor is waiting on her upstairs.

## 2023-01-29 NOTE — ED Notes (Signed)
Pt was assisted to restroom via wheelchair. Pt was provided new brief, pair of paper ED pants, pt returned to bed and positioned to comfort.

## 2023-01-29 NOTE — ED Notes (Signed)
Pt went to restroom following XR with [employer], was not able to collect urine, will obtain if needed.

## 2023-01-31 DIAGNOSIS — E785 Hyperlipidemia, unspecified: Secondary | ICD-10-CM | POA: Diagnosis not present

## 2023-01-31 DIAGNOSIS — E876 Hypokalemia: Secondary | ICD-10-CM | POA: Diagnosis not present

## 2023-01-31 DIAGNOSIS — I4891 Unspecified atrial fibrillation: Secondary | ICD-10-CM | POA: Diagnosis not present

## 2023-01-31 DIAGNOSIS — M1990 Unspecified osteoarthritis, unspecified site: Secondary | ICD-10-CM | POA: Diagnosis not present

## 2023-01-31 DIAGNOSIS — I739 Peripheral vascular disease, unspecified: Secondary | ICD-10-CM | POA: Diagnosis not present

## 2023-01-31 DIAGNOSIS — Z008 Encounter for other general examination: Secondary | ICD-10-CM | POA: Diagnosis not present

## 2023-01-31 DIAGNOSIS — K219 Gastro-esophageal reflux disease without esophagitis: Secondary | ICD-10-CM | POA: Diagnosis not present

## 2023-01-31 DIAGNOSIS — D6869 Other thrombophilia: Secondary | ICD-10-CM | POA: Diagnosis not present

## 2023-01-31 DIAGNOSIS — H269 Unspecified cataract: Secondary | ICD-10-CM | POA: Diagnosis not present

## 2023-01-31 DIAGNOSIS — Z823 Family history of stroke: Secondary | ICD-10-CM | POA: Diagnosis not present

## 2023-01-31 DIAGNOSIS — I10 Essential (primary) hypertension: Secondary | ICD-10-CM | POA: Diagnosis not present

## 2023-01-31 DIAGNOSIS — E669 Obesity, unspecified: Secondary | ICD-10-CM | POA: Diagnosis not present

## 2023-01-31 DIAGNOSIS — Z8249 Family history of ischemic heart disease and other diseases of the circulatory system: Secondary | ICD-10-CM | POA: Diagnosis not present

## 2023-02-06 DIAGNOSIS — I10 Essential (primary) hypertension: Secondary | ICD-10-CM | POA: Diagnosis not present

## 2023-02-12 ENCOUNTER — Emergency Department (HOSPITAL_COMMUNITY): Payer: Medicare HMO

## 2023-02-12 ENCOUNTER — Inpatient Hospital Stay (HOSPITAL_COMMUNITY)
Admission: EM | Admit: 2023-02-12 | Discharge: 2023-02-14 | DRG: 641 | Disposition: A | Discharge status: Home Health | Payer: Medicare HMO | Attending: Internal Medicine | Admitting: Internal Medicine

## 2023-02-12 ENCOUNTER — Encounter (HOSPITAL_COMMUNITY): Payer: Self-pay | Admitting: Internal Medicine

## 2023-02-12 ENCOUNTER — Other Ambulatory Visit: Payer: Self-pay

## 2023-02-12 DIAGNOSIS — M797 Fibromyalgia: Secondary | ICD-10-CM | POA: Diagnosis present

## 2023-02-12 DIAGNOSIS — E876 Hypokalemia: Secondary | ICD-10-CM | POA: Diagnosis not present

## 2023-02-12 DIAGNOSIS — I5032 Chronic diastolic (congestive) heart failure: Secondary | ICD-10-CM | POA: Diagnosis not present

## 2023-02-12 DIAGNOSIS — K2970 Gastritis, unspecified, without bleeding: Secondary | ICD-10-CM | POA: Diagnosis present

## 2023-02-12 DIAGNOSIS — F0390 Unspecified dementia without behavioral disturbance: Secondary | ICD-10-CM | POA: Diagnosis not present

## 2023-02-12 DIAGNOSIS — E782 Mixed hyperlipidemia: Secondary | ICD-10-CM | POA: Diagnosis not present

## 2023-02-12 DIAGNOSIS — E861 Hypovolemia: Secondary | ICD-10-CM | POA: Diagnosis not present

## 2023-02-12 DIAGNOSIS — Z981 Arthrodesis status: Secondary | ICD-10-CM | POA: Diagnosis not present

## 2023-02-12 DIAGNOSIS — Z888 Allergy status to other drugs, medicaments and biological substances status: Secondary | ICD-10-CM

## 2023-02-12 DIAGNOSIS — F22 Delusional disorders: Secondary | ICD-10-CM | POA: Diagnosis present

## 2023-02-12 DIAGNOSIS — E559 Vitamin D deficiency, unspecified: Secondary | ICD-10-CM | POA: Diagnosis not present

## 2023-02-12 DIAGNOSIS — Z6832 Body mass index (BMI) 32.0-32.9, adult: Secondary | ICD-10-CM

## 2023-02-12 DIAGNOSIS — E871 Hypo-osmolality and hyponatremia: Principal | ICD-10-CM

## 2023-02-12 DIAGNOSIS — R69 Illness, unspecified: Secondary | ICD-10-CM | POA: Diagnosis not present

## 2023-02-12 DIAGNOSIS — Z90722 Acquired absence of ovaries, bilateral: Secondary | ICD-10-CM | POA: Diagnosis not present

## 2023-02-12 DIAGNOSIS — Z79899 Other long term (current) drug therapy: Secondary | ICD-10-CM

## 2023-02-12 DIAGNOSIS — F03A0 Unspecified dementia, mild, without behavioral disturbance, psychotic disturbance, mood disturbance, and anxiety: Secondary | ICD-10-CM | POA: Diagnosis present

## 2023-02-12 DIAGNOSIS — K219 Gastro-esophageal reflux disease without esophagitis: Secondary | ICD-10-CM | POA: Diagnosis present

## 2023-02-12 DIAGNOSIS — I10 Essential (primary) hypertension: Secondary | ICD-10-CM

## 2023-02-12 DIAGNOSIS — Z885 Allergy status to narcotic agent status: Secondary | ICD-10-CM

## 2023-02-12 DIAGNOSIS — I480 Paroxysmal atrial fibrillation: Secondary | ICD-10-CM

## 2023-02-12 DIAGNOSIS — E669 Obesity, unspecified: Secondary | ICD-10-CM | POA: Diagnosis not present

## 2023-02-12 DIAGNOSIS — K2900 Acute gastritis without bleeding: Secondary | ICD-10-CM | POA: Diagnosis not present

## 2023-02-12 DIAGNOSIS — R112 Nausea with vomiting, unspecified: Secondary | ICD-10-CM | POA: Diagnosis not present

## 2023-02-12 DIAGNOSIS — Z1152 Encounter for screening for COVID-19: Secondary | ICD-10-CM

## 2023-02-12 DIAGNOSIS — I700 Atherosclerosis of aorta: Secondary | ICD-10-CM | POA: Diagnosis not present

## 2023-02-12 DIAGNOSIS — Z8542 Personal history of malignant neoplasm of other parts of uterus: Secondary | ICD-10-CM | POA: Diagnosis not present

## 2023-02-12 DIAGNOSIS — Z90710 Acquired absence of both cervix and uterus: Secondary | ICD-10-CM

## 2023-02-12 DIAGNOSIS — I110 Hypertensive heart disease with heart failure: Secondary | ICD-10-CM | POA: Diagnosis not present

## 2023-02-12 DIAGNOSIS — R109 Unspecified abdominal pain: Secondary | ICD-10-CM | POA: Diagnosis not present

## 2023-02-12 DIAGNOSIS — Z9049 Acquired absence of other specified parts of digestive tract: Secondary | ICD-10-CM | POA: Diagnosis not present

## 2023-02-12 DIAGNOSIS — I2510 Atherosclerotic heart disease of native coronary artery without angina pectoris: Secondary | ICD-10-CM | POA: Diagnosis not present

## 2023-02-12 DIAGNOSIS — R1110 Vomiting, unspecified: Secondary | ICD-10-CM | POA: Diagnosis not present

## 2023-02-12 DIAGNOSIS — Z96652 Presence of left artificial knee joint: Secondary | ICD-10-CM | POA: Diagnosis not present

## 2023-02-12 DIAGNOSIS — Z7901 Long term (current) use of anticoagulants: Secondary | ICD-10-CM

## 2023-02-12 LAB — CBC
HCT: 39.3 % (ref 36.0–46.0)
Hemoglobin: 13.7 g/dL (ref 12.0–15.0)
MCH: 31.7 pg (ref 26.0–34.0)
MCHC: 34.9 g/dL (ref 30.0–36.0)
MCV: 91 fL (ref 80.0–100.0)
Platelets: 188 10*3/uL (ref 150–400)
RBC: 4.32 MIL/uL (ref 3.87–5.11)
RDW: 11.9 % (ref 11.5–15.5)
WBC: 5.6 10*3/uL (ref 4.0–10.5)
nRBC: 0 % (ref 0.0–0.2)

## 2023-02-12 LAB — RESP PANEL BY RT-PCR (RSV, FLU A&B, COVID)  RVPGX2
Influenza A by PCR: NEGATIVE
Influenza B by PCR: NEGATIVE
Resp Syncytial Virus by PCR: NEGATIVE
SARS Coronavirus 2 by RT PCR: NEGATIVE

## 2023-02-12 LAB — COMPREHENSIVE METABOLIC PANEL
ALT: 13 U/L (ref 0–44)
AST: 26 U/L (ref 15–41)
Albumin: 4 g/dL (ref 3.5–5.0)
Alkaline Phosphatase: 59 U/L (ref 38–126)
Anion gap: 9 (ref 5–15)
BUN: 8 mg/dL (ref 8–23)
CO2: 23 mmol/L (ref 22–32)
Calcium: 8.3 mg/dL — ABNORMAL LOW (ref 8.9–10.3)
Chloride: 91 mmol/L — ABNORMAL LOW (ref 98–111)
Creatinine, Ser: 0.52 mg/dL (ref 0.44–1.00)
GFR, Estimated: >60 mL/min (ref >60)
Glucose, Bld: 98 mg/dL (ref 70–99)
Potassium: 3.5 mmol/L (ref 3.5–5.1)
Sodium: 123 mmol/L — ABNORMAL LOW (ref 135–145)
Total Bilirubin: 1.6 mg/dL — ABNORMAL HIGH (ref 0.3–1.2)
Total Protein: 7.6 g/dL (ref 6.5–8.1)

## 2023-02-12 LAB — BASIC METABOLIC PANEL
Anion gap: 11 (ref 5–15)
BUN: 7 mg/dL — ABNORMAL LOW (ref 8–23)
CO2: 23 mmol/L (ref 22–32)
Calcium: 8.6 mg/dL — ABNORMAL LOW (ref 8.9–10.3)
Chloride: 96 mmol/L — ABNORMAL LOW (ref 98–111)
Creatinine, Ser: 0.49 mg/dL (ref 0.44–1.00)
GFR, Estimated: >60 mL/min (ref >60)
Glucose, Bld: 83 mg/dL (ref 70–99)
Potassium: 3.4 mmol/L — ABNORMAL LOW (ref 3.5–5.1)
Sodium: 130 mmol/L — ABNORMAL LOW (ref 135–145)

## 2023-02-12 LAB — URINALYSIS, ROUTINE W REFLEX MICROSCOPIC
Bacteria, UA: NONE SEEN
Bilirubin Urine: NEGATIVE
Glucose, UA: NEGATIVE mg/dL
Ketones, ur: NEGATIVE mg/dL
Leukocytes,Ua: NEGATIVE
Nitrite: NEGATIVE
Protein, ur: NEGATIVE mg/dL
Specific Gravity, Urine: 1 — ABNORMAL LOW (ref 1.005–1.030)
pH: 6 (ref 5.0–8.0)

## 2023-02-12 LAB — LACTIC ACID, PLASMA
Lactic Acid, Venous: 1.1 mmol/L (ref 0.5–1.9)
Lactic Acid, Venous: 1 mmol/L (ref 0.5–1.9)

## 2023-02-12 LAB — PHOSPHORUS: Phosphorus: 3.5 mg/dL (ref 2.5–4.6)

## 2023-02-12 LAB — MAGNESIUM: Magnesium: 1.8 mg/dL (ref 1.7–2.4)

## 2023-02-12 LAB — SODIUM, URINE, RANDOM: Sodium, Ur: <10 mmol/L

## 2023-02-12 LAB — CK: Total CK: 539 U/L — ABNORMAL HIGH (ref 38–234)

## 2023-02-12 LAB — CREATININE, URINE, RANDOM: Creatinine, Urine: <10 mg/dL

## 2023-02-12 LAB — TSH: TSH: 1.066 u[IU]/mL (ref 0.350–4.500)

## 2023-02-12 LAB — LIPASE, BLOOD: Lipase: 43 U/L (ref 11–51)

## 2023-02-12 LAB — TROPONIN I (HIGH SENSITIVITY)
Troponin I (High Sensitivity): 10 ng/L (ref <18)
Troponin I (High Sensitivity): 9 ng/L (ref <18)

## 2023-02-12 MED ORDER — IOHEXOL 300 MG/ML  SOLN
100.0000 mL | Freq: Once | INTRAMUSCULAR | Status: AC | PRN
  Administered 2023-02-12: 100 mL via INTRAVENOUS

## 2023-02-12 MED ORDER — METOPROLOL SUCCINATE ER 50 MG PO TB24
50.0000 mg | ORAL_TABLET | Freq: Every day | ORAL | Status: DC
  Administered 2023-02-13: 50 mg via ORAL
  Filled 2023-02-12 (×2): qty 1

## 2023-02-12 MED ORDER — APIXABAN 5 MG PO TABS
5.0000 mg | ORAL_TABLET | Freq: Two times a day (BID) | ORAL | Status: DC
  Administered 2023-02-12 – 2023-02-14 (×4): 5 mg via ORAL
  Filled 2023-02-12 (×4): qty 1

## 2023-02-12 MED ORDER — SODIUM CHLORIDE 0.9 % IV BOLUS
1000.0000 mL | Freq: Once | INTRAVENOUS | Status: AC
  Administered 2023-02-12: 1000 mL via INTRAVENOUS

## 2023-02-12 MED ORDER — ACETAMINOPHEN 650 MG RE SUPP: 650.0000 mg | Freq: Four times a day (QID) | RECTAL | Status: DC | PRN

## 2023-02-12 MED ORDER — POTASSIUM CHLORIDE CRYS ER 20 MEQ PO TBCR
40.0000 meq | EXTENDED_RELEASE_TABLET | Freq: Once | ORAL | Status: AC
  Administered 2023-02-12: 40 meq via ORAL
  Filled 2023-02-12: qty 2

## 2023-02-12 MED ORDER — ROSUVASTATIN CALCIUM 20 MG PO TABS
20.0000 mg | ORAL_TABLET | Freq: Every day | ORAL | Status: DC
  Administered 2023-02-13 – 2023-02-14 (×2): 20 mg via ORAL
  Filled 2023-02-12 (×2): qty 1

## 2023-02-12 MED ORDER — METOCLOPRAMIDE HCL 5 MG/ML IJ SOLN
10.0000 mg | Freq: Once | INTRAMUSCULAR | Status: AC
  Administered 2023-02-12: 10 mg via INTRAVENOUS
  Filled 2023-02-12: qty 2

## 2023-02-12 MED ORDER — SODIUM CHLORIDE 0.9 % IV SOLN: INTRAVENOUS | Status: DC

## 2023-02-12 MED ORDER — ACETAMINOPHEN 325 MG PO TABS
650.0000 mg | ORAL_TABLET | Freq: Once | ORAL | Status: AC
  Administered 2023-02-12: 650 mg via ORAL
  Filled 2023-02-12: qty 2

## 2023-02-12 MED ORDER — AMLODIPINE BESYLATE 10 MG PO TABS
10.0000 mg | ORAL_TABLET | Freq: Every day | ORAL | Status: DC
  Administered 2023-02-13: 10 mg via ORAL
  Filled 2023-02-12 (×2): qty 1

## 2023-02-12 MED ORDER — ACETAMINOPHEN 325 MG PO TABS: 650.0000 mg | ORAL_TABLET | Freq: Four times a day (QID) | ORAL | Status: DC | PRN

## 2023-02-12 MED ORDER — TRAMADOL-ACETAMINOPHEN 37.5-325 MG PO TABS: 1.0000 | ORAL_TABLET | Freq: Four times a day (QID) | ORAL | Status: DC | PRN

## 2023-02-12 MED ORDER — HYDROCODONE-ACETAMINOPHEN 5-325 MG PO TABS
1.0000 | ORAL_TABLET | ORAL | Status: DC | PRN
  Administered 2023-02-12 – 2023-02-13 (×2): 2 via ORAL
  Administered 2023-02-14: 1 via ORAL
  Filled 2023-02-12 (×3): qty 2

## 2023-02-12 MED ORDER — PANTOPRAZOLE SODIUM 40 MG PO TBEC: 40.0000 mg | DELAYED_RELEASE_TABLET | Freq: Every day | ORAL | Status: DC | PRN

## 2023-02-12 NOTE — H&P (Signed)
[name] [id number] DOB: 21-Oct-1941 DOA: 02/12/2023     PCP: Merryl Hacker, No   Outpatient Specialists:   CARDS  [name],     Patient arrived to ER on 02/12/23 at 1315 Referred by Attending Toy Baker, MD   Patient coming from:    home Lives With family    Chief Complaint:   Chief Complaint  Patient presents with   Emesis    HPI: Phyllis Knight is a 82 y.o. female with medical history significant of dementia hypertension atrial fibrillation    Presented with nausea vomiting abdominal pain Patient was brought in complaining of nausea vomiting decreased p.o. intake last bowel movement 2 days ago patient has mild dementia and atrial fibrillation Has been having abdominal pain for the past 2 days couple episodes of nausea and vomiting no chest pain Reports cramping intermittent abdominal pain she is status postcholecystectomy and hysterectomy no fevers or chills Patient usually able to ambulate denies any fevers or chills no sick contacts.  No diarrhea.  Patient has been drinking plenty of water to try to make herself go to the bathroom. Denies any chest pain or confusion.  Husband at bedside. Patient has had decreased solid food intake but increasing water intake  Initial COVID TEST  NEGATIVE  Lab Results  Component Value Date   [place] 02/12/2023   [place] NEGATIVE 05/09/2022   [place] Not Detected 06/02/2019     Regarding pertinent Chronic problems:     Hyperlipidemia - on statins Crestor Lipid Panel     Component Value Date/Time   CHOL 207 (H) 12/15/2019 1142   TRIG 119 12/15/2019 1142   HDL 47 12/15/2019 1142   CHOLHDL 4.4 12/15/2019 1142   LDLCALC 139 (H) 12/15/2019 1142   LABVLDL 21 12/15/2019 1142     HTN on Norvasc Cardizem Toprol   chronic CHF diastolic  - last echo [id number]   Preserved EF 60-65% Lasix    CAD on CT   Lexiscan Myoview stress test 06/07/2019: Lexiscan stress test was performed. Stress  EKG is non-diagnostic, as this is pharmacological stress test. In addition, Rest and stress EKG reveal sinus rhythm, frequent PVC's and occasional PAC's.  SPECT images revealed small sized, mildly reversible, mild intensity perfusion defect in basal inferior myocardium. While breast attenuation is possible (imaging performed in sitting position), small area of ischemia cannot be excluded. LVEF 73% with normal wall motion. Low risk study.      A. Fib -  - CHA2DS2 vas score     4 current  on anticoagulation with  Eliquis,       -  Rate control:  Currently controlled with  Toprolol,  Diltiazem,      Dementia - mild     While in ER:   Found to have sodium down to 123 which is new for the patient CT abdomen showed no evidence of intra-abdominal abnormalities or constipation But still somewhat tender patient started IV fluids Reglan and Tylenol admitted for hyponatremia    CXR -  NON acute  CTabd/pelvis -  non acute   Following Medications were ordered in ER: Medications  metoCLOPramide (REGLAN) injection 10 mg (has no administration in time range)  acetaminophen (TYLENOL) tablet 650 mg (has no administration in time range)  sodium chloride 0.9 % bolus 1,000 mL (0 mLs Intravenous Stopped 02/12/23 1843)  iohexol (OMNIPAQUE) 300 MG/ML solution 100 mL (100 mLs Intravenous Contrast Given 02/12/23 1634)       ED Triage  Vitals  Enc Vitals Group     BP 02/12/23 1330 (!) 139/104     Pulse Rate 02/12/23 1330 67     Resp 02/12/23 1330 18     Temp 02/12/23 1330 97.6 F (36.4 C)     Temp Source 02/12/23 1330 Oral     SpO2 02/12/23 1330 100 %     Weight --      Height --      Head Circumference --      Peak Flow --      Pain Score 02/12/23 1338 0     Pain Loc --      Pain Edu? --      Excl. in [place]? --   TMAX(24)@     _________________________________________ Significant initial  Findings: Abnormal Labs Reviewed  COMPREHENSIVE METABOLIC PANEL - Abnormal; Notable for the following  components:      Result Value   Sodium 123 (*)    Chloride 91 (*)    Calcium 8.3 (*)    Total Bilirubin 1.6 (*)    All other components within normal limits  URINALYSIS, ROUTINE W REFLEX MICROSCOPIC - Abnormal; Notable for the following components:   Color, Urine COLORLESS (*)    Specific Gravity, Urine 1.000 (*)    Hgb urine dipstick SMALL (*)    All other components within normal limits     _________________________ Troponin 10 ECG: Ordered Personally reviewed and interpreted by me showing: HR : 68 Rhythm: Atrial fibrillation Abnormal R-wave progression, early transition QTC 454     The recent clinical data is shown below. Vitals:   02/12/23 1515 02/12/23 1518 02/12/23 1600 02/12/23 1700  BP: (!) 160/85  133/88 (!) 143/69  Pulse: (!) 59  62 60  Resp: '19  18 18  '$ Temp:  98 F (36.7 C)    TempSrc:  Oral    SpO2: 92%  99% 99%       WBC     Component Value Date/Time   WBC 5.6 02/12/2023 1342   LYMPHSABS 1.9 05/09/2022 2048   MONOABS 0.4 05/09/2022 2048   EOSABS 0.2 05/09/2022 2048   BASOSABS 0.0 05/09/2022 2048        Lactic Acid, Venous    Component Value Date/Time   LATICACIDVEN 1.1 02/12/2023 1852       UA   no evidence of UTI   Urine analysis:    Component Value Date/Time   COLORURINE COLORLESS (A) 02/12/2023 1342   APPEARANCEUR CLEAR 02/12/2023 1342   LABSPEC 1.000 (L) 02/12/2023 1342   PHURINE 6.0 02/12/2023 1342   GLUCOSEU NEGATIVE 02/12/2023 1342   HGBUR SMALL (A) 02/12/2023 1342   BILIRUBINUR NEGATIVE 02/12/2023 1342   KETONESUR NEGATIVE 02/12/2023 1342   PROTEINUR NEGATIVE 02/12/2023 1342   UROBILINOGEN 1.0 05/19/2014 2348   NITRITE NEGATIVE 02/12/2023 1342   LEUKOCYTESUR NEGATIVE 02/12/2023 1342    Results for orders placed or performed during the hospital encounter of 02/12/23  Resp panel by RT-PCR (RSV, Flu A&B, Covid) Anterior Nasal Swab     Status: None   Collection Time: 02/12/23  6:31 PM   Specimen: Anterior Nasal Swab   Result Value Ref Range Status   SARS Coronavirus 2 by RT PCR NEGATIVE NEGATIVE Final          Influenza A by PCR NEGATIVE NEGATIVE Final   Influenza B by PCR NEGATIVE NEGATIVE Final         Resp Syncytial Virus by PCR NEGATIVE NEGATIVE Final  _______________________________________________ Hospitalist was called for admission for   Hyponatremia    Nausea and vomiting, unspecified vomiting type     The following Work up has been ordered so far:  Orders Placed This Encounter  Procedures   Resp panel by RT-PCR (RSV, Flu A&B, Covid) Anterior Nasal Swab   CT ABDOMEN PELVIS W CONTRAST   DG Chest 1 View   Lipase, blood   Comprehensive metabolic panel   CBC   Urinalysis, Routine w reflex microscopic -Urine, Clean Catch   TSH   Diet NPO time specified   Cardiac Monitoring - Continuous Indefinite   Consult to hospitalist   Airborne and Contact precautions   ED EKG   EKG 12-Lead   Admit to Inpatient (patient's expected length of stay will be greater than 2 midnights or inpatient only procedure)     OTHER Significant initial  Findings:  labs showing:    Recent Labs  Lab 02/12/23 1342 02/12/23 1852 02/12/23 1923  NA 123* 130*  --   K 3.5 3.4*  --   CO2 23 23  --   GLUCOSE 98 83  --   BUN 8 7*  --   CREATININE 0.52 0.49  --   CALCIUM 8.3* 8.6*  --   MG  --   --  1.8  PHOS  --   --  3.5    Cr   stable  Lab Results  Component Value Date   CREATININE 0.52 02/12/2023   CREATININE 0.80 01/29/2023   CREATININE 0.72 01/19/2023    Recent Labs  Lab 02/12/23 1342  AST 26  ALT 13  ALKPHOS 59  BILITOT 1.6*  PROT 7.6  ALBUMIN 4.0   Lab Results  Component Value Date   CALCIUM 8.3 (L) 02/12/2023        Plt: Lab Results  Component Value Date   PLT 188 02/12/2023       COVID-19 Labs  No results for input(s): "DDIMER", "FERRITIN", "LDH", "CRP" in the last 72 hours.  Lab Results  Component Value Date   SARSCOV2NAA NEGATIVE 05/09/2022    [place] Not Detected 06/02/2019        Recent Labs  Lab 02/12/23 1342  WBC 5.6  HGB 13.7  HCT 39.3  MCV 91.0  PLT 188    HG/HCT   stable,     Component Value Date/Time   HGB 13.7 02/12/2023 1342   HCT 39.3 02/12/2023 1342   HCT 40.9 02/15/2019 0044   MCV 91.0 02/12/2023 1342     Recent Labs  Lab 02/12/23 1342  LIPASE 43      Cardiac Panel (last 3 results) Recent Labs    02/12/23 1923  CKTOTAL 539*    Cultures:    Component Value Date/Time   SDES URINE, CLEAN CATCH 05/19/2014 2348   SPECREQUEST NONE 05/19/2014 2348   CULT  05/19/2014 2348    Multiple bacterial morphotypes present, none predominant. Suggest appropriate recollection if clinically indicated. Performed at [place] 05/21/2014 FINAL 05/19/2014 2348     Radiological Exams on Admission: DG Chest 1 View  Result Date: 02/12/2023 CLINICAL DATA:  Nausea and vomiting EXAM: CHEST  1 VIEW COMPARISON:  01/29/2023 FINDINGS: Stable cardiomediastinal silhouette. Aortic atherosclerotic calcification. Chronic bronchitic changes. No focal consolidation, pleural effusion, or pneumothorax. No acute osseous abnormality. IMPRESSION: No active disease. Electronically Signed   By: Placido Sou M.D.   On: 02/12/2023 19:01   CT ABDOMEN PELVIS W CONTRAST  Result Date: 02/12/2023  CLINICAL DATA:  Abdominal pain for 2 days, vomiting, constipation EXAM: CT ABDOMEN AND PELVIS WITH CONTRAST TECHNIQUE: Multidetector CT imaging of the abdomen and pelvis was performed using the standard protocol following bolus administration of intravenous contrast. RADIATION DOSE REDUCTION: This exam was performed according to the departmental dose-optimization program which includes automated exposure control, adjustment of the mA and/or kV according to patient size and/or use of iterative reconstruction technique. CONTRAST:  141m OMNIPAQUE IOHEXOL 300 MG/ML  SOLN COMPARISON:  05/20/2014 FINDINGS: Lower chest: No  acute abnormality.  Coronary artery calcifications. Hepatobiliary: No focal liver abnormality is seen. Status post cholecystectomy. No biliary dilatation. Pancreas: Unremarkable. No pancreatic ductal dilatation or surrounding inflammatory changes. Spleen: Normal in size without significant abnormality. Adrenals/Urinary Tract: Adrenal glands are unremarkable. Kidneys are normal, without renal calculi, solid lesion, or hydronephrosis. Bladder is unremarkable. Stomach/Bowel: Stomach is within normal limits. Appendix is not clearly visualized and may be surgically absent no evidence of bowel wall thickening, distention, or inflammatory changes. Sigmoid diverticula. Vascular/Lymphatic: Aortic atherosclerosis. No enlarged abdominal or pelvic lymph nodes. Reproductive: Status post hysterectomy. Other: Ventral hernia mesh repair. Musculoskeletal: No acute or significant osseous findings. IMPRESSION: 1. No acute CT findings of the abdomen or pelvis to explain abdominal pain. Specifically, no evidence of bowel obstruction or constipation. 2. Sigmoid diverticulosis without evidence of acute diverticulitis. 3. Status post cholecystectomy, hysterectomy, and ventral hernia mesh repair. 4. Coronary artery disease. Aortic Atherosclerosis (ICD10-I70.0). Electronically Signed   By: ADelanna AhmadiM.D.   On: 02/12/2023 17:04   _______________________________________________________________________________________________________ Latest  Blood pressure (!) 143/69, pulse 60, temperature 98 F (36.7 C), temperature source Oral, resp. rate 18, SpO2 99 %.   Vitals  labs and radiology finding personally reviewed  Review of Systems:    Pertinent positives include:  fatigue, abdominal pain, nausea, vomiting,   Constitutional:  No weight loss, night sweats, Fevers, chills,  weight loss  HEENT:  No headaches, Difficulty swallowing,Tooth/dental problems,Sore throat,  No sneezing, itching, ear ache, nasal congestion, post nasal drip,   Cardio-vascular:  No chest pain, Orthopnea, PND, anasarca, dizziness, palpitations.no Bilateral lower extremity swelling  GI:  No heartburn, indigestion,diarrhea, change in bowel habits, loss of appetite, melena, blood in stool, hematemesis Resp:  no shortness of breath at rest. No dyspnea on exertion, No excess mucus, no productive cough, No non-productive cough, No coughing up of blood.No change in color of mucus.No wheezing. Skin:  no rash or lesions. No jaundice GU:  no dysuria, change in color of urine, no urgency or frequency. No straining to urinate.  No flank pain.  Musculoskeletal:  No joint pain or no joint swelling. No decreased range of motion. No back pain.  Psych:  No change in mood or affect. No depression or anxiety. No memory loss.  Neuro: no localizing neurological complaints, no tingling, no weakness, no double vision, no gait abnormality, no slurred speech, no confusion  All systems reviewed and apart from HNormandy Parkall are negative _______________________________________________________________________________________________ Past Medical History:   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   GERD (gastroesophageal reflux disease)    H/O hiatal  hernia    Hearing loss    left ear    History of uterine cancer 1975   s/p hysterectomy   Hyperlipidemia    Hypertension    Numbness and tingling    hands and feet bilat    Pneumonia yrs ago   Shortness of breath dyspnea    Syncope 07/16/2018   Tinnitus    Urge urinary incontinence    Urinary incontinence    Vitamin D deficiency       Past Surgical History:  Procedure Laterality Date   CERVICAL FUSION  2011   CHOLECYSTECTOMY N/A 06/14/2014    Procedure: LAPAROSCOPIC CHOLECYSTECTOMY WITH attempted INTRAOPERATIVE CHOLANGIOGRAM;  Surgeon: Shann Medal, MD;  Location: [facility];  Service: General;  Laterality: N/A;   INSERTION OF MESH N/A 07/18/2015   Procedure: INSERTION OF MESH;  Surgeon: Alphonsa Overall, MD;  Location: [facility];  Service: General;  Laterality: N/A;   KNEE CLOSED REDUCTION Left 09/21/2013   Procedure: CLOSED MANIPULATION LEFT KNEE;  Surgeon: Mauri Pole, MD;  Location: [facility];  Service: Orthopedics;  Laterality: Left;   TOTAL ABDOMINAL HYSTERECTOMY W/ BILATERAL SALPINGOOPHORECTOMY  1972   TOTAL KNEE ARTHROPLASTY Left 08/03/2013   Procedure: LEFT TOTAL KNEE ARTHROPLASTY;  Surgeon: Mauri Pole, MD;  Location: [facility];  Service: Orthopedics;  Laterality: Left;   tracheotomy     TYMPANOPLASTY Left    VENTRAL HERNIA REPAIR N/A 07/18/2015   Procedure: LAPAROSCOPIC VENTRAL AND UMBILICAL  HERNIA LYSIS OF ADHESIONS;  Surgeon: Alphonsa Overall, MD;  Location: [facility];  Service: General;  Laterality: N/A;    Social History:  Ambulatory   independently      reports that she has never smoked. She has never used smokeless tobacco. She reports that she does not drink alcohol and does not use drugs.     Family History:   Family History  Problem Relation Age of Onset   Stroke Mother    Stroke Father    ______________________________________________________________________________________________ Allergies: Allergies  Allergen Reactions   Duloxetine Hcl     Other Reaction(s): jumping legs   Cefuroxime Rash   Codeine Nausea And Vomiting   Zofran [Ondansetron Hcl] Rash     Prior to Admission medications   Medication Sig Start Date End Date Taking? Authorizing Provider  amLODipine (NORVASC) 10 MG tablet Take 1 tablet (10 mg total) by mouth daily. 08/21/22   Patwardhan, Reynold Bowen, MD  cetirizine (ZYRTEC) 10 MG tablet Take 10 mg by mouth daily as needed for rhinitis.    [provider]   cholecalciferol (VITAMIN D) 1000 units tablet Take 2,000 Units by mouth daily.     [provider]  diltiazem (CARDIZEM) 30 MG tablet TAKE 1 TABLET BY MOUTH 3  TIMES DAILY AS NEEDED Patient taking differently: Take 30 mg by mouth 3 (three) times daily as needed (increased heart rate). 11/13/21   Patwardhan, Manish J, MD  ELIQUIS 5 MG TABS tablet TAKE 1 TABLET BY MOUTH TWICE  DAILY 11/12/22   Patwardhan, Manish J, MD  furosemide (LASIX) 20 MG tablet Take 1 tablet (20 mg total) by mouth daily. Patient taking differently: Take 10 mg by mouth daily. 06/20/20   Patwardhan, Reynold Bowen, MD  metoprolol succinate (TOPROL-XL) 50 MG 24 hr tablet TAKE 1 TABLET BY MOUTH  DAILY WITH OR IMMEDIATELY  FOLLOWING A MEAL Patient taking differently: Take 50 mg by mouth daily. 04/18/20   Patwardhan, Reynold Bowen, MD  pantoprazole (PROTONIX) 40 MG tablet Take 40  mg by mouth daily as needed (for acid reflux). 01/26/19   [provider]  potassium chloride (KLOR-CON) 8 MEQ tablet Take 8 mEq by mouth daily.  04/17/15   [provider]  rosuvastatin (CRESTOR) 20 MG tablet TAKE 1 TABLET BY MOUTH DAILY 08/19/22   Patwardhan, Reynold Bowen, MD  traMADol-acetaminophen (ULTRACET) 37.5-325 MG tablet Take 1 tablet by mouth every 6 (six) hours as needed (soft tissue and muscle pain). 08/04/20   [provider]    ___________________________________________________________________________________________________ Physical Exam:    02/12/2023    5:00 PM 02/12/2023    4:00 PM 02/12/2023    3:15 PM  Vitals with BMI  Systolic [id number]  Diastolic 69 88 85  Pulse 60 62 59     1. General:  in No  Acute distress   Chronically ill   -appearing 2. Psychological: Alert and   Oriented 3. Head/ENT:   Dry Mucous Membranes                          Head Non traumatic, neck supple                            Poor Dentition 4. SKIN: normal   Skin turgor,  Skin clean Dry and intact no rash 5. Heart: Regular rate and  rhythm no  Murmur, no Rub or gallop 6. Lungs: no wheezes or crackles   7. Abdomen: Soft, diffusely lower abd -tender, Non distended   obese  bowel sounds present 8. Lower extremities: no clubbing, cyanosis, trace edema 9. Neurologically Grossly intact, moving all 4 extremities equally   10. MSK: Normal range of motion    Chart has been reviewed  ______________________________________________________________________________________________  Assessment/Plan  82 y.o. female with medical history significant of dementia hypertension atrial fibrillation   Admitted for   Hyponatremia     Nausea and vomiting, unspecified vomiting type      Present on Admission:  Hyponatremia  Essential hypertension  Paroxysmal A-fib (HCC)  Mixed hyperlipidemia  Dementia without behavioral disturbance (HCC)  Nausea & vomiting     Hyponatremia   - order urine electrolytes,  Gently rehydrate -Frequent labs Check TSH     Essential hypertension Resume Toprol 50 mg daily  Paroxysmal A-fib (HCC) Continue Eliquis and Toprol 50 mg daily  Mixed hyperlipidemia Continue Crestor 20 mg daily  Dementia without behavioral disturbance (HCC) Monitor for any sign of sundowning  Nausea & vomiting Supportive management gently rehydrate follow electrolytes    Other plan as per orders.  DVT prophylaxis:  SCD      Code Status:    Code Status: Prior FULL CODE as per patient   I had personally discussed CODE STATUS with patient and family     Family Communication:   Family   at  Bedside  plan of care was discussed   with  Husband   Disposition Plan:       To home once workup is complete and patient is stable   Following barriers for discharge:                            Electrolytes corrected                       Consults called: none   Admission status:  ED Disposition     ED Disposition  Admit   Condition  --   Comment  Hospital Area: [facility] [[id number]]   Level of Care: Progressive [102]  Admit to Progressive based on following criteria: MULTISYSTEM THREATS such as stable sepsis, metabolic/electrolyte imbalance with or without encephalopathy that is responding to early treatment.  May admit patient to [facility] or [facility] if equivalent level of care is available:: No  Covid Evaluation: Asymptomatic - no recent exposure (last 10 days) testing not required  Diagnosis: Hyponatremia [id number]  Admitting Physician: Toy Baker [id number]  Attending Physician: Toy Baker [id number]  Certification:: I certify this patient will need inpatient services for at least 2 midnights  Estimated Length of Stay: 2           inpatient     I Expect 2 midnight stay secondary to severity of patient's current illness need for inpatient interventions justified by the following:    Severe lab/radiological/exam abnormalities including:    Hyponatremia  and extensive comorbidities including:  CHF  Dementia   That are currently affecting medical management.   I expect  patient to be hospitalized for 2 midnights requiring inpatient medical care.  Patient is at high risk for adverse outcome (such as loss of life or disability) if not treated.  Indication for inpatient stay as follows:    Need for repeated labs   Need for IV antibiotics, IV fluids, IV rate controling medications, IV antihypertensives, IV pain medications, IV anticoagulation, need for biPAP    Level of care    progressive tele indefinitely please discontinue once patient no longer qualifies COVID-19 Labs    Lab Results  Component Value Date   [place] NEGATIVE 02/12/2023     Precautions: admitted as   Covid Negative         [place] Al 02/12/2023, 10:47 PM    [facility]     after 2 AM please page floor coverage PA If 7AM-7PM, please contact the day team taking care of the patient using [email or web address]   Patient was evaluated in the context of the  global COVID-19 pandemic, which necessitated consideration that the patient might be at risk for infection with the SARS-CoV-2 virus that causes COVID-19. Institutional protocols and algorithms that pertain to the evaluation of patients at risk for COVID-19 are in a state of rapid change based on information released by regulatory bodies including the CDC and federal and state organizations. These policies and algorithms were followed during the patient's care.

## 2023-02-12 NOTE — Assessment & Plan Note (Signed)
Continue Crestor 20 mg daily. 

## 2023-02-12 NOTE — Assessment & Plan Note (Signed)
Monitor for any sign of sundowning 

## 2023-02-12 NOTE — Assessment & Plan Note (Signed)
Resume Toprol 50 mg daily

## 2023-02-12 NOTE — Assessment & Plan Note (Signed)
Continue Eliquis and Toprol 50 mg daily

## 2023-02-12 NOTE — Subjective & Objective (Signed)
Patient was brought in complaining of nausea vomiting decreased p.o. intake last bowel movement 2 days ago patient has mild dementia and atrial fibrillation Has been having abdominal pain for the past 2 days couple episodes of nausea and vomiting no chest pain Reports cramping intermittent abdominal pain she is status postcholecystectomy and hysterectomy no fevers or chills

## 2023-02-12 NOTE — ED Provider Triage Note (Signed)
Emergency Medicine Provider Triage Evaluation Note  Phyllis Knight , a 82 y.o. female  was evaluated in triage.  Pt complains of abd pain for 2 days constant abd pain and has had several ep of vomiting.   No CP or SOB.   Currently being worked up for dementia.   Review of Systems  Positive: Abd pain, NV Negative: Fever   Physical Exam  BP (!) 139/104 (BP Location: Left Arm)   Pulse 67   Temp 97.6 F (36.4 C) (Oral)   Resp 18   SpO2 100%  Gen:   Awake, no distress   Resp:  Normal effort  MSK:   Moves extremities without difficulty  Other:  Abd mildly TTP no guarding or rebound. AO to date/time, self, location but seems mentally slow.   Medical Decision Making  Medically screening exam initiated at 1:38 PM.  Appropriate orders placed.  Phyllis Knight was informed that the remainder of the evaluation will be completed by another provider, this initial triage assessment does not replace that evaluation, and the importance of remaining in the ED until their evaluation is complete.  Labs, CT AP, DG Chest   Phyllis Knight Phyllis Knight, [place] 02/12/23 1341

## 2023-02-12 NOTE — Assessment & Plan Note (Signed)
Supportive management gently rehydrate follow electrolytes

## 2023-02-12 NOTE — ED Provider Notes (Signed)
[place] [facility] DEPARTMENT AT [facility] Provider Note   CSN: [id number] Arrival date & time: 02/12/23  1315     History {Add pertinent medical, surgical, social history, OB history to HPI:1} Chief Complaint  Patient presents with   Emesis    Phyllis Knight is a 82 y.o. female presenting to the ED with poor appetite, vomiting, no bowel movement in 2 days.  She reports cramping intermittent abdominal pain.  Reports a history of a cholecystectomy and a hysterectomy but no other abdominal surgeries.  She denies any fevers or chills.  She is here with her husband.  HPI     Home Medications Prior to Admission medications   Medication Sig Start Date End Date Taking? Authorizing Provider  amLODipine (NORVASC) 10 MG tablet Take 1 tablet (10 mg total) by mouth daily. 08/21/22   Patwardhan, Reynold Bowen, MD  cetirizine (ZYRTEC) 10 MG tablet Take 10 mg by mouth daily as needed for rhinitis.    [provider]  cholecalciferol (VITAMIN D) 1000 units tablet Take 2,000 Units by mouth daily.     [provider]  diltiazem (CARDIZEM) 30 MG tablet TAKE 1 TABLET BY MOUTH 3  TIMES DAILY AS NEEDED Patient taking differently: Take 30 mg by mouth 3 (three) times daily as needed (increased heart rate). 11/13/21   Patwardhan, Manish J, MD  ELIQUIS 5 MG TABS tablet TAKE 1 TABLET BY MOUTH TWICE  DAILY 11/12/22   Patwardhan, Manish J, MD  furosemide (LASIX) 20 MG tablet Take 1 tablet (20 mg total) by mouth daily. Patient taking differently: Take 10 mg by mouth daily. 06/20/20   Patwardhan, Reynold Bowen, MD  metoprolol succinate (TOPROL-XL) 50 MG 24 hr tablet TAKE 1 TABLET BY MOUTH  DAILY WITH OR IMMEDIATELY  FOLLOWING A MEAL Patient taking differently: Take 50 mg by mouth daily. 04/18/20   Patwardhan, Reynold Bowen, MD  pantoprazole (PROTONIX) 40 MG tablet Take 40 mg by mouth daily as needed (for acid reflux). 01/26/19   [provider]  potassium chloride (KLOR-CON) 8 MEQ tablet  Take 8 mEq by mouth daily.  04/17/15   [provider]  rosuvastatin (CRESTOR) 20 MG tablet TAKE 1 TABLET BY MOUTH DAILY 08/19/22   Patwardhan, Reynold Bowen, MD  traMADol-acetaminophen (ULTRACET) 37.5-325 MG tablet Take 1 tablet by mouth every 6 (six) hours as needed (soft tissue and muscle pain). 08/04/20   [provider]      Allergies    Duloxetine hcl, Cefuroxime, Codeine, and Zofran [ondansetron hcl]    Review of Systems   Review of Systems  Physical Exam Updated Vital Signs BP 133/88   Pulse 62   Temp 98 F (36.7 C) (Oral)   Resp 18   SpO2 99%  Physical Exam Constitutional:      General: She is not in acute distress. HENT:     Head: Normocephalic and atraumatic.  Eyes:     Conjunctiva/sclera: Conjunctivae normal.     Pupils: Pupils are equal, round, and reactive to light.  Cardiovascular:     Rate and Rhythm: Normal rate and regular rhythm.  Pulmonary:     Effort: Pulmonary effort is normal. No respiratory distress.  Abdominal:     General: There is no distension.     Tenderness: There is no abdominal tenderness.  Skin:    General: Skin is warm and dry.  Neurological:     General: No focal deficit present.     Mental Status: She is alert. Mental  status is at baseline.  Psychiatric:        Mood and Affect: Mood normal.        Behavior: Behavior normal.     ED Results / Procedures / Treatments   Labs (all labs ordered are listed, but only abnormal results are displayed) Labs Reviewed  COMPREHENSIVE METABOLIC PANEL - Abnormal; Notable for the following components:      Result Value   Sodium 123 (*)    Chloride 91 (*)    Calcium 8.3 (*)    Total Bilirubin 1.6 (*)    All other components within normal limits  LIPASE, BLOOD  CBC  URINALYSIS, ROUTINE W REFLEX MICROSCOPIC    EKG None  Radiology No results found.  Procedures Procedures  {Document cardiac monitor, telemetry assessment procedure when appropriate:1}  Medications Ordered in  ED Medications - No data to display  ED Course/ Medical Decision Making/ A&P   {   Click here for ABCD2, HEART and other calculatorsREFRESH Note before signing :1}                          Medical Decision Making  This patient presents to the ED with concern for abdominal discomfort, nausea, no BM. This involves an extensive number of treatment options, and is a complaint that carries with it a high risk of complications and morbidity.  The differential diagnosis includes ileus versus obstruction versus pancreatitis versus other  Co-morbidities that complicate the patient evaluation: Prior abdominal surgeries or risk factor for bowel obstruction  Additional history obtained from patient's husband at the bedside  I ordered and personally interpreted labs.  The pertinent results include: Hyponatremia and hypochloremia.  I ordered imaging studies including CT abdomen pelvis I independently visualized and interpreted imaging which showed *** I agree with the radiologist interpretation  The patient was maintained on a cardiac monitor.  I personally viewed and interpreted the cardiac monitored which showed an underlying rhythm of: Sinus rhythm  I ordered medication including normal saline for hyponatremia  I have reviewed the patients home medicines and have made adjustments as needed  After the interventions noted above, I reevaluated the patient and found that they have: stayed the same   Patient sodium chloride levels have fallen in 2 weeks from normal to now abnormal.  She does not demonstrate evidence of confusion, seizure, altered mental status to warrant hypertonic saline or ICU admission at this time.  However given the degree of change, I do anticipate she will need hospitalization.   Dispostion:  ***   {Document critical care time when appropriate:1} {Document review of labs and clinical decision tools ie heart score, Chads2Vasc2 etc:1}  {Document your independent review  of radiology images, and any outside records:1} {Document your discussion with family members, caretakers, and with consultants:1} {Document social determinants of health affecting pt's care:1} {Document your decision making why or why not admission, treatments were needed:1} Final Clinical Impression(s) / ED Diagnoses Final diagnoses:  None    Rx / DC Orders ED Discharge Orders     None

## 2023-02-12 NOTE — Assessment & Plan Note (Signed)
 -   order urine electrolytes,  Gently rehydrate -Frequent labs Check TSH

## 2023-02-12 NOTE — ED Triage Notes (Signed)
C/o constipation with n/v Denies abd pain.   LBM: 2 days ago.

## 2023-02-12 NOTE — ED Provider Notes (Signed)
Patient signed out to me at 1700 by Dr. Langston Masker pending urine and CT abdomen and pelvis.  In short this is an 82 year old female with a past medical history of hypertension, fibromyalgia and GERD and currently being evaluated for dementia that presented to the emergency department with generalized abdominal pain, nausea and vomiting.  Her workup showed a new hyponatremia otherwise within normal range.  Her urine is negative.  CT abdomen and pelvis showed no obvious explanation for her symptoms.  On my evaluation, the patient states that she is still having some mild pain and nausea but has had no vomiting since she has been in the ED.  She is currently receiving IV fluids.  Patient's abdomen is soft and has no point tenderness on exam.  Patient will be given Reglan and Tylenol for symptomatic management and will be admitted for her hyponatremia.   Kemper Durie, DO 02/12/23 [phone]

## 2023-02-13 DIAGNOSIS — K2900 Acute gastritis without bleeding: Secondary | ICD-10-CM | POA: Diagnosis not present

## 2023-02-13 DIAGNOSIS — E871 Hypo-osmolality and hyponatremia: Secondary | ICD-10-CM | POA: Diagnosis not present

## 2023-02-13 LAB — COMPREHENSIVE METABOLIC PANEL
ALT: 11 U/L (ref 0–44)
AST: 21 U/L (ref 15–41)
Albumin: 3.5 g/dL (ref 3.5–5.0)
Alkaline Phosphatase: 53 U/L (ref 38–126)
Anion gap: 8 (ref 5–15)
BUN: 9 mg/dL (ref 8–23)
CO2: 22 mmol/L (ref 22–32)
Calcium: 8.7 mg/dL — ABNORMAL LOW (ref 8.9–10.3)
Chloride: 106 mmol/L (ref 98–111)
Creatinine, Ser: 0.64 mg/dL (ref 0.44–1.00)
GFR, Estimated: >60 mL/min (ref >60)
Glucose, Bld: 83 mg/dL (ref 70–99)
Potassium: 3.9 mmol/L (ref 3.5–5.1)
Sodium: 136 mmol/L (ref 135–145)
Total Bilirubin: 1 mg/dL (ref 0.3–1.2)
Total Protein: 6.5 g/dL (ref 6.5–8.1)

## 2023-02-13 LAB — CBC
HCT: 35.8 % — ABNORMAL LOW (ref 36.0–46.0)
Hemoglobin: 12.3 g/dL (ref 12.0–15.0)
MCH: 31.7 pg (ref 26.0–34.0)
MCHC: 34.4 g/dL (ref 30.0–36.0)
MCV: 92.3 fL (ref 80.0–100.0)
Platelets: 194 10*3/uL (ref 150–400)
RBC: 3.88 MIL/uL (ref 3.87–5.11)
RDW: 12.3 % (ref 11.5–15.5)
WBC: 3.9 10*3/uL — ABNORMAL LOW (ref 4.0–10.5)
nRBC: 0 % (ref 0.0–0.2)

## 2023-02-13 LAB — MAGNESIUM: Magnesium: 2.1 mg/dL (ref 1.7–2.4)

## 2023-02-13 LAB — OSMOLALITY: Osmolality: 281 mOsm/kg (ref 275–295)

## 2023-02-13 LAB — PREALBUMIN: Prealbumin: 17 mg/dL — ABNORMAL LOW (ref 18–38)

## 2023-02-13 LAB — OSMOLALITY, URINE: Osmolality, Ur: 37 mOsm/kg — ABNORMAL LOW (ref 300–900)

## 2023-02-13 LAB — CK: Total CK: 301 U/L — ABNORMAL HIGH (ref 38–234)

## 2023-02-13 LAB — PHOSPHORUS: Phosphorus: 4.3 mg/dL (ref 2.5–4.6)

## 2023-02-13 MED ORDER — PANTOPRAZOLE SODIUM 40 MG PO TBEC
40.0000 mg | DELAYED_RELEASE_TABLET | Freq: Two times a day (BID) | ORAL | Status: DC
  Administered 2023-02-13 – 2023-02-14 (×3): 40 mg via ORAL
  Filled 2023-02-13 (×3): qty 1

## 2023-02-13 NOTE — Progress Notes (Signed)
SATURATION QUALIFICATIONS: (This note is used to comply with regulatory documentation for home oxygen)  Patient Saturations on Room Air at Rest = 99%  Patient Saturations on Room Air while Ambulating = 94%  Please briefly explain why patient needs home oxygen: This pt does not need home O2.

## 2023-02-13 NOTE — Evaluation (Signed)
Physical Therapy Evaluation Patient Details Name: Phyllis Knight MRN: [id number] DOB: 21-Jul-1941 Today's Date: 02/13/2023  History of Present Illness  Phyllis Knight is a 82 y.o. female with medical history significant of dementia, hypertension, atrial fibrillation. She presented to ED with c/o nausea, vomitting and constipation. Admitted dx: hyponatremia  Clinical Impression  Pt admitted with above diagnosis.  Pt currently with functional limitations due to the deficits listed below (see PT Problem List). Pt will benefit from skilled PT to increase their independence and safety with mobility to allow discharge to the venue listed below.  Pt with hx of dementia but reports she lives at home with her husband and uses walker at baseline.  Pt requiring min assist today with transfers and limited ambulation due to right knee pain.  Pt anticipates return home upon d/c.  Would recommend initial assist for mobility upon d/c at this time for safety.         Recommendations for follow up therapy are one component of a multi-disciplinary discharge planning process, led by the attending physician.  Recommendations may be updated based on patient status, additional functional criteria and insurance authorization.  Follow Up Recommendations Home health PT      Assistance Recommended at Discharge PRN  Patient can return home with the following  A little help with walking and/or transfers;A little help with bathing/dressing/bathroom;Assist for transportation;Help with stairs or ramp for entrance    Equipment Recommendations None recommended by PT  Recommendations for Other Services       Functional Status Assessment Patient has had a recent decline in their functional status and demonstrates the ability to make significant improvements in function in a reasonable and predictable amount of time.     Precautions / Restrictions Precautions Precautions: Fall      Mobility  Bed Mobility                General bed mobility comments: pt in recliner    Transfers Overall transfer level: Needs assistance Equipment used: Rolling walker (2 wheels) Transfers: Sit to/from Stand Sit to Stand: Min assist           General transfer comment: assist to rise and steady, cues for hand placement, cues for remaining within RW and talking RW to chair for return to sitting    Ambulation/Gait Ambulation/Gait assistance: Min guard Gait Distance (Feet): 50 Feet Assistive device: Rolling walker (2 wheels) Gait Pattern/deviations: Step-through pattern, Decreased stance time - right, Antalgic Gait velocity: decr     General Gait Details: antalgic gait due to right knee pain, encouraged use of UEs through RW, distance per pt tolerance, recliner followed for safety however not needed, pt denies dizziness  Stairs            Wheelchair Mobility    Modified Rankin (Stroke Patients Only)       Balance Overall balance assessment: Needs assistance         Standing balance support: Bilateral upper extremity supported, During functional activity, Reliant on assistive device for balance Standing balance-Leahy Scale: Poor                               Pertinent Vitals/Pain Pain Assessment Pain Assessment: Faces Faces Pain Scale: Hurts little more Pain Location: R knee pain Pain Descriptors / Indicators: Grimacing, Discomfort Pain Intervention(s): Repositioned, Monitored during session ("needs surgery")    Home Living Family/patient expects to be discharged to:: Private residence Living  Arrangements: Spouse/significant other Available Help at Discharge: Family;Available 24 hours/day Type of Home: House Home Access: Level entry       Home Layout: One level Home Equipment: Cane - single Barista (2 wheels);BSC/3in1;Toilet riser;Shower seat Additional Comments: Per chart review - pt with h/o dementia, no family present to confirm    Prior  Function Prior Level of Function : Needs assist;Patient poor historian/Family not available             Mobility Comments: pt states she uses a walker       Hand Dominance   Dominant Hand: Right    Extremity/Trunk Assessment        Lower Extremity Assessment Lower Extremity Assessment: Generalized weakness;RLE deficits/detail RLE Deficits / Details: reports right knee pain (unable to recall timeline of pain), states she needs surgery "like my other knee" (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)       Communication   Communication: No difficulties  Cognition Arousal/Alertness: Awake/alert Behavior During Therapy: WFL for tasks assessed/performed Overall Cognitive Status: History of cognitive impairments - at baseline                                 General Comments: Pt with h/o dementia at baseline, no family present        General Comments      Exercises     Assessment/Plan    PT Assessment Patient needs continued PT services  PT Problem List Decreased strength;Decreased balance;Decreased mobility;Decreased knowledge of use of DME;Decreased activity tolerance       PT Treatment Interventions Gait training;DME instruction;Therapeutic exercise;Functional mobility training;Therapeutic activities;Patient/family education;Balance training;Stair training    PT Goals (Current goals can be found in the Care Plan section)  Acute Rehab PT Goals PT Goal Formulation: With patient Time For Goal Achievement: 02/27/23 Potential to Achieve Goals: Good    Frequency Min 3X/week     Co-evaluation               AM-PAC PT "6 Clicks" Mobility  Outcome Measure Help needed turning from your back to your side while in a flat bed without using bedrails?: A Little Help needed moving from lying on your back to sitting on the side of a flat bed without using bedrails?: A Little Help needed moving to and from a bed to a chair (including a wheelchair)?: A Little Help needed  standing up from a chair using your arms (e.g., wheelchair or bedside chair)?: A Little Help needed to walk in hospital room?: A Little Help needed climbing 3-5 steps with a railing? : A Lot 6 Click Score: 17    End of Session Equipment Utilized During Treatment: Gait belt Activity Tolerance: Patient tolerated treatment well Patient left: in chair;with call bell/phone within reach;with chair alarm set Nurse Communication: Mobility status PT Visit Diagnosis: Difficulty in walking, not elsewhere classified (R26.2)    Time: [id number] PT Time Calculation (min) (ACUTE ONLY): 10 min   Charges:   PT Evaluation $PT Eval Low Complexity: 1 Low         Kati PT, DPT Physical Therapist Acute Rehabilitation Services Preferred contact method: Secure Chat Weekend Pager Only: [phone] Office: [phone]   Myrtis Hopping [place] 02/13/2023, 3:27 PM

## 2023-02-13 NOTE — Progress Notes (Signed)
[facility] PROGRESS NOTE   Phyllis Knight [id number] DOB: 1941-05-23 DOA: 02/12/2023  PCP: Carol Ada, MD  Brief History/Interval Summary: 82 y.o. female with medical history significant of dementia hypertension atrial fibrillation who presented with nausea vomiting and abdominal pain.  Has not had any bowel movement in several days.  However has also had poor oral intake.  Patient was noted to have hyponatremia.  She was hospitalized for further management.   Consultants: None  Procedures: None    Subjective/Interval History: Patient states that she is feeling better today compared to yesterday.  Denies any abdominal pain nausea or vomiting.  Has been passing gas from below.  Husband is at the bedside.    Assessment/Plan:  Nausea and vomiting Likely mild gastritis.  Seems to have resolved.  Slightly tender in the epigastric area.  Will place her on PPI.  Advance diet. CT of the abdomen pelvis does not show any acute findings.  No bowel obstruction or constipation noted.  Hyponatremia Most likely due to hypovolemia. Came in with a sodium level of 123.  She was given IV fluids with perhaps a to rapid improvement in sodium level.  Noted to be 136 this morning.  Will stop IV fluids.  Recheck sodium tomorrow morning.  Hypokalemia Repleted.  Paroxysmal atrial fibrillation Continue Eliquis and metoprolol.  Essential hypertension Monitor blood pressure closely.  Hyperlipidemia Continue with statin.  History of dementia Stable.  No behavioral issues noted.   Obesity Estimated body mass index is 32.91 kg/m as calculated from the following:   Height as of this encounter: '5\' 6"'$  (1.676 m).   Weight as of this encounter: 92.5 kg.   DVT Prophylaxis: Apixaban Code Status: Full code Family Communication: Discussed with patient's husband Disposition Plan: Hopefully return home when improved.  Start mobilizing.  Status is: Inpatient Remains inpatient  appropriate because: Hyponatremia      Medications: Scheduled:  amLODipine  10 mg Oral Daily   apixaban  5 mg Oral BID   metoprolol succinate  50 mg Oral Daily   rosuvastatin  20 mg Oral Daily   Continuous: [id number] **OR** acetaminophen, HYDROcodone-acetaminophen, pantoprazole  Antibiotics: Anti-infectives (From admission, onward)    None       Objective:  Vital Signs  Vitals:   02/12/23 2043 02/12/23 2047 02/12/23 2331 02/13/23 0428  BP:  (!) 156/70 136/71 (!) 113/59  Pulse:  65 60 71  Resp:  '18 18 18  '$ Temp:  98.2 F (36.8 C) 97.6 F (36.4 C) 98.4 F (36.9 C)  TempSrc:  Oral Oral Oral  SpO2:  97% 98% 95%  Weight: 92.5 kg     Height: '5\' 6"'$  (1.676 m)       Intake/Output Summary (Last 24 hours) at 02/13/2023 1142 Last data filed at 02/13/2023 0856 Gross per 24 hour  Intake 842.64 ml  Output 1000 ml  Net -157.36 ml   Filed Weights   02/12/23 2043  Weight: 92.5 kg    General appearance: Awake alert.  In no distress.  Mildly distracted Resp: Clear to auscultation bilaterally.  Normal effort Cardio: S1-S2 is normal regular.  No S3-S4.  No rubs murmurs or bruit GI: Abdomen is soft.  Nontender nondistended.  Bowel sounds are present normal.  No masses organomegaly Extremities: No edema.  Full range of motion of lower extremities. Neurologic: No focal neurological deficits.    Lab Results:  Data Reviewed: I have personally reviewed following labs and reports of the imaging studies  CBC: Recent Labs  Lab 02/12/23 1342 02/13/23 0418  WBC 5.6 3.9*  HGB 13.7 12.3  HCT 39.3 35.8*  MCV 91.0 92.3  PLT 188 [id number]    Basic Metabolic Panel: Recent Labs  Lab 02/12/23 1342 02/12/23 1852 02/12/23 1923 02/13/23 0418  NA 123* 130*  --  136  K 3.5 3.4*  --  3.9  CL 91* 96*  --  106  CO2 23 23  --  22  GLUCOSE 98 83  --  83  BUN 8 7*  --  9  CREATININE 0.52 0.49  --  0.64  CALCIUM 8.3* 8.6*  --  8.7*  MG  --   --  1.8 2.1  PHOS  --   --  3.5 4.3     GFR: Estimated Creatinine Clearance: 62.1 mL/min (by C-G formula based on SCr of 0.64 mg/dL).  Liver Function Tests: Recent Labs  Lab 02/12/23 1342 02/13/23 0418  AST 26 21  ALT 13 11  ALKPHOS 59 53  BILITOT 1.6* 1.0  PROT 7.6 6.5  ALBUMIN 4.0 3.5    Recent Labs  Lab 02/12/23 1342  LIPASE 43     Cardiac Enzymes: Recent Labs  Lab 02/12/23 1923 02/13/23 0418  CKTOTAL 539* 301*    Thyroid Function Tests: Recent Labs    02/12/23 1849  TSH 1.066      Recent Results (from the past 240 hour(s))  Resp panel by RT-PCR (RSV, Flu A&B, Covid) Anterior Nasal Swab     Status: None   Collection Time: 02/12/23  6:31 PM   Specimen: Anterior Nasal Swab  Result Value Ref Range Status   SARS Coronavirus 2 by RT PCR NEGATIVE NEGATIVE Final    Comment: (NOTE) SARS-CoV-2 target nucleic acids are NOT DETECTED.  The SARS-CoV-2 RNA is generally detectable in upper respiratory specimens during the acute phase of infection. The lowest concentration of SARS-CoV-2 viral copies this assay can detect is 138 copies/mL. A negative result does not preclude SARS-Cov-2 infection and should not be used as the sole basis for treatment or other patient management decisions. A negative result may occur with  improper specimen collection/handling, submission of specimen other than nasopharyngeal swab, presence of viral mutation(s) within the areas targeted by this assay, and inadequate number of viral copies(<138 copies/mL). A negative result must be combined with clinical observations, patient history, and epidemiological information. The expected result is Negative.  Fact Sheet for Patients:  EntrepreneurPulse.com.au  Fact Sheet for Healthcare Providers:  IncredibleEmployment.be  This test is no t yet approved or cleared by the [place] FDA and  has been authorized for detection and/or diagnosis of SARS-CoV-2 by FDA under an Emergency Use  Authorization (EUA). This EUA will remain  in effect (meaning this test can be used) for the duration of the COVID-19 declaration under Section 564(b)(1) of the Act, 21 U.S.C.section 360bbb-3(b)(1), unless the authorization is terminated  or revoked sooner.       Influenza A by PCR NEGATIVE NEGATIVE Final   Influenza B by PCR NEGATIVE NEGATIVE Final    Comment: (NOTE) The Xpert Xpress SARS-CoV-2/FLU/RSV plus assay is intended as an aid in the diagnosis of influenza from Nasopharyngeal swab specimens and should not be used as a sole basis for treatment. Nasal washings and aspirates are unacceptable for Xpert Xpress SARS-CoV-2/FLU/RSV testing.  Fact Sheet for Patients: EntrepreneurPulse.com.au  Fact Sheet for Healthcare Providers: IncredibleEmployment.be  This test is not yet approved or cleared by the [place] FDA and has been authorized for detection  and/or diagnosis of SARS-CoV-2 by FDA under an Emergency Use Authorization (EUA). This EUA will remain in effect (meaning this test can be used) for the duration of the COVID-19 declaration under Section 564(b)(1) of the Act, 21 U.S.C. section 360bbb-3(b)(1), unless the authorization is terminated or revoked.     Resp Syncytial Virus by PCR NEGATIVE NEGATIVE Final    Comment: (NOTE) Fact Sheet for Patients: EntrepreneurPulse.com.au  Fact Sheet for Healthcare Providers: IncredibleEmployment.be  This test is not yet approved or cleared by the [place] FDA and has been authorized for detection and/or diagnosis of SARS-CoV-2 by FDA under an Emergency Use Authorization (EUA). This EUA will remain in effect (meaning this test can be used) for the duration of the COVID-19 declaration under Section 564(b)(1) of the Act, 21 U.S.C. section 360bbb-3(b)(1), unless the authorization is terminated or revoked.  Performed at [facility],  [place] [address], [place], [place] [address]       Radiology Studies: DG Chest 1 View  Result Date: 02/12/2023 CLINICAL DATA:  Nausea and vomiting EXAM: CHEST  1 VIEW COMPARISON:  01/29/2023 FINDINGS: Stable cardiomediastinal silhouette. Aortic atherosclerotic calcification. Chronic bronchitic changes. No focal consolidation, pleural effusion, or pneumothorax. No acute osseous abnormality. IMPRESSION: No active disease. Electronically Signed   By: Placido Sou M.D.   On: 02/12/2023 19:01   CT ABDOMEN PELVIS W CONTRAST  Result Date: 02/12/2023 CLINICAL DATA:  Abdominal pain for 2 days, vomiting, constipation EXAM: CT ABDOMEN AND PELVIS WITH CONTRAST TECHNIQUE: Multidetector CT imaging of the abdomen and pelvis was performed using the standard protocol following bolus administration of intravenous contrast. RADIATION DOSE REDUCTION: This exam was performed according to the departmental dose-optimization program which includes automated exposure control, adjustment of the mA and/or kV according to patient size and/or use of iterative reconstruction technique. CONTRAST:  17m OMNIPAQUE IOHEXOL 300 MG/ML  SOLN COMPARISON:  05/20/2014 FINDINGS: Lower chest: No acute abnormality.  Coronary artery calcifications. Hepatobiliary: No focal liver abnormality is seen. Status post cholecystectomy. No biliary dilatation. Pancreas: Unremarkable. No pancreatic ductal dilatation or surrounding inflammatory changes. Spleen: Normal in size without significant abnormality. Adrenals/Urinary Tract: Adrenal glands are unremarkable. Kidneys are normal, without renal calculi, solid lesion, or hydronephrosis. Bladder is unremarkable. Stomach/Bowel: Stomach is within normal limits. Appendix is not clearly visualized and may be surgically absent no evidence of bowel wall thickening, distention, or inflammatory changes. Sigmoid diverticula. Vascular/Lymphatic: Aortic atherosclerosis. No enlarged abdominal or pelvic lymph nodes.  Reproductive: Status post hysterectomy. Other: Ventral hernia mesh repair. Musculoskeletal: No acute or significant osseous findings. IMPRESSION: 1. No acute CT findings of the abdomen or pelvis to explain abdominal pain. Specifically, no evidence of bowel obstruction or constipation. 2. Sigmoid diverticulosis without evidence of acute diverticulitis. 3. Status post cholecystectomy, hysterectomy, and ventral hernia mesh repair. 4. Coronary artery disease. Aortic Atherosclerosis (ICD10-I70.0). Electronically Signed   By: ADelanna AhmadiM.D.   On: 02/12/2023 17:04       LOS: 1 day   [facility] Pager on [email or web address]  02/13/2023, 11:42 AM

## 2023-02-13 NOTE — Progress Notes (Signed)
PT has denied N/V this shift. Pt has reported discomfort with knees, declined pain medication. Pt has been up to chair. Ambulating SPO2 completed this shift. IV fluids stopped per MD order. Pt left with call light within reach.

## 2023-02-13 NOTE — Evaluation (Signed)
Occupational Therapy Evaluation Patient Details Name: Phyllis Knight MRN: [id number] DOB: [birth year] Today's Date: 02/13/2023   History of Present Illness Oleda L Murtagh is a 82 y.o. female with medical history significant of dementia, hypertension, atrial fibrillation. She presented to ED with c/o nausea, vomitting and constipation. Admitted dx: hyponatremia   Clinical Impression   Pt was admitted as above, presenting with deficits as listed below (Please refer to OT problem list below). Pt w/ h/o dementia and is poor historian, no family present to confirm. Pt lives with spouse in 1 story home, has necessary DME (per chart review). Pt is hopeful to return home with husband when medically able. Pt is overall Min-Mod A LB ADL's, Min-min guard assist for functional mobility/transfers using RW. Pt benefits from verbal and tactile cues for safety and sequencing overall. Pt bed was wet upon OT arrival and pt was assisted with cleaning up, removing linens, changing gown, socks etc, RN staff was made aware. Session was concluded with pt up in chair, call bell in reach and chair alarm on. Will follow acutely for OT to assist in maximizing independence with ADL and functional transfers.     Recommendations for follow up therapy are one component of a multi-disciplinary discharge planning process, led by the attending physician.  Recommendations may be updated based on patient status, additional functional criteria and insurance authorization.   Follow Up Recommendations  Home health OT     Assistance Recommended at Discharge Intermittent Supervision/Assistance  Patient can return home with the following A little help with walking and/or transfers;A little help with bathing/dressing/bathroom;Assistance with cooking/housework;Direct supervision/assist for medications management;Assist for transportation;Help with stairs or ramp for entrance    Functional Status Assessment  Patient has had a recent  decline in their functional status and demonstrates the ability to make significant improvements in function in a reasonable and predictable amount of time.  Equipment Recommendations  Other (comment) (Per chart review, pt has necessary DME)    Recommendations for Other Services       Precautions / Restrictions Precautions Precautions: Fall Restrictions Weight Bearing Restrictions: No      Mobility Bed Mobility Overal bed mobility: Needs Assistance Bed Mobility: Supine to Sit   Supine to sit: Modified independent (Device/Increase time), HOB elevated   General bed mobility comments: Increased time and min verbal and tactile cues required    Transfers Overall transfer level: Needs assistance Equipment used: Rolling walker (2 wheels) Transfers: Sit to/from Stand, Bed to chair/wheelchair/BSC Sit to Stand: Min assist, From elevated surface   Step pivot transfers: Min guard, Min assist   General transfer comment: Min A with initial stand to steady from EOB, Min A during transfer for controlled desent to chair as pt was noted to let go of RW and attempt to sit before she was all the way to the chair.      Balance Overall balance assessment: Needs assistance Sitting-balance support: No upper extremity supported, Bilateral upper extremity supported, Feet supported Sitting balance-Leahy Scale: Good   Standing balance support: Bilateral upper extremity supported, During functional activity, Reliant on assistive device for balance Standing balance-Leahy Scale: Poor Standing balance comment: Pt required vc, tc's for safe standing using RW (attempting to release RW ans sit prior to being safely back against chair - pt reports that she has RW at home but prefers [facility]     ADL either performed or assessed with clinical judgement   ADL Overall ADL's : Needs assistance/impaired Eating/Feeding: Set up;Bed level;Sitting  Grooming: Wash/dry hands;Wash/dry face;Set up;Sitting Grooming  Details (indicate cue type and reason): Sitting up at EOB Upper Body Bathing: Set up;Sitting   Lower Body Bathing: Sitting/lateral leans;Sit to/from stand;Moderate assistance;Minimal assistance   Upper Body Dressing : Set up;Min guard;Sitting   Lower Body Dressing: Sit to/from stand;Sitting/lateral leans;Moderate assistance;Minimal assistance Lower Body Dressing Details (indicate cue type and reason): Donning socks seated Toilet Transfer: Rolling walker (2 wheels);BSC/3in1;Stand-pivot;Min guard;Minimal assistance Toilet Transfer Details (indicate cue type and reason): Simulated - sit to stand from EOB and amb to chair using RW. VC's for hand palcement and safety/sequencing with RW Toileting- Clothing Manipulation and Hygiene: Minimal assistance;Sitting/lateral lean;Sit to/from stand   Functional mobility during ADLs: Min guard;Minimal assistance;Rolling walker (2 wheels);Cueing for safety;Cueing for sequencing General ADL Comments: Pt was educated in role of OT and recommendations for Min-mod A for LB ADL's for safety. Pt is poor historian, No family present but chart review indicates that pt lives with husband in 1 story home. Per pt, husband and children/family able to assist PRN at d/c. Pt's bed/linens were wet upon OT arrival, therefore pt was assisted in cleaning up, changing and transferred to chair. RN staff notified that pt PureWick needed to be changed as pt had wet linens/bed.     Vision Patient Visual Report: No change from baseline Vision Assessment?: No apparent visual deficits            Pertinent Vitals/Pain Pain Assessment Pain Assessment: Faces Faces Pain Scale: Hurts little more Pain Location: R knee pain Pain Descriptors / Indicators: Grimacing, Discomfort Pain Intervention(s): Limited activity within patient's tolerance, Monitored during session, Repositioned     Hand Dominance Right   Extremity/Trunk Assessment Upper Extremity Assessment Upper Extremity  Assessment: Overall WFL for tasks assessed;Generalized weakness   Lower Extremity Assessment Lower Extremity Assessment: Defer to PT evaluation     Communication Communication Communication: No difficulties   Cognition Arousal/Alertness: Awake/alert Behavior During Therapy: WFL for tasks assessed/performed Overall Cognitive Status: History of cognitive impairments - at baseline   General Comments: Pt with h/o dementia at baseline, no family present. Lives with spouse     General Comments  VSS on RA            Home Living Family/patient expects to be discharged to:: Private residence Living Arrangements: Spouse/significant other Available Help at Discharge: Family;Available 24 hours/day Type of Home: House Home Access: Level entry   Home Layout: One level   Bathroom Shower/Tub: Teacher, early years/pre: Standard   Home Equipment: Cane - single Barista (2 wheels);BSC/3in1;Toilet riser;Shower seat   Additional Comments: Per chart review - pt with h/o dementia, no family present to confirm      Prior Functioning/Environment Prior Level of Function : Needs assist;Patient poor historian/Family not available         OT Problem List: Impaired balance (sitting and/or standing);Decreased knowledge of precautions;Pain;Decreased safety awareness;Decreased activity tolerance;Decreased knowledge of use of DME or AE      OT Treatment/Interventions: Self-care/ADL training;DME and/or AE instruction;Therapeutic activities;Patient/family education;Cognitive remediation/compensation    OT Goals(Current goals can be found in the care plan section) Acute Rehab OT Goals Patient Stated Goal: Go home with husband OT Goal Formulation: Patient unable to participate in goal setting Time For Goal Achievement: 02/27/23 Potential to Achieve Goals: Good  OT Frequency: Min 2X/week       AM-PAC OT "6 Clicks" Daily Activity     Outcome Measure Help from another person  eating meals?: A Little Help from another  person taking care of personal grooming?: A Little Help from another person toileting, which includes using toliet, bedpan, or urinal?: A Lot Help from another person bathing (including washing, rinsing, drying)?: A Little Help from another person to put on and taking off regular upper body clothing?: A Little Help from another person to put on and taking off regular lower body clothing?: A Lot 6 Click Score: 16   End of Session Equipment Utilized During Treatment: Gait belt;Rolling walker (2 wheels) Nurse Communication: Mobility status;Other (comment) (Pt needs PureWick replaced as pt was wet in bed and linens were wet upon OT arrival)  Activity Tolerance: Patient tolerated treatment well;No increased pain Patient left: in chair;with call bell/phone within reach;with chair alarm set  OT Visit Diagnosis: Unsteadiness on feet (R26.81);Muscle weakness (generalized) (M62.81);Pain Pain - Right/Left: Right Pain - part of body: Knee                Time: [id number] OT Time Calculation (min): 31 min Charges:  OT General Charges $OT Visit: 1 Visit OT Evaluation $OT Eval Low Complexity: 1 Low OT Treatments $Self Care/Home Management : 8-22 mins  Suanne Minahan Beth Dixon, OTR/L 02/13/2023, 11:16 AM

## 2023-02-13 NOTE — Progress Notes (Signed)
Initial Nutrition Assessment  DOCUMENTATION CODES:   Not applicable  INTERVENTION:  Educate patient on adequate nutrition  Encourage adequate po intake  Educate patient about boost breeze ONS   NUTRITION DIAGNOSIS:   Inadequate oral intake related to constipation as evidenced by per patient/family report.   GOAL:   Patient will meet greater than or equal to 90% of their needs    MONITOR:   PO intake, Weight trends  REASON FOR ASSESSMENT:   Consult Assessment of nutrition requirement/status  ASSESSMENT:   82 y.o. female with PMHx of dementia, HTN, and afib presents with nausea, vomiting, and abdominal pain and admitted for hyponatremia.  Visited patient who was sitting in the chair at time of visit. She reports a good appetite and states she enjoyed her lunch very much. Patient reports she feels constipated. Patient reports nausea, vomiting and abdominal pain have resolved as she admits she has been passing flatus.   Patient reports a UBW of 208# which is 5# heavier than what she is right now. Patient likely lost this wieght from reduced po intake at home 2/2 GI upset.  Patient denies chew/swallowing issues. Boost breeze observed at bedside. Patient was on a clear liquid diet upon admission to [facility], but now is on a soft diet. Will monitor po intake.   Labs: Reviewed, Nutritional labs WDL  Meds: protonix, crestor  Wt: stable  PO intake: no po intake recorded at this time  I/O's: -157 ml      NUTRITION - FOCUSED PHYSICAL EXAM:  Flowsheet Row Most Recent Value  Orbital Region No depletion  Upper Arm Region No depletion  Thoracic and Lumbar Region No depletion  Buccal Region No depletion  Temple Region No depletion  Clavicle Bone Region No depletion  Clavicle and Acromion Bone Region No depletion  Scapular Bone Region Unable to assess  Dorsal Hand No depletion  Patellar Region Unable to assess  [mild pitting edema]  Anterior Thigh Region Unable to assess  [mild  pitting edema]  Posterior Calf Region Unable to assess  [mild pitting edema]  Edema (RD Assessment) None  Hair Reviewed  Eyes Reviewed  Mouth Reviewed  Skin Reviewed  Nails Reviewed       Diet Order:   Diet Order             DIET SOFT Room service appropriate? Yes; Fluid consistency: Thin  Diet effective now                   EDUCATION NEEDS:   Education needs have been addressed  Skin:  Skin Assessment: Reviewed RN Assessment  Last BM:  3/14  Height:   Ht Readings from Last 1 Encounters:  02/12/23 '5\' 6"'$  (1.676 m)    Weight:   Wt Readings from Last 1 Encounters:  02/12/23 92.5 kg    Ideal Body Weight:     BMI:  Body mass index is 32.91 kg/m.  Estimated Nutritional Needs:   Kcal:  1700-2000 kcal/day  Protein:  75-95 g protein/day  Fluid:  >/= 2L    Trey Paula, RDN, LDN  Clinical Nutrition

## 2023-02-14 DIAGNOSIS — E871 Hypo-osmolality and hyponatremia: Secondary | ICD-10-CM | POA: Diagnosis not present

## 2023-02-14 LAB — CBC
HCT: 35.6 % — ABNORMAL LOW (ref 36.0–46.0)
Hemoglobin: 11.8 g/dL — ABNORMAL LOW (ref 12.0–15.0)
MCH: 31.6 pg (ref 26.0–34.0)
MCHC: 33.1 g/dL (ref 30.0–36.0)
MCV: 95.4 fL (ref 80.0–100.0)
Platelets: 182 10*3/uL (ref 150–400)
RBC: 3.73 MIL/uL — ABNORMAL LOW (ref 3.87–5.11)
RDW: 12.5 % (ref 11.5–15.5)
WBC: 5.5 10*3/uL (ref 4.0–10.5)
nRBC: 0 % (ref 0.0–0.2)

## 2023-02-14 LAB — BASIC METABOLIC PANEL
Anion gap: 7 (ref 5–15)
BUN: 12 mg/dL (ref 8–23)
CO2: 25 mmol/L (ref 22–32)
Calcium: 8.7 mg/dL — ABNORMAL LOW (ref 8.9–10.3)
Chloride: 106 mmol/L (ref 98–111)
Creatinine, Ser: 0.51 mg/dL (ref 0.44–1.00)
GFR, Estimated: >60 mL/min (ref >60)
Glucose, Bld: 104 mg/dL — ABNORMAL HIGH (ref 70–99)
Potassium: 3.6 mmol/L (ref 3.5–5.1)
Sodium: 138 mmol/L (ref 135–145)

## 2023-02-14 MED ORDER — SENNOSIDES-DOCUSATE SODIUM 8.6-50 MG PO TABS: 1.0000 | ORAL_TABLET | Freq: Two times a day (BID) | ORAL | 0 refills | Status: AC

## 2023-02-14 MED ORDER — HALOPERIDOL LACTATE 5 MG/ML IJ SOLN: 0.5000 mg | Freq: Once | INTRAMUSCULAR | Status: DC

## 2023-02-14 MED ORDER — PANTOPRAZOLE SODIUM 40 MG PO TBEC: DELAYED_RELEASE_TABLET | ORAL | 0 refills | Status: AC

## 2023-02-14 NOTE — TOC Transition Note (Signed)
Transition of Care [facility]) - CM/SW Discharge Note   Patient Details  Name: Phyllis Knight MRN: [id number] Date of Birth: 1941/03/07  Transition of Care [facility]) CM/SW Contact:  Illene Regulus, LCSW Phone Number: 02/14/2023, 12:26 PM   Clinical Narrative:     CSW spoke with pt and her spouse Jenny Reichmann, about [facility] recommendations. Thye are agreeable to services. Pt and spouse has no preferences in agency. CSW sent referral to angie with [facility], she was able to accept pt for HHPT and OT services. information has been attached to pt's AVS. No additional TOC needs ,TOC sign off.   Final next level of care: Home w Home Health Services Barriers to Discharge: No Barriers Identified   Patient Goals and CMS Choice CMS Medicare.gov Compare Post Acute Care list provided to:: Patient Choice offered to / list presented to : Patient, [facility] POA / [place]  Discharge Placement                         Discharge Plan and Services Additional resources added to the After Visit Summary for                            [facility] Arranged: PT, OT HH Agency: [place] Date [place]: 02/14/23 Time [place]: 85 Representative spoke with at [place]: [place] Determinants of Health ([place]) Interventions SDOH Screenings   Food Insecurity: No Food Insecurity (02/12/2023)  Housing: Low Risk  (02/12/2023)  Transportation Needs: No Transportation Needs (02/12/2023)  Utilities: Not At Risk (02/12/2023)  Tobacco Use: Low Risk  (02/12/2023)     Readmission Risk Interventions     No data to display

## 2023-02-14 NOTE — Plan of Care (Signed)
  Problem: Education: Goal: Knowledge of General Education information will improve Description Including pain rating scale, medication(s)/side effects and non-pharmacologic comfort measures Outcome: Progressing   Problem: Health Behavior/Discharge Planning: Goal: Ability to manage health-related needs will improve Outcome: Progressing   

## 2023-02-14 NOTE — Discharge Summary (Signed)
[facility]  Physician Discharge Summary   Patient ID: Phyllis Knight MRN: [id number] DOB/AGE: July 01, 1941 82 y.o.  Admit date: 02/12/2023 Discharge date:   02/14/2023  PCP: Carol Ada, MD  DISCHARGE DIAGNOSES:     Hyponatremia   Essential hypertension   Paroxysmal A-fib ([place])   Mixed hyperlipidemia   Dementia without behavioral disturbance (HCC)   Nausea & vomiting, resolved   RECOMMENDATIONS FOR OUTPATIENT FOLLOW UP: Patient asked to follow up with PCP   Home Health:PT Equipment/Devices:None  CODE STATUS:Full   DISCHARGE CONDITION: fair  Diet recommendation: As before  INITIAL HISTORY: 82 y.o. female with medical history significant of dementia hypertension atrial fibrillation who presented with nausea vomiting and abdominal pain.  Has not had any bowel movement in several days.  However has also had poor oral intake.  Patient was noted to have hyponatremia.  She was hospitalized for further management.     HOSPITAL COURSE:   Nausea and vomiting Likely mild gastritis.  Seems to have resolved.  Slightly tender in the epigastric area.  Will place her on PPI. Tolerating diet. CT of the abdomen pelvis does not show any acute findings.  No bowel obstruction or constipation noted.   Hyponatremia Most likely due to hypovolemia. Came in with a sodium level of 123.  She was given IV fluids. Sodium level has improved.    Hypokalemia Repleted.   Paroxysmal atrial fibrillation Continue Eliquis and metoprolol.   Essential hypertension Monitor blood pressure closely.   Hyperlipidemia Continue with statin.   History of dementia Exhibiting some paranoia. Refusing to leave hospital. [place] working on this.   Obesity Estimated body mass index is 32.91 kg/m as calculated from the following:   Height as of this encounter: 5\' 6"  (1.676 m).   Weight as of this encounter: 92.5 kg.     Patient is stable for discharge home with  husband.   PERTINENT LABS:  The results of significant diagnostics from this hospitalization (including imaging, microbiology, ancillary and laboratory) are listed below for reference.    Microbiology: Recent Results (from the past 240 hour(s))  Resp panel by RT-PCR (RSV, Flu A&B, Covid) Anterior Nasal Swab     Status: None   Collection Time: 02/12/23  6:31 PM   Specimen: Anterior Nasal Swab  Result Value Ref Range Status   SARS Coronavirus 2 by RT PCR NEGATIVE NEGATIVE Final    Comment: (NOTE) SARS-CoV-2 target nucleic acids are NOT DETECTED.  The SARS-CoV-2 RNA is generally detectable in upper respiratory specimens during the acute phase of infection. The lowest concentration of SARS-CoV-2 viral copies this assay can detect is 138 copies/mL. A negative result does not preclude SARS-Cov-2 infection and should not be used as the sole basis for treatment or other patient management decisions. A negative result may occur with  improper specimen collection/handling, submission of specimen other than nasopharyngeal swab, presence of viral mutation(s) within the areas targeted by this assay, and inadequate number of viral copies(<138 copies/mL). A negative result must be combined with clinical observations, patient history, and epidemiological information. The expected result is Negative.  Fact Sheet for Patients:  EntrepreneurPulse.com.au  Fact Sheet for Healthcare Providers:  IncredibleEmployment.be  This test is no t yet approved or cleared by the [place] FDA and  has been authorized for detection and/or diagnosis of SARS-CoV-2 by FDA under an Emergency Use Authorization (EUA). This EUA will remain  in effect (meaning this test can be used) for the duration of the COVID-19 declaration under  Section 564(b)(1) of the Act, 21 U.S.C.section 360bbb-3(b)(1), unless the authorization is terminated  or revoked sooner.       Influenza A by  PCR NEGATIVE NEGATIVE Final   Influenza B by PCR NEGATIVE NEGATIVE Final    Comment: (NOTE) The Xpert Xpress SARS-CoV-2/FLU/RSV plus assay is intended as an aid in the diagnosis of influenza from Nasopharyngeal swab specimens and should not be used as a sole basis for treatment. Nasal washings and aspirates are unacceptable for Xpert Xpress SARS-CoV-2/FLU/RSV testing.  Fact Sheet for Patients: EntrepreneurPulse.com.au  Fact Sheet for Healthcare Providers: IncredibleEmployment.be  This test is not yet approved or cleared by the [place] FDA and has been authorized for detection and/or diagnosis of SARS-CoV-2 by FDA under an Emergency Use Authorization (EUA). This EUA will remain in effect (meaning this test can be used) for the duration of the COVID-19 declaration under Section 564(b)(1) of the Act, 21 U.S.C. section 360bbb-3(b)(1), unless the authorization is terminated or revoked.     Resp Syncytial Virus by PCR NEGATIVE NEGATIVE Final    Comment: (NOTE) Fact Sheet for Patients: EntrepreneurPulse.com.au  Fact Sheet for Healthcare Providers: IncredibleEmployment.be  This test is not yet approved or cleared by the [place] FDA and has been authorized for detection and/or diagnosis of SARS-CoV-2 by FDA under an Emergency Use Authorization (EUA). This EUA will remain in effect (meaning this test can be used) for the duration of the COVID-19 declaration under Section 564(b)(1) of the Act, 21 U.S.C. section 360bbb-3(b)(1), unless the authorization is terminated or revoked.  Performed at [facility], [place] [address] [place], [place] [address]      Labs:   Basic Metabolic Panel: Recent Labs  Lab 02/12/23 1342 02/12/23 1852 02/12/23 1923 02/13/23 0418 02/14/23 0441  NA 123* 130*  --  136 138  K 3.5 3.4*  --  3.9 3.6  CL 91* 96*  --  106 106  CO2 23 23  --  22 25   GLUCOSE 98 83  --  83 104*  BUN 8 7*  --  9 12  CREATININE 0.52 0.49  --  0.64 0.51  CALCIUM 8.3* 8.6*  --  8.7* 8.7*  MG  --   --  1.8 2.1  --   PHOS  --   --  3.5 4.3  --    Liver Function Tests: Recent Labs  Lab 02/12/23 1342 02/13/23 0418  AST 26 21  ALT 13 11  ALKPHOS 59 53  BILITOT 1.6* 1.0  PROT 7.6 6.5  ALBUMIN 4.0 3.5   Recent Labs  Lab 02/12/23 1342  LIPASE 43    CBC: Recent Labs  Lab 02/12/23 1342 02/13/23 0418 02/14/23 0441  WBC 5.6 3.9* 5.5  HGB 13.7 12.3 11.8*  HCT 39.3 35.8* 35.6*  MCV 91.0 92.3 95.4  PLT 188 194 182   Cardiac Enzymes: Recent Labs  Lab 02/12/23 1923 02/13/23 0418  CKTOTAL 539* 301*     IMAGING STUDIES DG Chest 1 View  Result Date: 02/12/2023 CLINICAL DATA:  Nausea and vomiting EXAM: CHEST  1 VIEW COMPARISON:  01/29/2023 FINDINGS: Stable cardiomediastinal silhouette. Aortic atherosclerotic calcification. Chronic bronchitic changes. No focal consolidation, pleural effusion, or pneumothorax. No acute osseous abnormality. IMPRESSION: No active disease. Electronically Signed   By: Placido Sou M.D.   On: 02/12/2023 19:01   CT ABDOMEN PELVIS W CONTRAST  Result Date: 02/12/2023 CLINICAL DATA:  Abdominal pain for 2 days, vomiting, constipation EXAM: CT ABDOMEN AND PELVIS WITH  CONTRAST TECHNIQUE: Multidetector CT imaging of the abdomen and pelvis was performed using the standard protocol following bolus administration of intravenous contrast. RADIATION DOSE REDUCTION: This exam was performed according to the departmental dose-optimization program which includes automated exposure control, adjustment of the mA and/or kV according to patient size and/or use of iterative reconstruction technique. CONTRAST:  174mL OMNIPAQUE IOHEXOL 300 MG/ML  SOLN COMPARISON:  05/20/2014 FINDINGS: Lower chest: No acute abnormality.  Coronary artery calcifications. Hepatobiliary: No focal liver abnormality is seen. Status post cholecystectomy. No biliary  dilatation. Pancreas: Unremarkable. No pancreatic ductal dilatation or surrounding inflammatory changes. Spleen: Normal in size without significant abnormality. Adrenals/Urinary Tract: Adrenal glands are unremarkable. Kidneys are normal, without renal calculi, solid lesion, or hydronephrosis. Bladder is unremarkable. Stomach/Bowel: Stomach is within normal limits. Appendix is not clearly visualized and may be surgically absent no evidence of bowel wall thickening, distention, or inflammatory changes. Sigmoid diverticula. Vascular/Lymphatic: Aortic atherosclerosis. No enlarged abdominal or pelvic lymph nodes. Reproductive: Status post hysterectomy. Other: Ventral hernia mesh repair. Musculoskeletal: No acute or significant osseous findings. IMPRESSION: 1. No acute CT findings of the abdomen or pelvis to explain abdominal pain. Specifically, no evidence of bowel obstruction or constipation. 2. Sigmoid diverticulosis without evidence of acute diverticulitis. 3. Status post cholecystectomy, hysterectomy, and ventral hernia mesh repair. 4. Coronary artery disease. Aortic Atherosclerosis (ICD10-I70.0). Electronically Signed   By: Delanna Ahmadi M.D.   On: 02/12/2023 17:04   DG Chest 2 View  Result Date: 01/29/2023 CLINICAL DATA:  Chest pain and weakness EXAM: CHEST - 2 VIEW COMPARISON:  01/29/2023 FINDINGS: Stable cardiomediastinal silhouette. Aortic atherosclerotic calcification. Chronic bronchitic changes. No focal consolidation, pleural effusion, or pneumothorax. No acute osseous abnormality. Electronic device over the left mid lung. IMPRESSION: No active cardiopulmonary disease. Electronically Signed   By: Placido Sou M.D.   On: 01/29/2023 01:00   DG Chest 2 View  Result Date: 01/19/2023 CLINICAL DATA:  Chest pain. EXAM: CHEST - 2 VIEW COMPARISON:  11/10/2020. FINDINGS: The heart size and mediastinal contours are within normal limits. There is atherosclerotic calcification of the aorta. Interstitial  prominence is noted at the lung bases and not significantly changed from the prior exam. No consolidation, effusion, or pneumothorax. Degenerative changes are present in the thoracic spine. IMPRESSION: No active cardiopulmonary disease. Electronically Signed   By: Brett Fairy M.D.   On: 01/19/2023 02:42    DISCHARGE EXAMINATION: Vitals:   02/13/23 2022 02/14/23 0437 02/14/23 1143 02/14/23 1246  BP: 113/67 124/78 (!) 108/94 (!) 140/85  Pulse: 74 66 72 68  Resp: 18 18  18   Temp: 98.4 F (36.9 C) 98.5 F (36.9 C)  98.8 F (37.1 C)  TempSrc: Oral Oral  Oral  SpO2: 99% 97%  98%  Weight:      Height:       General appearance: alert, distracted, and no distress Resp: clear to auscultation bilaterally Cardio: regular rate and rhythm, S1, S2 normal, no murmur, click, rub or gallop GI: soft, non-tender; bowel sounds normal; no masses,  no organomegaly  DISPOSITION: Home  Discharge Instructions     Call MD for:  difficulty breathing, headache or visual disturbances   Complete by: As directed    Call MD for:  extreme fatigue   Complete by: As directed    Call MD for:  persistant dizziness or light-headedness   Complete by: As directed    Call MD for:  persistant nausea and vomiting   Complete by: As directed    Call MD  for:  severe uncontrolled pain   Complete by: As directed    Call MD for:  temperature >100.4   Complete by: As directed    Diet general   Complete by: As directed    Discharge instructions   Complete by: As directed    Please take your medications as prescribed.  Seek attention if your symptoms worsen.  Be sure to follow-up with your primary care provider within 1 week.  You were cared for by a hospitalist during your hospital stay. If you have any questions about your discharge medications or the care you received while you were in the hospital after you are discharged, you can call the unit and asked to speak with the hospitalist on call if the hospitalist that  took care of you is not available. Once you are discharged, your primary care physician will handle any further medical issues. Please note that NO REFILLS for any discharge medications will be authorized once you are discharged, as it is imperative that you return to your primary care physician (or establish a relationship with a primary care physician if you do not have one) for your aftercare needs so that they can reassess your need for medications and monitor your lab values. If you do not have a primary care physician, you can call [phone] for a physician referral.   Increase activity slowly   Complete by: As directed         Allergies as of 02/14/2023       Reactions   Duloxetine Hcl Other (See Comments)   jumping legs   Cefuroxime Rash   Codeine Nausea And Vomiting   Zofran [ondansetron Hcl] Rash        Medication List     TAKE these medications    acetaminophen 325 MG tablet Commonly known as: TYLENOL Take 650 mg by mouth every 6 (six) hours as needed for mild pain.   amLODipine 10 MG tablet Commonly known as: NORVASC Take 1 tablet (10 mg total) by mouth daily.   cetirizine 10 MG tablet Commonly known as: ZYRTEC Take 10 mg by mouth daily as needed for rhinitis.   cholecalciferol 1000 units tablet Commonly known as: VITAMIN D Take 2,000 Units by mouth daily.   diltiazem 30 MG tablet Commonly known as: CARDIZEM TAKE 1 TABLET BY MOUTH 3  TIMES DAILY AS NEEDED What changed: reasons to take this   Eliquis 5 MG Tabs tablet Generic drug: apixaban TAKE 1 TABLET BY MOUTH TWICE  DAILY   furosemide 20 MG tablet Commonly known as: LASIX Take 1 tablet (20 mg total) by mouth daily. What changed: how much to take   metoprolol succinate 50 MG 24 hr tablet Commonly known as: TOPROL-XL TAKE 1 TABLET BY MOUTH  DAILY WITH OR IMMEDIATELY  FOLLOWING A MEAL What changed: See the new instructions.   pantoprazole 40 MG tablet Commonly known as: PROTONIX Take 1 tablet  twice daily for 1 week and then 1 tablet once daily What changed:  how much to take how to take this when to take this reasons to take this additional instructions   potassium chloride 8 MEQ tablet Commonly known as: KLOR-CON Take 8 mEq by mouth daily.   rosuvastatin 20 MG tablet Commonly known as: CRESTOR TAKE 1 TABLET BY MOUTH DAILY   senna-docusate 8.6-50 MG tablet Commonly known as: Senokot-S Take 1 tablet by mouth 2 (two) times daily.          Follow-up Information  Carol Ada, MD. Schedule an appointment as soon as possible for a visit in 1 week(s).   Specialty: Family Medicine Why: post hospitalization follow up Contact information: [place] [address] [place], [place] Follow up.   Specialty: [place] Why: [place]) will follow up with you 24 to 48 hrs after discharge. Contact information: [place] [address] [phone]                 TOTAL DISCHARGE TIME: 35 mins  [facility] Pager on [email or web address]  02/14/2023, 4:51 PM

## 2023-03-24 ENCOUNTER — Other Ambulatory Visit: Payer: Medicare HMO

## 2023-04-09 ENCOUNTER — Telehealth: Payer: Self-pay | Admitting: Cardiology

## 2023-04-09 DIAGNOSIS — I480 Paroxysmal atrial fibrillation: Secondary | ICD-10-CM | POA: Diagnosis not present

## 2023-04-09 NOTE — Telephone Encounter (Signed)
Patient requested call back this morning.  She keeps saying that her heart stopped beating and she drank some water and started working again.  I obtained collateral history from her husband who was next to her.  There was no known loss pf pulse, loss of consciousness or CPR performed.  Husband does not think her heart stop beating.  Patient is complaining of severe pain on her left side and unable to move.  She appears very confused.  I do not think there is any benefit in an outpatient cardiac visit at this time, as her complaints seem to be noncardiac.  I am concerned that she is confused and somewhat disoriented.  I encouraged her husband to call 911 to go to emergency room for complete evaluation.  If there is any cardiac issue, emergency room providers will contact me.  Time spent: 10 min

## 2023-04-14 ENCOUNTER — Ambulatory Visit: Payer: Medicare HMO | Admitting: Cardiology

## 2023-04-14 NOTE — Progress Notes (Deleted)
Follow up visit  Subjective:   Phyllis Knight, female    DOB: 07/19/1941, 82 y.o.   MRN: [id number]    HPI   No chief complaint on file.   82 y.o. African American female  with hypertension, fibromyalgia, paroxysmal Afib  ***   Current Outpatient Medications:    acetaminophen (TYLENOL) 325 MG tablet, Take 650 mg by mouth every 6 (six) hours as needed for mild pain., Disp: , Rfl:    amLODipine (NORVASC) 10 MG tablet, Take 1 tablet (10 mg total) by mouth daily., Disp: 90 tablet, Rfl: 3   cetirizine (ZYRTEC) 10 MG tablet, Take 10 mg by mouth daily as needed for rhinitis., Disp: , Rfl:    cholecalciferol (VITAMIN D) 1000 units tablet, Take 2,000 Units by mouth daily. , Disp: , Rfl:    diltiazem (CARDIZEM) 30 MG tablet, TAKE 1 TABLET BY MOUTH 3  TIMES DAILY AS NEEDED (Patient taking differently: Take 30 mg by mouth 3 (three) times daily as needed (increased heart rate).), Disp: 270 tablet, Rfl: 3   ELIQUIS 5 MG TABS tablet, TAKE 1 TABLET BY MOUTH TWICE  DAILY, Disp: 180 tablet, Rfl: 3   furosemide (LASIX) 20 MG tablet, Take 1 tablet (20 mg total) by mouth daily. (Patient taking differently: Take 10 mg by mouth daily.), Disp: 90 tablet, Rfl: 2   metoprolol succinate (TOPROL-XL) 50 MG 24 hr tablet, TAKE 1 TABLET BY MOUTH  DAILY WITH OR IMMEDIATELY  FOLLOWING A MEAL (Patient taking differently: Take 50 mg by mouth daily.), Disp: 30 tablet, Rfl: 6   pantoprazole (PROTONIX) 40 MG tablet, Take 1 tablet twice daily for 1 week and then 1 tablet once daily, Disp: 60 tablet, Rfl: 0   potassium chloride (KLOR-CON) 8 MEQ tablet, Take 8 mEq by mouth daily. , Disp: , Rfl:    rosuvastatin (CRESTOR) 20 MG tablet, TAKE 1 TABLET BY MOUTH DAILY, Disp: 100 tablet, Rfl: 2   senna-docusate (SENOKOT-S) 8.6-50 MG tablet, Take 1 tablet by mouth 2 (two) times daily., Disp: 60 tablet, Rfl: 0  Cardiovascular & other pertient studies:  EKG 11/20/2023:  Sinus rhythm 58 bpm w/sinus arrhtymia LVH  Mobile  cardiac telemetry 7 days 03/21/2021 - 03/29/2021: Dominant rhythm: Sinus. HR 43-90 bpm. Avg HR 61 bpm, in sinus rhythm. 282 episodes of SVT, fastest at 164 bpm for 6 beats, longest for 17.4 secs at 112 bpm. <1% isolated SVE, couplet/triplets. <1% isolated VE, couplets. No atrial fibrillation/atrial flutter/VT/high grade AV block, sinus pause >3sec noted. 0 patient triggered events.   Lexiscan Myoview stress test 06/07/2019: Lexiscan stress test was performed. Stress EKG is non-diagnostic, as this is pharmacological stress test. In addition, Rest and stress EKG reveal sinus rhythm, frequent PVC's and occasional PAC's.  SPECT images revealed small sized, mildly reversible, mild intensity perfusion defect in basal inferior myocardium. While breast attenuation is possible (imaging performed in sitting position), small area of ischemia cannot be excluded. LVEF 73% with normal wall motion. Low risk study.   Event monitor 04/28/2019 - 05/11/2019:  1 auto triggered event shows sinus rhythm. No patient triggered events seen. No Afib.    Echocardiogram 02/16/2019:  1. The left ventricle has normal systolic function with an ejection fraction of 60-65%. The cavity size was normal. There is mild concentric left ventricular hypertrophy. Diastolic function could not be assessed due to atrial fibrillation.  2. Low normal RV systolic function.  3. Mild left atrial dilatation. Moderate right atrial dilatation.  4. Mild to moderate mitral and  tricuspid regurgitation. RVSP 29 mmHg.  5. Vavlular regurgitation and chamber dilatation new since previous echocardiogram on 07/17/2018.   Recent labs: 01/19/2023: Glucose 97, BUN/Cr 11/0.72. EGFR >60. Na/K 137/3.7. Rest of the CMP normal Trop HS 3, 4 H/H 13/39. MCV 94. Platelets 197  05/09/2022: Glucose 83, BUN/Cr 11/0.6. EGFR >60. Na/K 140/3.7. Rest of the CMP normal H/H 14/41. MCV 95. Platelets 215  11/07/2021: Glucose 80, BUN/Cr 10/0.53. EGFR 93. Na/K 140/4.2.  Rest of the CMP normal H/H 13/39. Chol 152, TG 83, HDL 50, LDL 86 TSH 1.4   Review of Systems  Cardiovascular:  Negative for chest pain, dyspnea on exertion, leg swelling, palpitations and syncope.  Musculoskeletal:  Positive for joint pain.          There were no vitals filed for this visit.      There is no height or weight on file to calculate BMI. There were no vitals filed for this visit.    Objective:  Physical Exam Vitals and nursing note reviewed.  Constitutional:      General: She is not in acute distress. Neck:     Vascular: No JVD.  Cardiovascular:     Rate and Rhythm: Normal rate and regular rhythm.     Heart sounds: Normal heart sounds. No murmur heard. Pulmonary:     Effort: Pulmonary effort is normal.     Breath sounds: Normal breath sounds. No wheezing or rales.         Assessment & Recommendations:   82 y.o. African American female  with hypertension, fibromyalgia, paroxysmal Afib, PSVT, PVC  Hypertension: Controlled.  Paroxysmal Afib:  CHA2DS2VASc score 4. Annual stroke risk 5%. Continue Eliquis 5 mg twice daily.  Episode of palpitations leading to ER visit in 01/2023. Recommend 2-week cardiac telemetry.  Hyperlipidemia: Currently on Crestor 20 mg daily. Check lipid panel.  F/u in 6 months   [employer], PA-C 04/14/2023, 8:52 AM Office: [phone]

## 2023-05-10 DIAGNOSIS — M797 Fibromyalgia: Secondary | ICD-10-CM | POA: Diagnosis not present

## 2023-05-10 DIAGNOSIS — R2689 Other abnormalities of gait and mobility: Secondary | ICD-10-CM | POA: Diagnosis not present

## 2023-05-10 DIAGNOSIS — M129 Arthropathy, unspecified: Secondary | ICD-10-CM | POA: Diagnosis not present

## 2023-05-16 ENCOUNTER — Telehealth: Payer: Self-pay

## 2023-05-16 NOTE — Telephone Encounter (Signed)
2-3 L  Thanks MJP

## 2023-05-16 NOTE — Telephone Encounter (Signed)
I was talking to patient and got disconnected. I called her back to let her know how much water to drink and she said she didn't need me to call anymore that she knows how much water to drink.

## 2023-05-16 NOTE — Telephone Encounter (Signed)
HOW MUCH WATER SHOULD SHE BE DRINKING?

## 2023-06-19 DIAGNOSIS — I480 Paroxysmal atrial fibrillation: Secondary | ICD-10-CM | POA: Diagnosis not present

## 2023-06-19 DIAGNOSIS — E7800 Pure hypercholesterolemia, unspecified: Secondary | ICD-10-CM | POA: Diagnosis not present

## 2023-06-19 DIAGNOSIS — R609 Edema, unspecified: Secondary | ICD-10-CM | POA: Diagnosis not present

## 2023-06-19 DIAGNOSIS — K219 Gastro-esophageal reflux disease without esophagitis: Secondary | ICD-10-CM | POA: Diagnosis not present

## 2023-06-19 DIAGNOSIS — I700 Atherosclerosis of aorta: Secondary | ICD-10-CM | POA: Diagnosis not present

## 2023-06-19 DIAGNOSIS — N898 Other specified noninflammatory disorders of vagina: Secondary | ICD-10-CM | POA: Diagnosis not present

## 2023-06-19 DIAGNOSIS — F22 Delusional disorders: Secondary | ICD-10-CM | POA: Diagnosis not present

## 2023-06-19 DIAGNOSIS — I10 Essential (primary) hypertension: Secondary | ICD-10-CM | POA: Diagnosis not present

## 2023-06-19 DIAGNOSIS — M797 Fibromyalgia: Secondary | ICD-10-CM | POA: Diagnosis not present

## 2023-06-19 DIAGNOSIS — F0392 Unspecified dementia, unspecified severity, with psychotic disturbance: Secondary | ICD-10-CM | POA: Diagnosis not present

## 2023-08-08 ENCOUNTER — Ambulatory Visit: Payer: Medicare HMO | Admitting: Cardiology

## 2023-08-08 NOTE — Progress Notes (Deleted)
Follow up visit  Subjective:   Phyllis Knight, female    DOB: 12/04/1940, 82 y.o.   MRN: [id number]    HPI   No chief complaint on file.   82 y.o. African American female  with hypertension, fibromyalgia, paroxysmal Afib  Patient was recently seen in [facility], ER with complaints of rapid heartbeat.  She was found to be in sinus rhythm while in the ER.  She reported some chest pain with palpitations, but ACS was excluded with normal troponins.   Current Outpatient Medications:  .  acetaminophen (TYLENOL) 325 MG tablet, Take 650 mg by mouth every 6 (six) hours as needed for mild pain., Disp: , Rfl:  .  amLODipine (NORVASC) 10 MG tablet, Take 1 tablet (10 mg total) by mouth daily., Disp: 90 tablet, Rfl: 3 .  cetirizine (ZYRTEC) 10 MG tablet, Take 10 mg by mouth daily as needed for rhinitis., Disp: , Rfl:  .  cholecalciferol (VITAMIN D) 1000 units tablet, Take 2,000 Units by mouth daily. , Disp: , Rfl:  .  diltiazem (CARDIZEM) 30 MG tablet, TAKE 1 TABLET BY MOUTH 3  TIMES DAILY AS NEEDED (Patient taking differently: Take 30 mg by mouth 3 (three) times daily as needed (increased heart rate).), Disp: 270 tablet, Rfl: 3 .  ELIQUIS 5 MG TABS tablet, TAKE 1 TABLET BY MOUTH TWICE  DAILY, Disp: 180 tablet, Rfl: 3 .  furosemide (LASIX) 20 MG tablet, Take 1 tablet (20 mg total) by mouth daily. (Patient taking differently: Take 10 mg by mouth daily.), Disp: 90 tablet, Rfl: 2 .  metoprolol succinate (TOPROL-XL) 50 MG 24 hr tablet, TAKE 1 TABLET BY MOUTH  DAILY WITH OR IMMEDIATELY  FOLLOWING A MEAL (Patient taking differently: Take 50 mg by mouth daily.), Disp: 30 tablet, Rfl: 6 .  pantoprazole (PROTONIX) 40 MG tablet, Take 1 tablet twice daily for 1 week and then 1 tablet once daily, Disp: 60 tablet, Rfl: 0 .  potassium chloride (KLOR-CON) 8 MEQ tablet, Take 8 mEq by mouth daily. , Disp: , Rfl:  .  rosuvastatin (CRESTOR) 20 MG tablet, TAKE 1 TABLET BY MOUTH DAILY, Disp: 100 tablet, Rfl: 2 .   senna-docusate (SENOKOT-S) 8.6-50 MG tablet, Take 1 tablet by mouth 2 (two) times daily., Disp: 60 tablet, Rfl: 0  Cardiovascular & other pertient studies:  EKG 11/20/2023:  Sinus rhythm 58 bpm w/sinus arrhtymia LVH  Mobile cardiac telemetry 7 days 03/21/2021 - 03/29/2021: Dominant rhythm: Sinus. HR 43-90 bpm. Avg HR 61 bpm, in sinus rhythm. 282 episodes of SVT, fastest at 164 bpm for 6 beats, longest for 17.4 secs at 112 bpm. <1% isolated SVE, couplet/triplets. <1% isolated VE, couplets. No atrial fibrillation/atrial flutter/VT/high grade AV block, sinus pause >3sec noted. 0 patient triggered events.   Lexiscan Myoview stress test 06/07/2019: Lexiscan stress test was performed. Stress EKG is non-diagnostic, as this is pharmacological stress test. In addition, Rest and stress EKG reveal sinus rhythm, frequent PVC's and occasional PAC's.  SPECT images revealed small sized, mildly reversible, mild intensity perfusion defect in basal inferior myocardium. While breast attenuation is possible (imaging performed in sitting position), small area of ischemia cannot be excluded. LVEF 73% with normal wall motion. Low risk study.   Event monitor 04/28/2019 - 05/11/2019:  1 auto triggered event shows sinus rhythm. No patient triggered events seen. No Afib.    Echocardiogram 02/16/2019:  1. The left ventricle has normal systolic function with an ejection fraction of 60-65%. The cavity size was normal. There  is mild concentric left ventricular hypertrophy. Diastolic function could not be assessed due to atrial fibrillation.  2. Low normal RV systolic function.  3. Mild left atrial dilatation. Moderate right atrial dilatation.  4. Mild to moderate mitral and tricuspid regurgitation. RVSP 29 mmHg.  5. Vavlular regurgitation and chamber dilatation new since previous echocardiogram on 07/17/2018.   Recent labs: 01/19/2023: Glucose 97, BUN/Cr 11/0.72. EGFR >60. Na/K 137/3.7. Rest of the CMP  normal Trop HS 3, 4 H/H 13/39. MCV 94. Platelets 197  05/09/2022: Glucose 83, BUN/Cr 11/0.6. EGFR >60. Na/K 140/3.7. Rest of the CMP normal H/H 14/41. MCV 95. Platelets 215  11/07/2021: Glucose 80, BUN/Cr 10/0.53. EGFR 93. Na/K 140/4.2. Rest of the CMP normal H/H 13/39. Chol 152, TG 83, HDL 50, LDL 86 TSH 1.4   Review of Systems  Cardiovascular:  Negative for chest pain, dyspnea on exertion, leg swelling, palpitations and syncope.  Musculoskeletal:  Positive for joint pain.          There were no vitals filed for this visit.      There is no height or weight on file to calculate BMI. There were no vitals filed for this visit.    Objective:  Physical Exam Vitals and nursing note reviewed.  Constitutional:      General: She is not in acute distress. Neck:     Vascular: No JVD.  Cardiovascular:     Rate and Rhythm: Normal rate and regular rhythm.     Heart sounds: Normal heart sounds. No murmur heard. Pulmonary:     Effort: Pulmonary effort is normal.     Breath sounds: Normal breath sounds. No wheezing or rales.        Assessment & Recommendations:   82 y.o. African American female  with hypertension, fibromyalgia, paroxysmal Afib, PSVT, PVC  Hypertension: Controlled.  Paroxysmal Afib:  CHA2DS2VASc score 4. Annual stroke risk 5%. Continue Eliquis 5 mg twice daily.  Episode of palpitations leading to ER visit in 01/2023. Recommend 2-week cardiac telemetry.  Hyperlipidemia: Currently on Crestor 20 mg daily. Check lipid panel.  F/u in 6 months   [employer], PA-C 08/08/2023, 11:49 AM Office: [phone]

## 2023-09-15 DIAGNOSIS — F419 Anxiety disorder, unspecified: Secondary | ICD-10-CM | POA: Diagnosis not present

## 2023-09-15 DIAGNOSIS — I4891 Unspecified atrial fibrillation: Secondary | ICD-10-CM | POA: Diagnosis not present

## 2023-09-15 DIAGNOSIS — R413 Other amnesia: Secondary | ICD-10-CM | POA: Diagnosis not present

## 2023-09-15 DIAGNOSIS — R109 Unspecified abdominal pain: Secondary | ICD-10-CM | POA: Diagnosis not present

## 2023-10-23 DIAGNOSIS — L304 Erythema intertrigo: Secondary | ICD-10-CM | POA: Diagnosis not present

## 2023-10-23 DIAGNOSIS — H9202 Otalgia, left ear: Secondary | ICD-10-CM | POA: Diagnosis not present

## 2023-11-11 ENCOUNTER — Telehealth: Payer: Self-pay | Admitting: Cardiology

## 2023-11-11 NOTE — Telephone Encounter (Signed)
Returned a call back to the pt.   Pt states she had a very restless night last night, due to complaints of tachycardia.   She states this only occurred in the night and now her heart rate is feeling normal again.  She states she is very fatigued from the episodes last night, due to not sleeping.  She states she also feels extremely weak.   She voiced no complaints of chest pain, sob, doe, orthopnea, dizziness, pre-syncopal or syncopal episodes at this time.   She states currently she is feeling better with her rates, but still very weak.   Pt sounded very weak on the phone, so I advised her to go into the ER for further evaluation, and being she was very difficult to assess and triage over the phone.  Pt refused this option and stated she will go to the ER if her symptoms worsen.   Was able to make her an appt to see Jari Favre PA-C for this Friday 12/13 at 1:55 pm.  Pt refused the appt at first, but her husband came on the phone and said he will have her here at that time, and will call us if she is unable to attend the appointment.  Pts husband confirmed with me that he will take her to the ER, if symptoms return/worsen/persist between now and her appt on Friday with Tessa.   Informed both the pt and husband that I will make Dr. Rosemary Holms aware of this plan.

## 2023-11-11 NOTE — Telephone Encounter (Signed)
Calling to speak to the nurse, states that her heart was beating real fast this morning. Currently not as much. Please advise

## 2023-11-11 NOTE — Telephone Encounter (Signed)
Patient has had multiple similar calls in the past, but has not made to ER or our office most of those occasions. In the past, I have felt that several of her complaints were non cardiac and suspected underlying cognitive issues.  Thanks MJP

## 2023-11-14 ENCOUNTER — Ambulatory Visit: Payer: Medicare HMO | Attending: Physician Assistant | Admitting: Physician Assistant

## 2023-11-14 NOTE — Progress Notes (Unsigned)
  Cardiology Office Note:  .   Date:  11/14/2023  ID:  Phyllis Knight, DOB 1941-10-23, MRN [id number] PCP: Merri Brunette, MD  [facility] Providers Cardiologist:  None { Click to update primary MD,subspecialty MD or APP then REFRESH:1}   History of Present Illness: Marland Kitchen   Phyllis Knight is a 82 y.o. female with hypertension, fibromyalgia, paroxysmal atrial fibrillation who was last seen in February.  [facility], ER visit prior to her last visit with complaint of rapid heartbeat.  Found to be in sinus rhythm while in the ER.  Some chest pains palpitations but ACS was excluded with normal troponins.  Today, she ***  ROS: ***  Studies Reviewed: Marland Kitchen        Mobile cardiac telemetry 7 days 03/21/2021 - 03/29/2021: Dominant rhythm: Sinus. HR 43-90 bpm. Avg HR 61 bpm, in sinus rhythm. 282 episodes of SVT, fastest at 164 bpm for 6 beats, longest for 17.4 secs at 112 bpm. <1% isolated SVE, couplet/triplets. <1% isolated VE, couplets. No atrial fibrillation/atrial flutter/VT/high grade AV block, sinus pause >3sec noted. 0 patient triggered events.    Lexiscan Myoview stress test 06/07/2019: Lexiscan stress test was performed. Stress EKG is non-diagnostic, as this is pharmacological stress test. In addition, Rest and stress EKG reveal sinus rhythm, frequent PVC's and occasional PAC's.  SPECT images revealed small sized, mildly reversible, mild intensity perfusion defect in basal inferior myocardium. While breast attenuation is possible (imaging performed in sitting position), small area of ischemia cannot be excluded. LVEF 73% with normal wall motion. Low risk study.    Event monitor 04/28/2019 - 05/11/2019:  1 auto triggered event shows sinus rhythm. No patient triggered events seen. No Afib.     Echocardiogram 02/16/2019:  1. The left ventricle has normal systolic function with an ejection fraction of 60-65%. The cavity size was normal. There is mild concentric left ventricular  hypertrophy. Diastolic function could not be assessed due to atrial fibrillation.  2. Low normal RV systolic function.  3. Mild left atrial dilatation. Moderate right atrial dilatation.  4. Mild to moderate mitral and tricuspid regurgitation. RVSP 29 mmHg.  5. Vavlular regurgitation and chamber dilatation new since previous echocardiogram on 07/17/2018.   Risk Assessment/Calculations:   {Does this patient have ATRIAL FIBRILLATION?:[phone]} No BP recorded.  {Refresh Note OR Click here to enter BP  :1}***       Physical Exam:   VS:  There were no vitals taken for this visit.   Wt Readings from Last 3 Encounters:  02/12/23 203 lb 14.8 oz (92.5 kg)  01/29/23 207 lb 3.7 oz (94 kg)  01/24/23 208 lb (94.3 kg)    GEN: Well nourished, well developed in no acute distress NECK: No JVD; No carotid bruits CARDIAC: ***RRR, no murmurs, rubs, gallops RESPIRATORY:  Clear to auscultation without rales, wheezing or rhonchi  ABDOMEN: Soft, non-tender, non-distended EXTREMITIES:  No edema; No deformity   ASSESSMENT AND PLAN: .   Hypertension Paroxysmal atrial fibrillation Hyperlipidemia    {Are you ordering a CV Procedure (e.g. stress test, cath, DCCV, TEE, etc)?   Press F2        :[id number]}  Dispo: ***  Signed, Sharlene Dory, PA-C

## 2023-11-21 DIAGNOSIS — J209 Acute bronchitis, unspecified: Secondary | ICD-10-CM | POA: Diagnosis not present

## 2024-06-05 ENCOUNTER — Encounter (HOSPITAL_COMMUNITY): Payer: Self-pay

## 2024-06-05 ENCOUNTER — Emergency Department (HOSPITAL_COMMUNITY)
Admission: EM | Admit: 2024-06-05 | Discharge: 2024-06-06 | Disposition: A | Discharge status: Home / Self Care | Source: Intra-hospital | Attending: Emergency Medicine | Admitting: Emergency Medicine

## 2024-06-05 ENCOUNTER — Other Ambulatory Visit: Payer: Self-pay

## 2024-06-05 ENCOUNTER — Emergency Department (HOSPITAL_COMMUNITY)

## 2024-06-05 DIAGNOSIS — Z79899 Other long term (current) drug therapy: Secondary | ICD-10-CM | POA: Insufficient documentation

## 2024-06-05 DIAGNOSIS — F0390 Unspecified dementia without behavioral disturbance: Secondary | ICD-10-CM | POA: Diagnosis not present

## 2024-06-05 DIAGNOSIS — S0231XA Fracture of orbital floor, right side, initial encounter for closed fracture: Secondary | ICD-10-CM | POA: Diagnosis not present

## 2024-06-05 DIAGNOSIS — W1830XA Fall on same level, unspecified, initial encounter: Secondary | ICD-10-CM | POA: Diagnosis not present

## 2024-06-05 DIAGNOSIS — S0285XA Fracture of orbit, unspecified, initial encounter for closed fracture: Secondary | ICD-10-CM

## 2024-06-05 DIAGNOSIS — Z7901 Long term (current) use of anticoagulants: Secondary | ICD-10-CM | POA: Diagnosis not present

## 2024-06-05 DIAGNOSIS — W19XXXA Unspecified fall, initial encounter: Secondary | ICD-10-CM

## 2024-06-05 DIAGNOSIS — S0591XA Unspecified injury of right eye and orbit, initial encounter: Secondary | ICD-10-CM | POA: Diagnosis present

## 2024-06-05 LAB — I-STAT CHEM 8, ED
BUN: 14 mg/dL (ref 8–23)
Calcium, Ion: 1.1 mmol/L — ABNORMAL LOW (ref 1.15–1.40)
Chloride: 108 mmol/L (ref 98–111)
Creatinine, Ser: 0.8 mg/dL (ref 0.44–1.00)
Glucose, Bld: 125 mg/dL — ABNORMAL HIGH (ref 70–99)
HCT: 42 % (ref 36.0–46.0)
Hemoglobin: 14.3 g/dL (ref 12.0–15.0)
Potassium: 4.1 mmol/L (ref 3.5–5.1)
Sodium: 143 mmol/L (ref 135–145)
TCO2: 22 mmol/L (ref 22–32)

## 2024-06-05 LAB — COMPREHENSIVE METABOLIC PANEL WITH GFR
ALT: 12 U/L (ref 0–44)
AST: 26 U/L (ref 15–41)
Albumin: 3.3 g/dL — ABNORMAL LOW (ref 3.5–5.0)
Alkaline Phosphatase: 64 U/L (ref 38–126)
Anion gap: 13 (ref 5–15)
BUN: 14 mg/dL (ref 8–23)
CO2: 23 mmol/L (ref 22–32)
Calcium: 9.1 mg/dL (ref 8.9–10.3)
Chloride: 105 mmol/L (ref 98–111)
Creatinine, Ser: 0.89 mg/dL (ref 0.44–1.00)
GFR, Estimated: >60 mL/min
Glucose, Bld: 126 mg/dL — ABNORMAL HIGH (ref 70–99)
Potassium: 4 mmol/L (ref 3.5–5.1)
Sodium: 141 mmol/L (ref 135–145)
Total Bilirubin: 1.7 mg/dL — ABNORMAL HIGH (ref 0.0–1.2)
Total Protein: 7.9 g/dL (ref 6.5–8.1)

## 2024-06-05 LAB — SAMPLE TO BLOOD BANK

## 2024-06-05 LAB — CBC
HCT: 39.9 % (ref 36.0–46.0)
Hemoglobin: 12.4 g/dL (ref 12.0–15.0)
MCH: 31.2 pg (ref 26.0–34.0)
MCHC: 31.1 g/dL (ref 30.0–36.0)
MCV: 100.3 fL — ABNORMAL HIGH (ref 80.0–100.0)
Platelets: 162 K/uL (ref 150–400)
RBC: 3.98 MIL/uL (ref 3.87–5.11)
RDW: 14.6 % (ref 11.5–15.5)
WBC: 3.7 K/uL — ABNORMAL LOW (ref 4.0–10.5)
nRBC: 0 % (ref 0.0–0.2)

## 2024-06-05 LAB — ETHANOL: Alcohol, Ethyl (B): <15 mg/dL (ref <15)

## 2024-06-05 LAB — I-STAT CG4 LACTIC ACID, ED: Lactic Acid, Venous: 2.7 mmol/L (ref 0.5–1.9)

## 2024-06-05 LAB — PROTIME-INR
INR: 1.6 — ABNORMAL HIGH (ref 0.8–1.2)
Prothrombin Time: 19.7 s — ABNORMAL HIGH (ref 11.4–15.2)

## 2024-06-05 MED ORDER — TETRACAINE HCL 0.5 % OP SOLN
2.0000 [drp] | Freq: Once | OPHTHALMIC | Status: AC
  Administered 2024-06-05: 2 [drp] via OPHTHALMIC
  Filled 2024-06-05: qty 4

## 2024-06-05 MED ORDER — TRAMADOL HCL 50 MG PO TABS
50.0000 mg | ORAL_TABLET | Freq: Once | ORAL | Status: AC
  Administered 2024-06-05: 50 mg via ORAL
  Filled 2024-06-05: qty 1

## 2024-06-05 MED ORDER — ACETAMINOPHEN 500 MG PO TABS
1000.0000 mg | ORAL_TABLET | Freq: Once | ORAL | Status: AC
  Administered 2024-06-05: 1000 mg via ORAL
  Filled 2024-06-05: qty 2

## 2024-06-05 MED ORDER — FLUORESCEIN SODIUM 1 MG OP STRP
1.0000 | ORAL_STRIP | Freq: Once | OPHTHALMIC | Status: AC
  Administered 2024-06-05: 1 via OPHTHALMIC
  Filled 2024-06-05: qty 1

## 2024-06-05 MED ORDER — TRAMADOL HCL 50 MG PO TABS: ORAL_TABLET | ORAL | 0 refills | Status: DC
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
## 2024-06-05 NOTE — ED Provider Notes (Signed)
 [place] [facility] AT [facility] Provider Note   CSN: [id number] Arrival date & time: 06/05/24  1750     Patient presents with: Phyllis Knight is a 83 y.o. female.   HPI Patient had a mechanical fall in her home.  Patient takes Eliquis .  Patient has dementia.  She does live with her husband, and her son is present as well.  But she was home alone for a few hours today.  They report that she probably cannot remember too well exactly what happened.  The patient reports losing her balance and falling and hitting her eye on the wall when she fell.  Patient's husband and son estimate that she was alone for about 3 hours at home when they found her.  Patient was awake and alert at her baseline and they found her.  She has maintained her baseline mental status.  She does not have other specific complaints.    Prior to Admission medications   Medication Sig Start Date End Date Taking? Authorizing Provider  acetaminophen  (TYLENOL ) 325 MG tablet Take 650 mg by mouth every 6 (six) hours as needed for mild pain.   Yes [provider]  amLODipine  (NORVASC ) 10 MG tablet Take 1 tablet (10 mg total) by mouth daily. 08/21/22  Yes Patwardhan, Manish J, MD  cetirizine (ZYRTEC) 10 MG tablet Take 10 mg by mouth daily as needed for rhinitis.   Yes [provider]  cholecalciferol  (VITAMIN D ) 1000 units tablet Take 2,000 Units by mouth daily.    Yes [provider]  ELIQUIS  5 MG TABS tablet TAKE 1 TABLET BY MOUTH TWICE  DAILY 11/12/22  Yes Patwardhan, Manish J, MD  furosemide  (LASIX ) 20 MG tablet Take 1 tablet (20 mg total) by mouth daily. Patient taking differently: Take 10 mg by mouth daily. 06/20/20  Yes Patwardhan, Manish J, MD  metoprolol  succinate (TOPROL -XL) 50 MG 24 hr tablet TAKE 1 TABLET BY MOUTH  DAILY WITH OR IMMEDIATELY  FOLLOWING A MEAL Patient taking differently: Take 50 mg by mouth daily. 04/18/20  Yes Patwardhan, Manish J, MD   pantoprazole  (PROTONIX ) 40 MG tablet Take 1 tablet twice daily for 1 week and then 1 tablet once daily Patient taking differently: Take 40 mg by mouth daily as needed (for heartburn). 02/14/23  Yes Verdene Purchase, MD  potassium chloride  (KLOR-CON ) 8 MEQ tablet Take 8 mEq by mouth daily.  04/17/15  Yes [provider]  QUEtiapine  (SEROQUEL ) 50 MG tablet Take 50 mg by mouth at bedtime. 04/30/24  Yes [provider]  rosuvastatin  (CRESTOR ) 20 MG tablet TAKE 1 TABLET BY MOUTH DAILY 08/19/22  Yes Patwardhan, Manish J, MD  senna-docusate (SENOKOT-S) 8.6-50 MG tablet Take 1 tablet by mouth 2 (two) times daily. Patient taking differently: Take 1 tablet by mouth daily as needed for mild constipation. 02/14/23  Yes Krishnan, Gokul, MD  traMADol  (ULTRAM ) 50 MG tablet 1 to 2 tablets every 6 hours as needed with extra strength Tylenol . 06/05/24  Yes Kasra Melvin, Ludivina, MD  diltiazem  (CARDIZEM ) 30 MG tablet TAKE 1 TABLET BY MOUTH 3  TIMES DAILY AS NEEDED Patient taking differently: Take 30 mg by mouth 3 (three) times daily as needed (increased heart rate). 11/13/21   Patwardhan, Newman PARAS, MD    Allergies: Duloxetine hcl, Cefuroxime, Codeine, and Zofran  [ondansetron  hcl]    Review of Systems  Updated Vital Signs BP (!) 162/100 (BP Location: Right Arm)   Pulse (!) 105   Temp (!)  97.4 F (36.3 C) (Oral)   Resp 18   Ht 5' 9 (1.753 m)   Wt 72.6 kg   SpO2 98%   BMI 23.63 kg/m   Physical Exam Constitutional:      Comments: Patient is alert.  She is answering questions.  No respiratory distress.  HENT:     Head:     Comments: Large periorbital hematoma on the right.  No other facial or head trauma identified.    Nose: Nose normal.     Mouth/Throat:     Mouth: Mucous membranes are moist.     Pharynx: Oropharynx is clear.  Eyes:     Comments: Has large periorbital hematoma on the right.  She can spontaneously open the eye slightly.  On examination there is a large subconjunctival  hemorrhage with chemosis.  The pupils are symmetric about 3 mm.  Extraocular motions are intact. Fluorescein  stain on the right does not show any uptake.  No streaming. Tono-Pen pressures have pressure of 22 on the right and 17 on the left. Patient has vision present.  She can identify and count fingers.  Neck:     Comments: No midline tenderness. Cardiovascular:     Rate and Rhythm: Normal rate. Rhythm irregular.  Pulmonary:     Effort: Pulmonary effort is normal.     Breath sounds: Normal breath sounds.  Chest:     Chest wall: No tenderness.  Abdominal:     General: There is no distension.     Palpations: Abdomen is soft.     Tenderness: There is no abdominal tenderness. There is no guarding.  Musculoskeletal:        General: No swelling or deformity. Normal range of motion.  Skin:    General: Skin is warm and dry.  Neurological:     Comments: Patient is alert.  Family members reports she does have dementia.  She has some limited amount of recall but speech is clear and situationally appropriate.  She can help in following commands.  No focal neurologic deficits for motor exam.  Psychiatric:        Mood and Affect: Mood normal.     (all labs ordered are listed, but only abnormal results are displayed) Labs Reviewed  COMPREHENSIVE METABOLIC PANEL WITH GFR - Abnormal; Notable for the following components:      Result Value   Glucose, Bld 126 (*)    Albumin  3.3 (*)    Total Bilirubin 1.7 (*)    All other components within normal limits  CBC - Abnormal; Notable for the following components:   WBC 3.7 (*)    MCV 100.3 (*)    All other components within normal limits  PROTIME-INR - Abnormal; Notable for the following components:   Prothrombin Time 19.7 (*)    INR 1.6 (*)    All other components within normal limits  I-STAT CHEM 8, ED - Abnormal; Notable for the following components:   Glucose, Bld 125 (*)    Calcium , Ion 1.10 (*)    All other components within normal limits   I-STAT CG4 LACTIC ACID, ED - Abnormal; Notable for the following components:   Lactic Acid, Venous 2.7 (*)    All other components within normal limits  ETHANOL  URINALYSIS, ROUTINE W REFLEX MICROSCOPIC  SAMPLE TO BLOOD BANK    EKG: None  Radiology: CT Maxillofacial Wo Contrast Result Date: 06/05/2024 CLINICAL DATA:  Fall.  Right eye swelling. EXAM: CT MAXILLOFACIAL WITHOUT CONTRAST TECHNIQUE: Multidetector CT imaging  of the maxillofacial structures was performed. Multiplanar CT image reconstructions were also generated. RADIATION DOSE REDUCTION: This exam was performed according to the departmental dose-optimization program which includes automated exposure control, adjustment of the mA and/or kV according to patient size and/or use of iterative reconstruction technique. COMPARISON:  None Available. FINDINGS: Osseous: No fracture or mandibular dislocation. No destructive process. Orbits: There is a depressed fracture through the right orbital floor. Fragment is depressed approximately 8 mm. Herniated orbital fat noted. No evidence of muscular entrapment. High-density noted in the retro bulbar fat posterior to the right globe compatible with hemorrhage. Globe is intact. No left orbital fracture. Sinuses: Layering blood in the right maxillary sinus. Soft tissues: Soft tissue swelling over the right face, orbit and forehead. Limited intracranial: See head CT report IMPRESSION: Depressed right orbital floor fracture with herniated orbital fat. No muscular entrapment. High density behind the right globe compatible with retro bulbar hemorrhage. Electronically Signed   By: Franky Crease M.D.   On: 06/05/2024 21:08   CT CERVICAL SPINE WO CONTRAST Result Date: 06/05/2024 CLINICAL DATA:  Polytrauma, blunt.  Fall. EXAM: CT CERVICAL SPINE WITHOUT CONTRAST TECHNIQUE: Multidetector CT imaging of the cervical spine was performed without intravenous contrast. Multiplanar CT image reconstructions were also generated.  RADIATION DOSE REDUCTION: This exam was performed according to the departmental dose-optimization program which includes automated exposure control, adjustment of the mA and/or kV according to patient size and/or use of iterative reconstruction technique. COMPARISON:  None Available. FINDINGS: Alignment: Normal Skull base and vertebrae: No acute fracture. No primary bone lesion or focal pathologic process. Soft tissues and spinal canal: No prevertebral fluid or swelling. No visible canal hematoma. Disc levels: Prior ACDF from C3-C6. Degenerative disc disease with disc space narrowing and spurring above and below the fusion levels. Moderate degenerative facet disease bilaterally. Upper chest: Right pleural effusion partially visualized. Other: None IMPRESSION: Degenerative and postoperative changes in the cervical spine. No acute bony abnormality. Right pleural effusion partially visualized in the right chest. Electronically Signed   By: Franky Crease M.D.   On: 06/05/2024 19:15   CT HEAD WO CONTRAST Result Date: 06/05/2024 CLINICAL DATA:  Fall.  Head trauma. EXAM: CT HEAD WITHOUT CONTRAST TECHNIQUE: Contiguous axial images were obtained from the base of the skull through the vertex without intravenous contrast. RADIATION DOSE REDUCTION: This exam was performed according to the departmental dose-optimization program which includes automated exposure control, adjustment of the mA and/or kV according to patient size and/or use of iterative reconstruction technique. COMPARISON:  05/09/2022 FINDINGS: Brain: There is atrophy and chronic small vessel disease changes. No acute intracranial abnormality. Specifically, no hemorrhage, hydrocephalus, mass lesion, acute infarction, or significant intracranial injury. Vascular: No hyperdense vessel or unexpected calcification. Skull: No acute calvarial abnormality. Sinuses/Orbits: Soft tissue swelling overlying the right orbit and face. There is a depressed right orbital floor  fracture with herniation of orbital fat. No entrapment. Globes are intact. High-density noted posterior to the right globe compatible with retrobulbar hemorrhage. Layering blood within the right maxillary sinus. Other: Soft tissue swelling noted over the right forehead. IMPRESSION: Atrophy, chronic microvascular disease. No acute intracranial abnormality. Depressed right orbital floor fracture with herniated orbital fat but no muscular entrapment. High-density noted posterior to the right globe compatible with retro bulbar hemorrhage. Electronically Signed   By: Franky Crease M.D.   On: 06/05/2024 19:13   DG Pelvis Portable Result Date: 06/05/2024 CLINICAL DATA:  Trauma EXAM: PORTABLE PELVIS 1-2 VIEWS COMPARISON:  None Available. FINDINGS: There  is no evidence of pelvic fracture or diastasis. No pelvic bone lesions are seen. IMPRESSION: Negative. Electronically Signed   By: Franky Crease M.D.   On: 06/05/2024 18:14   DG Chest Port 1 View Result Date: 06/05/2024 CLINICAL DATA:  Trauma EXAM: PORTABLE CHEST 1 VIEW COMPARISON:  02/12/2023 FINDINGS: Cardiomegaly, vascular congestion. Interstitial prominence could reflect interstitial edema. Aortic atherosclerosis. No effusions or acute bony abnormality. Old healed right rib fractures. IMPRESSION: Cardiomegaly, vascular congestion and interstitial prominence which could reflect interstitial edema. Electronically Signed   By: Franky Crease M.D.   On: 06/05/2024 18:14     Procedures   Medications Ordered in the ED  traMADol  (ULTRAM ) tablet 50 mg (50 mg Oral Given 06/05/24 2040)  acetaminophen  (TYLENOL ) tablet 1,000 mg (1,000 mg Oral Given 06/05/24 2040)  fluorescein  ophthalmic strip 1 strip (1 strip Right Eye Given 06/05/24 2040)  tetracaine  (PONTOCAINE) 0.5 % ophthalmic solution 2 drop (2 drops Both Eyes Given 06/05/24 2040)                                    Medical Decision Making Amount and/or Complexity of Data Reviewed Labs: ordered. Radiology:  ordered.  Risk OTC drugs. Prescription drug management.   Patient presents as outlined with a fall.  She is anticoagulated on Eliquis  for atrial fibrillation.  Patient is alert.  Agree with CT scans head neck and plain films chest and pelvis.  CT head shows no intracranial injury.  Depressed orbital fracture with some fat entrapment and retrobulbar hemorrhage. CT C-spine no acute fracture.  Consult: Reviewed with Dr. Octavia ophthalmology.  We discussed the patient's CT imaging, anticoagulation and current physical examination.  At this time recommendations are for symptomatic treatment for pain and follow-up in the office for monitoring of orbital fracture with retrobulbar hemorrhage.  At this time no acute interventions advised.  I have reviewed follow-up plan and careful return precautions with the patient and family members at bedside.  She does have large periorbital hematoma but extraocular motions are intact and vision is intact without any appearance of globe injury.  Fluorescein  staining and Tono-Pen pressures done at bedside.  Patient is anticoagulated for atrial fibrillation.  I have recommended holding her next 2 doses of Eliquis  and keeping head of bed elevated with ice.  Patient will take tramadol  and Tylenol  for pain control.  Immediate return precautions include any mental status change, uncontrolled pain or other concerning symptoms.     Final diagnoses:  Closed fracture of orbit, initial encounter [facility])  Fall, initial encounter  Anticoagulated    ED Discharge Orders          Ordered    traMADol  (ULTRAM ) 50 MG tablet        06/05/24 2216               Armenta Canning, MD 06/15/24 1432

## 2024-06-05 NOTE — Progress Notes (Signed)
 Orthopedic Tech Progress Note Patient Details:  Phyllis Knight [birth year] [id number]  Level II trauma, no ortho tech orders at this time.  Patient ID: Phyllis Knight, female   DOB: 1941/03/27, 83 y.o.   MRN: [id number]  [name] 06/05/2024, 6:52 PM

## 2024-06-05 NOTE — ED Notes (Signed)
PTAR CALLED  °

## 2024-06-05 NOTE — Discharge Instructions (Addendum)
 1.  Do not take your Eliquis  for the next 2 doses. 2.  Continue your other medications. 3.  You may take 1-2 tramadol  tablets every 6 hours with extra strength Tylenol  for pain control. 4.  Keep your head elevated at about 30 degrees while you are sitting down or sleeping.  You may have [place] well wrapped ice pack to the face for swelling. 5.  You will need follow-up appointments with the ophthalmologist to monitor the amount of swelling you have behind your eye.  Contact information for [facility] is included in your discharge instructions.  Call first thing Monday morning when they open and let them know you are seen in the emergency department and need a follow-up appointment Monday or Tuesday.  Your case was reviewed with Dr. Lonni Bruckner.

## 2024-06-05 NOTE — ED Notes (Signed)
 Husband at bedside.

## 2024-06-16 ENCOUNTER — Inpatient Hospital Stay (HOSPITAL_COMMUNITY)
Admission: EM | Admit: 2024-06-16 | Discharge: 2024-06-25 | DRG: 871 | Disposition: A | Discharge status: Skilled Nursing Facility | Attending: Family Medicine | Admitting: Family Medicine

## 2024-06-16 ENCOUNTER — Emergency Department (HOSPITAL_COMMUNITY)

## 2024-06-16 ENCOUNTER — Other Ambulatory Visit: Payer: Self-pay

## 2024-06-16 ENCOUNTER — Inpatient Hospital Stay (HOSPITAL_COMMUNITY)

## 2024-06-16 ENCOUNTER — Encounter (HOSPITAL_COMMUNITY): Payer: Self-pay | Admitting: Emergency Medicine

## 2024-06-16 DIAGNOSIS — G9341 Metabolic encephalopathy: Secondary | ICD-10-CM | POA: Diagnosis present

## 2024-06-16 DIAGNOSIS — R6521 Severe sepsis with septic shock: Secondary | ICD-10-CM | POA: Diagnosis present

## 2024-06-16 DIAGNOSIS — R627 Adult failure to thrive: Secondary | ICD-10-CM | POA: Diagnosis present

## 2024-06-16 DIAGNOSIS — N179 Acute kidney failure, unspecified: Secondary | ICD-10-CM | POA: Diagnosis not present

## 2024-06-16 DIAGNOSIS — I509 Heart failure, unspecified: Secondary | ICD-10-CM | POA: Diagnosis not present

## 2024-06-16 DIAGNOSIS — R570 Cardiogenic shock: Secondary | ICD-10-CM | POA: Diagnosis present

## 2024-06-16 DIAGNOSIS — E872 Acidosis, unspecified: Secondary | ICD-10-CM | POA: Diagnosis present

## 2024-06-16 DIAGNOSIS — Z515 Encounter for palliative care: Secondary | ICD-10-CM

## 2024-06-16 DIAGNOSIS — I110 Hypertensive heart disease with heart failure: Secondary | ICD-10-CM | POA: Diagnosis present

## 2024-06-16 DIAGNOSIS — I480 Paroxysmal atrial fibrillation: Secondary | ICD-10-CM | POA: Diagnosis present

## 2024-06-16 DIAGNOSIS — E878 Other disorders of electrolyte and fluid balance, not elsewhere classified: Secondary | ICD-10-CM | POA: Diagnosis not present

## 2024-06-16 DIAGNOSIS — G309 Alzheimer's disease, unspecified: Secondary | ICD-10-CM | POA: Diagnosis present

## 2024-06-16 DIAGNOSIS — W19XXXA Unspecified fall, initial encounter: Secondary | ICD-10-CM | POA: Diagnosis present

## 2024-06-16 DIAGNOSIS — J9601 Acute respiratory failure with hypoxia: Secondary | ICD-10-CM | POA: Diagnosis present

## 2024-06-16 DIAGNOSIS — E43 Unspecified severe protein-calorie malnutrition: Secondary | ICD-10-CM | POA: Diagnosis present

## 2024-06-16 DIAGNOSIS — Z823 Family history of stroke: Secondary | ICD-10-CM

## 2024-06-16 DIAGNOSIS — I4891 Unspecified atrial fibrillation: Secondary | ICD-10-CM | POA: Diagnosis present

## 2024-06-16 DIAGNOSIS — Z79899 Other long term (current) drug therapy: Secondary | ICD-10-CM

## 2024-06-16 DIAGNOSIS — R0602 Shortness of breath: Secondary | ICD-10-CM

## 2024-06-16 DIAGNOSIS — M797 Fibromyalgia: Secondary | ICD-10-CM | POA: Diagnosis present

## 2024-06-16 DIAGNOSIS — Z6826 Body mass index (BMI) 26.0-26.9, adult: Secondary | ICD-10-CM

## 2024-06-16 DIAGNOSIS — Z7901 Long term (current) use of anticoagulants: Secondary | ICD-10-CM

## 2024-06-16 DIAGNOSIS — F0280 Dementia in other diseases classified elsewhere without behavioral disturbance: Secondary | ICD-10-CM | POA: Diagnosis present

## 2024-06-16 DIAGNOSIS — J122 Parainfluenza virus pneumonia: Secondary | ICD-10-CM | POA: Diagnosis present

## 2024-06-16 DIAGNOSIS — Z7189 Other specified counseling: Secondary | ICD-10-CM | POA: Diagnosis not present

## 2024-06-16 DIAGNOSIS — K219 Gastro-esophageal reflux disease without esophagitis: Secondary | ICD-10-CM | POA: Diagnosis present

## 2024-06-16 DIAGNOSIS — A419 Sepsis, unspecified organism: Secondary | ICD-10-CM | POA: Diagnosis present

## 2024-06-16 DIAGNOSIS — Z8542 Personal history of malignant neoplasm of other parts of uterus: Secondary | ICD-10-CM

## 2024-06-16 DIAGNOSIS — Z885 Allergy status to narcotic agent status: Secondary | ICD-10-CM

## 2024-06-16 DIAGNOSIS — E782 Mixed hyperlipidemia: Secondary | ICD-10-CM | POA: Diagnosis present

## 2024-06-16 DIAGNOSIS — E876 Hypokalemia: Secondary | ICD-10-CM | POA: Diagnosis not present

## 2024-06-16 DIAGNOSIS — Z66 Do not resuscitate: Secondary | ICD-10-CM | POA: Diagnosis present

## 2024-06-16 DIAGNOSIS — Z981 Arthrodesis status: Secondary | ICD-10-CM

## 2024-06-16 DIAGNOSIS — J811 Chronic pulmonary edema: Secondary | ICD-10-CM | POA: Diagnosis present

## 2024-06-16 DIAGNOSIS — Z888 Allergy status to other drugs, medicaments and biological substances status: Secondary | ICD-10-CM

## 2024-06-16 DIAGNOSIS — F05 Delirium due to known physiological condition: Secondary | ICD-10-CM | POA: Diagnosis not present

## 2024-06-16 DIAGNOSIS — I2510 Atherosclerotic heart disease of native coronary artery without angina pectoris: Secondary | ICD-10-CM | POA: Diagnosis present

## 2024-06-16 DIAGNOSIS — Z1152 Encounter for screening for COVID-19: Secondary | ICD-10-CM

## 2024-06-16 DIAGNOSIS — I5041 Acute combined systolic (congestive) and diastolic (congestive) heart failure: Secondary | ICD-10-CM | POA: Diagnosis present

## 2024-06-16 DIAGNOSIS — S0231XA Fracture of orbital floor, right side, initial encounter for closed fracture: Secondary | ICD-10-CM | POA: Diagnosis present

## 2024-06-16 DIAGNOSIS — R579 Shock, unspecified: Secondary | ICD-10-CM | POA: Diagnosis present

## 2024-06-16 DIAGNOSIS — I5021 Acute systolic (congestive) heart failure: Secondary | ICD-10-CM | POA: Diagnosis not present

## 2024-06-16 DIAGNOSIS — Z96652 Presence of left artificial knee joint: Secondary | ICD-10-CM | POA: Diagnosis present

## 2024-06-16 LAB — URINALYSIS, W/ REFLEX TO CULTURE (INFECTION SUSPECTED)
Bilirubin Urine: NEGATIVE
Glucose, UA: NEGATIVE mg/dL
Ketones, ur: NEGATIVE mg/dL
Leukocytes,Ua: NEGATIVE
Nitrite: NEGATIVE
Protein, ur: 30 mg/dL — AB
Specific Gravity, Urine: >1.046 — ABNORMAL HIGH (ref 1.005–1.030)
pH: 5 (ref 5.0–8.0)

## 2024-06-16 LAB — CBC WITH DIFFERENTIAL/PLATELET
Abs Immature Granulocytes: 0.02 K/uL (ref 0.00–0.07)
Basophils Absolute: 0 K/uL (ref 0.0–0.1)
Basophils Relative: 1 %
Eosinophils Absolute: 0 K/uL (ref 0.0–0.5)
Eosinophils Relative: 0 %
HCT: 38 % (ref 36.0–46.0)
Hemoglobin: 12.2 g/dL (ref 12.0–15.0)
Immature Granulocytes: 1 %
Lymphocytes Relative: 17 %
Lymphs Abs: 0.6 K/uL — ABNORMAL LOW (ref 0.7–4.0)
MCH: 32.1 pg (ref 26.0–34.0)
MCHC: 32.1 g/dL (ref 30.0–36.0)
MCV: 100 fL (ref 80.0–100.0)
Monocytes Absolute: 0.3 K/uL (ref 0.1–1.0)
Monocytes Relative: 7 %
Neutro Abs: 2.6 K/uL (ref 1.7–7.7)
Neutrophils Relative %: 74 %
Platelets: 119 K/uL — ABNORMAL LOW (ref 150–400)
RBC: 3.8 MIL/uL — ABNORMAL LOW (ref 3.87–5.11)
RDW: 15 % (ref 11.5–15.5)
WBC: 3.5 K/uL — ABNORMAL LOW (ref 4.0–10.5)
nRBC: 0 % (ref 0.0–0.2)

## 2024-06-16 LAB — I-STAT VENOUS BLOOD GAS, ED
Acid-base deficit: 7 mmol/L — ABNORMAL HIGH (ref 0.0–2.0)
Acid-base deficit: 7 mmol/L — ABNORMAL HIGH (ref 0.0–2.0)
Bicarbonate: 17.5 mmol/L — ABNORMAL LOW (ref 20.0–28.0)
Bicarbonate: 19 mmol/L — ABNORMAL LOW (ref 20.0–28.0)
Calcium, Ion: 1.02 mmol/L — ABNORMAL LOW (ref 1.15–1.40)
Calcium, Ion: 1.05 mmol/L — ABNORMAL LOW (ref 1.15–1.40)
HCT: 37 % (ref 36.0–46.0)
HCT: 38 % (ref 36.0–46.0)
Hemoglobin: 12.6 g/dL (ref 12.0–15.0)
Hemoglobin: 12.9 g/dL (ref 12.0–15.0)
O2 Saturation: 96 %
O2 Saturation: 99 %
Potassium: 4 mmol/L (ref 3.5–5.1)
Potassium: 5.2 mmol/L — ABNORMAL HIGH (ref 3.5–5.1)
Sodium: 136 mmol/L (ref 135–145)
Sodium: 137 mmol/L (ref 135–145)
TCO2: 18 mmol/L — ABNORMAL LOW (ref 22–32)
TCO2: 20 mmol/L — ABNORMAL LOW (ref 22–32)
pCO2, Ven: 30.8 mmHg — ABNORMAL LOW (ref 44–60)
pCO2, Ven: 37.5 mmHg — ABNORMAL LOW (ref 44–60)
pH, Ven: 7.313 (ref 7.25–7.43)
pH, Ven: 7.363 (ref 7.25–7.43)
pO2, Ven: 147 mmHg — ABNORMAL HIGH (ref 32–45)
pO2, Ven: 81 mmHg — ABNORMAL HIGH (ref 32–45)

## 2024-06-16 LAB — BRAIN NATRIURETIC PEPTIDE: B Natriuretic Peptide: 3064.7 pg/mL — ABNORMAL HIGH (ref 0.0–100.0)

## 2024-06-16 LAB — FIBRINOGEN: Fibrinogen: 352 mg/dL (ref 210–475)

## 2024-06-16 LAB — PROTIME-INR
INR: 1.8 — ABNORMAL HIGH (ref 0.8–1.2)
INR: 1.8 — ABNORMAL HIGH (ref 0.8–1.2)
Prothrombin Time: 21.5 s — ABNORMAL HIGH (ref 11.4–15.2)
Prothrombin Time: 21.6 s — ABNORMAL HIGH (ref 11.4–15.2)

## 2024-06-16 LAB — COMPREHENSIVE METABOLIC PANEL WITH GFR
ALT: 103 U/L — ABNORMAL HIGH (ref 0–44)
AST: 250 U/L — ABNORMAL HIGH (ref 15–41)
Albumin: 3 g/dL — ABNORMAL LOW (ref 3.5–5.0)
Alkaline Phosphatase: 70 U/L (ref 38–126)
Anion gap: 15 (ref 5–15)
BUN: 14 mg/dL (ref 8–23)
CO2: 16 mmol/L — ABNORMAL LOW (ref 22–32)
Calcium: 8.5 mg/dL — ABNORMAL LOW (ref 8.9–10.3)
Chloride: 105 mmol/L (ref 98–111)
Creatinine, Ser: 0.97 mg/dL (ref 0.44–1.00)
GFR, Estimated: 58 mL/min — ABNORMAL LOW (ref >60)
Glucose, Bld: 120 mg/dL — ABNORMAL HIGH (ref 70–99)
Potassium: 3.8 mmol/L (ref 3.5–5.1)
Sodium: 136 mmol/L (ref 135–145)
Total Bilirubin: 2.1 mg/dL — ABNORMAL HIGH (ref 0.0–1.2)
Total Protein: 7.3 g/dL (ref 6.5–8.1)

## 2024-06-16 LAB — RESP PANEL BY RT-PCR (RSV, FLU A&B, COVID)  RVPGX2
Influenza A by PCR: NEGATIVE
Influenza B by PCR: NEGATIVE
Resp Syncytial Virus by PCR: NEGATIVE
SARS Coronavirus 2 by RT PCR: NEGATIVE

## 2024-06-16 LAB — I-STAT CG4 LACTIC ACID, ED
Lactic Acid, Venous: 6.4 mmol/L (ref 0.5–1.9)
Lactic Acid, Venous: 7.8 mmol/L (ref 0.5–1.9)

## 2024-06-16 LAB — TROPONIN I (HIGH SENSITIVITY)
Troponin I (High Sensitivity): 160 ng/L (ref <18)
Troponin I (High Sensitivity): 243 ng/L (ref <18)
Troponin I (High Sensitivity): 306 ng/L (ref <18)
Troponin I (High Sensitivity): 239 ng/L (ref <18)

## 2024-06-16 LAB — COOXEMETRY PANEL
Carboxyhemoglobin: 1.8 % — ABNORMAL HIGH (ref 0.5–1.5)
Methemoglobin: <0.7 % (ref 0.0–1.5)
O2 Saturation: 63.3 %
Total hemoglobin: 11.7 g/dL — ABNORMAL LOW (ref 12.0–16.0)

## 2024-06-16 LAB — LDL CHOLESTEROL, DIRECT: Direct LDL: 47 mg/dL (ref 0–99)

## 2024-06-16 LAB — APTT: aPTT: 36 s (ref 24–36)

## 2024-06-16 LAB — PLATELET COUNT: Platelets: 112 K/uL — ABNORMAL LOW (ref 150–400)

## 2024-06-16 LAB — PROCALCITONIN: Procalcitonin: 2.89 ng/mL

## 2024-06-16 LAB — MRSA NEXT GEN BY PCR, NASAL: MRSA by PCR Next Gen: NOT DETECTED

## 2024-06-16 LAB — LACTIC ACID, PLASMA: Lactic Acid, Venous: 3.9 mmol/L (ref 0.5–1.9)

## 2024-06-16 LAB — CK: Total CK: 134 U/L (ref 38–234)

## 2024-06-16 LAB — BILIRUBIN, DIRECT: Bilirubin, Direct: 0.9 mg/dL — ABNORMAL HIGH (ref 0.0–0.2)

## 2024-06-16 MED ORDER — ALBUMIN HUMAN 25 % IV SOLN
25.0000 g | Freq: Four times a day (QID) | INTRAVENOUS | Status: AC
  Administered 2024-06-16 – 2024-06-17 (×4): 25 g via INTRAVENOUS
  Filled 2024-06-16 (×4): qty 100

## 2024-06-16 MED ORDER — PIPERACILLIN-TAZOBACTAM 3.375 G IVPB
3.3750 g | Freq: Three times a day (TID) | INTRAVENOUS | Status: AC
  Administered 2024-06-16 – 2024-06-19 (×10): 3.375 g via INTRAVENOUS
  Filled 2024-06-16 (×10): qty 50

## 2024-06-16 MED ORDER — HEPARIN SODIUM (PORCINE) 5000 UNIT/ML IJ SOLN
5000.0000 [IU] | Freq: Three times a day (TID) | INTRAMUSCULAR | Status: DC
  Filled 2024-06-16: qty 1

## 2024-06-16 MED ORDER — PANTOPRAZOLE SODIUM 40 MG PO TBEC
40.0000 mg | DELAYED_RELEASE_TABLET | Freq: Every day | ORAL | Status: DC | PRN
  Administered 2024-06-23: 40 mg via ORAL
  Filled 2024-06-16: qty 1

## 2024-06-16 MED ORDER — ORAL CARE MOUTH RINSE
15.0000 mL | OROMUCOSAL | Status: DC
  Administered 2024-06-16 – 2024-06-25 (×33): 15 mL via OROMUCOSAL

## 2024-06-16 MED ORDER — AMIODARONE LOAD VIA INFUSION
150.0000 mg | Freq: Once | INTRAVENOUS | Status: AC
  Administered 2024-06-16: 150 mg via INTRAVENOUS
  Filled 2024-06-16: qty 83.34

## 2024-06-16 MED ORDER — AMIODARONE HCL IN DEXTROSE 360-4.14 MG/200ML-% IV SOLN
60.0000 mg/h | INTRAVENOUS | Status: AC
  Administered 2024-06-16 (×2): 60 mg/h via INTRAVENOUS
  Filled 2024-06-16 (×2): qty 200

## 2024-06-16 MED ORDER — LACTATED RINGERS IV SOLN: INTRAVENOUS | Status: DC

## 2024-06-16 MED ORDER — AMIODARONE HCL IN DEXTROSE 360-4.14 MG/200ML-% IV SOLN
30.0000 mg/h | INTRAVENOUS | Status: DC
  Administered 2024-06-17 – 2024-06-18 (×3): 30 mg/h via INTRAVENOUS
  Filled 2024-06-16 (×3): qty 200

## 2024-06-16 MED ORDER — IPRATROPIUM-ALBUTEROL 0.5-2.5 (3) MG/3ML IN SOLN
3.0000 mL | RESPIRATORY_TRACT | Status: DC | PRN
  Administered 2024-06-18 – 2024-06-19 (×3): 3 mL via RESPIRATORY_TRACT
  Filled 2024-06-16 (×3): qty 3

## 2024-06-16 MED ORDER — ROSUVASTATIN CALCIUM 20 MG PO TABS
20.0000 mg | ORAL_TABLET | Freq: Every day | ORAL | Status: DC
  Administered 2024-06-16 – 2024-06-20 (×5): 20 mg via ORAL
  Filled 2024-06-16 (×5): qty 1

## 2024-06-16 MED ORDER — METRONIDAZOLE 500 MG/100ML IV SOLN
500.0000 mg | Freq: Once | INTRAVENOUS | Status: AC
  Administered 2024-06-16: 500 mg via INTRAVENOUS
  Filled 2024-06-16: qty 100

## 2024-06-16 MED ORDER — IOHEXOL 350 MG/ML SOLN
100.0000 mL | Freq: Once | INTRAVENOUS | Status: AC | PRN
  Administered 2024-06-16: 100 mL via INTRAVENOUS

## 2024-06-16 MED ORDER — POLYETHYLENE GLYCOL 3350 17 G PO PACK: 17.0000 g | PACK | Freq: Every day | ORAL | Status: DC | PRN

## 2024-06-16 MED ORDER — APIXABAN 5 MG PO TABS
5.0000 mg | ORAL_TABLET | Freq: Two times a day (BID) | ORAL | Status: DC
  Administered 2024-06-16 – 2024-06-25 (×18): 5 mg via ORAL
  Filled 2024-06-16 (×18): qty 1

## 2024-06-16 MED ORDER — LACTATED RINGERS IV BOLUS (SEPSIS): 250.0000 mL | Freq: Once | INTRAVENOUS | Status: DC

## 2024-06-16 MED ORDER — ACETAMINOPHEN 500 MG PO TABS
1000.0000 mg | ORAL_TABLET | Freq: Once | ORAL | Status: AC
  Administered 2024-06-16: 1000 mg via ORAL
  Filled 2024-06-16: qty 2

## 2024-06-16 MED ORDER — DOCUSATE SODIUM 100 MG PO CAPS: 100.0000 mg | ORAL_CAPSULE | Freq: Two times a day (BID) | ORAL | Status: DC | PRN

## 2024-06-16 MED ORDER — SODIUM CHLORIDE 0.9 % IV SOLN
2.0000 g | Freq: Once | INTRAVENOUS | Status: AC
  Administered 2024-06-16: 2 g via INTRAVENOUS
  Filled 2024-06-16: qty 10

## 2024-06-16 MED ORDER — LACTATED RINGERS IV BOLUS (SEPSIS)
1000.0000 mL | Freq: Once | INTRAVENOUS | Status: AC
  Administered 2024-06-16: 1000 mL via INTRAVENOUS

## 2024-06-16 MED ORDER — CALCIUM GLUCONATE-NACL 2-0.675 GM/100ML-% IV SOLN
2.0000 g | Freq: Once | INTRAVENOUS | Status: AC
  Administered 2024-06-16: 2000 mg via INTRAVENOUS
  Filled 2024-06-16: qty 100

## 2024-06-16 MED ORDER — ACETAMINOPHEN 325 MG PO TABS
650.0000 mg | ORAL_TABLET | Freq: Four times a day (QID) | ORAL | Status: DC | PRN
  Administered 2024-06-16 – 2024-06-25 (×5): 650 mg via ORAL
  Filled 2024-06-16 (×5): qty 2

## 2024-06-16 MED ORDER — VANCOMYCIN HCL IN DEXTROSE 1-5 GM/200ML-% IV SOLN
1000.0000 mg | Freq: Once | INTRAVENOUS | Status: AC
  Administered 2024-06-16: 1000 mg via INTRAVENOUS
  Filled 2024-06-16: qty 200

## 2024-06-16 MED ORDER — PHENOL 1.4 % MT LIQD
1.0000 | OROMUCOSAL | Status: DC | PRN
  Administered 2024-06-17: 1 via OROMUCOSAL
  Filled 2024-06-16: qty 177

## 2024-06-16 MED ORDER — IPRATROPIUM-ALBUTEROL 0.5-2.5 (3) MG/3ML IN SOLN
3.0000 mL | Freq: Once | RESPIRATORY_TRACT | Status: DC
  Filled 2024-06-16: qty 3

## 2024-06-16 MED ORDER — METHYLPREDNISOLONE SODIUM SUCC 125 MG IJ SOLR
125.0000 mg | Freq: Once | INTRAMUSCULAR | Status: AC
  Administered 2024-06-16: 125 mg via INTRAVENOUS
  Filled 2024-06-16: qty 2

## 2024-06-16 MED ORDER — PIPERACILLIN-TAZOBACTAM 3.375 G IVPB 30 MIN
3.3750 g | Freq: Once | INTRAVENOUS | Status: AC
  Administered 2024-06-16: 3.375 g via INTRAVENOUS
  Filled 2024-06-16: qty 50

## 2024-06-16 MED ORDER — ORAL CARE MOUTH RINSE: 15.0000 mL | OROMUCOSAL | Status: DC | PRN

## 2024-06-16 MED ORDER — VANCOMYCIN HCL 500 MG/100ML IV SOLN
500.0000 mg | Freq: Once | INTRAVENOUS | Status: AC
  Administered 2024-06-16: 500 mg via INTRAVENOUS
  Filled 2024-06-16 (×2): qty 100

## 2024-06-16 MED ORDER — VANCOMYCIN HCL IN DEXTROSE 1-5 GM/200ML-% IV SOLN
1000.0000 mg | INTRAVENOUS | Status: DC
  Filled 2024-06-16: qty 200

## 2024-06-16 MED ORDER — QUETIAPINE FUMARATE 50 MG PO TABS
50.0000 mg | ORAL_TABLET | Freq: Every day | ORAL | Status: DC
  Administered 2024-06-16 – 2024-06-19 (×4): 50 mg via ORAL
  Filled 2024-06-16 (×4): qty 1

## 2024-06-16 NOTE — Progress Notes (Signed)
 eLink Physician-Brief Progress Note Patient Name: CHEVIE BIRKHEAD DOB: Mar 07, 1941 MRN: [id number]   Date of Service  06/16/2024  HPI/Events of Note  Mixed septic and cardiogenic shock, baseline dementia, resp failure  Very anxious, likely sun downing Complaining of throat pain  eICU Interventions  +Chloraseptic spray +Resume home seroquel    0202 - sounds very congested and wet sounding to ausculatation. Sounds like a rattlingwhen listening to patient. No intervention. NT suctioning if difficulty expectorating secretions. Continue stable [place]  [id number] -remains in atrial fibrillation with rapid ventricular response.  Intermittently going up to the 80s but sustaining primarily in the 100s-110s.  No intervention for now.  Maintain amiodarone  at 30 mg dosing  Intervention Category Minor Interventions: Routine modifications to care plan (e.g. PRN medications for pain, fever)  Jaqwon Manfred 06/16/2024, 9:02 PM

## 2024-06-16 NOTE — ED Provider Notes (Addendum)
 Physical Exam  BP (!) 151/114 (BP Location: Left Arm)   Pulse (!) 154   Temp (!) 103.6 F (39.8 C) (Rectal)   Resp (!) 34   SpO2 100%   Physical Exam  Procedures  .Critical Care  Performed by: Jerrol Agent, MD Authorized by: Jerrol Agent, MD   Critical care provider statement:    Critical care time (minutes):  80   Critical care was necessary to treat or prevent imminent or life-threatening deterioration of the following conditions:  Sepsis   Critical care was time spent personally by me on the following activities:  Development of treatment plan with patient or surrogate, discussions with consultants, evaluation of patient's response to treatment, examination of patient, ordering and review of laboratory studies, ordering and review of radiographic studies, ordering and performing treatments and interventions, pulse oximetry, re-evaluation of patient's condition and review of old charts   Care discussed with: admitting provider     ED Course / MDM    Medical Decision Making Amount and/or Complexity of Data Reviewed Labs: ordered. Radiology: ordered.  Risk OTC drugs. Prescription drug management. Decision regarding hospitalization.   36F presenting with concern for sepsis. Getting abx and needs imaging evaluation.   Labs: BNP elevated at 3065, initial lactic acid 7.8, repeat 6.4 after initial 1 L fluids, on exam the patient was diffusely wheezing with some rhonchi present.  She has an acidosis with a bicarbonate of 16, anion gap of 15, LFTs elevated with an AST of 250, ALT of 103, T. bili elevated 2.1, has a leukopenia to 3.5, hemoglobin 12.2, platelets 119, cardiac troponin 160, climbing to 243. Pt is tachycardic to the 160s despite initial fluids.  COVID flu and RSV PCR testing negative, initial i-STAT VBG revealed pH of 7.36, pCO2 31, HCO3 17.5.  Covered with broad-spectrum antibiotics.  Hypoxic, initially on BiPAP, now on 3 L O2 via nasal cannula.  Tachypneic to the  30s.  Concern for sepsis from unclear etiology, respiratory versus genitourinary source, considered intra-abdominal source.  Considered viral infectious etiology, considered mesenteric ischemia, bowel catastrophe, aortic dissection, ACS, PE.  Considered CHF exacerbation.  On exam the patient has bilateral lower extremity edema. She has wheezing and rhonchi bilaterally.  CT Head: IMPRESSION:  Atrophy, chronic microvascular disease.    No acute intracranial abnormality.    No significant change since prior study.    CT Chest Abd Pelvis:  IMPRESSION:  1. No aortic aneurysm, intramural hematoma, or aortic dissection.  2. Mild cardiomegaly with findings of pulmonary edema. Small right  and trace left pleural effusions.  3. Circumferential wall thickening of the urinary bladder, which may  be due to underdistension. If there is concern for acute cystitis,  correlation with urinalysis would be recommended.  4. Small hyperdense nodule layering in the cecum, measuring 1 cm  (axial 205). While this could represent a small region of  inspissated stool, possibly within a diverticulum, an enhancing  polyp or neoplasm could have this appearance in the correct clinical  context. Correlation with fecal Hemoccult recommended. Nonemergent  colonoscopy could also be considered.  5. Presacral edema and diffuse anasarca, likely related to the  patient's volume status.    Aortic Atherosclerosis (ICD10-I70.0).    Given concern for sepsis with additional concern for CHF with whole body fluid overload, lactic close to 8, atrial fibrillation with RVR, PCCM consulted for admission, fluids cut off after 1L due to mixed septic and cardiogenic shock, UA pending at time of admission. Family updated regarding the  plan of care.         Jerrol Agent, MD 06/16/24 1656    Jerrol Agent, MD 06/16/24 1657    Jerrol Agent, MD 06/17/24 1120

## 2024-06-16 NOTE — Sepsis Progress Note (Signed)
 eLink is following this Code Sepsis.

## 2024-06-16 NOTE — ED Notes (Signed)
 Pt transported to CT for code medical

## 2024-06-16 NOTE — Procedures (Addendum)
 Central Venous Catheter Insertion Procedure Note  Phyllis Knight  [id number]  November 01, 1941  Date:06/16/24  Time:7:04 PM   Provider Performing:Ashleigh E Drena   Procedure: Insertion of Non-tunneled Central Venous [phone]) with US  guidance (23062)   Indication(s) Medication administration  Consent Risks of the procedure as well as the alternatives and risks of each were explained to the patient and/or caregiver.  Consent for the procedure was obtained and is signed in the bedside chart  Anesthesia Topical only with 1% lidocaine    Timeout Verified patient identification, verified procedure, site/side was marked, verified correct patient position, special equipment/implants available, medications/allergies/relevant history reviewed, required imaging and test results available.  Sterile Technique Maximal sterile technique including full sterile barrier drape, hand hygiene, sterile gown, sterile gloves, mask, hair covering, sterile ultrasound probe cover (if used).  Procedure Description Area of catheter insertion was cleaned with chlorhexidine  and draped in sterile fashion.  With real-time ultrasound guidance a central venous catheter was placed into the right internal jugular vein. Nonpulsatile blood flow and easy flushing noted in all ports.  The catheter was sutured in place and sterile dressing applied.  Complications/Tolerance None; patient tolerated the procedure well. Chest X-ray is ordered to verify placement for internal jugular or subclavian cannulation.   Chest x-ray is not ordered for femoral cannulation.  EBL Minimal  Specimen(s) None   Romualdo Drena S-NP with direct guidance from Maude Banner ACNP

## 2024-06-16 NOTE — ED Triage Notes (Addendum)
 Pt arrived via [facility] c/o SOB starting this AM and getting increasingly worse throughout the day. Pt also reports painful urination for the past 3 days, EMS reported foul smelling urine upon arrival. Pt arrived to ED on Bipap and that is being continued. Pt has significant bruising on the right side of her face from a fall last week.  EMS Vitals ET 20 34 RR 88% RA 99% Bipap 140-160 HR Rhonchi and wheezing heard by EMS

## 2024-06-16 NOTE — Progress Notes (Signed)
 Pharmacy Antibiotic Note  Phyllis Knight is a 83 y.o. female admitted on 06/16/2024 with bacteremia.  Pharmacy has been consulted for vancomycin  dosing.   Plan: Pt received Vancomycin  1000mg  IV once as LD  Give vancomycin  500mg  IV once to complete LD Initiate Vancomycin  1000mg  IV q24h  Est AUC 460 Monitor CBC, Scr, and s/sx of infection resolution Evaluate opportunities to de-escalate antibiotics as appropriate      Temp (24hrs), Avg:99.8 F (37.7 C), Min:97.6 F (36.4 C), Max:103.6 F (39.8 C)  Recent Labs  Lab 06/16/24 1255 06/16/24 1445 06/16/24 1506 06/16/24 1514  WBC 3.5*  --   --   --   CREATININE  --   --  0.97  --   LATICACIDVEN  --  7.8*  --  6.4*    Estimated Creatinine Clearance: 45.9 mL/min (by C-G formula based on SCr of 0.97 mg/dL).    Allergies  Allergen Reactions   Duloxetine Hcl Other (See Comments)    jumping legs   Cefuroxime Rash   Codeine Nausea And Vomiting   Zofran  [Ondansetron  Hcl] Rash    Antimicrobials this admission: Zosyn  3.375g IV 7/16 >>  Completed:  Vancomycin  1000mg  IV x1 7/16 aztreonam  2g IV x1 7/16 metronidazole  500mg  IV x1 7/16    Dose adjustments this admission: N/A  Microbiology results: 7/16 BCx: pending   Thank you for allowing pharmacy to be a part of this patient's care.  Jo-Ann Johanning 06/16/2024 5:55 PM

## 2024-06-16 NOTE — H&P (Cosign Needed Addendum)
 NAME:  Phyllis Knight, MRN:  [id number], DOB:  12-01-1941, LOS: 0 ADMISSION DATE:  06/16/2024, CONSULTATION DATE:  06/16/24 REFERRING MD:  Toribio Sharps, CHIEF COMPLAINT:  SOB   History of Present Illness:  83 year old female with PMH significant for HTN, Atrial Fibrillation on Eliquis , Dementia and HLD presenting to the ED with new onset shortness of breath. She has had ongoing confusion for the past week and reports painful urination. Of note, patient was recently seen in the ED after a fall resulting in a closed orbital fracture with residual bruising to the right side of her face.   According to the family the patient has been having decreased functional ability and increase in sedentary lifestyle over the last year. She has been diagnosed with dementia and on a normal day is able to eat, drink and converse normally however she spends the majority of the day in a recliner. Over the past week the husband has noticed increase confusion as well as instances of incontinence which is new for her.  Workup in the ED revealed UA concerning for UTI with lactate of 8 and upon administration of fluid became increasingly short of breath with evidence of pulmonary edema.   Pertinent  Medical History  Atrial Fibrillation Dementia HTN HLD  Significant Hospital Events: Including procedures, antibiotic start and stop dates in addition to other pertinent events   7/16: Admit to ICU, echo ordered, inotropes and amio drip initiated  Interim History / Subjective:  Patient is confused but does endorse some pain in abdomen   Objective    Blood pressure (!) 146/106, pulse (!) 128, temperature 97.6 F (36.4 C), temperature source Oral, resp. rate (!) 24, SpO2 100%.    FiO2 (%):  [50 %] 50 % PEEP:  [5 cmH20] 5 cmH20 Pressure Support:  [5 cmH20] 5 cmH20   Intake/Output Summary (Last 24 hours) at 06/16/2024 1738 Last data filed at 06/16/2024 1510 Gross per 24 hour  Intake 1500 ml  Output --  Net 1500 ml    There were no vitals filed for this visit.  Examination: General: Elderly, acute and chronically ill appearing HENT: Normocephalic, eyes are non-icteric, periorbital bruising Lungs: Expiratory wheezing, crackles, use of accessory muscles Cardiovascular: irregular irregular in afib RVR, palpable pulses, +2 pitting edema in lower extremities, upper extremities are warm to palpation however lower extremities are cool Abdomen: active bowel sounds, mild pain to palpation with grimacing in RLQ and LLQ Neuro: confused, following intermittent commands, equal strength bilaterally GU: Unable to visualize urine  Resolved problem list   Assessment and Plan  Mixed septic and Cardiogenic shock with lactic acidosis  Concern for urinary source for septic shock ? Hx of HF, on home lasix  unsure why UA with +protein, culture pending  BNP 3,064 Trop 160 Plan Trend lactate  Broad spectrum abx; vanc/zosyn  Send MRSA Blood culures pending Trend trops Place CVC Trend CVP Co-ox; if low then start dobutamine Keep maps >65 Send pro-cal ECHO  Atrial Fibrillation  Echo from 2020 with EF of 65% Plan Amiodarone  drip  Replace electrolytes as indicated HR goal >110 Hold home dilt Hold home lasix  Restart home eliquis   Acute hypoxic respiratory failure Currently on [place] 3-4L Plan Keep SPO2 >92% IS if able AM VBG  Acute metabolic encephalopathy Most likely secondary to sepsis/UTI but confounded by history of dementia CT head negative  Plan Avoid delirium Follow neuro exam closely Supportive measures Send CK  Electrolyte imbalances Plan Trend CMP Replace as indicated 2g Cal  gluconate 25g of albumin  25% x1  Hx GERD Plan Restart home Protonix   Hx HLD Plan  Send LDL Restart statin   Best Practice (right click and Reselect all SmartList Selections daily)   Diet/type: Regular consistency (see orders) DVT prophylaxis DOAC Pressure ulcer(s): N/A GI prophylaxis: PPI Lines:  Central line Foley:  N/A Code Status:  DNR Last date of multidisciplinary goals of care discussion    83 year old female lives w/ family. Fairly sedentary over last year w/ family noting functional and some degree of cognitive decline as well as increased swelling of her lower extremities, as well as some difficulty with atrial fib managed by her cardiologist. At baseline able to get up to chair. Walk short distance. Oriented and able to carry out meaningful conversations. Over last week husband noted that she has been becoming more confused. Had couple episodes of both bowel and bladder incontinence and over the evening and into 7/16 this confusion worsened to point EMS was called.   On EMS arrival noted foul smelling urine. Was confused. HR 140-160 af w/ RVR. Also noted bruising to her right face and periorbital area which she sustained from a fall on 7/5 (workup from this was neg for traumatic bleed).   In ER  Wbc ct 3.5, BNP 3064, trop 160-->243, venous blood cag 7.31/37.5/147/20 lactic acid 7.8-->6.4. bicarb 16, ast and alt elevated slightly.  Cultures sent. Received 1 liter crystalloid. Antibiotics started. POCUS w/ gross decreased LV fxn and distended IVC. HR post volume 120s af.  Amiodarone  bolus ordered. Admitted to ICU   PMH  Af, HTN, mixed HLD, dementia. Last ECHO 2020 EF 60-65%  Exam  General awake. Confused. Oriented X 1-2 not in distress HENT NCAT MMM poor dentition Pulm dec bases. No accessory use on  support  Card irreg irreg w/ POCUS c/w depressed LVEF Ext 4 + pitting edema. Weeping from LEs w/ small less than dime size venous ulcers noted on posterior RLE as well as one on left. They do not appear infected but are weeping clear fluid. Temp warm, pulses palp GU incont Impression/plan  Mixed septic and cardiogenic shock (SCAI stage B)w/ lactic acidosis.  Septic component seems Urinary tract in source. Cardiac component w/ unclear chronicity but hx suggests may be an acute  on chronic problem, although septic CM on ddx  Plan Admit to ICU Control HR KVO IVFs for now  Hold home antihypertensives Central access get CVP and SCVO2 Cultures sent Strart vanc and zosyn  Send cort, get formal ECHO If MAP stays > 65 will consider low dose dobut. If scvo2 low start low dose dobut Ck serial lactates and trend BNP Sent procal   Acute hypoxic resp failure w/ pulmonary edema on cxr Plan Supplemental o2 Hold further fluid resusc Pulse ox  Am cxr   Af w/ RVR Plan Amiodarone  load Cont tele  Cont DOAC  Elevated trop Plan Trend  Echo   Acute metabolic encephalopathy superimposed on dementia  Plan Treat sepsis  Supportive care Hold sedating drugs   Mild thrombocytopenia ? Sepsis related?  Plan Trend  Last date of multidisciplinary goals of care discussion [per ipal ]  Maude FORBES Banner ACNP-BC [facility] Pulmonary/Critical Care Pager # [phone] OR # [phone] if no answer  My cct 34 min Labs   CBC: Recent Labs  Lab 06/16/24 1255 06/16/24 1444 06/16/24 1517  WBC 3.5*  --   --   NEUTROABS 2.6  --   --   HGB 12.2 12.9 12.6  HCT 38.0 38.0 37.0  MCV 100.0  --   --   PLT 119*  --   --     Basic Metabolic Panel: Recent Labs  Lab 06/16/24 1444 06/16/24 1506 06/16/24 1517  NA 136 136 137  K 5.2* 3.8 4.0  CL  --  105  --   CO2  --  16*  --   GLUCOSE  --  120*  --   BUN  --  14  --   CREATININE  --  0.97  --   CALCIUM   --  8.5*  --    GFR: Estimated Creatinine Clearance: 45.9 mL/min (by C-G formula based on SCr of 0.97 mg/dL). Recent Labs  Lab 06/16/24 1255 06/16/24 1445 06/16/24 1514  WBC 3.5*  --   --   LATICACIDVEN  --  7.8* 6.4*    Liver Function Tests: Recent Labs  Lab 06/16/24 1506  AST 250*  ALT 103*  ALKPHOS 70  BILITOT 2.1*  PROT 7.3  ALBUMIN  3.0*   No results for input(s): LIPASE, AMYLASE in the last 168 hours. No results for input(s): AMMONIA in the last 168 hours.  ABG    Component Value Date/Time    HCO3 17.5 (L) 06/16/2024 1517   TCO2 18 (L) 06/16/2024 1517   ACIDBASEDEF 7.0 (H) 06/16/2024 1517   O2SAT 96 06/16/2024 1517     Coagulation Profile: No results for input(s): INR, PROTIME in the last 168 hours.  Cardiac Enzymes: No results for input(s): CKTOTAL, CKMB, CKMBINDEX, TROPONINI in the last 168 hours.  HbA1C: No results found for: HGBA1C  CBG: No results for input(s): GLUCAP in the last 168 hours.  Review of Systems:   UTA  Past Medical History:  She,  has a past medical history of Anemia, Arthritis, Atrial fibrillation (HCC), Bradycardia, Bronchitis, Chest pain (06/10/2014), Coronary artery disease, Dizziness, Fibromyalgia, Fibrosis of left knee joint, GERD (gastroesophageal reflux disease), H/O hiatal hernia, Hearing loss, History of uterine cancer (1975), Hyperlipidemia, Hypertension, Numbness and tingling, Pneumonia (yrs ago), Shortness of breath dyspnea, Syncope (07/16/2018), Tinnitus, Urge urinary incontinence, Urinary incontinence, and Vitamin D  deficiency.   Surgical History:   Past Surgical History:  Procedure Laterality Date   CERVICAL FUSION  2011   CHOLECYSTECTOMY N/A 06/14/2014   Procedure: LAPAROSCOPIC CHOLECYSTECTOMY WITH attempted INTRAOPERATIVE CHOLANGIOGRAM;  Surgeon: Alm VEAR Angle, MD;  Location: [facility];  Service: General;  Laterality: N/A;   INSERTION OF MESH N/A 07/18/2015   Procedure: INSERTION OF MESH;  Surgeon: Alm Angle, MD;  Location: [facility];  Service: General;  Laterality: N/A;   KNEE CLOSED REDUCTION Left 09/21/2013   Procedure: CLOSED MANIPULATION LEFT KNEE;  Surgeon: Donnice JONETTA Car, MD;  Location: [facility];  Service: Orthopedics;  Laterality: Left;   TOTAL ABDOMINAL HYSTERECTOMY W/ BILATERAL SALPINGOOPHORECTOMY  1972   TOTAL KNEE ARTHROPLASTY Left 08/03/2013   Procedure: LEFT TOTAL KNEE ARTHROPLASTY;  Surgeon: Donnice JONETTA Car, MD;  Location: [facility];  Service: Orthopedics;  Laterality: Left;   tracheotomy      TYMPANOPLASTY Left    VENTRAL HERNIA REPAIR N/A 07/18/2015   Procedure: LAPAROSCOPIC VENTRAL AND UMBILICAL  HERNIA LYSIS OF ADHESIONS;  Surgeon: Alm Angle, MD;  Location: [facility];  Service: General;  Laterality: N/A;     Social History:   reports that she has never smoked. She has never used smokeless tobacco. She reports that she does not drink alcohol and does not use drugs.   Family History:  Her family history includes  Stroke in her father and mother.   Allergies Allergies  Allergen Reactions   Duloxetine Hcl Other (See Comments)    jumping legs   Cefuroxime Rash   Codeine Nausea And Vomiting   Zofran  [Ondansetron  Hcl] Rash     Home Medications  Prior to Admission medications   Medication Sig Start Date End Date Taking? Authorizing Provider  acetaminophen  (TYLENOL ) 325 MG tablet Take 650 mg by mouth every 6 (six) hours as needed for mild pain.   Yes [provider]  amLODipine  (NORVASC ) 10 MG tablet Take 1 tablet (10 mg total) by mouth daily. 08/21/22  Yes Patwardhan, Manish J, MD  cetirizine (ZYRTEC) 10 MG tablet Take 10 mg by mouth daily as needed for rhinitis.   Yes [provider]  cholecalciferol  (VITAMIN D ) 1000 units tablet Take 2,000 Units by mouth daily.    Yes [provider]  diltiazem  (CARDIZEM ) 30 MG tablet TAKE 1 TABLET BY MOUTH 3  TIMES DAILY AS NEEDED Patient taking differently: Take 30 mg by mouth 3 (three) times daily as needed (increased heart rate). 11/13/21  Yes Patwardhan, Manish J, MD  ELIQUIS  5 MG TABS tablet TAKE 1 TABLET BY MOUTH TWICE  DAILY 11/12/22  Yes Patwardhan, Manish J, MD  furosemide  (LASIX ) 20 MG tablet Take 1 tablet (20 mg total) by mouth daily. Patient taking differently: Take 10 mg by mouth daily. 06/20/20  Yes Patwardhan, Manish J, MD  metoprolol  succinate (TOPROL -XL) 50 MG 24 hr tablet TAKE 1 TABLET BY MOUTH  DAILY WITH OR IMMEDIATELY  FOLLOWING A MEAL Patient taking differently: Take 50 mg by mouth daily.  04/18/20  Yes Patwardhan, Manish J, MD  pantoprazole  (PROTONIX ) 40 MG tablet Take 1 tablet twice daily for 1 week and then 1 tablet once daily Patient taking differently: Take 40 mg by mouth daily as needed (for heartburn). 02/14/23  Yes Verdene Purchase, MD  potassium chloride  (KLOR-CON ) 8 MEQ tablet Take 8 mEq by mouth daily.  04/17/15  Yes [provider]  QUEtiapine  (SEROQUEL ) 50 MG tablet Take 50 mg by mouth at bedtime. 04/30/24  Yes [provider]  rosuvastatin  (CRESTOR ) 20 MG tablet TAKE 1 TABLET BY MOUTH DAILY 08/19/22  Yes Patwardhan, Manish J, MD  senna-docusate (SENOKOT-S) 8.6-50 MG tablet Take 1 tablet by mouth 2 (two) times daily. Patient taking differently: Take 1 tablet by mouth daily as needed for mild constipation. 02/14/23  Yes Krishnan, Gokul, MD  traMADol  (ULTRAM ) 50 MG tablet 1 to 2 tablets every 6 hours as needed with extra strength Tylenol . 06/05/24  Yes Armenta Canning, MD     Romualdo Lerner S-NP

## 2024-06-16 NOTE — IPAL (Signed)
  Interdisciplinary Goals of Care Family Meeting   Date carried out: 06/16/2024  Location of the meeting: Bedside  Member's involved: Nurse Practitioner and Family Member or next of kin  Durable Power of Attorney or acting medical decision maker: husband Phyllis Knight  Discussion: We discussed goals of care for Phyllis Knight .  GOC. Discussed about 1 yr h/o being more sedentary. Baseline able to get around house. Up to chair and back. Not outside. Some degree of cognitive decline over last year as well. We discussed ramifications of CPR in this setting. Agreed at this point best approach would be to proceed w/ aggressive care but should she arrest she would be DNR  Code status:   Code Status: Do not attempt resuscitation (DNR) PRE-ARREST INTERVENTIONS DESIRED   Disposition: Continue current acute care  Time spent for the meeting: 22    Phyllis FORBES Banner, NP  06/16/2024, 5:35 PM

## 2024-06-16 NOTE — Progress Notes (Signed)
 06/16/2024 Full attestation tomorrow, seen and examined with Jeralyn Banner. Seems like urosepsis + septic cardiomyopathy. Bedside echo does not look great, ++JVD,  but pulses are okay which is odd Abx, CVL, coox, CVP, amio; may benefit from some inotropic support  Rolan Sharps MD PCCM

## 2024-06-16 NOTE — Progress Notes (Addendum)
 83 year old female lives w/ family. Fairly sedentary over last year w/ family noting functional and some degree of cognitive decline as well as increased swelling of her lower extremities, as well as some difficulty with atrial fib managed by her cardiologist. At baseline able to get up to chair. Walk short distance. Oriented and able to carry out meaningful conversations. Over last week husband noted that she has been becoming more confused. Had couple episodes of both bowel and bladder incontinence and over the evening and into 7/16 this confusion worsened to point EMS was called.   On EMS arrival noted foul smelling urine. Was confused. HR 140-160 af w/ RVR. Also noted bruising to her right face and periorbital area which she sustained from a fall on 7/5 (workup from this was neg for traumatic bleed).   In ER  Wbc ct 3.5, BNP 3064, trop 160-->243, venous blood cag 7.31/37.5/147/20 lactic acid 7.8-->6.4. bicarb 16, ast and alt elevated slightly.  Cultures sent. Received 1 liter crystalloid. Antibiotics started. POCUS w/ gross decreased LV fxn and distended IVC. HR post volume 120s af.  Amiodarone  bolus ordered. Admitted to ICU   PMH  Af, HTN, mixed HLD, dementia. Last ECHO 2020 EF 60-65%  Exam  General awake. Confused. Oriented X 1-2 not in distress HENT NCAT MMM poor dentition Pulm dec bases. No accessory use on [place] support  Card irreg irreg w/ POCUS c/w depressed LVEF Ext 4 + pitting edema. Weeping from LEs w/ small less than dime size venous ulcers noted on posterior RLE as well as one on left. They do not appear infected but are weeping clear fluid. Temp warm, pulses palp GU incont Impression/plan  Mixed septic and cardiogenic shock (SCAI stage B)w/ lactic acidosis.  Septic component seems Urinary tract in source. Cardiac component w/ unclear chronicity but hx suggests may be an acute on chronic problem, although septic CM on ddx  Plan Admit to ICU Control HR KVO IVFs for now  Hold home  antihypertensives Central access get CVP and SCVO2 Cultures sent Strart vanc and zosyn  Send cort, get formal ECHO If MAP stays > 65 will consider low dose dobut. If scvo2 low start low dose dobut Ck serial lactates and trend BNP Sent procal   Acute hypoxic resp failure w/ pulmonary edema on cxr Plan Supplemental o2 Hold further fluid resusc Pulse ox  Am cxr   Af w/ RVR Plan Amiodarone  load Cont tele  Cont DOAC  Elevated trop Plan Trend  Echo   Acute metabolic encephalopathy superimposed on dementia  Plan Treat sepsis  Supportive care Hold sedating drugs   Mild thrombocytopenia ? Sepsis related?  Plan Trend    Best Practice (right click and Reselect all SmartList Selections daily)   Diet/type: NPO w/ oral meds DVT prophylaxis: DOAC apixaban  (ELIQUIS ) tablet 5 mg   Pressure ulcers present: pressure ulcer assessment deferred  GI prophylaxis: PPI Lines: Central line Foley:  N/A Code Status:  limited Last date of multidisciplinary goals of care discussion [per ipal ]

## 2024-06-16 NOTE — ED Provider Notes (Addendum)
 [place] EMERGENCY DEPARTMENT AT [facility] Provider Note   CSN: [id number] Arrival date & time: 06/16/24  1250     Patient presents with: Shortness of Breath   Phyllis Knight is a 83 y.o. female.   Patient brought in by [facility] for shortness of breath that supposedly started this morning.  Increasingly worse throughout the day.  Patient also report reports painful urination for the past 3 days.  EMS reported that the house smelled like urine.  Patient arrived to the ED on BiPAP.  And then was switched over to our BiPAP.  Patient was seen July 5 following a fall patient is on Eliquis .  Patient did have a closed orbital fracture based on that workup.  Patient is showing a lot of bruising to the right side of her face.  Chart review seems to imply some degree of dementia.  Patient known to have fibromyalgia history of uterine cancer and 29 with hysterectomy atrial fibrillation reason why patient is on Eliquis .  Patient also on Toprol  XL Protonix  Seroquel  cardia exam and patient on Eliquis .  They reported that oxygen sats were 88% at home.  Not sure why they went with the BiPAP.  Do not know if patient is on oxygen at home.       Prior to Admission medications   Medication Sig Start Date End Date Taking? Authorizing Provider  acetaminophen  (TYLENOL ) 325 MG tablet Take 650 mg by mouth every 6 (six) hours as needed for mild pain.    [provider]  amLODipine  (NORVASC ) 10 MG tablet Take 1 tablet (10 mg total) by mouth daily. 08/21/22   Patwardhan, Newman PARAS, MD  cetirizine (ZYRTEC) 10 MG tablet Take 10 mg by mouth daily as needed for rhinitis.    [provider]  cholecalciferol  (VITAMIN D ) 1000 units tablet Take 2,000 Units by mouth daily.     [provider]  diltiazem  (CARDIZEM ) 30 MG tablet TAKE 1 TABLET BY MOUTH 3  TIMES DAILY AS NEEDED Patient taking differently: Take 30 mg by mouth 3 (three) times daily as needed (increased heart rate).  11/13/21   Patwardhan, Newman PARAS, MD  ELIQUIS  5 MG TABS tablet TAKE 1 TABLET BY MOUTH TWICE  DAILY 11/12/22   Patwardhan, Manish J, MD  furosemide  (LASIX ) 20 MG tablet Take 1 tablet (20 mg total) by mouth daily. Patient taking differently: Take 10 mg by mouth daily. 06/20/20   Patwardhan, Newman PARAS, MD  metoprolol  succinate (TOPROL -XL) 50 MG 24 hr tablet TAKE 1 TABLET BY MOUTH  DAILY WITH OR IMMEDIATELY  FOLLOWING A MEAL Patient taking differently: Take 50 mg by mouth daily. 04/18/20   Patwardhan, Newman PARAS, MD  pantoprazole  (PROTONIX ) 40 MG tablet Take 1 tablet twice daily for 1 week and then 1 tablet once daily Patient taking differently: Take 40 mg by mouth daily as needed (for heartburn). 02/14/23   Krishnan, Gokul, MD  potassium chloride  (KLOR-CON ) 8 MEQ tablet Take 8 mEq by mouth daily.  04/17/15   [provider]  QUEtiapine  (SEROQUEL ) 50 MG tablet Take 50 mg by mouth at bedtime. 04/30/24   [provider]  rosuvastatin  (CRESTOR ) 20 MG tablet TAKE 1 TABLET BY MOUTH DAILY 08/19/22   Patwardhan, Manish J, MD  senna-docusate (SENOKOT-S) 8.6-50 MG tablet Take 1 tablet by mouth 2 (two) times daily. Patient taking differently: Take 1 tablet by mouth daily as needed for mild constipation. 02/14/23   Krishnan, Gokul, MD  traMADol  (ULTRAM ) 50 MG tablet  1 to 2 tablets every 6 hours as needed with extra strength Tylenol . 06/05/24   Armenta Canning, MD    Allergies: Duloxetine hcl, Cefuroxime, Codeine, and Zofran  [ondansetron  hcl]    Review of Systems  Unable to perform ROS: Dementia    Updated Vital Signs BP (!) 151/114 (BP Location: Left Arm)   Pulse (!) 154   Temp (!) 103.6 F (39.8 C) (Rectal)   Resp (!) 34   SpO2 100%   Physical Exam Vitals and nursing note reviewed.  Constitutional:      General: She is not in acute distress.    Appearance: Normal appearance. She is well-developed.  HENT:     Head: Normocephalic.     Comments: Bruising to the right side of face orbital  area.  Appears old. Eyes:     Extraocular Movements: Extraocular movements intact.     Conjunctiva/sclera: Conjunctivae normal.     Pupils: Pupils are equal, round, and reactive to light.  Cardiovascular:     Rate and Rhythm: Normal rate and regular rhythm.     Heart sounds: No murmur heard. Pulmonary:     Effort: Pulmonary effort is normal. No respiratory distress.     Breath sounds: Normal breath sounds.  Abdominal:     Palpations: Abdomen is soft.     Tenderness: There is no abdominal tenderness.  Musculoskeletal:        General: No swelling.     Cervical back: Neck supple.  Skin:    General: Skin is warm and dry.     Capillary Refill: Capillary refill takes less than 2 seconds.  Neurological:     Mental Status: She is alert. Mental status is at baseline.     Comments: Patient awake and stating that she is feeling better but the seems to be some degree of confusion.  Psychiatric:        Mood and Affect: Mood normal.     (all labs ordered are listed, but only abnormal results are displayed) Labs Reviewed  RESP PANEL BY RT-PCR (RSV, FLU A&B, COVID)  [id number]  CULTURE, BLOOD (ROUTINE X 2)  CULTURE, BLOOD (ROUTINE X 2)  CBC WITH DIFFERENTIAL/PLATELET  COMPREHENSIVE METABOLIC PANEL WITH GFR  BRAIN NATRIURETIC PEPTIDE  PROTIME-INR  URINALYSIS, W/ REFLEX TO CULTURE (INFECTION SUSPECTED)  I-STAT VENOUS BLOOD GAS, ED  I-STAT CG4 LACTIC ACID, ED  TROPONIN I (HIGH SENSITIVITY)    EKG: EKG Interpretation Date/Time:  Wednesday June 16 2024 12:42:28 EDT Ventricular Rate:  142 PR Interval:    QRS Duration:  90 QT Interval:  304 QTC Calculation: 468 R Axis:   -32  Text Interpretation: Atrial fibrillation with rapid V-rate Borderline low voltage, extremity leads Probable LVH with secondary repol abnrm Confirmed by Avarose Mervine [phone]) on 06/16/2024 1:10:10 PM  Radiology: No results found.   Procedures   Medications Ordered in the ED  lactated ringers  infusion (has no  administration in time range)  lactated ringers  bolus 1,000 mL (has no administration in time range)    And  lactated ringers  bolus 1,000 mL (has no administration in time range)    And  lactated ringers  bolus 250 mL (has no administration in time range)  aztreonam  (AZACTAM ) 2 g in sodium chloride  0.9 % 100 mL IVPB (has no administration in time range)  metroNIDAZOLE  (FLAGYL ) IVPB 500 mg (has no administration in time range)  vancomycin  (VANCOCIN ) IVPB 1000 mg/200 mL premix (has no administration in time range)  Medical Decision Making Amount and/or Complexity of Data Reviewed Labs: ordered. Radiology: ordered.  Risk OTC drugs. Prescription drug management.   Patient arriving here on BiPAP.  Will get venous blood gas will get labs x-ray and will repeat her head CT since there seems to be some degree of confusion which I think may be baseline.  Patient with temp here of 103.  Will start sepsis protocol.  Also EKG shows atrial fibrillation with a heart rate of 142.  Patient is on a blood thinner.  Will go ahead and do the sepsis protocol and fluids first.  Could be secondary to the sepsis.  Will order some Tylenol  for her.  CRITICAL CARE Performed by: Leonard Feigel Total critical care time: 45 minutes Critical care time was exclusive of separately billable procedures and treating other patients. Critical care was necessary to treat or prevent imminent or life-threatening deterioration. Critical care was time spent personally by me on the following activities: development of treatment plan with patient and/or surrogate as well as nursing, discussions with consultants, evaluation of patient's response to treatment, examination of patient, obtaining history from patient or surrogate, ordering and performing treatments and interventions, ordering and review of laboratory studies, ordering and review of radiographic studies, pulse oximetry and  re-evaluation of patient's condition.  Urine is a potential source.  But will go with broad-spectrum antibiotics.  Patient has an allergy to cefuroxime.  Patient's white blood cell count 3.5 hemoglobin 12.2 platelets 119.  Based on the white count being low we will go ahead and check respiratory panel.  BNP is markedly elevated at 3000.  Initial troponin was 160.  Portable chest x-ray no significant interval change make no mention about congestive heart failure.  Head CT pending lactic acid pending and as mentioned respiratory panel pending.  Lactic acid was 7.8.  This raising concerns for sepsis patient was treated with broad-spectrum antibiotics.  White count was not elevated.  BNP was quite elevated as mentioned above but the chest x-ray did not show pulmonary edema.  But working to hold fluids because her blood pressure has been good.  There was some widening of the mediastinum so based on this she is going to get CT dissection study we decided we were going to CT her abdomen and pelvis anyways to try to figure out a source urine is still pending.  Patient's initial troponin was 160.  And delta troponin is pending and.  She never got her venous blood gas.  Correction venous blood gases back.  pH was 7.313 pCO2 37.  Patient will be turned over to Dr. Gerlean evening emergency physician.   Final diagnoses:  SOB (shortness of breath)  Sepsis, due to unspecified organism, unspecified whether acute organ dysfunction present [facility])    ED Discharge Orders     None          Geraldene Hamilton, MD 06/16/24 1303    Geraldene Hamilton, MD 06/16/24 1311    Geraldene Hamilton, MD 06/16/24 1425    Geraldene Hamilton, MD 06/16/24 1510    Geraldene Hamilton, MD 06/16/24 1512

## 2024-06-16 NOTE — Progress Notes (Incomplete Revision)
 83 year old female lives w/ family. Fairly sedentary over last year w/ family noting functional and some degree of cognitive decline as well as increased swelling of her lower extremities, as well as some difficulty with atrial fib managed by her cardiologist. At baseline able to get up to chair. Walk short distance. Oriented and able to carry out meaningful conversations. Over last week husband noted that she has been becoming more confused. Had couple episodes of both bowel and bladder incontinence and over the evening and into 7/16 this confusion worsened to point EMS was called.   On EMS arrival noted foul smelling urine. Was confused. HR 140-160 af w/ RVR. Also noted bruising to her right face and periorbital area which she sustained from a fall on 7/5 (workup from this was neg for traumatic bleed).   In ER  Wbc ct 3.5, BNP 3064, trop 160-->243, venous blood cag 7.31/37.5/147/20 lactic acid 7.8-->6.4. bicarb 16, ast and alt elevated slightly.  Cultures sent. Received 1 liter crystalloid. Antibiotics started. POCUS w/ gross decreased LV fxn and distended IVC. HR post volume 120s af.  Amiodarone  bolus ordered. Admitted to ICU   PMH  Af, HTN, mixed HLD, dementia. Last ECHO 2020 EF 60-65%  Exam  General awake. Confused. Oriented X 1-2 not in distress HENT NCAT MMM poor dentition Pulm dec bases. No accessory use on [place] support  Card irreg irreg w/ POCUS c/w depressed LVEF Ext 4 + pitting edema. Weeping from LEs w/ small less than dime size venous ulcers noted on posterior RLE as well as one on left. They do not appear infected but are weeping clear fluid. Temp warm, pulses palp GU incont Impression/plan  Mixed septic and cardiogenic shock (SCAI stage B)w/ lactic acidosis.  Septic component seems Urinary tract in source. Cardiac component w/ unclear chronicity but hx suggests may be an acute on chronic problem, although septic CM on ddx  Plan Admit to ICU Control HR KVO IVFs for now  Hold home  antihypertensives Central access get CVP and SCVO2 Cultures sent Strart vanc and zosyn  Send cort, get formal ECHO If MAP stays > 65 will consider low dose dobut. If scvo2 low start low dose dobut Ck serial lactates and trend BNP Sent procal   Acute hypoxic resp failure w/ pulmonary edema on cxr Plan Supplemental o2 Hold further fluid resusc Pulse ox  Am cxr   Af w/ RVR Plan Amiodarone  load Cont tele  Cont DOAC  Elevated trop Plan Trend  Echo   Acute metabolic encephalopathy superimposed on dementia  Plan Treat sepsis  Supportive care Hold sedating drugs   Mild thrombocytopenia ? Sepsis related?  Plan Trend    Best Practice (right click and Reselect all SmartList Selections daily)   Diet/type: NPO w/ oral meds DVT prophylaxis: DOAC apixaban  (ELIQUIS ) tablet 5 mg   Pressure ulcers present: pressure ulcer assessment deferred  GI prophylaxis: PPI Lines: Central line Foley:  N/A Code Status:  limited Last date of multidisciplinary goals of care discussion [per ipal ]

## 2024-06-16 NOTE — Progress Notes (Signed)
 Pt arrived on unit at shift change and seen by CCM and lined. Pt vitals w/in order set and stable during report

## 2024-06-17 ENCOUNTER — Inpatient Hospital Stay (HOSPITAL_COMMUNITY)

## 2024-06-17 DIAGNOSIS — I4891 Unspecified atrial fibrillation: Secondary | ICD-10-CM | POA: Diagnosis not present

## 2024-06-17 DIAGNOSIS — E43 Unspecified severe protein-calorie malnutrition: Secondary | ICD-10-CM | POA: Diagnosis not present

## 2024-06-17 DIAGNOSIS — R570 Cardiogenic shock: Secondary | ICD-10-CM | POA: Diagnosis not present

## 2024-06-17 LAB — COMPREHENSIVE METABOLIC PANEL WITH GFR
ALT: 87 U/L — ABNORMAL HIGH (ref 0–44)
AST: 176 U/L — ABNORMAL HIGH (ref 15–41)
Albumin: 3 g/dL — ABNORMAL LOW (ref 3.5–5.0)
Alkaline Phosphatase: 50 U/L (ref 38–126)
Anion gap: 13 (ref 5–15)
BUN: 15 mg/dL (ref 8–23)
CO2: 21 mmol/L — ABNORMAL LOW (ref 22–32)
Calcium: 8.7 mg/dL — ABNORMAL LOW (ref 8.9–10.3)
Chloride: 102 mmol/L (ref 98–111)
Creatinine, Ser: 0.95 mg/dL (ref 0.44–1.00)
GFR, Estimated: 59 mL/min — ABNORMAL LOW
Glucose, Bld: 173 mg/dL — ABNORMAL HIGH (ref 70–99)
Potassium: 3.5 mmol/L (ref 3.5–5.1)
Sodium: 136 mmol/L (ref 135–145)
Total Bilirubin: 1.5 mg/dL — ABNORMAL HIGH (ref 0.0–1.2)
Total Protein: 7.6 g/dL (ref 6.5–8.1)

## 2024-06-17 LAB — ECHOCARDIOGRAM COMPLETE
AR max vel: 1.59 cm2
AV Area VTI: 1.49 cm2
AV Area mean vel: 1.57 cm2
AV Mean grad: 3.7 mmHg
AV Peak grad: 6.7 mmHg
Ao pk vel: 1.3 m/s
Calc EF: 33.8 %
S' Lateral: 4.3 cm
Single Plane A2C EF: 40.5 %
Single Plane A4C EF: 25.2 %
Weight: 3015.89 [oz_av]

## 2024-06-17 LAB — PHOSPHORUS: Phosphorus: 3.5 mg/dL (ref 2.5–4.6)

## 2024-06-17 LAB — RESPIRATORY PANEL BY PCR

## 2024-06-17 LAB — CBC
HCT: 34.1 % — ABNORMAL LOW (ref 36.0–46.0)
Hemoglobin: 11 g/dL — ABNORMAL LOW (ref 12.0–15.0)
MCH: 31.7 pg (ref 26.0–34.0)
MCHC: 32.3 g/dL (ref 30.0–36.0)
MCV: 98.3 fL (ref 80.0–100.0)
Platelets: 100 10*3/uL — ABNORMAL LOW (ref 150–400)
RBC: 3.47 MIL/uL — ABNORMAL LOW (ref 3.87–5.11)
RDW: 14.8 % (ref 11.5–15.5)
WBC: 5 10*3/uL (ref 4.0–10.5)
nRBC: 0 % (ref 0.0–0.2)

## 2024-06-17 LAB — GLUCOSE, CAPILLARY
Glucose-Capillary: 142 mg/dL — ABNORMAL HIGH (ref 70–99)
Glucose-Capillary: 132 mg/dL — ABNORMAL HIGH (ref 70–99)
Glucose-Capillary: 134 mg/dL — ABNORMAL HIGH (ref 70–99)
Glucose-Capillary: 107 mg/dL — ABNORMAL HIGH (ref 70–99)
Glucose-Capillary: 95 mg/dL (ref 70–99)
Glucose-Capillary: 94 mg/dL (ref 70–99)

## 2024-06-17 LAB — PROCALCITONIN: Procalcitonin: 3.38 ng/mL

## 2024-06-17 LAB — BRAIN NATRIURETIC PEPTIDE: B Natriuretic Peptide: 3334.3 pg/mL — ABNORMAL HIGH (ref 0.0–100.0)

## 2024-06-17 LAB — RAPID URINE DRUG SCREEN, HOSP PERFORMED
Amphetamines: NOT DETECTED
Barbiturates: NOT DETECTED
Benzodiazepines: NOT DETECTED
Cocaine: NOT DETECTED
Opiates: NOT DETECTED
Tetrahydrocannabinol: NOT DETECTED

## 2024-06-17 LAB — MAGNESIUM: Magnesium: 1.6 mg/dL — ABNORMAL LOW (ref 1.7–2.4)

## 2024-06-17 LAB — LACTIC ACID, PLASMA: Lactic Acid, Venous: 2 mmol/L (ref 0.5–1.9)

## 2024-06-17 MED ORDER — GUAIFENESIN ER 600 MG PO TB12
600.0000 mg | ORAL_TABLET | Freq: Two times a day (BID) | ORAL | Status: DC
  Administered 2024-06-17 – 2024-06-25 (×17): 600 mg via ORAL
  Filled 2024-06-17 (×18): qty 1

## 2024-06-17 MED ORDER — PERFLUTREN LIPID MICROSPHERE
1.0000 mL | INTRAVENOUS | Status: AC | PRN
  Administered 2024-06-17: 2 mL via INTRAVENOUS

## 2024-06-17 MED ORDER — CHLORHEXIDINE GLUCONATE CLOTH 2 % EX PADS
6.0000 | MEDICATED_PAD | Freq: Every day | CUTANEOUS | Status: DC
  Administered 2024-06-17 – 2024-06-25 (×8): 6 via TOPICAL

## 2024-06-17 MED ORDER — AMIODARONE IV BOLUS ONLY 150 MG/100ML
150.0000 mg | Freq: Once | INTRAVENOUS | Status: AC
  Administered 2024-06-17: 150 mg via INTRAVENOUS

## 2024-06-17 MED ORDER — POTASSIUM CHLORIDE 10 MEQ/50ML IV SOLN
10.0000 meq | INTRAVENOUS | Status: AC
  Administered 2024-06-17 (×4): 10 meq via INTRAVENOUS
  Filled 2024-06-17 (×3): qty 50

## 2024-06-17 MED ORDER — MAGNESIUM SULFATE 4 GM/100ML IV SOLN
4.0000 g | Freq: Once | INTRAVENOUS | Status: AC
  Administered 2024-06-17: 4 g via INTRAVENOUS
  Filled 2024-06-17: qty 100

## 2024-06-17 MED ORDER — METOPROLOL SUCCINATE ER 50 MG PO TB24
50.0000 mg | ORAL_TABLET | Freq: Every day | ORAL | Status: DC
  Administered 2024-06-17 – 2024-06-18 (×2): 50 mg via ORAL
  Filled 2024-06-17 (×2): qty 1

## 2024-06-17 MED ORDER — FUROSEMIDE 10 MG/ML IJ SOLN
20.0000 mg | Freq: Once | INTRAMUSCULAR | Status: AC
  Administered 2024-06-17: 20 mg via INTRAVENOUS
  Filled 2024-06-17: qty 2

## 2024-06-17 MED ORDER — ORAL CARE MOUTH RINSE: 15.0000 mL | OROMUCOSAL | Status: DC | PRN

## 2024-06-17 MED ORDER — ORAL CARE MOUTH RINSE: 15.0000 mL | OROMUCOSAL | Status: DC

## 2024-06-17 NOTE — Progress Notes (Signed)
 NAME:  Phyllis Knight, MRN:  [id number], DOB:  12-06-1940, LOS: 1 ADMISSION DATE:  06/16/2024,   History of Present Illness:  83yo F presented with SOB, increased confusion, and dysuria x3 days. Was seen 7/5 for fall on Eliquis , found to have closed orbital fracture.  Reported decreased functional ability over the last year. Increased confusion and incontinence over the last week which is new for her per family. Pt was placed on bipap by EMS due to oxygen saturation 88%, bipap continued in the ED. Found to be febrile to 103 and in afib with RVR. Sepsis protocol initiated, broad spectrum antibiotics started. Lactate 8. U/A negative for UTI. BNP found to be elevated at 3000, initial troponin 160. Pt became increasingly SOB after fluid resuscitation.   Pertinent  Medical History  Dementia, fibromyalgia, hx uterine cancer, afib on Eliquis , GERD, HLD  Significant Hospital Events: Including procedures, antibiotic start and stop dates in addition to other pertinent events   7/16 admitted to ICU, Vancomycin  and Zosyn  started  Interim History / Subjective:  NAEON. States she wants to go home. Denies pain.  Objective    Blood pressure (!) 134/99, pulse (!) 104, temperature (!) 97.4 F (36.3 C), temperature source Oral, resp. rate (!) 22, weight 85.5 kg, SpO2 98%. CVP:  [8 mmHg-16 mmHg] 8 mmHg  FiO2 (%):  [50 %] 50 % PEEP:  [5 cmH20] 5 cmH20 Pressure Support:  [5 cmH20] 5 cmH20   Intake/Output Summary (Last 24 hours) at 06/17/2024 0934 Last data filed at 06/17/2024 0800 Gross per 24 hour  Intake 2299.05 ml  Output 250 ml  Net 2049.05 ml   Filed Weights   06/16/24 1827 06/17/24 0500  Weight: 84.4 kg 85.5 kg    Examination: General: Chronically ill appearing, alert HENT: Periorbital ecchymosis on the right from recent fall, EOMI Lungs: Coarse breath sounds, scattered crackles throughout Cardiovascular: in afib in low 100s, 1+ pitting edema BLE Abdomen: bowel sounds present, TTP  epigastric region Neuro: follows commands, oriented to self and place  Resolved problem list   Assessment and Plan  Mixed septic and cardiogenic shock with lactic acidosis No obvious source of infection. Procal 3.38. Bedside POCUS with reduced EF and distended IVC Urine culture pending Continue Zosyn , remove Vancomycin . MRSA pcr negative Follow blood, urine cultures IV Lasix  20mg  x1 Follow up echo Trend CVP  Atrial fibrillation Amiodarone  gtt Continue home Eliquis  5mg  BID Holding home dilitazem  Acute hypoxic respiratory failure Stable on 3L [place] Wean as tolerated Goal >92%  Acute encephalopathy Has hx dementia, she is oriented to self and situation. CT head unremarkable in ED Delirium precautions   Electrolytes Replenishing magnesium , albumin   Best Practice (right click and Reselect all SmartList Selections daily)   Diet/type: Regular consistency (see orders) DVT prophylaxis DOAC Pressure ulcer(s): N/A GI prophylaxis: PPI Lines: Central line Foley:  N/A Code Status:  DNR Last date of multidisciplinary goals of care discussion []   Labs   CBC: Recent Labs  Lab 06/16/24 1255 06/16/24 1444 06/16/24 1517 06/16/24 2021 06/17/24 0420  WBC 3.5*  --   --   --  5.0  NEUTROABS 2.6  --   --   --   --   HGB 12.2 12.9 12.6  --  11.0*  HCT 38.0 38.0 37.0  --  34.1*  MCV 100.0  --   --   --  98.3  PLT 119*  --   --  112* 100*    Basic Metabolic Panel: Recent Labs  Lab 06/16/24 1444 06/16/24 1506 06/16/24 1517 06/17/24 0420  NA 136 136 137 136  K 5.2* 3.8 4.0 3.5  CL  --  105  --  102  CO2  --  16*  --  21*  GLUCOSE  --  120*  --  173*  BUN  --  14  --  15  CREATININE  --  0.97  --  0.95  CALCIUM   --  8.5*  --  8.7*  MG  --   --   --  1.6*  PHOS  --   --   --  3.5   GFR: Estimated Creatinine Clearance: 52.3 mL/min (by C-G formula based on SCr of 0.95 mg/dL). Recent Labs  Lab 06/16/24 1255 06/16/24 1445 06/16/24 1514 06/16/24 2021 06/17/24 0420   PROCALCITON  --   --   --  2.89 3.38  WBC 3.5*  --   --   --  5.0  LATICACIDVEN  --  7.8* 6.4* 3.9* 2.0*    Liver Function Tests: Recent Labs  Lab 06/16/24 1506 06/17/24 0420  AST 250* 176*  ALT 103* 87*  ALKPHOS 70 50  BILITOT 2.1* 1.5*  PROT 7.3 7.6  ALBUMIN  3.0* 3.0*   No results for input(s): LIPASE, AMYLASE in the last 168 hours. No results for input(s): AMMONIA in the last 168 hours.  ABG    Component Value Date/Time   HCO3 17.5 (L) 06/16/2024 1517   TCO2 18 (L) 06/16/2024 1517   ACIDBASEDEF 7.0 (H) 06/16/2024 1517   O2SAT 63.3 06/16/2024 2036     Coagulation Profile: Recent Labs  Lab 06/16/24 1305 06/16/24 2021  INR 1.8* 1.8*    Cardiac Enzymes: Recent Labs  Lab 06/16/24 2021  CKTOTAL 134    HbA1C: No results found for: HGBA1C  CBG: Recent Labs  Lab 06/16/24 1811 06/17/24 0752  GLUCAP 142* 132*    Review of Systems:   Limited 2/2 dementia  Past Medical History:  She,  has a past medical history of Anemia, Arthritis, Atrial fibrillation (HCC), Bradycardia, Bronchitis, Chest pain (06/10/2014), Coronary artery disease, Dizziness, Fibromyalgia, Fibrosis of left knee joint, GERD (gastroesophageal reflux disease), H/O hiatal hernia, Hearing loss, History of uterine cancer (1975), Hyperlipidemia, Hypertension, Numbness and tingling, Pneumonia (yrs ago), Shortness of breath dyspnea, Syncope (07/16/2018), Tinnitus, Urge urinary incontinence, Urinary incontinence, and Vitamin D  deficiency.   Surgical History:   Past Surgical History:  Procedure Laterality Date   CERVICAL FUSION  2011   CHOLECYSTECTOMY N/A 06/14/2014   Procedure: LAPAROSCOPIC CHOLECYSTECTOMY WITH attempted INTRAOPERATIVE CHOLANGIOGRAM;  Surgeon: Alm VEAR Angle, MD;  Location: [facility];  Service: General;  Laterality: N/A;   INSERTION OF MESH N/A 07/18/2015   Procedure: INSERTION OF MESH;  Surgeon: Alm Angle, MD;  Location: [facility];  Service: General;  Laterality: N/A;   KNEE  CLOSED REDUCTION Left 09/21/2013   Procedure: CLOSED MANIPULATION LEFT KNEE;  Surgeon: Donnice JONETTA Car, MD;  [facility];  Service: Orthopedics;  Laterality: Left;   TOTAL ABDOMINAL HYSTERECTOMY W/ BILATERAL SALPINGOOPHORECTOMY  1972   TOTAL KNEE ARTHROPLASTY Left 08/03/2013   Procedure: LEFT TOTAL KNEE ARTHROPLASTY;  Surgeon: Donnice JONETTA Car, MD;  Location: [facility];  Service: Orthopedics;  Laterality: Left;   tracheotomy     TYMPANOPLASTY Left    VENTRAL HERNIA REPAIR N/A 07/18/2015   Procedure: LAPAROSCOPIC VENTRAL AND UMBILICAL  HERNIA LYSIS OF ADHESIONS;  Surgeon: Alm Angle, MD;  Location: [facility];  Service: General;  Laterality: N/A;  Social History:   reports that she has never smoked. She has never used smokeless tobacco. She reports that she does not drink alcohol and does not use drugs.   Family History:  Her family history includes Stroke in her father and mother.   Allergies Allergies  Allergen Reactions   Duloxetine Hcl Other (See Comments)    jumping legs   Cefuroxime Rash   Codeine Nausea And Vomiting   Zofran  [Ondansetron  Hcl] Rash     Home Medications  Prior to Admission medications   Medication Sig Start Date End Date Taking? Authorizing Provider  acetaminophen  (TYLENOL ) 325 MG tablet Take 650 mg by mouth every 6 (six) hours as needed for mild pain.   Yes [provider]  amLODipine  (NORVASC ) 10 MG tablet Take 1 tablet (10 mg total) by mouth daily. 08/21/22  Yes Patwardhan, Manish J, MD  cetirizine (ZYRTEC) 10 MG tablet Take 10 mg by mouth daily as needed for rhinitis.   Yes [provider]  cholecalciferol  (VITAMIN D ) 1000 units tablet Take 2,000 Units by mouth daily.    Yes [provider]  diltiazem  (CARDIZEM ) 30 MG tablet TAKE 1 TABLET BY MOUTH 3  TIMES DAILY AS NEEDED Patient taking differently: Take 30 mg by mouth 3 (three) times daily as needed (increased heart rate). 11/13/21  Yes Patwardhan, Manish J, MD   ELIQUIS  5 MG TABS tablet TAKE 1 TABLET BY MOUTH TWICE  DAILY 11/12/22  Yes Patwardhan, Manish J, MD  furosemide  (LASIX ) 20 MG tablet Take 1 tablet (20 mg total) by mouth daily. Patient taking differently: Take 10 mg by mouth daily. 06/20/20  Yes Patwardhan, Manish J, MD  metoprolol  succinate (TOPROL -XL) 50 MG 24 hr tablet TAKE 1 TABLET BY MOUTH  DAILY WITH OR IMMEDIATELY  FOLLOWING A MEAL Patient taking differently: Take 50 mg by mouth daily. 04/18/20  Yes Patwardhan, Manish J, MD  pantoprazole  (PROTONIX ) 40 MG tablet Take 1 tablet twice daily for 1 week and then 1 tablet once daily Patient taking differently: Take 40 mg by mouth daily as needed (for heartburn). 02/14/23  Yes Verdene Purchase, MD  potassium chloride  (KLOR-CON ) 8 MEQ tablet Take 8 mEq by mouth daily.  04/17/15  Yes [provider]  QUEtiapine  (SEROQUEL ) 50 MG tablet Take 50 mg by mouth at bedtime. 04/30/24  Yes [provider]  rosuvastatin  (CRESTOR ) 20 MG tablet TAKE 1 TABLET BY MOUTH DAILY 08/19/22  Yes Patwardhan, Manish J, MD  senna-docusate (SENOKOT-S) 8.6-50 MG tablet Take 1 tablet by mouth 2 (two) times daily. Patient taking differently: Take 1 tablet by mouth daily as needed for mild constipation. 02/14/23  Yes Krishnan, Gokul, MD  traMADol  (ULTRAM ) 50 MG tablet 1 to 2 tablets every 6 hours as needed with extra strength Tylenol . 06/05/24  Yes Armenta Canning, MD     Critical care time: 30 minutes

## 2024-06-17 NOTE — Progress Notes (Signed)
 eLink Physician-Brief Progress Note Patient Name: Phyllis Knight DOB: [birth year] MRN: [id number]   Date of Service  06/17/2024  HPI/Events of Note  83 Y/O with dementia, afin, recent fall with orbital fracture but presented with mixed septic/cardiogenic shock  Patient NPO but has been taking PO meds  eICU Interventions  Update diet order to include sips with meds     Intervention Category Minor Interventions: Routine modifications to care plan (e.g. PRN medications for pain, fever)  Kole Hilyard 06/17/2024, 9:09 PM

## 2024-06-17 NOTE — Progress Notes (Signed)
 [facility] ADULT ICU REPLACEMENT PROTOCOL   The patient does apply for the [facility] Adult ICU Electrolyte Replacment Protocol based on the criteria listed below:   1.Exclusion criteria: TCTS, ECMO, Dialysis, and Myasthenia Gravis patients 2. Is GFR >/= 30 ml/min? Yes.    Patient's GFR today is 59 3. Is SCr </= 2? Yes.   Patient's SCr is 0.95 mg/dL 4. Did SCr increase >/= 0.5 in 24 hours? No. 5.Pt's weight >40kg  Yes.   6. Abnormal electrolyte(s): K+ = 3.5, Mg = 1.6  7. Electrolytes replaced per protocol 8.  Call MD STAT for K+ </= 2.5, Phos </= 1, or Mag </= 1 Physician:  Haze, eMD   Rosina SAILOR Doryce Mcgregory 06/17/2024 6:05 AM

## 2024-06-17 NOTE — Progress Notes (Incomplete)
 Attending note: I have seen and examined the patient. History, labs and imaging reviewed.  83 Y/O with dementia, afin, recent fall with orbital fracture Admitted with sepsis present on admission   Blood pressure (!) 118/93, pulse (!) 110, temperature (!) 97.4 F (36.3 C), temperature source Oral, resp. rate 17, weight 85.5 kg, SpO2 98%. Gen:      No acute distress HEENT:  EOMI, sclera anicteric Neck:     No masses; no thyromegaly Lungs:    Clear to auscultation bilaterally; normal respiratory effort*** CV:         Regular rate and rhythm; no murmurs Abd:      + bowel sounds; soft, non-tender; no palpable masses, no distension Ext:    No edema; adequate peripheral perfusion Neuro: alert and oriented x 3 Psych: normal mood and affect   Labs/Imaging personally reviewed, significant for Significant for BUN/creatinine 15/0.95 AST 176, ALT 87, BNP 3334 Lactic acid 2.0, procalcitonin 2.38 WBC 5.0, hemoglobin 11.0, platelets 100  Assessment/plan: Mixed septic cardiogenic shock Sepsis present on admission, UTI Continue abx, Can dc vanco as MRSA PCR is negative. Follow cultures  Severe malnutrition, failure to thrive, present on admission Nutrition consult  Afib Amio drip, eliquis   The patient is critically ill with multiple organ systems failure and requires high complexity decision making for assessment and support, frequent evaluation and titration of therapies, application of advanced monitoring technologies and extensive interpretation of multiple databases.  Critical care time - 35 mins. This represents my time independent of the NPs time taking care of the pt.  Zymeir Salminen MD [facility] 06/17/2024, 7:57 AM

## 2024-06-18 DIAGNOSIS — I5021 Acute systolic (congestive) heart failure: Secondary | ICD-10-CM | POA: Diagnosis not present

## 2024-06-18 DIAGNOSIS — I509 Heart failure, unspecified: Secondary | ICD-10-CM

## 2024-06-18 DIAGNOSIS — J9601 Acute respiratory failure with hypoxia: Secondary | ICD-10-CM | POA: Diagnosis not present

## 2024-06-18 DIAGNOSIS — Z515 Encounter for palliative care: Secondary | ICD-10-CM

## 2024-06-18 DIAGNOSIS — I480 Paroxysmal atrial fibrillation: Secondary | ICD-10-CM

## 2024-06-18 DIAGNOSIS — R579 Shock, unspecified: Secondary | ICD-10-CM | POA: Diagnosis not present

## 2024-06-18 DIAGNOSIS — E43 Unspecified severe protein-calorie malnutrition: Secondary | ICD-10-CM | POA: Diagnosis not present

## 2024-06-18 DIAGNOSIS — I4891 Unspecified atrial fibrillation: Secondary | ICD-10-CM | POA: Diagnosis not present

## 2024-06-18 DIAGNOSIS — Z66 Do not resuscitate: Secondary | ICD-10-CM

## 2024-06-18 DIAGNOSIS — Z7189 Other specified counseling: Secondary | ICD-10-CM

## 2024-06-18 DIAGNOSIS — R570 Cardiogenic shock: Secondary | ICD-10-CM | POA: Diagnosis not present

## 2024-06-18 LAB — COMPREHENSIVE METABOLIC PANEL WITH GFR
ALT: 86 U/L — ABNORMAL HIGH (ref 0–44)
AST: 126 U/L — ABNORMAL HIGH (ref 15–41)
Albumin: 3.3 g/dL — ABNORMAL LOW (ref 3.5–5.0)
Alkaline Phosphatase: 48 U/L (ref 38–126)
Anion gap: 15 (ref 5–15)
BUN: 20 mg/dL (ref 8–23)
CO2: 21 mmol/L — ABNORMAL LOW (ref 22–32)
Calcium: 8.5 mg/dL — ABNORMAL LOW (ref 8.9–10.3)
Chloride: 102 mmol/L (ref 98–111)
Creatinine, Ser: 1.28 mg/dL — ABNORMAL HIGH (ref 0.44–1.00)
GFR, Estimated: 42 mL/min — ABNORMAL LOW (ref >60)
Glucose, Bld: 107 mg/dL — ABNORMAL HIGH (ref 70–99)
Potassium: 3.5 mmol/L (ref 3.5–5.1)
Sodium: 138 mmol/L (ref 135–145)
Total Bilirubin: 1.6 mg/dL — ABNORMAL HIGH (ref 0.0–1.2)
Total Protein: 7.8 g/dL (ref 6.5–8.1)

## 2024-06-18 LAB — CBC
HCT: 34.8 % — ABNORMAL LOW (ref 36.0–46.0)
Hemoglobin: 11.3 g/dL — ABNORMAL LOW (ref 12.0–15.0)
MCH: 31.9 pg (ref 26.0–34.0)
MCHC: 32.5 g/dL (ref 30.0–36.0)
MCV: 98.3 fL (ref 80.0–100.0)
Platelets: 104 K/uL — ABNORMAL LOW (ref 150–400)
RBC: 3.54 MIL/uL — ABNORMAL LOW (ref 3.87–5.11)
RDW: 14.9 % (ref 11.5–15.5)
WBC: 9.1 K/uL (ref 4.0–10.5)
nRBC: 0 % (ref 0.0–0.2)

## 2024-06-18 LAB — MAGNESIUM: Magnesium: 2.4 mg/dL (ref 1.7–2.4)

## 2024-06-18 LAB — URINE CULTURE: Culture: NO GROWTH

## 2024-06-18 LAB — GLUCOSE, CAPILLARY
Glucose-Capillary: 93 mg/dL (ref 70–99)
Glucose-Capillary: 79 mg/dL (ref 70–99)
Glucose-Capillary: 71 mg/dL (ref 70–99)
Glucose-Capillary: 82 mg/dL (ref 70–99)
Glucose-Capillary: 84 mg/dL (ref 70–99)
Glucose-Capillary: 106 mg/dL — ABNORMAL HIGH (ref 70–99)

## 2024-06-18 LAB — PHOSPHORUS: Phosphorus: 3.4 mg/dL (ref 2.5–4.6)

## 2024-06-18 LAB — PROCALCITONIN: Procalcitonin: 2.96 ng/mL

## 2024-06-18 MED ORDER — ENSURE PLUS HIGH PROTEIN PO LIQD
237.0000 mL | Freq: Two times a day (BID) | ORAL | Status: DC
  Administered 2024-06-19 – 2024-06-25 (×11): 237 mL via ORAL

## 2024-06-18 MED ORDER — POTASSIUM CHLORIDE CRYS ER 20 MEQ PO TBCR
40.0000 meq | EXTENDED_RELEASE_TABLET | Freq: Once | ORAL | Status: AC
  Administered 2024-06-18: 40 meq via ORAL
  Filled 2024-06-18: qty 2

## 2024-06-18 MED ORDER — OXIDIZED CELLULOSE EX PADS
1.0000 | MEDICATED_PAD | CUTANEOUS | Status: AC
  Filled 2024-06-18: qty 1

## 2024-06-18 MED ORDER — ADULT MULTIVITAMIN W/MINERALS CH
1.0000 | ORAL_TABLET | Freq: Every day | ORAL | Status: DC
  Administered 2024-06-18 – 2024-06-25 (×8): 1 via ORAL
  Filled 2024-06-18 (×8): qty 1

## 2024-06-18 NOTE — Consult Note (Signed)
 Consultation Note Date: 06/18/2024   Patient Name: Phyllis Knight  DOB: 18-Jul-1941  MRN: [id number]  Age / Sex: 83 y.o., female  PCP: Phyllis Pellet, MD Referring Physician: Mannam, Praveen, MD  Reason for Consultation: Establishing goals of care  HPI/Patient Profile: 83 y.o. female  with past medical history of dementia, fibromyalgia, hx uterine cancer, afib on Eliquis , GERD, and HLD admitted on 06/16/2024 with new onset shortness of breath.  Diagnosed with mixed septic and cardiogenic shock. Echo LVEF 25-30%, LV severely decreased function and global hypokinesis, RV systolic function moderately reduced. PMT consulted to discuss GOC.   Clinical Assessment and Goals of Care: I have reviewed medical records including EPIC notes, labs and imaging, received report from RN, assessed the patient and then met with patient's spouse [name]  to discuss diagnosis prognosis, GOC, EOL wishes, disposition and options.  I introduced Palliative Medicine as specialized medical care for people living with serious illness. It focuses on providing relief from the symptoms and stress of a serious illness. The goal is to improve quality of life for both the patient and the family.  We discussed a brief life review of the patient.  Spouse tells me they have been married 63 years and they have 4 children.  As far as functional and nutritional status spouse tells me of a decline in patient.  He tells me she is mostly sedentary.  She spends most of her time in her recliner.  When she does get up to walk to the bathroom or to bed she uses a walker and the assistance of her husband.  He tells me she has maintained a good appetite.  He tells me of 2 recent falls.  He tells me of declining cognition over the past 2 to 3 months, becoming more confused.  He tells me sometimes she does not recognize him or other family members.  He tells me of increased agitation at home as well.    We discussed patient's current illness and what it means in the larger context of patient's on-going co-morbidities.  Natural disease trajectory and expectations at EOL were discussed.  We discussed patient's heart failure and how this is a chronic progressive disease.  He understands.  I attempted to elicit values and goals of care important to the patient.    The difference between aggressive medical intervention and comfort care was considered in light of the patient's goals of care.   Advance directives, concepts specific to code status, artificial feeding and hydration, and rehospitalization were considered and discussed.  Patient is currently listed as DNR but would except intubation.  Husband confirms this is accurate, he is open to intubation if needed.  He is also open to rehospitalization following discharge.  He is open to rehab if needed.  Discussed with spouse the importance of continued conversation with family and the medical providers regarding overall plan of care and treatment options, ensuring decisions are within the context of the patient's values and GOCs.    Hospice and Palliative Care services outpatient were explained and offered.  We discussed when hospice may be appropriate.  He tells me his mom had hospice care for heart failure and so he understands what this could look like.  He does not feel they are at this point for his wife.  Questions and concerns were addressed. The family was encouraged to call with questions or concerns.    Primary Decision Maker NEXT OF KIN - spouse [name]    SUMMARY OF RECOMMENDATIONS  DNR but open to intubation Hopeful for improvement and ability to return home but open to rehab if recommended Hospice introduced - not in line with goals at this time, discussed when it may be appropriate PMT will follow intermittently  Code Status/Advance Care Planning: DNR     Primary Diagnoses: Present on Admission:  [facility])   I have  reviewed the medical record, interviewed the patient and family, and examined the patient. The following aspects are pertinent.  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   GERD (gastroesophageal reflux disease)    H/O hiatal hernia    Hearing loss    left ear    History of uterine cancer 1975   s/p hysterectomy   Hyperlipidemia    Hypertension    Numbness and tingling    hands and feet bilat    Pneumonia yrs ago   Shortness of breath dyspnea    Syncope 07/16/2018   Tinnitus    Urge urinary incontinence    Urinary incontinence    Vitamin D  deficiency    Social History   Socioeconomic History   Marital status: Married    Spouse name: Not on file   Number of children: 4   Years of education: Not on file   Highest education level: Not on file  Occupational History   Not on file  Tobacco Use   Smoking status: Never   Smokeless tobacco: Never  Vaping Use   Vaping status: Never Used  Substance and Sexual Activity   Alcohol use: No   Drug use: No   Sexual activity: Not on file  Other Topics Concern   Not on file  Social History Narrative   Not on file   Social Drivers of Health   Financial Resource Strain: Not on file  Food Insecurity: Patient Unable To Answer (06/17/2024)   Hunger Vital Sign    Worried About Running Out of Food in the Last Year: Patient unable to answer    Ran Out of Food in the Last Year: Patient unable to answer  Transportation Needs: Patient Unable To Answer (06/17/2024)   PRAPARE - Transportation    Lack of Transportation (Medical): Patient unable to answer    Lack of Transportation (Non-Medical): Patient unable to answer  Physical Activity: Not on file  Stress: Not on file   Social Connections: Patient Unable To Answer (06/17/2024)   Social Connection and Isolation Panel    Frequency of Communication with Friends and Family: Patient unable to answer    Frequency of Social Gatherings with Friends and Family: Patient unable to answer    Attends Religious Services: Patient unable to answer    Active Member of Clubs or Organizations: Patient unable to answer    Attends Banker Meetings: Patient unable to answer    Marital Status: Patient unable to answer   Family History  Problem Relation Age of Onset   Stroke Mother    Stroke Father    Scheduled Meds:  apixaban   5 mg Oral BID   Chlorhexidine  Gluconate Cloth  6 each Topical Daily  guaiFENesin   600 mg Oral BID   metoprolol  succinate  50 mg Oral Daily   multivitamin with minerals  1 tablet Oral Daily   mouth rinse  15 mL Mouth Rinse 4 times per day   oxidized cellulose  1 each Topical STAT   QUEtiapine   50 mg Oral QHS   rosuvastatin   20 mg Oral Daily   Continuous Infusions:  piperacillin -tazobactam (ZOSYN )  IV 3.375 g (06/18/24 1341)   PRN Meds:.acetaminophen , docusate sodium , ipratropium-albuterol , pantoprazole , phenol, polyethylene glycol Allergies  Allergen Reactions   Duloxetine Hcl Other (See Comments)    jumping legs   Cefuroxime Rash   Codeine Nausea And Vomiting   Zofran  [Ondansetron  Hcl] Rash   Review of Systems  Unable to perform ROS: Dementia    Physical Exam Constitutional:      General: She is not in acute distress.    Appearance: She is ill-appearing.  Pulmonary:     Effort: Pulmonary effort is normal.  Skin:    General: Skin is warm and dry.  Neurological:     Mental Status: She is alert. She is disoriented.     Vital Signs: BP (!) 131/96   Pulse (!) 103   Temp (!) 97.5 F (36.4 C) (Axillary)   Resp (!) 30   Wt 82.8 kg   SpO2 95%   BMI 26.96 kg/m  Pain Scale: 0-10   Pain Score: 0-No pain   SpO2: SpO2: 95 % O2 Device:SpO2: 95 % O2 Flow Rate: .O2  Flow Rate (L/min): 0.5 L/min  IO: Intake/output summary:  Intake/Output Summary (Last 24 hours) at 06/18/2024 1400 Last data filed at 06/18/2024 1300 Gross per 24 hour  Intake 3627.01 ml  Output 100 ml  Net 3527.01 ml    LBM: Last BM Date : 06/17/24 Baseline Weight: Weight: 84.4 kg Most recent weight: Weight: 82.8 kg     Palliative Assessment/Data: PPS 50%     *Please note that this is a verbal dictation therefore any spelling or grammatical errors are due to the Dragon [employer] One system interpretation.   Time Total: 65 minutes Time spent includes: Detailed review of medical records (labs, imaging, vital signs), medically appropriate exam, discussion with treatment team, counseling and educating patient, family and/or staff, documenting clinical information, medication management and coordination of care.    Tobey Jama Barnacle, DNP, AGNP-C Palliative Medicine Team [phone] Pager: [phone]

## 2024-06-18 NOTE — Consult Note (Addendum)
 Cardiology Consultation   Patient ID: Phyllis Knight MRN: [id number]; DOB: 05-26-1941  Admit date: 06/16/2024 Date of Consult: 06/18/2024  PCP:  Phyllis Pellet, MD   [facility] Providers Cardiologist:  Phyllis [name], MD       Patient Profile: Phyllis Knight is a 83 y.o. 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 Theophilus.  History of Present Illness: Ms. Phyllis Knight is an 84 year old female with above medical history who has been followed by Dr. Lawrence.  Patient previously had an echocardiogram in 01/2019 that showed EF 60-65%, mild LVH, low normal RV systolic function, mild-moderate mitral and tricuspid regurgitation, mild left atrial dilation and moderate right atrial dilation.  Cardiac monitor in 04/2019 showed no atrial fibrillation.  Stress test in 06/2019 was a low risk study. Has been on eliquis  for PAF. Patient was last seen by Dr. Lawrence in 01/2023.    She was recently seen in the ED on 06/05/2024.  She has dementia, lives with her husband.  On 7/5, she was left alone for few hours.  SHE had fallen and hit her eye on the wall.  Patient was awake and alert at her baseline when she was found.  Imaging showed a depressed orbital fracture with some fat entrapment and retro-bulbar hemorrhage.  Case was discussed with Dr. Octavia with ophthalmology.  Recommended symptomatic treatment for pain with outpatient follow-up in his office for monitoring.  She was instructed to hold the next 2 doses of her Eliquis .  Patient presented to the ED on 7/16 complaining of shortness of breath that started that morning and painful urination for the past 3 days.  Initial EKG in the ED showed atrial fibrillation with heart rate 149 bpm.  Workup in the ED revealed UA concerning for UTI.  Lactic acid 7.8> 6.4> 3.9> 2.0.  High-sensitivity troponin 160> 243>  306> 239.  BNP elevated to 3064.  Chest x-ray showed enlarged cardiopericardial silhouette, prominent central vasculature, diffuse interstitial changes.  CTA chest/ab/pelvis for dissection showed no aortic aneurysm intramural hematoma or aortic dissection, mild cardiomegaly with findings of pulmonary edema.  She was admitted to the critical care service for mixed septic and cardiogenic shock with lactic acidosis.  Started on IV amiodarone  for atrial fibrillation.  Also started on antibiotics.  Echocardiogram 7/17 showed EF 25-30%, moderately reduced RV systolic function, mildly elevated PA systolic pressure, mild MR, moderate TR.  She was given 2 doses of IV Lasix  20 mg yesterday.  Creatinine increased from 0.95-1.28.  On interview, patient reports being very tired and is trying to take a nap. Denies being in any pain.   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   GERD (gastroesophageal reflux disease)    H/O hiatal hernia    Hearing loss    left ear    History of uterine cancer 1975   s/p hysterectomy   Hyperlipidemia    Hypertension    Numbness and tingling    hands and feet bilat  Pneumonia yrs ago   Shortness of breath dyspnea    Syncope 07/16/2018   Tinnitus    Urge urinary incontinence    Urinary incontinence    Vitamin D  deficiency     Past Surgical History:  Procedure Laterality Date   CERVICAL FUSION  2011   CHOLECYSTECTOMY N/A 06/14/2014   Procedure: LAPAROSCOPIC CHOLECYSTECTOMY WITH attempted INTRAOPERATIVE CHOLANGIOGRAM;  Surgeon: Alm VEAR Angle, MD;  Location: [facility];  Service: General;  Laterality: N/A;   INSERTION OF MESH N/A 07/18/2015   Procedure: INSERTION OF MESH;  Surgeon: Alm Angle,  MD;  Location: [facility];  Service: General;  Laterality: N/A;   KNEE CLOSED REDUCTION Left 09/21/2013   Procedure: CLOSED MANIPULATION LEFT KNEE;  Surgeon: Donnice JONETTA Car, MD;  Location: [facility];  Service: Orthopedics;  Laterality: Left;   TOTAL ABDOMINAL HYSTERECTOMY W/ BILATERAL SALPINGOOPHORECTOMY  1972   TOTAL KNEE ARTHROPLASTY Left 08/03/2013   Procedure: LEFT TOTAL KNEE ARTHROPLASTY;  Surgeon: Donnice JONETTA Car, MD;  Location: [facility];  Service: Orthopedics;  Laterality: Left;   tracheotomy     TYMPANOPLASTY Left    VENTRAL HERNIA REPAIR N/A 07/18/2015   Procedure: LAPAROSCOPIC VENTRAL AND UMBILICAL  HERNIA LYSIS OF ADHESIONS;  Surgeon: Alm Angle, MD;  Location: [facility];  Service: General;  Laterality: N/A;     Scheduled Meds:  apixaban   5 mg Oral BID   Chlorhexidine  Gluconate Cloth  6 each Topical Daily   guaiFENesin   600 mg Oral BID   metoprolol  succinate  50 mg Oral Daily   multivitamin with minerals  1 tablet Oral Daily   mouth rinse  15 mL Mouth Rinse 4 times per day   oxidized cellulose  1 each Topical STAT   QUEtiapine   50 mg Oral QHS   rosuvastatin   20 mg Oral Daily   Continuous Infusions:  piperacillin -tazobactam (ZOSYN )  IV 3.375 g (06/18/24 1341)   PRN Meds: acetaminophen , docusate sodium , ipratropium-albuterol , pantoprazole , phenol, polyethylene glycol  Allergies:    Allergies  Allergen Reactions   Duloxetine Hcl Other (See Comments)    jumping legs   Cefuroxime Rash   Codeine Nausea And Vomiting   Zofran  [Ondansetron  Hcl] Rash    Social History:   Social History   Socioeconomic History   Marital status: Married    Spouse name: Not on file   Number of children: 4   Years of education: Not on file   Highest education level: Not on file  Occupational History   Not on file  Tobacco Use   Smoking status: Never   Smokeless tobacco: Never  Vaping Use   Vaping status: Never Used  Substance and Sexual Activity   Alcohol use: No   Drug  use: No   Sexual activity: Not on file  Other Topics Concern   Not on file  Social History Narrative   Not on file   Social Drivers of Health   Financial Resource Strain: Not on file  Food Insecurity: Patient Unable To Answer (06/17/2024)   Hunger Vital Sign    Worried About Running Out of Food in the Last Year: Patient unable to answer    Ran Out of Food in the Last Year: Patient unable to answer  Transportation Needs: Patient Unable To Answer (06/17/2024)   PRAPARE - Transportation    Lack of Transportation (Medical): Patient unable to answer    Lack of Transportation (Non-Medical): Patient unable to answer  Physical Activity: Not on file  Stress: Not on file  Social Connections: Patient Unable To Answer (06/17/2024)   Social Connection and Isolation Panel    Frequency of Communication with Friends and Family: Patient unable to answer    Frequency of Social Gatherings with Friends and Family: Patient unable to answer    Attends Religious Services: Patient unable to answer    Active Member of Clubs or Organizations: Patient unable to answer    Attends Banker Meetings: Patient unable to answer    Marital Status: Patient unable to answer  Intimate Partner Violence: Patient Unable To Answer (06/17/2024)   Humiliation, Afraid, Rape, and Kick questionnaire    Fear of Current or Ex-Partner: Patient unable to answer    Emotionally Abused: Patient unable to answer    Physically Abused: Patient unable to answer    Sexually Abused: Patient unable to answer    Family History:   Family History  Problem Relation Age of Onset   Stroke Mother    Stroke Father      ROS:  Please see the history of present illness.   All other ROS reviewed and negative.     Physical Exam/Data: Vitals:   06/18/24 1000 06/18/24 1100 06/18/24 1200 06/18/24 1300  BP: (!) 122/100 (!) 126/93 (!) 139/124 (!) 131/96  Pulse: 94 95 (!) 102 (!) 103  Resp: (!) 28 (!) 27 (!) 29 (!) 30  Temp:       TempSrc:      SpO2: 94% 95% 94% 95%  Weight:        Intake/Output Summary (Last 24 hours) at 06/18/2024 1349 Last data filed at 06/18/2024 1300 Gross per 24 hour  Intake 3638.46 ml  Output 100 ml  Net 3538.46 ml      06/18/2024    4:10 AM 06/17/2024    5:00 AM 06/16/2024    6:27 PM  Last 3 Weights  Weight (lbs) 182 lb 8.7 oz 188 lb 7.9 oz 186 lb 1.1 oz  Weight (kg) 82.8 kg 85.5 kg 84.4 kg     Body mass index is 26.96 kg/m.  General:  Thin, elderly female. Sitting upright in the bed. No acute distress. Appears fatigued  HEENT: normal Neck: no JVD Vascular: Radial pulses 2+ bilaterally Cardiac:  normal S1, S2; irregular rate and rhythm  Lungs:  coarse breath sounds throughout  Abd: soft, nontender  Ext: no edema in BLE  Musculoskeletal:  No deformities  Skin: warm and dry  Neuro:  CNs 2-12 intact, no focal abnormalities noted Psych:  Normal affect   Telemetry:  Telemetry was personally reviewed and demonstrates:  Atrial fibrillation, HR in the 70s-90s   Relevant CV Studies: Cardiac Studies & Procedures   ______________________________________________________________________________________________   STRESS TESTS  PCV MYOCARDIAL PERFUSION WITH LEXISCAN  06/07/2019  Narrative Lexiscan  Myoview  stress test 06/07/2019: Lexiscan  stress test was performed. Stress EKG is non-diagnostic, as this is pharmacological stress test. In addition, Rest and stress EKG reveal sinus rhythm, frequent PVC's and occasional PAC's. SPECT images revealed small sized, mildly reversible, mild intensity perfusion defect in basal inferior myocardium. While breast attenuation is possible (imaging performed in sitting position), small area of ischemia cannot be excluded. LVEF 73% with normal wall motion. Low risk study.   ECHOCARDIOGRAM  ECHOCARDIOGRAM COMPLETE 06/17/2024  Narrative ECHOCARDIOGRAM REPORT    Patient Name:   JASNEET SCHOBERT Traweek Date of Exam: 06/17/2024 Medical Rec #:  [id number]         Height:       69.0 in Accession #:    [id number]  Weight:       188.5 lb Date of Birth:  Jun 23, 1941        BSA:          2.015 m Patient Age:    82 years         BP:           110/84 mmHg Patient Gender: F                HR:           104 bpm. Exam Location:  Inpatient  Procedure: 2D Echo, Cardiac Doppler, Color Doppler and Intracardiac Opacification Agent (Both Spectral and Color Flow Doppler were utilized during procedure).  Indications:    Atrial Fibrillation  History:        Patient has prior history of Echocardiogram examinations. Arrythmias:Atrial Fibrillation and PVC; Risk Factors:Hypertension.  Sonographer:    Christiana Mbomeh Referring Phys: [id number] TORIBIO JAYSON SHARPS  IMPRESSIONS   1. Left ventricular ejection fraction, by estimation, is 25 to 30%. The left ventricle has severely decreased function. The left ventricle demonstrates global hypokinesis. Left ventricular diastolic function could not be evaluated. 2. Right ventricular systolic function is moderately reduced. The right ventricular size is moderately enlarged. There is mildly elevated pulmonary artery systolic pressure. The estimated right ventricular systolic pressure is 44.6 mmHg. 3. Left atrial size was mildly dilated. 4. Right atrial size was mildly dilated. 5. The mitral valve is normal in structure. Mild mitral valve regurgitation. No evidence of mitral stenosis. 6. Tricuspid valve regurgitation is moderate. 7. The aortic valve is normal in structure. Aortic valve regurgitation is mild. No aortic stenosis is present. 8. The inferior vena cava is dilated in size with <50% respiratory variability, suggesting right atrial pressure of 15 mmHg.  FINDINGS Left Ventricle: Left ventricular ejection fraction, by estimation, is 25 to 30%. The left ventricle has severely decreased function. The left ventricle demonstrates global hypokinesis. Definity  contrast agent was given IV to delineate the left ventricular  endocardial borders. The left ventricular internal cavity size was normal in size. There is no left ventricular hypertrophy. Left ventricular diastolic function could not be evaluated due to atrial fibrillation. Left ventricular diastolic function could not be evaluated.  Right Ventricle: The right ventricular size is moderately enlarged. No increase in right ventricular wall thickness. Right ventricular systolic function is moderately reduced. There is mildly elevated pulmonary artery systolic pressure. The tricuspid regurgitant velocity is 2.72 m/s, and with an assumed right atrial pressure of 15 mmHg, the estimated right ventricular systolic pressure is 44.6 mmHg.  Left Atrium: Left atrial size was mildly dilated.  Right Atrium: Right atrial size was mildly dilated.  Pericardium: There is no evidence of pericardial effusion.  Mitral Valve: The mitral valve is normal in structure. Mild mitral valve regurgitation. No evidence of mitral valve stenosis.  Tricuspid Valve: The tricuspid valve is normal in structure. Tricuspid valve regurgitation is moderate . No evidence of tricuspid stenosis.  Aortic Valve: The aortic valve is normal in structure. Aortic valve regurgitation is mild. No aortic stenosis is present. Aortic valve mean gradient measures 3.7 mmHg. Aortic valve peak gradient measures 6.7 mmHg. Aortic valve area, by VTI measures 1.49 cm.  Pulmonic Valve: The pulmonic valve was normal in structure. Pulmonic valve regurgitation is not visualized. No evidence of pulmonic stenosis.  Aorta: The aortic root is normal in size and structure.  Venous: The inferior vena cava is dilated in size with less than 50% respiratory variability, suggesting right atrial pressure  of 15 mmHg.  IAS/Shunts: No atrial level shunt detected by color flow Doppler.   LEFT VENTRICLE PLAX 2D LVIDd:         5.10 cm LVIDs:         4.30 cm LV PW:         0.90 cm LV IVS:        1.10 cm LVOT diam:     1.80  cm LV SV:         28 LV SV Index:   14 LVOT Area:     2.54 cm  LV Volumes (MOD) LV vol d, MOD A2C: 119.0 ml LV vol d, MOD A4C: 113.0 ml LV vol s, MOD A2C: 70.8 ml LV vol s, MOD A4C: 84.5 ml LV SV MOD A2C:     48.2 ml LV SV MOD A4C:     113.0 ml LV SV MOD BP:      39.8 ml  RIGHT VENTRICLE TAPSE (M-mode): 1.4 cm  LEFT ATRIUM             Index        RIGHT ATRIUM           Index LA diam:        3.80 cm 1.89 cm/m   RA Area:     20.60 cm LA Vol (A2C):   68.1 ml 33.80 ml/m  RA Volume:   64.70 ml  32.12 ml/m LA Vol (A4C):   67.5 ml 33.51 ml/m LA Biplane Vol: 69.8 ml 34.65 ml/m AORTIC VALVE AV Area (Vmax):    1.59 cm AV Area (Vmean):   1.57 cm AV Area (VTI):     1.49 cm AV Vmax:           129.67 cm/s AV Vmean:          90.367 cm/s AV VTI:            0.188 m AV Peak Grad:      6.7 mmHg AV Mean Grad:      3.7 mmHg LVOT Vmax:         80.77 cm/s LVOT Vmean:        55.733 cm/s LVOT VTI:          0.110 m LVOT/AV VTI ratio: 0.58  AORTA Ao Root diam: 2.80 cm Ao Asc diam:  3.20 cm  TRICUSPID VALVE TR Peak grad:   29.6 mmHg TR Vmax:        272.00 cm/s  SHUNTS Systemic VTI:  0.11 m Systemic Diam: 1.80 cm  Morene Brownie Electronically signed by Morene Brownie Signature Date/Time: 06/17/2024/5:17:56 PM    Final    MONITORS  LONG TERM MONITOR (3-14 DAYS) 04/03/2021  Narrative Mobile cardiac telemetry 7 days 03/21/2021 - 03/29/2021: Dominant rhythm: Sinus. HR 43-90 bpm. Avg HR 61 bpm, in sinus rhythm. 282 episodes of SVT, fastest at 164 bpm for 6 beats, longest for 17.4 secs at 112 bpm. <1% isolated SVE, couplet/triplets. <1% isolated VE, couplets. No atrial fibrillation/atrial flutter/VT/high grade AV block, sinus pause >3sec noted. 0 patient triggered events.       ______________________________________________________________________________________________       Laboratory Data: High Sensitivity Troponin:   Recent Labs  Lab 06/16/24 1255  06/16/24 1506 06/16/24 1700 06/16/24 2021  TROPONINIHS 160* 243* 306* 239*     Chemistry Recent Labs  Lab 06/16/24 1506 06/16/24 1517 06/17/24 0420 06/18/24 0412  NA 136 137 136 138  K 3.8 4.0 3.5 3.5  CL 105  --  102 102  CO2 16*  --  21* 21*  GLUCOSE 120*  --  173* 107*  BUN 14  --  15 20  CREATININE 0.97  --  0.95 1.28*  CALCIUM  8.5*  --  8.7* 8.5*  MG  --   --  1.6* 2.4  GFRNONAA 58*  --  59* 42*  ANIONGAP 15  --  13 15    Recent Labs  Lab 06/16/24 1506 06/17/24 0420 06/18/24 0412  PROT 7.3 7.6 7.8  ALBUMIN  3.0* 3.0* 3.3*  AST 250* 176* 126*  ALT 103* 87* 86*  ALKPHOS 70 50 48  BILITOT 2.1* 1.5* 1.6*   Lipids No results for input(s): CHOL, TRIG, HDL, LABVLDL, LDLCALC, CHOLHDL in the last 168 hours.  Hematology Recent Labs  Lab 06/16/24 1255 06/16/24 1444 06/16/24 1517 06/16/24 2021 06/17/24 0420 06/18/24 0412  WBC 3.5*  --   --   --  5.0 9.1  RBC 3.80*  --   --   --  3.47* 3.54*  HGB 12.2   < > 12.6  --  11.0* 11.3*  HCT 38.0   < > 37.0  --  34.1* 34.8*  MCV 100.0  --   --   --  98.3 98.3  MCH 32.1  --   --   --  31.7 31.9  MCHC 32.1  --   --   --  32.3 32.5  RDW 15.0  --   --   --  14.8 14.9  PLT 119*  --   --  112* 100* 104*   < > = values in this interval not displayed.   Thyroid  No results for input(s): TSH, FREET4 in the last 168 hours.  BNP Recent Labs  Lab 06/16/24 1255 06/17/24 0420  BNP 3,064.7* 3,334.3*    DDimer No results for input(s): DDIMER in the last 168 hours.  Radiology/Studies:  ECHOCARDIOGRAM COMPLETE Result Date: 06/17/2024    ECHOCARDIOGRAM REPORT   Patient Name:   BRINKLEE CISSE Mccosh Date of Exam: 06/17/2024 Medical Rec #:  [id number]        Height:       69.0 in Accession #:    [id number]       Weight:       188.5 lb Date of Birth:  01-Feb-1941        BSA:          2.015 m Patient Age:    82 years         BP:           110/84 mmHg Patient Gender: F                HR:           104 bpm. Exam Location:   Inpatient Procedure: 2D Echo, Cardiac Doppler, Color Doppler and Intracardiac            Opacification Agent (Both Spectral and Color Flow Doppler were            utilized during procedure). Indications:    Atrial Fibrillation  History:        Patient has prior history of Echocardiogram examinations.                 Arrythmias:Atrial Fibrillation and PVC; Risk                 Factors:Hypertension.  Sonographer:    Christiana Mbomeh Referring Phys: [id number] TORIBIO JAYSON SHARPS IMPRESSIONS  1. Left ventricular ejection  fraction, by estimation, is 25 to 30%. The left ventricle has severely decreased function. The left ventricle demonstrates global hypokinesis. Left ventricular diastolic function could not be evaluated.  2. Right ventricular systolic function is moderately reduced. The right ventricular size is moderately enlarged. There is mildly elevated pulmonary artery systolic pressure. The estimated right ventricular systolic pressure is 44.6 mmHg.  3. Left atrial size was mildly dilated.  4. Right atrial size was mildly dilated.  5. The mitral valve is normal in structure. Mild mitral valve regurgitation. No evidence of mitral stenosis.  6. Tricuspid valve regurgitation is moderate.  7. The aortic valve is normal in structure. Aortic valve regurgitation is mild. No aortic stenosis is present.  8. The inferior vena cava is dilated in size with <50% respiratory variability, suggesting right atrial pressure of 15 mmHg. FINDINGS  Left Ventricle: Left ventricular ejection fraction, by estimation, is 25 to 30%. The left ventricle has severely decreased function. The left ventricle demonstrates global hypokinesis. Definity  contrast agent was given IV to delineate the left ventricular endocardial borders. The left ventricular internal cavity size was normal in size. There is no left ventricular hypertrophy. Left ventricular diastolic function could not be evaluated due to atrial fibrillation. Left ventricular diastolic  function could not be evaluated. Right Ventricle: The right ventricular size is moderately enlarged. No increase in right ventricular wall thickness. Right ventricular systolic function is moderately reduced. There is mildly elevated pulmonary artery systolic pressure. The tricuspid regurgitant velocity is 2.72 m/s, and with an assumed right atrial pressure of 15 mmHg, the estimated right ventricular systolic pressure is 44.6 mmHg. Left Atrium: Left atrial size was mildly dilated. Right Atrium: Right atrial size was mildly dilated. Pericardium: There is no evidence of pericardial effusion. Mitral Valve: The mitral valve is normal in structure. Mild mitral valve regurgitation. No evidence of mitral valve stenosis. Tricuspid Valve: The tricuspid valve is normal in structure. Tricuspid valve regurgitation is moderate . No evidence of tricuspid stenosis. Aortic Valve: The aortic valve is normal in structure. Aortic valve regurgitation is mild. No aortic stenosis is present. Aortic valve mean gradient measures 3.7 mmHg. Aortic valve peak gradient measures 6.7 mmHg. Aortic valve area, by VTI measures 1.49 cm. Pulmonic Valve: The pulmonic valve was normal in structure. Pulmonic valve regurgitation is not visualized. No evidence of pulmonic stenosis. Aorta: The aortic root is normal in size and structure. Venous: The inferior vena cava is dilated in size with less than 50% respiratory variability, suggesting right atrial pressure of 15 mmHg. IAS/Shunts: No atrial level shunt detected by color flow Doppler.  LEFT VENTRICLE PLAX 2D LVIDd:         5.10 cm LVIDs:         4.30 cm LV PW:         0.90 cm LV IVS:        1.10 cm LVOT diam:     1.80 cm LV SV:         28 LV SV Index:   14 LVOT Area:     2.54 cm  LV Volumes (MOD) LV vol d, MOD A2C: 119.0 ml LV vol d, MOD A4C: 113.0 ml LV vol s, MOD A2C: 70.8 ml LV vol s, MOD A4C: 84.5 ml LV SV MOD A2C:     48.2 ml LV SV MOD A4C:     113.0 ml LV SV MOD BP:      39.8 ml RIGHT VENTRICLE  TAPSE (M-mode): 1.4 cm LEFT ATRIUM  Index        RIGHT ATRIUM           Index LA diam:        3.80 cm 1.89 cm/m   RA Area:     20.60 cm LA Vol (A2C):   68.1 ml 33.80 ml/m  RA Volume:   64.70 ml  32.12 ml/m LA Vol (A4C):   67.5 ml 33.51 ml/m LA Biplane Vol: 69.8 ml 34.65 ml/m  AORTIC VALVE AV Area (Vmax):    1.59 cm AV Area (Vmean):   1.57 cm AV Area (VTI):     1.49 cm AV Vmax:           129.67 cm/s AV Vmean:          90.367 cm/s AV VTI:            0.188 m AV Peak Grad:      6.7 mmHg AV Mean Grad:      3.7 mmHg LVOT Vmax:         80.77 cm/s LVOT Vmean:        55.733 cm/s LVOT VTI:          0.110 m LVOT/AV VTI ratio: 0.58  AORTA Ao Root diam: 2.80 cm Ao Asc diam:  3.20 cm TRICUSPID VALVE TR Peak grad:   29.6 mmHg TR Vmax:        272.00 cm/s  SHUNTS Systemic VTI:  0.11 m Systemic Diam: 1.80 cm Morene Brownie Electronically signed by Morene Brownie Signature Date/Time: 06/17/2024/5:17:56 PM    Final    DG Chest Port 1 View Result Date: 06/16/2024 EXAM: 1 VIEW XRAY OF THE CHEST 06/16/2024 08:02:00 PM COMPARISON: None available. CLINICAL HISTORY: Encounter for central line placement. FINDINGS: LUNGS AND PLEURA: Pulmonary vascular congestion. Similar findings of pulmonary edema compared to same day chest CT. Small bilateral pleural effusions. No pneumothorax. HEART AND MEDIASTINUM: Stable cardiomegaly. Aortic atherosclerotic calcification. BONES AND SOFT TISSUES: No acute osseous abnormality. LINES AND TUBES: Right IJ central venous catheter with tip in the low SVC. IMPRESSION: 1. Pulmonary vascular congestion and pulmonary edema, similar to same day chest CT. 2. Right IJ CVC tip in the low SVC. 3. No pneumothorax. Electronically signed by: Norman Gatlin MD 06/16/2024 08:20 PM EDT RP Workstation: [id number]   US  EKG SITE RITE Result Date: 06/16/2024 If Site Rite image not attached, placement could not be confirmed due to current cardiac rhythm.  CT Angio Chest/Abd/Pel for Dissection W and/or  Wo Contrast Result Date: 06/16/2024 CLINICAL DATA:  Acute aortic syndrome (AAS) suspected, dyspnea worsening throughout the day, dysuria EXAM: CT ANGIOGRAPHY CHEST, ABDOMEN AND PELVIS TECHNIQUE: Non-contrast CT of the chest was initially obtained. Multidetector CT imaging through the chest, abdomen and pelvis was performed using the standard protocol during bolus administration of intravenous contrast. Multiplanar reconstructed images and MIPs were obtained and reviewed to evaluate the vascular anatomy. RADIATION DOSE REDUCTION: This exam was performed according to the departmental dose-optimization program which includes automated exposure control, adjustment of the mA and/or kV according to patient size and/or use of iterative reconstruction technique. CONTRAST:  OMNIPAQUE  IOHEXOL  350 MG/ML SOLN COMPARISON:  February 12, 2023, May 04, 2016 FINDINGS: CTA CHEST FINDINGS Pulmonary Embolism: While the exam was not optimized for the evaluation of the pulmonary arteries, no central pulmonary embolism visualized. Cardiovascular: Mild cardiomegaly. Small volume pericardial effusion. Scattered multi-vessel coronary atherosclerosis. No aortic aneurysm, intramural hematoma, or aortic dissection. Normal variant 2 vessel aortic arch anatomy with the left common carotid  arising from the brachiocephalic artery. Scattered aortic atherosclerosis. Mediastinum/Nodes: No mediastinal mass. No mediastinal, hilar, or axillary lymphadenopathy. Lungs/Pleura: The midline trachea and bronchi are patent. Peribronchial prominence with interlobular septal thickening predominantly in both upper lobes. Patchy ground-glass nodular opacities within both upper lobes with more pronounced nodular opacities in the right lower lobe. Small right pleural effusion. Trace left pleural effusion. No pneumothorax. Musculoskeletal: No acute fracture or destructive bone lesion. Moderate bilateral glenohumeral joint osteoarthritis. Review of the MIP images  confirms the above findings. CTA ABDOMEN AND PELVIS FINDINGS VASCULAR Aorta: No aortic aneurysm or aortic dissection. Scattered aortoiliac atherosclerosis. no hemodynamically significant stenosis. Celiac: Patent without acute thrombus, aneurysm, or dissection. No hemodynamically significant stenosis. SMA: Patent without acute thrombus, aneurysm, or dissection. No hemodynamically significant stenosis. Renals: Patent without acute thrombus, aneurysm, or dissection. Chunky calcified plaque at the ostia of both renal arteries. No hemodynamically significant stenosis. IMA: Patent without acute thrombus, aneurysm, or dissection. No hemodynamically significant stenosis. Inflow: Patent without acute thrombus, aneurysm, or dissection. No hemodynamically significant stenosis. Proximal Outflow: The bilateral common femoral and visualized portions of the superficial and profunda femoral arteries are patent without acute thrombus, aneurysm, or dissection. No hemodynamically significant stenosis. Veins: Nondiagnostic evaluation of the veins due to arterial timing of the contrast bolus. Review of the MIP images confirms the above findings. NON-VASCULAR Hepatobiliary: No mass.Cholecystectomy. No intrahepatic or extrahepatic biliary ductal dilation. Pancreas: No mass or main ductal dilation.No peripancreatic inflammation or fluid collection. Spleen: Normal size. No mass. Adrenals/Urinary Tract: No adrenal masses. No renal mass. No hydronephrosis or nephrolithiasis. Circumferential wall thickening of the urinary bladder. Stomach/Bowel: The stomach is decompressed without focal abnormality. No small bowel wall thickening or inflammation. No small bowel obstruction.Normal appendix. Small hyperdense nodule layering in the cecum measuring 1 cm (axial 205). Lymphatic: No intraabdominal or pelvic lymphadenopathy. Reproductive: Hysterectomy. No concerning adnexal mass. Presacral stranding without layering free fluid in the pelvis. Other: No  pneumoperitoneum, ascites, or mesenteric inflammation. Musculoskeletal: No acute fracture or destructive lesion.Diffuse anasarca. Diffuse osteopenia. Multilevel degenerative disc disease of the spine. Thoracic DISH. bilateral hip osteoarthritis. Review of the MIP images confirms the above findings. IMPRESSION: 1. No aortic aneurysm, intramural hematoma, or aortic dissection. 2. Mild cardiomegaly with findings of pulmonary edema. Small right and trace left pleural effusions. 3. Circumferential wall thickening of the urinary bladder, which may be due to underdistension. If there is concern for acute cystitis, correlation with urinalysis would be recommended. 4. Small hyperdense nodule layering in the cecum, measuring 1 cm (axial 205). While this could represent a small region of inspissated stool, possibly within a diverticulum, an enhancing polyp or neoplasm could have this appearance in the correct clinical context. Correlation with fecal Hemoccult recommended. Nonemergent colonoscopy could also be considered. 5. Presacral edema and diffuse anasarca, likely related to the patient's volume status. Aortic Atherosclerosis (ICD10-I70.0). Electronically Signed   By: Rogelia Myers M.D.   On: 06/16/2024 16:10   CT Head Wo Contrast Result Date: 06/16/2024 CLINICAL DATA:  Mental status change, unknown cause Reevaluation status post fall on July 5 EXAM: CT HEAD WITHOUT CONTRAST TECHNIQUE: Contiguous axial images were obtained from the base of the skull through the vertex without intravenous contrast. RADIATION DOSE REDUCTION: This exam was performed according to the departmental dose-optimization program which includes automated exposure control, adjustment of the mA and/or kV according to patient size and/or use of iterative reconstruction technique. COMPARISON:  06/05/2024 FINDINGS: Brain: There is atrophy and chronic small vessel disease changes. No acute intracranial  abnormality. Specifically, no hemorrhage,  hydrocephalus, mass lesion, acute infarction, or significant intracranial injury. Vascular: No hyperdense vessel or unexpected calcification. Skull: No acute calvarial abnormality. Sinuses/Orbits: Again noted is the right orbital floor fracture and herniated orbital fat, unchanged since prior study. Other: None IMPRESSION: Atrophy, chronic microvascular disease. No acute intracranial abnormality. No significant change since prior study. Electronically Signed   By: Franky Crease M.D.   On: 06/16/2024 15:57   DG Chest Port 1 View Result Date: 06/16/2024 CLINICAL DATA:  Shortness of breath EXAM: PORTABLE CHEST 1 VIEW COMPARISON:  Portable 06/05/2024 FINDINGS: Enlarged cardiopericardial silhouette. Calcified tortuous aorta. There is some widening of the mediastinum. This is unchanged. Prominent central vasculature. Diffuse interstitial changes. No pneumothorax, effusion or consolidation. Overlapping cardiac leads. Osteopenia degenerative changes. Fixation hardware along the lower cervical spine. IMPRESSION: No significant interval change. Electronically Signed   By: Ranell Bring M.D.   On: 06/16/2024 13:42     Assessment and Plan:  Acute systolic heart failure  Elevated HsTn  - Patient presented to the ED with shortness of breath, increased confusion, dysuria. Found to be febrile to 103. In afib with RVR. BNP elevated to 3000. hsTn 160>243>306>239. Given IV fluids in the ED and developed increasing SOB  - Admitted with sepsis, CHF  - Echo this admission with EF 25-30%, moderately reduced RV systolic function, mild MR, moderate TR  - SBP in the 120s-130s. Does have narrow pulse pressure. Extremities are warm and well perfused  - Patient was given 2 doses of IV lasix  20 mg yesterday with minimal urine output. Weight down to 182 lbs, was 186 on admission. Does not appear significantly volume overloaded today. Hold further diuresis for now  - Suspect cardiomyopathy is stress induced due to sepsis vs  tachymediated from afib with RVR. Not currently a cath candidate. Trop trend not consistent with ACS. No plans for ischemic evaluation at this time  - Pending clinical course and goals of care, can consider outpatient ischemic evaluation.  - OK to continue metoprolol  succinate 50 mg daily for now - Plan to start spironolactone tomorrow if BP allows.   PAF  - In afib with HR in the 80s-90s per tele  - Earlier this admission she was in afib with RVR requiring IV amiodarone . This has since been stopped. AST and ALT elevated today, no plans for oral amiodarone  at this time  - Continue eliquis  5 mg BID  - Continue metoprolol  succinate 50 mg daily   Otherwise per primary  - UTI, sepsis  - Dementia  -  Failure to thrive, severe malnutrition    Risk Assessment/Risk Scores:     [place]  Heart Association (NYHA) Functional Class NYHA Class II    For questions or updates, please contact [facility] Please consult [email or web address] for contact info under    Signed, Rollo FABIENE Louder, PA-C  06/18/2024 1:49 PM  Personally seen and examined. Agree with above.  Acute systolic failure 69% in the setting of sepsis - Moderate reduced cardiac function.  Blood pressure seems to be quite stable she shows no signs of cardiogenic shock.  Holding off on diuresis.  Does not appear to be fluid overloaded cardiomyopathy believe sepsis mediated perhaps even I am fine with her metoprolol  succinate 50 mg and starting spironolactone 12.5 mg once a day tomorrow.  Paroxysmal atrial fibrillation - Atrial fibrillation under present control. Is off - Continue with Eliquis  5 mg twice a day dose adjustment  UTI sepsis/dementia/failure to thrive.  Continue  with supportive care  Not a candidate for invasive measures.  Continue with goal-directed medical therapy for heart failure (no SGLT2 inhibitor given UTI)  Oneil Parchment, MD

## 2024-06-18 NOTE — Progress Notes (Signed)
 Heart Failure Navigator Progress Note  Assessed for Heart & Vascular TOC clinic readiness.  Patient does not meet criteria due to per MD note patient with history of Dementia. No HF TOC. .   Navigator will sign off at this time.   Randie Bustle, BSN, Scientist, clinical (histocompatibility and immunogenetics) Only

## 2024-06-18 NOTE — Progress Notes (Signed)
 Initial Nutrition Assessment  DOCUMENTATION CODES:  Not applicable  INTERVENTION:  Continue regular diet as ordered Try Carnation Breakfast Essentials TID, each packet mixed with 8 ounces of 2% milk provides 13 grams of protein and 260 calories. Magic cup BID with meals, each supplement provides 290 kcal and 9 grams of protein MVI with minerals daily  NUTRITION DIAGNOSIS:  Increased nutrient needs related to acute illness as evidenced by estimated needs  GOAL:  Patient will meet greater than or equal to 90% of their needs  MONITOR:  PO intake, Supplement acceptance, Labs, Weight trends, I & O's  REASON FOR ASSESSMENT:  Consult Assessment of nutrition requirement/status  ASSESSMENT:  Pt admitted with SOB, increased confusion over the last week found to have mixed septic and cardiogenic shock. PMH significant for dementia, fibromyalgia, uterine cancer, afib, GERD, HLD.  HF team consulted for severely decreased LV function and RV systolic function moderately reduced.   Spoke with pt and her husband at bedside. Nutrition history provided by pt with assistance of husband. Pt's husband reports that her oral intake can vary widely on a day-to-day basis. Some days she may eat 1 meal and others she may eat multiple times per day, but there is no consistency to her nutritional intake. She denies difficulty chewing/swallowing.   Pt's husband reports that her last meal was Tuesday for breakfast d/t admission and NPO status. Diet resumed to regular at time of visit. Provided Ensure for patient however she reports that she has tried these and does not like them. Left at bedside as husband feels that she may consume at a later time. Ordered alternative nutrition supplement for patient to try to augment intake.   Her mobility has significantly declined and her husband reports that she will get up and go to her comfy chair where she remains most of the day.   Pt reports having lost a significant  amount of weight over time recalling she used to weigh ~220 lbs at one time. At her last doctor's visit about 1 month ago, she reports a weight of 180 lbs.   Unfortunately, there is limited weight history on file to review within the last year to assess for significant weight changes. Current weight documented to be 82.8 kg which is in line with pt/husbands reported recent weight, though pt also with lower extremity edema which could be masking additional weight loss/muscle deficits.   Medications: reviewed  Labs:  Cr 1.28 AST 126 ALT 86 GFR 42  NUTRITION - FOCUSED PHYSICAL EXAM: Flowsheet Row Most Recent Value  Orbital Region No depletion  Upper Arm Region No depletion  Thoracic and Lumbar Region No depletion  Buccal Region No depletion  Temple Region No depletion  Clavicle Bone Region Severe depletion  Clavicle and Acromion Bone Region Severe depletion  Scapular Bone Region Severe depletion  Dorsal Hand Severe depletion  Patellar Region Unable to assess  [moderate edema bilaterally]  Anterior Thigh Region Unable to assess  Posterior Calf Region Unable to assess  [weeping]  Edema (RD Assessment) Moderate  [BLE]  Hair Reviewed  Eyes Reviewed  Mouth Reviewed  Skin Reviewed  Nails Reviewed    Diet Order:   Diet Order             Diet regular Room service appropriate? Yes; Fluid consistency: Thin  Diet effective now                   EDUCATION NEEDS:     Skin:  Skin Assessment: Reviewed  RN Assessment  Last BM:  7/17 type 7 large  Height:  Ht Readings from Last 1 Encounters:  06/05/24 5' 9 (1.753 m)    Weight:  Wt Readings from Last 1 Encounters:  06/18/24 82.8 kg    Ideal Body Weight:  65.9 kg  BMI:  Body mass index is 26.96 kg/m.  Estimated Nutritional Needs:   Kcal:  1700-1900  Protein:  85-100g  Fluid:  >/=1.7L  Royce Maris, RDN, LDN Clinical Nutrition See AMiON for contact information.

## 2024-06-18 NOTE — Progress Notes (Signed)
 [facility] ADULT ICU REPLACEMENT PROTOCOL   The patient does apply for the [facility] Adult ICU Electrolyte Replacment Protocol based on the criteria listed below:   1.Exclusion criteria: TCTS, ECMO, Dialysis, and Myasthenia Gravis patients 2. Is GFR >/= 30 ml/min? Yes.    Patient's GFR today is 42 3. Is SCr </= 2? Yes.   Patient's SCr is 1.28 mg/dL 4. Did SCr increase >/= 0.5 in 24 hours? No. 5.Pt's weight >40kg  Yes.   6. Abnormal electrolyte(s): K+ = 3.5  7. Electrolytes replaced per protocol 8.  Call MD STAT for K+ </= 2.5, Phos </= 1, or Mag </= 1 Physician:  Haze, eMD   Phyllis Knight Phyllis Knight 06/18/2024 5:59 AM

## 2024-06-18 NOTE — Progress Notes (Addendum)
 Attending note: I have seen and examined the patient. History, labs and imaging reviewed.  83 Y/O with dementia, afin, recent fall with orbital fracture Admitted with sepsis present on admission  Stable overnight. On room air Urine output is low  Blood pressure 119/86, pulse 84, temperature (!) 97.5 F (36.4 C), temperature source Axillary, resp. rate (!) 24, weight 82.8 kg, SpO2 94%. Gen:      No acute distress HEENT:  EOMI, sclera anicteric Neck:     No masses; no thyromegaly Lungs:    Clear to auscultation bilaterally; normal respiratory effort CV:         Regular rate and rhythm; no murmurs Abd:      + bowel sounds; soft, non-tender; no palpable masses, no distension Ext:    No edema; adequate peripheral perfusion Neuro: Awake, responsive  Labs/Imaging personally reviewed, significant for Cr 1.2 LVEF 25-30% AST 126, ALT 86  Assessment/plan: Mixed septic cardiogenic shock Sepsis present on admission, UTI, parainfluenza infection Continue abx. Zosyn  for 5 days, Off vanco as MRSA PCR is negative. Follow cultures   Severe malnutrition, failure to thrive, present on admission Nutrition consult   Afib Cardiomyopathy Off amio drip,  Continue lopressor  and eliquis  Consult cardiology  Severe malnutrition Encourage PO intake  Stable for transfer out of ICU  Lonna Coder MD  Pulmonary and Critical Care 06/18/2024, 8:29 AM

## 2024-06-18 NOTE — Plan of Care (Signed)
 Paged CHF on call and cardiology master [phone]

## 2024-06-18 NOTE — Progress Notes (Signed)
 NAME:  Phyllis Knight, MRN:  [id number], DOB:  12/26/1940, LOS: 2 ADMISSION DATE:  06/16/2024  History of Present Illness:  83yo F presented with SOB, increased confusion, and dysuria x3 days. Was seen 7/5 for fall on Eliquis , found to have closed orbital fracture.  Reported decreased functional ability over the last year. Increased confusion and incontinence over the last week which is new for her per family. Pt was placed on bipap by EMS due to oxygen saturation 88%, bipap continued in the ED. Found to be febrile to 103 and in afib with RVR. Sepsis protocol initiated, broad spectrum antibiotics started. Lactate 8. U/A negative for UTI. BNP found to be elevated at 3000, initial troponin 160. Pt became increasingly SOB after fluid resuscitation.   Pertinent  Medical History  Dementia, fibromyalgia, hx uterine cancer, afib on Eliquis , GERD, HLD   Significant Hospital Events: Including procedures, antibiotic start and stop dates in addition to other pertinent events   7/16 admitted to ICU, empiric Vancomycin  and Zosyn  started   Interim History / Subjective:  NAEON  Objective    Blood pressure 119/86, pulse 84, temperature (!) 97.5 F (36.4 C), temperature source Axillary, resp. rate (!) 24, weight 82.8 kg, SpO2 94%. CVP:  [23 mmHg-24 mmHg] 23 mmHg      Intake/Output Summary (Last 24 hours) at 06/18/2024 0841 Last data filed at 06/18/2024 0600 Gross per 24 hour  Intake 3567.12 ml  Output 100 ml  Net 3467.12 ml   Filed Weights   06/16/24 1827 06/17/24 0500 06/18/24 0410  Weight: 84.4 kg 85.5 kg 82.8 kg    Examination: General: chronically ill appearing, resting comfortably HENT: healing periorbital ecchymosis on right, slightly dry mucous membranes Lungs: Coarse breath sounds bilaterally, symmetric chest rise, on room air in no distress Cardiovascular: irregularly irregular, 1+ edema BLE Abdomen: bowel sounds present, no tenderness or distension  Resolved problem list  Acute  hypoxic respiratory failure Acute encephalopathy Assessment and Plan  Mixed septic and cardiogenic shock Procal downtrending, WBC normalized. Blood cultures no growth thus far, RPP negative, urine culture pending. Echo LVEF 25-30%, LV severely decreased function and global hypokinesis, RV systolic function moderately reduced - Continue Zosyn  x5d (7/16-) - Follow urine culture - Will consult heart failure team - Mucinex  for chest congestion - CVP monitoring q4h  Atrial fibrillation - Stop amiodarone  gtt - Continue home metoprolol  succinate 50mg  daily - Home Eliquis  5mg  BID - Stop home diltiazem   Best Practice (right click and Reselect all SmartList Selections daily)   Diet/type: Regular consistency (see orders) DVT prophylaxis DOAC Pressure ulcer(s): N/A GI prophylaxis: PPI Lines: Central line Foley:  N/A Code Status:  limited Last date of multidisciplinary goals of care discussion []   Labs   CBC: Recent Labs  Lab 06/16/24 1255 06/16/24 1444 06/16/24 1517 06/16/24 2021 06/17/24 0420 06/18/24 0412  WBC 3.5*  --   --   --  5.0 9.1  NEUTROABS 2.6  --   --   --   --   --   HGB 12.2 12.9 12.6  --  11.0* 11.3*  HCT 38.0 38.0 37.0  --  34.1* 34.8*  MCV 100.0  --   --   --  98.3 98.3  PLT 119*  --   --  112* 100* 104*    Basic Metabolic Panel: Recent Labs  Lab 06/16/24 1444 06/16/24 1506 06/16/24 1517 06/17/24 0420 06/18/24 0412  NA 136 136 137 136 138  K 5.2* 3.8 4.0 3.5 3.5  CL  --  105  --  102 102  CO2  --  16*  --  21* 21*  GLUCOSE  --  120*  --  173* 107*  BUN  --  14  --  15 20  CREATININE  --  0.97  --  0.95 1.28*  CALCIUM   --  8.5*  --  8.7* 8.5*  MG  --   --   --  1.6* 2.4  PHOS  --   --   --  3.5 3.4   GFR: Estimated Creatinine Clearance: 38.3 mL/min (A) (by C-G formula based on SCr of 1.28 mg/dL (H)). Recent Labs  Lab 06/16/24 1255 06/16/24 1445 06/16/24 1514 06/16/24 2021 06/17/24 0420 06/18/24 0412  PROCALCITON  --   --   --  2.89 3.38  2.96  WBC 3.5*  --   --   --  5.0 9.1  LATICACIDVEN  --  7.8* 6.4* 3.9* 2.0*  --     Liver Function Tests: Recent Labs  Lab 06/16/24 1506 06/17/24 0420 06/18/24 0412  AST 250* 176* 126*  ALT 103* 87* 86*  ALKPHOS 70 50 48  BILITOT 2.1* 1.5* 1.6*  PROT 7.3 7.6 7.8  ALBUMIN  3.0* 3.0* 3.3*   No results for input(s): LIPASE, AMYLASE in the last 168 hours. No results for input(s): AMMONIA in the last 168 hours.  ABG    Component Value Date/Time   HCO3 17.5 (L) 06/16/2024 1517   TCO2 18 (L) 06/16/2024 1517   ACIDBASEDEF 7.0 (H) 06/16/2024 1517   O2SAT 63.3 06/16/2024 2036     Coagulation Profile: Recent Labs  Lab 06/16/24 1305 06/16/24 2021  INR 1.8* 1.8*    Cardiac Enzymes: Recent Labs  Lab 06/16/24 2021  CKTOTAL 134    HbA1C: No results found for: HGBA1C  CBG: Recent Labs  Lab 06/17/24 1719 06/17/24 1909 06/17/24 2317 06/18/24 0313 06/18/24 0714  GLUCAP 107* 95 94 93 79    Review of Systems:   N/a  Past Medical History:  She,  has a past medical history of Anemia, Arthritis, Atrial fibrillation (HCC), Bradycardia, Bronchitis, Chest pain (06/10/2014), Coronary artery disease, Dizziness, Fibromyalgia, Fibrosis of left knee joint, GERD (gastroesophageal reflux disease), H/O hiatal hernia, Hearing loss, History of uterine cancer (1975), Hyperlipidemia, Hypertension, Numbness and tingling, Pneumonia (yrs ago), Shortness of breath dyspnea, Syncope (07/16/2018), Tinnitus, Urge urinary incontinence, Urinary incontinence, and Vitamin D  deficiency.   Surgical History:   Past Surgical History:  Procedure Laterality Date   CERVICAL FUSION  2011   CHOLECYSTECTOMY N/A 06/14/2014   Procedure: LAPAROSCOPIC CHOLECYSTECTOMY WITH attempted INTRAOPERATIVE CHOLANGIOGRAM;  Surgeon: Alm VEAR Angle, MD;  Location: [facility];  Service: General;  Laterality: N/A;   INSERTION OF MESH N/A 07/18/2015   Procedure: INSERTION OF MESH;  Surgeon: Alm Angle, MD;  Location: [facility];  Service: General;  Laterality: N/A;   KNEE CLOSED REDUCTION Left 09/21/2013   Procedure: CLOSED MANIPULATION LEFT KNEE;  Surgeon: Donnice JONETTA Car, MD;  Location: [facility];  Service: Orthopedics;  Laterality: Left;   TOTAL ABDOMINAL HYSTERECTOMY W/ BILATERAL SALPINGOOPHORECTOMY  1972   TOTAL KNEE ARTHROPLASTY Left 08/03/2013   Procedure: LEFT TOTAL KNEE ARTHROPLASTY;  Surgeon: Donnice JONETTA Car, MD;  Location: [facility];  Service: Orthopedics;  Laterality: Left;   tracheotomy     TYMPANOPLASTY Left    VENTRAL HERNIA REPAIR N/A 07/18/2015   Procedure: LAPAROSCOPIC VENTRAL AND UMBILICAL  HERNIA LYSIS OF ADHESIONS;  Surgeon: Alm Angle, MD;  Location: [facility];  Service: General;  Laterality: N/A;     Social History:   reports that she has never smoked. She has never used smokeless tobacco. She reports that she does not drink alcohol and does not use drugs.   Family History:  Her family history includes Stroke in her father and mother.   Allergies Allergies  Allergen Reactions   Duloxetine Hcl Other (See Comments)    jumping legs   Cefuroxime Rash   Codeine Nausea And Vomiting   Zofran  [Ondansetron  Hcl] Rash     Home Medications  Prior to Admission medications   Medication Sig Start Date End Date Taking? Authorizing Provider  acetaminophen  (TYLENOL ) 325 MG tablet Take 650 mg by mouth every 6 (six) hours as needed for mild pain.   Yes [provider]  amLODipine  (NORVASC ) 10 MG tablet Take 1 tablet (10 mg total) by mouth daily. 08/21/22  Yes Patwardhan, Manish J, MD  cetirizine (ZYRTEC) 10 MG tablet Take 10 mg by mouth daily as needed for rhinitis.   Yes [provider]  cholecalciferol  (VITAMIN D ) 1000 units tablet Take 2,000 Units by mouth daily.    Yes [provider]  diltiazem  (CARDIZEM ) 30 MG tablet TAKE 1 TABLET BY MOUTH 3  TIMES DAILY AS NEEDED Patient taking differently: Take 30 mg by mouth 3 (three) times daily as needed (increased  heart rate). 11/13/21  Yes Patwardhan, Manish J, MD  ELIQUIS  5 MG TABS tablet TAKE 1 TABLET BY MOUTH TWICE  DAILY 11/12/22  Yes Patwardhan, Manish J, MD  furosemide  (LASIX ) 20 MG tablet Take 1 tablet (20 mg total) by mouth daily. Patient taking differently: Take 10 mg by mouth daily. 06/20/20  Yes Patwardhan, Manish J, MD  metoprolol  succinate (TOPROL -XL) 50 MG 24 hr tablet TAKE 1 TABLET BY MOUTH  DAILY WITH OR IMMEDIATELY  FOLLOWING A MEAL Patient taking differently: Take 50 mg by mouth daily. 04/18/20  Yes Patwardhan, Manish J, MD  pantoprazole  (PROTONIX ) 40 MG tablet Take 1 tablet twice daily for 1 week and then 1 tablet once daily Patient taking differently: Take 40 mg by mouth daily as needed (for heartburn). 02/14/23  Yes Verdene Purchase, MD  potassium chloride  (KLOR-CON ) 8 MEQ tablet Take 8 mEq by mouth daily.  04/17/15  Yes [provider]  QUEtiapine  (SEROQUEL ) 50 MG tablet Take 50 mg by mouth at bedtime. 04/30/24  Yes [provider]  rosuvastatin  (CRESTOR ) 20 MG tablet TAKE 1 TABLET BY MOUTH DAILY 08/19/22  Yes Patwardhan, Manish J, MD  senna-docusate (SENOKOT-S) 8.6-50 MG tablet Take 1 tablet by mouth 2 (two) times daily. Patient taking differently: Take 1 tablet by mouth daily as needed for mild constipation. 02/14/23  Yes Krishnan, Gokul, MD  traMADol  (ULTRAM ) 50 MG tablet 1 to 2 tablets every 6 hours as needed with extra strength Tylenol . 06/05/24  Yes Armenta Canning, MD     Critical care time: 30 minutes

## 2024-06-19 ENCOUNTER — Inpatient Hospital Stay (HOSPITAL_COMMUNITY)

## 2024-06-19 DIAGNOSIS — I4891 Unspecified atrial fibrillation: Secondary | ICD-10-CM

## 2024-06-19 DIAGNOSIS — I480 Paroxysmal atrial fibrillation: Secondary | ICD-10-CM | POA: Diagnosis not present

## 2024-06-19 DIAGNOSIS — I5021 Acute systolic (congestive) heart failure: Secondary | ICD-10-CM | POA: Diagnosis not present

## 2024-06-19 DIAGNOSIS — R579 Shock, unspecified: Secondary | ICD-10-CM | POA: Diagnosis not present

## 2024-06-19 DIAGNOSIS — I509 Heart failure, unspecified: Secondary | ICD-10-CM

## 2024-06-19 LAB — COMPREHENSIVE METABOLIC PANEL WITH GFR
ALT: 107 U/L — ABNORMAL HIGH (ref 0–44)
AST: 145 U/L — ABNORMAL HIGH (ref 15–41)
Albumin: 3.4 g/dL — ABNORMAL LOW (ref 3.5–5.0)
Alkaline Phosphatase: 59 U/L (ref 38–126)
Anion gap: 14 (ref 5–15)
BUN: 20 mg/dL (ref 8–23)
CO2: 24 mmol/L (ref 22–32)
Calcium: 8.9 mg/dL (ref 8.9–10.3)
Chloride: 101 mmol/L (ref 98–111)
Creatinine, Ser: 1.09 mg/dL — ABNORMAL HIGH (ref 0.44–1.00)
GFR, Estimated: 50 mL/min — ABNORMAL LOW (ref >60)
Glucose, Bld: 81 mg/dL (ref 70–99)
Potassium: 3.5 mmol/L (ref 3.5–5.1)
Sodium: 139 mmol/L (ref 135–145)
Total Bilirubin: 1.6 mg/dL — ABNORMAL HIGH (ref 0.0–1.2)
Total Protein: 7 g/dL (ref 6.5–8.1)

## 2024-06-19 LAB — CBC
HCT: 36.3 % (ref 36.0–46.0)
Hemoglobin: 11.7 g/dL — ABNORMAL LOW (ref 12.0–15.0)
MCH: 31.4 pg (ref 26.0–34.0)
MCHC: 32.2 g/dL (ref 30.0–36.0)
MCV: 97.3 fL (ref 80.0–100.0)
Platelets: 108 K/uL — ABNORMAL LOW (ref 150–400)
RBC: 3.73 MIL/uL — ABNORMAL LOW (ref 3.87–5.11)
RDW: 14.9 % (ref 11.5–15.5)
WBC: 5.4 K/uL (ref 4.0–10.5)
nRBC: 0.4 % — ABNORMAL HIGH (ref 0.0–0.2)

## 2024-06-19 LAB — BRAIN NATRIURETIC PEPTIDE: B Natriuretic Peptide: 1843.1 pg/mL — ABNORMAL HIGH (ref 0.0–100.0)

## 2024-06-19 LAB — C-REACTIVE PROTEIN: CRP: 8.1 mg/dL — ABNORMAL HIGH (ref <1.0)

## 2024-06-19 LAB — MAGNESIUM: Magnesium: 2.3 mg/dL (ref 1.7–2.4)

## 2024-06-19 LAB — PHOSPHORUS: Phosphorus: 2.9 mg/dL (ref 2.5–4.6)

## 2024-06-19 LAB — PROCALCITONIN: Procalcitonin: 1.49 ng/mL

## 2024-06-19 MED ORDER — SPIRONOLACTONE 12.5 MG HALF TABLET
12.5000 mg | ORAL_TABLET | Freq: Every day | ORAL | Status: DC
  Administered 2024-06-19 – 2024-06-20 (×2): 12.5 mg via ORAL
  Filled 2024-06-19 (×2): qty 1

## 2024-06-19 MED ORDER — METOPROLOL SUCCINATE ER 25 MG PO TB24
25.0000 mg | ORAL_TABLET | Freq: Every day | ORAL | Status: DC
  Administered 2024-06-19 – 2024-06-22 (×4): 25 mg via ORAL
  Filled 2024-06-19 (×4): qty 1

## 2024-06-19 MED ORDER — ISOSORBIDE MONONITRATE ER 30 MG PO TB24
15.0000 mg | ORAL_TABLET | Freq: Every day | ORAL | Status: DC
  Administered 2024-06-19 – 2024-06-21 (×3): 15 mg via ORAL
  Filled 2024-06-19 (×3): qty 1

## 2024-06-19 MED ORDER — FUROSEMIDE 10 MG/ML IJ SOLN
60.0000 mg | Freq: Once | INTRAMUSCULAR | Status: AC
  Administered 2024-06-19: 60 mg via INTRAVENOUS
  Filled 2024-06-19: qty 6

## 2024-06-19 MED ORDER — HALOPERIDOL LACTATE 5 MG/ML IJ SOLN
2.0000 mg | Freq: Four times a day (QID) | INTRAMUSCULAR | Status: DC | PRN
  Administered 2024-06-19 – 2024-06-23 (×2): 2 mg via INTRAMUSCULAR
  Filled 2024-06-19 (×2): qty 1

## 2024-06-19 NOTE — TOC Initial Note (Signed)
 Transition of Care [facility]) - Initial/Assessment Note    Patient Details  Name: Phyllis Knight MRN: [id number] Date of Birth: 1941/02/28  Transition of Care [facility]) CM/SW Contact:    Luise JAYSON Pan, LCSWA Phone Number: 06/19/2024, 1:08 PM  Clinical Narrative:   Per chart review, Patient disoriented x3 at this time. CSW spoke with patients daughter, Corean, about PT recs for SNF. Corean stated family would like for patient to go to SNF. CSW explained that patients insurance may limit bed offers, Corean expressed her understanding. CSW to complete SNF workup at this time.   TOC will continue to follow.                Expected Discharge Plan: Skilled Nursing Facility Barriers to Discharge: Continued Medical Work up, SNF Pending bed offer, Insurance Authorization   Patient Goals and CMS Choice Patient states their goals for this hospitalization and ongoing recovery are:: To go to rehab          Expected Discharge Plan and Services In-house Referral: Clinical Social Work     Living arrangements for the past 2 months: Single Family Home                                      Prior Living Arrangements/Services Living arrangements for the past 2 months: Single Family Home Lives with:: Spouse Patient language and need for interpreter reviewed:: Yes Do you feel safe going back to the place where you live?: Yes      Need for Family Participation in Patient Care: Yes (Comment) Care giver support system in place?: Yes (comment)   Criminal Activity/Legal Involvement Pertinent to Current Situation/Hospitalization: No - Comment as needed  Activities of Daily Living      Permission Sought/Granted Permission sought to share information with : Facility Medical sales representative, Family Supports Permission granted to share information with : No (Pt disoriented, family contact in chart)  Share Information with NAME: Corean  Permission granted to share info w AGENCY:  SNFs  Permission granted to share info w Relationship: Daughter  Permission granted to share info w Contact Information: [phone]  Emotional Assessment Appearance:: Appears stated age Attitude/Demeanor/Rapport: Unable to Assess Affect (typically observed): Unable to Assess Orientation: : Oriented to Self Alcohol / Substance Use: Not Applicable Psych Involvement: No (comment)  Admission diagnosis:  Shock (HCC) [R57.9] SOB (shortness of breath) [R06.02] Acute respiratory failure with hypoxia (HCC) [J96.01] Atrial fibrillation with RVR (HCC) [I48.91] Congestive heart failure, unspecified HF chronicity, unspecified heart failure type (HCC) [I50.9] Sepsis, due to unspecified organism, unspecified whether acute organ dysfunction present [facility]) [A41.9] Patient Active Problem List   Diagnosis Date Noted   Shock (HCC) 06/16/2024   Hyponatremia 02/12/2023   Nausea & vomiting 02/12/2023   Dementia without behavioral disturbance (HCC) 05/10/2022   Confusion 05/10/2022   PVC (premature ventricular contraction) 09/12/2021   PSVT (paroxysmal supraventricular tachycardia) (HCC) 09/12/2021   Precordial pain 03/21/2021   Monitoring for long-term anticoagulant use 06/30/2020   Dizziness 04/13/2020   Mixed hyperlipidemia 04/13/2020   Leg edema 12/15/2019   Atypical chest pain 04/28/2019   Palpitations 04/28/2019   Influenza 02/14/2019   Paroxysmal A-fib (HCC) 02/14/2019   Neutropenia (HCC) 02/14/2019   Essential hypertension 07/17/2018   Fibromyalgia 07/17/2018   Pain in right knee 07/17/2018   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 [place] [address] [place] [address] Phone: [phone] Fax: [phone]  [facility] - [place], [place] - [address] [place] [address] Phone: [phone] Fax: [phone]  [facility] [phone] [place], [place] - [address] [place] [address] Phone: [phone] Fax: [phone]     Social Drivers of Health (SDOH) Social History: SDOH Screenings   Food Insecurity: Patient Unable To Answer (06/17/2024)  Housing: Unknown (06/17/2024)  Transportation Needs: Patient Unable To Answer (06/17/2024)  Utilities: Patient Unable To Answer (06/17/2024)  Social Connections: Patient Unable To Answer (06/17/2024)  Tobacco Use: Low Risk  (06/16/2024)   SDOH Interventions:     Readmission Risk Interventions     No data to display

## 2024-06-19 NOTE — Plan of Care (Signed)
 Patient up in chair with her brother at bedside.  Alert and oriented to name only; reorientation provided to patient. Call light within reach for patient's brother to utilized.  Scheduled medications given to patient. Problem: Education: Goal: Knowledge of General Education information will improve Description: Including pain rating scale, medication(s)/side effects and non-pharmacologic comfort measures Outcome: Progressing   Problem: Health Behavior/Discharge Planning: Goal: Ability to manage health-related needs will improve Outcome: Progressing   Problem: Clinical Measurements: Goal: Ability to maintain clinical measurements within normal limits will improve Outcome: Progressing Goal: Will remain free from infection Outcome: Progressing Goal: Diagnostic test results will improve Outcome: Progressing Goal: Respiratory complications will improve Outcome: Progressing Goal: Cardiovascular complication will be avoided Outcome: Progressing   Problem: Activity: Goal: Risk for activity intolerance will decrease Outcome: Progressing   Problem: Nutrition: Goal: Adequate nutrition will be maintained Outcome: Progressing   Problem: Coping: Goal: Level of anxiety will decrease Outcome: Progressing   Problem: Elimination: Goal: Will not experience complications related to bowel motility Outcome: Progressing Goal: Will not experience complications related to urinary retention Outcome: Progressing   Problem: Pain Managment: Goal: General experience of comfort will improve and/or be controlled Outcome: Progressing   Problem: Safety: Goal: Ability to remain free from injury will improve Outcome: Progressing   Problem: Skin Integrity: Goal: Risk for impaired skin integrity will decrease Outcome: Progressing

## 2024-06-19 NOTE — NC FL2 (Signed)
 [place]  MEDICAID FL2 LEVEL OF CARE FORM     IDENTIFICATION  Patient Name: Phyllis Knight Birthdate: 1940/12/28 Sex: female Admission Date (Current Location): 06/16/2024  [facility] and [place] Number:  Producer, television/film/video and Address:  [facility], [address], [place], [place] [address]      Provider Number: [id number]  Attending Physician Name and Address:  Dennise Lavada POUR, MD  Relative Name and Phone Number:       Current Level of Care: Hospital Recommended Level of Care: Skilled Nursing Facility Prior Approval Number:    Date Approved/Denied:   PASRR Number: [id number]  Discharge Plan: SNF    Current Diagnoses: Patient Active Problem List   Diagnosis Date Noted   Shock (HCC) 06/16/2024   Hyponatremia 02/12/2023   Nausea & vomiting 02/12/2023   Dementia without behavioral disturbance (HCC) 05/10/2022   Confusion 05/10/2022   PVC (premature ventricular contraction) 09/12/2021   PSVT (paroxysmal supraventricular tachycardia) (HCC) 09/12/2021   Precordial pain 03/21/2021   Monitoring for long-term anticoagulant use 06/30/2020   Dizziness 04/13/2020   Mixed hyperlipidemia 04/13/2020   Leg edema 12/15/2019   Atypical chest pain 04/28/2019   Palpitations 04/28/2019   Influenza 02/14/2019   Paroxysmal A-fib (HCC) 02/14/2019   Neutropenia (HCC) 02/14/2019   Essential hypertension 07/17/2018   Fibromyalgia 07/17/2018   Pain in right knee 07/17/2018   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   Expected blood loss anemia 08/04/2013   Obese 08/04/2013   S/P left TKA 08/03/2013    Orientation RESPIRATION BLADDER Height & Weight     Self  O2 (2L O2 [place]) Incontinent, External catheter Weight: 188 lb 4.4 oz (85.4 kg) Height:     BEHAVIORAL SYMPTOMS/MOOD NEUROLOGICAL BOWEL NUTRITION STATUS      Incontinent Diet (See discharge summary)  AMBULATORY STATUS COMMUNICATION OF NEEDS Skin   Extensive Assist Verbally Normal                        Personal Care Assistance Level of Assistance  Bathing, Feeding, Dressing Bathing Assistance:  (See discharge summary) Feeding assistance:  (See discharge summary) Dressing Assistance:  (See discharge summary)     Functional Limitations Info  Sight, Speech, Hearing Sight Info: Adequate Hearing Info: Adequate Speech Info: Adequate    SPECIAL CARE FACTORS FREQUENCY  PT (By licensed PT), OT (By licensed OT)     PT Frequency: 5x/week OT Frequency: 5x/week            Contractures Contractures Info: Not present    Additional Factors Info  Code Status, Allergies, Psychotropic Code Status Info: DNR interven Allergies Info: Duloxetine Hcl, Cefuroxime, Codeine, Zofran  (Ondansetron  Hcl) Psychotropic Info: SEROQUEL          Current Medications (06/19/2024):  This is the current hospital active medication list Current Facility-Administered Medications  Medication Dose Route Frequency Provider Last Rate Last Admin   acetaminophen  (TYLENOL ) tablet 650 mg  650 mg Oral Q6H PRN Paliwal, Aditya, MD   650 mg at 06/17/24 1213   apixaban  (ELIQUIS ) tablet 5 mg  5 mg Oral BID Babcock, Peter E, NP   5 mg at 06/19/24 9095   Chlorhexidine  Gluconate Cloth 2 % PADS 6 each  6 each Topical Daily Claudene Toribio BROCKS, MD   6 each at 06/19/24 1159   docusate sodium  (COLACE) capsule 100 mg  100 mg Oral BID PRN Claudene Toribio BROCKS, MD       feeding supplement (ENSURE PLUS HIGH PROTEIN)  liquid 237 mL  237 mL Oral BID BM Mannam, Praveen, MD   237 mL at 06/19/24 1158   guaiFENesin  (MUCINEX ) 12 hr tablet 600 mg  600 mg Oral BID Everhart, Kirstie, DO   600 mg at 06/19/24 0904   ipratropium-albuterol  (DUONEB) 0.5-2.5 (3) MG/3ML nebulizer solution 3 mL  3 mL Nebulization Q4H PRN Jenna Maude BRAVO, NP   3 mL at 06/19/24 0818   isosorbide  mononitrate (IMDUR ) 24 hr tablet 15 mg  15 mg Oral Daily Singh, Prashant K, MD   15 mg at 06/19/24 1158   metoprolol  succinate (TOPROL -XL) 24 hr tablet 25 mg  25 mg Oral Daily Singh,  Prashant K, MD   25 mg at 06/19/24 0903   multivitamin with minerals tablet 1 tablet  1 tablet Oral Daily Mannam, Praveen, MD   1 tablet at 06/19/24 9095   Oral care mouth rinse  15 mL Mouth Rinse 4 times per day Claudene Toribio BROCKS, MD   15 mL at 06/19/24 0800   pantoprazole  (PROTONIX ) EC tablet 40 mg  40 mg Oral Daily PRN Babcock, Peter E, NP       phenol (CHLORASEPTIC) mouth spray 1 spray  1 spray Mouth/Throat PRN Haze Led, MD   1 spray at 06/17/24 0459   piperacillin -tazobactam (ZOSYN ) IVPB 3.375 g  3.375 g Intravenous Q8H Mannam, Praveen, MD 12.5 mL/hr at 06/19/24 0437 3.375 g at 06/19/24 0437   polyethylene glycol (MIRALAX  / GLYCOLAX ) packet 17 g  17 g Oral Daily PRN Claudene Toribio BROCKS, MD       QUEtiapine  (SEROQUEL ) tablet 50 mg  50 mg Oral QHS Paliwal, Aditya, MD   50 mg at 06/18/24 2120   rosuvastatin  (CRESTOR ) tablet 20 mg  20 mg Oral Daily Babcock, Peter E, NP   20 mg at 06/19/24 9095     Discharge Medications: Please see discharge summary for a list of discharge medications.  Relevant Imaging Results:  Relevant Lab Results:   Additional Information SSN [id number]  Luise BROCKS Pan, LCSWA

## 2024-06-19 NOTE — Progress Notes (Signed)
  Progress Note  Patient Name: Phyllis Knight Date of Encounter: 06/19/2024 [facility] Cardiologist: Newman [name], MD   Interval Summary   No complaints, spoke to husband. She is in chair about to walk.   Vital Signs Vitals:   06/19/24 0438 06/19/24 0749 06/19/24 0823 06/19/24 0903  BP:  (!) 160/102  (!) 118/108  Pulse:  97 92 98  Resp:  20 20   Temp:  98 F (36.7 C)    TempSrc:  Oral    SpO2:  100%    Weight: 85.4 kg       Intake/Output Summary (Last 24 hours) at 06/19/2024 1319 Last data filed at 06/19/2024 1207 Gross per 24 hour  Intake 193.24 ml  Output 400 ml  Net -206.76 ml      06/19/2024    4:38 AM 06/18/2024    4:10 AM 06/17/2024    5:00 AM  Last 3 Weights  Weight (lbs) 188 lb 4.4 oz 182 lb 8.7 oz 188 lb 7.9 oz  Weight (kg) 85.4 kg 82.8 kg 85.5 kg       Physical Exam  GEN: No acute distress.   Neck: No JVD Cardiac: RRR, no murmurs, rubs, or gallops.  Respiratory: Clear to auscultation bilaterally. GI: Soft, nontender, non-distended  MS: No edema  Assessment & Plan   Acute systolic heart failure 30% EF in the setting of sepsis - Starting spironolactone  12.5 today. -Continue with Toprol  50 mg a day. -No Jardiance secondary to UTI -If able to tolerate, could consider ARB or perhaps Entresto.  Paroxysmal A-fib - ALT previously elevated.  No plans for amiodarone  at this time.  Chronic anticoagulation - On Eliquis  5 mg twice a day  UTI sepsis with dementia failure to thrive - Stable  For questions or updates, please contact [facility] Please consult [email or web address] for contact info under       Signed, Oneil Parchment, MD

## 2024-06-19 NOTE — Evaluation (Signed)
 Occupational Therapy Evaluation Patient Details Name: Phyllis Knight MRN: [id number] DOB: [birth year] Today's Date: 06/19/2024   History of Present Illness   Pt is an 83 yo female presenting to [facility] on 06/16/24 with SOB, possible septic shock secondary to UTI. PMH significant for HTN, Atrial Fibrillation on Eliquis , Dementia and HLD     Clinical Impressions Pt admitted for above, PTA pt lived with spouse who assisted her with ADLs/iADLs and occasionally assist with transfers. Pt with hx of dementia at baseline, she remains confused and follows commands inconsistently, requiring total A to min A for ADLs with some cues to sequence/initiate tasks and needing up to mod A + RW for gait. Strongly recommend +2 for line/lead and managing pt balance during gait progression. Session prolonged pt incontinence of urine and BM. OT to continue following pt while in acute setting to progress pt as able and help transition to next level of care. Patient will benefit from continued inpatient follow up therapy, <3 hours/day      If plan is discharge home, recommend the following:   A lot of help with walking and/or transfers;A lot of help with bathing/dressing/bathroom;Assistance with cooking/housework;Direct supervision/assist for financial management;Supervision due to cognitive status;Help with stairs or ramp for entrance;Direct supervision/assist for medications management     Functional Status Assessment   Patient has had a recent decline in their functional status and demonstrates the ability to make significant improvements in function in a reasonable and predictable amount of time.     Equipment Recommendations   BSC/3in1;Tub/shower seat     Recommendations for Other Services         Precautions/Restrictions   Precautions Precautions: Fall Recall of Precautions/Restrictions: Impaired Precaution/Restrictions Comments: incontinent of urine and possibly stool Restrictions Weight  Bearing Restrictions Per Provider Order: No     Mobility Bed Mobility Overal bed mobility: Needs Assistance Bed Mobility: Rolling, Sit to Supine Rolling: Mod assist     Sit to supine: Mod assist, Used rails   General bed mobility comments: mod A to roll completely for toileting, pt slow to initiate but can roll. Assist with returning BLEs to bed and cues to sequence.    Transfers Overall transfer level: Needs assistance Equipment used: Rolling walker (2 wheels) Transfers: Sit to/from Stand Sit to Stand: Mod assist           General transfer comment: STSx2 From recliner with mod A to boost into standing and steady. cues to shift hips anteriorly      Balance Overall balance assessment: Needs assistance Sitting-balance support: No upper extremity supported, Feet supported Sitting balance-Leahy Scale: Fair Sitting balance - Comments: EOB with CGA   Standing balance support: Bilateral upper extremity supported, During functional activity, Reliant on assistive device for balance Standing balance-Leahy Scale: Poor Standing balance comment: RW reliant                           ADL either performed or assessed with clinical judgement   ADL Overall ADL's : Needs assistance/impaired Eating/Feeding: Set up;Sitting   Grooming: Sitting;Minimal assistance   Upper Body Bathing: Minimal assistance;Sitting   Lower Body Bathing: Maximal assistance;Sitting/lateral leans   Upper Body Dressing : Moderate assistance;Sitting   Lower Body Dressing: Sitting/lateral leans;Total assistance Lower Body Dressing Details (indicate cue type and reason): doff/don socks Toilet Transfer: Moderate assistance;Rolling walker (2 wheels);BSC/3in1;Stand-pivot Toilet Transfer Details (indicate cue type and reason): safer with pivot tranfers. Toileting- Architect and Hygiene: Sit to/from stand;Moderate  assistance Toileting - Clothing Manipulation Details (indicate cue type and  reason): doff/don briefs, needs assist to steady balance     Functional mobility during ADLs: Moderate assistance;Rolling walker (2 wheels) General ADL Comments: assist to guide RW and maintain proximity to pt BOS     Vision         Perception         Praxis         Pertinent Vitals/Pain Pain Assessment Pain Assessment: Faces Faces Pain Scale: Hurts little more Pain Location: BLEs with touch Pain Descriptors / Indicators: Discomfort, Guarding, Tender Pain Intervention(s): Limited activity within patient's tolerance, Monitored during session     Extremity/Trunk Assessment Upper Extremity Assessment Upper Extremity Assessment: Generalized weakness   Lower Extremity Assessment Lower Extremity Assessment: Generalized weakness       Communication Communication Communication: No apparent difficulties   Cognition Arousal: Alert Behavior During Therapy: WFL for tasks assessed/performed Cognition: History of cognitive impairments             OT - Cognition Comments: hx of dementia at baseline.                         Cueing  General Comments      Pt initially on 2L with Sp02 96%, 95% on RA then following activity she remained at 89-91% RA, returned to 2L at end of session with Sp02 93%.   Exercises     Shoulder Instructions      Home Living Family/patient expects to be discharged to:: Skilled nursing facility                                        Prior Functioning/Environment Prior Level of Function : Independent/Modified Independent;Needs assist;History of Falls (last six months)  Cognitive Assist : Mobility (cognitive) Mobility (Cognitive): Intermittent cues   Physical Assist : Mobility (physical);ADLs (physical) Mobility (physical): Bed mobility;Transfers;Gait;Stairs ADLs (physical): Bathing;Dressing;IADLs Mobility Comments: assist required has been variable per husband.  Sometimes independent and sometimes required  assistance. ADLs Comments: husband would assist wtih ADLs and iADLs.    OT Problem List: Decreased cognition;Impaired balance (sitting and/or standing);Decreased strength;Cardiopulmonary status limiting activity;Pain   OT Treatment/Interventions: Self-care/ADL training;Therapeutic exercise;Patient/family education;Balance training;Therapeutic activities;DME and/or AE instruction      OT Goals(Current goals can be found in the care plan section)   Acute Rehab OT Goals Patient Stated Goal: To get better and eventually return home OT Goal Formulation: With family Time For Goal Achievement: 07/03/24 Potential to Achieve Goals: Fair ADL Goals Pt Will Perform Grooming: standing;with contact guard assist Pt Will Perform Lower Body Dressing: sit to/from stand;with contact guard assist Pt Will Transfer to Toilet: with contact guard assist;ambulating Pt Will Perform Toileting - Clothing Manipulation and hygiene: with contact guard assist;sit to/from stand   OT Frequency:  Min 2X/week    Co-evaluation              AM-PAC OT 6 Clicks Daily Activity     Outcome Measure Help from another person eating meals?: A Little Help from another person taking care of personal grooming?: A Little Help from another person toileting, which includes using toliet, bedpan, or urinal?: A Lot Help from another person bathing (including washing, rinsing, drying)?: A Lot Help from another person to put on and taking off regular upper body clothing?: A Lot Help from another person to put  on and taking off regular lower body clothing?: Total 6 Click Score: 13   End of Session Equipment Utilized During Treatment: Gait belt;Rolling walker (2 wheels) Nurse Communication: Mobility status  Activity Tolerance: Patient tolerated treatment well Patient left: in bed;with call bell/phone within reach;with bed alarm set;with family/visitor present  OT Visit Diagnosis: Unsteadiness on feet (R26.81);Other  abnormalities of gait and mobility (R26.89);Muscle weakness (generalized) (M62.81);Pain Pain - Right/Left:  (bilat) Pain - part of body: Leg                Time: 8676-8591 OT Time Calculation (min): 45 min Charges:  OT General Charges $OT Visit: 1 Visit OT Evaluation $OT Eval Moderate Complexity: 1 Mod OT Treatments $Self Care/Home Management : 8-22 mins $Therapeutic Activity: 8-22 mins  06/19/2024  AB, OTR/L  Acute Rehabilitation Services  Office: [phone]   [name] 06/19/2024, 2:44 PM

## 2024-06-19 NOTE — Evaluation (Signed)
 Physical Therapy Evaluation Patient Details Name: Phyllis Knight FULL MRN: [id number] DOB: 04-06-1941 Today's Date: 06/19/2024  History of Present Illness  Pt is an 83 yo female presenting to [facility] on 06/16/24 with SOB, possible septic shock secondary to UTI. PMH significant for HTN, Atrial Fibrillation on Eliquis , Dementia and HLD  Clinical Impression  Patient present with dependencies in gait and mobility due to generalized weakness, cognitive impairments and cardiopulmonary status.  Patient was variable with assistance required prior to admission but was able to function in the home with assistance.  Today patient requiring more assistance and incontinent of urine (which is new per husband)  Patient will benefit from continued inpatient follow up therapy, <3 hours/day. PT will continue to follow while in hospital.      If plan is discharge home, recommend the following: A little help with walking and/or transfers;A lot of help with bathing/dressing/bathroom;Assistance with cooking/housework;Direct supervision/assist for medications management;Help with stairs or ramp for entrance;Supervision due to cognitive status   Can travel by private vehicle   Yes    Equipment Recommendations None recommended by PT  Recommendations for Other Services       Functional Status Assessment Patient has had a recent decline in their functional status and demonstrates the ability to make significant improvements in function in a reasonable and predictable amount of time.     Precautions / Restrictions Precautions Precautions: Fall Recall of Precautions/Restrictions: Impaired Restrictions Weight Bearing Restrictions Per Provider Order: No      Mobility  Bed Mobility Overal bed mobility: Needs Assistance Bed Mobility: Supine to Sit     Supine to sit: Min assist          Transfers Overall transfer level: Needs assistance Equipment used: Rolling walker (2 wheels) Transfers: Sit to/from Stand, Bed  to chair/wheelchair/BSC Sit to Stand: Min assist   Step pivot transfers: Min assist       General transfer comment: pt incontinent of urine during transfer    Ambulation/Gait                  Stairs            Wheelchair Mobility     Tilt Bed    Modified Rankin (Stroke Patients Only)       Balance Overall balance assessment: Needs assistance Sitting-balance support: No upper extremity supported, Feet supported Sitting balance-Leahy Scale: Fair     Standing balance support: Bilateral upper extremity supported, During functional activity, Reliant on assistive device for balance Standing balance-Leahy Scale: Poor                               Pertinent Vitals/Pain Pain Assessment Pain Assessment: Faces Faces Pain Scale: Hurts little more Pain Location: legs Pain Descriptors / Indicators: Discomfort Pain Intervention(s): Limited activity within patient's tolerance, Monitored during session    Home Living Family/patient expects to be discharged to:: Skilled nursing facility                   Additional Comments: Spoke with husband, he would like pt to go to a facility to recieve more care but ultimately, would like to bring her home.    Prior Function Prior Level of Function : Independent/Modified Independent;Needs assist;History of Falls (last six months)  Cognitive Assist : Mobility (cognitive) Mobility (Cognitive): Intermittent cues   Physical Assist : Mobility (physical) Mobility (physical): Bed mobility;Transfers;Gait;Stairs   Mobility Comments: assist required has been variable  per husband.  Sometimes independent and sometimes required assistance.       Extremity/Trunk Assessment   Upper Extremity Assessment Upper Extremity Assessment: Defer to OT evaluation    Lower Extremity Assessment Lower Extremity Assessment: Generalized weakness       Communication   Communication Communication: No apparent  difficulties    Cognition Arousal: Alert Behavior During Therapy: WFL for tasks assessed/performed   PT - Cognitive impairments: History of cognitive impairments, Orientation, Awareness, Memory, Attention, Sequencing   Orientation impairments: Situation, Time                   PT - Cognition Comments: was able to name month (July) but not year.  Asking to go home when PT entered room. Following commands: Impaired Following commands impaired: Only follows one step commands consistently     Cueing Cueing Techniques: Verbal cues, Tactile cues     General Comments      Exercises     Assessment/Plan    PT Assessment Patient needs continued PT services  PT Problem List Decreased strength;Decreased activity tolerance;Decreased balance;Decreased mobility;Decreased cognition;Cardiopulmonary status limiting activity       PT Treatment Interventions DME instruction;Gait training;Functional mobility training;Therapeutic activities;Therapeutic exercise;Balance training;Patient/family education    PT Goals (Current goals can be found in the Care Plan section)  Acute Rehab PT Goals Patient Stated Goal: husband:  get her moving better PT Goal Formulation: With patient/family Time For Goal Achievement: 07/03/24 Potential to Achieve Goals: Good    Frequency Min 2X/week     Co-evaluation               AM-PAC PT 6 Clicks Mobility  Outcome Measure Help needed turning from your back to your side while in a flat bed without using bedrails?: A Little Help needed moving from lying on your back to sitting on the side of a flat bed without using bedrails?: A Little Help needed moving to and from a bed to a chair (including a wheelchair)?: A Little Help needed standing up from a chair using your arms (e.g., wheelchair or bedside chair)?: A Little Help needed to walk in hospital room?: A Lot Help needed climbing 3-5 steps with a railing? : A Lot 6 Click Score: 16    End of  Session Equipment Utilized During Treatment: Gait belt Activity Tolerance: Patient tolerated treatment well Patient left: in chair;with call bell/phone within reach;with chair alarm set;with family/visitor present Nurse Communication: Mobility status PT Visit Diagnosis: Unsteadiness on feet (R26.81);Other abnormalities of gait and mobility (R26.89);Repeated falls (R29.6);Muscle weakness (generalized) (M62.81);History of falling (Z91.81)    Time: 9042-8961 PT Time Calculation (min) (ACUTE ONLY): 41 min   Charges:   PT Evaluation $PT Eval Moderate Complexity: 1 Mod PT Treatments $Gait Training: 8-22 mins PT General Charges $$ ACUTE PT VISIT: 1 Visit         06/19/2024 Le[place], PT Acute Rehabilitation Services Office:  [phone]   Katharina Venus HERO 06/19/2024, 11:00 AM

## 2024-06-19 NOTE — Progress Notes (Signed)
 PROGRESS NOTE                                                                                                                                                                                                             Patient Demographics:    Phyllis Knight, is a 83 y.o. female, DOB - 10/15/1941, FMW:[id number]  Outpatient Primary MD for the patient is Claudene Pellet, MD    LOS - 3  Admit date - 06/16/2024    Chief Complaint  Patient presents with   Shortness of Breath       Brief Narrative (HPI from H&P)   83yo F with history of  Dementia, fibromyalgia, hx uterine cancer, afib on Eliquis , GERD, HLD . Presented with SOB, increased confusion, and dysuria x3 days. Was seen 7/5 for fall on Eliquis , found to have closed orbital fracture.   Reported decreased functional ability over the last year. Increased confusion and incontinence over the last week which is new for her per family.  In the ER she was diagnosed with acute hypoxic respiratory failure placed on BiPAP was diagnosed with CHF along with possible pneumonia admitted to ICU.  She was stabilized and transferred to my care on 06/19/2024 on day 3 of her hospital stay.   Significant Hospital Events: Including procedures, antibiotic start and stop dates in addition to other pertinent events    7/16 admitted to ICU, empiric Vancomycin  and Zosyn  started   Echocardiogram 7/17 showed EF 25-30%, moderately reduced RV systolic function, mildly elevated PA systolic pressure, mild MR, moderate TR.     Subjective:    Phyllis Knight today in bed, denies any headache chest or abdominal pain, still has a coarse cough and some shortness of breath.  She is somewhat confused.   Assessment  & Plan :    Acute hypoxic respiratory failure, mixed septic and cardiogenic shock.  Due to combination of acute on chronic systolic heart failure EF around 25% and pneumonia.  Previous EF 60% few  years ago.    Seen by PCCM and now cardiology, still has evidence of fluid overload and pulmonary edema, Lasix  60 mg IV on 06/19/2024, continue empiric IV antibiotics for pneumonia, continue supplemental oxygen, on beta-blocker, will try to add low-dose ACE inhibitor if blood pressure and creatinine permit, added pulmonary toiletry, flutter valve and I-S.  Continue to  monitor.  Follow cultures.   Paroxysmal atrial fibrillation [place] vas 2 score of greater than 3. Recently on amiodarone  drip, currently on beta-blocker and Eliquis , as needed IV Cardizem  added, blood pressure likely the limiting factor.  Dyslipidemia.  On statin continue.  Underlying dementia.  At risk for delirium/metabolic encephalopathy, continue Seroquel  bedtime and monitor, minimize narcotics and benzodiazepines  GERD.  PPI.      Condition - Extremely Guarded  Family Communication  :    Husband [name] on his cell phone [phone]  on 06/19/2024 at 9:14 AM, mailbox is full  Called daughter Corean [phone] on her cell phone on 06/19/2024 at 9:15 AM, message left  Called son [name] [phone]  on 06/19/2024 at 9:16 AM phone switched off, message left  Code Status : DNR  Consults  : PCCM, cardiology  PUD Prophylaxis :   Procedures  :          Disposition Plan  :    Status is: Inpatient   DVT Prophylaxis  :    SCDs Start: 06/16/24 1716 apixaban  (ELIQUIS ) tablet 5 mg    Lab Results  Component Value Date   PLT 108 (L) 06/19/2024    Diet :  Diet Order             Diet regular Room service appropriate? Yes; Fluid consistency: Thin  Diet effective now                    Inpatient Medications  Scheduled Meds:  apixaban   5 mg Oral BID   Chlorhexidine  Gluconate Cloth  6 each Topical Daily   feeding supplement  237 mL Oral BID BM   guaiFENesin   600 mg Oral BID   metoprolol  succinate  25 mg Oral Daily   multivitamin with minerals  1 tablet Oral Daily   mouth rinse  15 mL Mouth Rinse 4  times per day   oxidized cellulose  1 each Topical STAT   QUEtiapine   50 mg Oral QHS   rosuvastatin   20 mg Oral Daily   Continuous Infusions:  piperacillin -tazobactam (ZOSYN )  IV 3.375 g (06/19/24 0437)   PRN Meds:.acetaminophen , docusate sodium , ipratropium-albuterol , pantoprazole , phenol, polyethylene glycol    Objective:   Vitals:   06/19/24 0438 06/19/24 0749 06/19/24 0823 06/19/24 0903  BP:  (!) 160/102  (!) 118/108  Pulse:  97 92 98  Resp:  20 20   Temp:  98 F (36.7 C)    TempSrc:  Oral    SpO2:  100%    Weight: 85.4 kg       Wt Readings from Last 3 Encounters:  06/19/24 85.4 kg  06/05/24 72.6 kg  02/12/23 92.5 kg     Intake/Output Summary (Last 24 hours) at 06/19/2024 0912 Last data filed at 06/19/2024 0000 Gross per 24 hour  Intake 213.31 ml  Output 100 ml  Net 113.31 ml     Physical Exam  Awake mildly confused, no new F.N deficits, Normal affect [place].AT,PERRAL Supple Neck, No JVD,   Symmetrical Chest wall movement, Good air movement bilaterally, coarse bilateral breath sounds with coarse crackles all over RRR,No Gallops,Rubs or new Murmurs,  +ve B.Sounds, Abd Soft, No tenderness,   No Cyanosis, Clubbing or edema       Data Review:    Recent Labs  Lab 06/16/24 1255 06/16/24 1444 06/16/24 1517 06/16/24 2021 06/17/24 0420 06/18/24 0412 06/19/24 0549  WBC 3.5*  --   --   --  5.0 9.1 5.4  HGB 12.2 12.9 12.6  --  11.0* 11.3* 11.7*  HCT 38.0 38.0 37.0  --  34.1* 34.8* 36.3  PLT 119*  --   --  112* 100* 104* 108*  MCV 100.0  --   --   --  98.3 98.3 97.3  MCH 32.1  --   --   --  31.7 31.9 31.4  MCHC 32.1  --   --   --  32.3 32.5 32.2  RDW 15.0  --   --   --  14.8 14.9 14.9  LYMPHSABS 0.6*  --   --   --   --   --   --   MONOABS 0.3  --   --   --   --   --   --   EOSABS 0.0  --   --   --   --   --   --   BASOSABS 0.0  --   --   --   --   --   --     Recent Labs  Lab 06/16/24 1255 06/16/24 1305 06/16/24 1444 06/16/24 1445 06/16/24 1506  06/16/24 1514 06/16/24 1517 06/16/24 2021 06/17/24 0420 06/18/24 0412 06/19/24 0549  NA  --   --    < >  --  136  --  137  --  136 138 139  K  --   --    < >  --  3.8  --  4.0  --  3.5 3.5 3.5  CL  --   --   --   --  105  --   --   --  102 102 101  CO2  --   --   --   --  16*  --   --   --  21* 21* 24  ANIONGAP  --   --   --   --  15  --   --   --  13 15 14   GLUCOSE  --   --   --   --  120*  --   --   --  173* 107* 81  BUN  --   --   --   --  14  --   --   --  15 20 20   CREATININE  --   --   --   --  0.97  --   --   --  0.95 1.28* 1.09*  AST  --   --   --   --  250*  --   --   --  176* 126* 145*  ALT  --   --   --   --  103*  --   --   --  87* 86* 107*  ALKPHOS  --   --   --   --  70  --   --   --  50 48 59  BILITOT  --   --   --   --  2.1*  --   --   --  1.5* 1.6* 1.6*  ALBUMIN   --   --   --   --  3.0*  --   --   --  3.0* 3.3* 3.4*  CRP  --   --   --   --   --   --   --   --   --   --  8.1*  PROCALCITON  --   --   --   --   --   --   --  2.89 3.38 2.96  --   LATICACIDVEN  --   --   --  7.8*  --  6.4*  --  3.9* 2.0*  --   --   INR  --  1.8*  --   --   --   --   --  1.8*  --   --   --   BNP 3,064.7*  --   --   --   --   --   --   --  3,334.3*  --   --   MG  --   --   --   --   --   --   --   --  1.6* 2.4 2.3  PHOS  --   --   --   --   --   --   --   --  3.5 3.4 2.9  CALCIUM   --   --   --   --  8.5*  --   --   --  8.7* 8.5* 8.9   < > = values in this interval not displayed.      Recent Labs  Lab 06/16/24 1255 06/16/24 1305 06/16/24 1445 06/16/24 1506 06/16/24 1514 06/16/24 2021 06/17/24 0420 06/18/24 0412 06/19/24 0549  CRP  --   --   --   --   --   --   --   --  8.1*  PROCALCITON  --   --   --   --   --  2.89 3.38 2.96  --   LATICACIDVEN  --   --  7.8*  --  6.4* 3.9* 2.0*  --   --   INR  --  1.8*  --   --   --  1.8*  --   --   --   BNP 3,064.7*  --   --   --   --   --  3,334.3*  --   --   MG  --   --   --   --   --   --  1.6* 2.4 2.3  CALCIUM   --   --   --  8.5*  --    --  8.7* 8.5* 8.9    --------------------------------------------------------------------------------------------------------------- Lab Results  Component Value Date   CHOL 207 (H) 12/15/2019   HDL 47 12/15/2019   LDLCALC 139 (H) 12/15/2019   LDLDIRECT 47 06/16/2024   TRIG 119 12/15/2019   CHOLHDL 4.4 12/15/2019   Radiology Report DG Chest Port 1 View Result Date: 06/19/2024 CLINICAL DATA:  Shortness of breath EXAM: PORTABLE CHEST 1 VIEW COMPARISON:  06/16/2024 FINDINGS: Unchanged cardiac enlargement prominent pulmonary arteries suggest PA hypertension. Aortic atherosclerosis. Pulmonary vascular congestion with mildly increased lower lobe interstitial markings. Similar appearance of left midlung and bibasilar atelectasis. IMPRESSION: 1. Cardiomegaly and pulmonary vascular congestion. Mild lower lung zone interstitial edema. 2. Left midlung and bibasilar atelectasis. Electronically Signed   By: Waddell Calk M.D.   On: 06/19/2024 07:19   ECHOCARDIOGRAM COMPLETE Result Date: 06/17/2024    ECHOCARDIOGRAM REPORT   Patient Name:   Phyllis Knight Date of Exam: 06/17/2024 Medical Rec #:  [id number]        Height:       69.0 in Accession #:    [id number]       Weight:       188.5 lb Date of Birth:  06/23/1941        BSA:  2.015 m Patient Age:    82 years         BP:           110/84 mmHg Patient Gender: F                HR:           104 bpm. Exam Location:  Inpatient Procedure: 2D Echo, Cardiac Doppler, Color Doppler and Intracardiac            Opacification Agent (Both Spectral and Color Flow Doppler were            utilized during procedure). Indications:    Atrial Fibrillation  History:        Patient has prior history of Echocardiogram examinations.                 Arrythmias:Atrial Fibrillation and PVC; Risk                 Factors:Hypertension.  Sonographer:    Christiana Mbomeh Referring Phys: [id number] TORIBIO JAYSON SHARPS IMPRESSIONS  1. Left ventricular ejection fraction, by estimation, is 25  to 30%. The left ventricle has severely decreased function. The left ventricle demonstrates global hypokinesis. Left ventricular diastolic function could not be evaluated.  2. Right ventricular systolic function is moderately reduced. The right ventricular size is moderately enlarged. There is mildly elevated pulmonary artery systolic pressure. The estimated right ventricular systolic pressure is 44.6 mmHg.  3. Left atrial size was mildly dilated.  4. Right atrial size was mildly dilated.  5. The mitral valve is normal in structure. Mild mitral valve regurgitation. No evidence of mitral stenosis.  6. Tricuspid valve regurgitation is moderate.  7. The aortic valve is normal in structure. Aortic valve regurgitation is mild. No aortic stenosis is present.  8. The inferior vena cava is dilated in size with <50% respiratory variability, suggesting right atrial pressure of 15 mmHg. FINDINGS  Left Ventricle: Left ventricular ejection fraction, by estimation, is 25 to 30%. The left ventricle has severely decreased function. The left ventricle demonstrates global hypokinesis. Definity  contrast agent was given IV to delineate the left ventricular endocardial borders. The left ventricular internal cavity size was normal in size. There is no left ventricular hypertrophy. Left ventricular diastolic function could not be evaluated due to atrial fibrillation. Left ventricular diastolic function could not be evaluated. Right Ventricle: The right ventricular size is moderately enlarged. No increase in right ventricular wall thickness. Right ventricular systolic function is moderately reduced. There is mildly elevated pulmonary artery systolic pressure. The tricuspid regurgitant velocity is 2.72 m/s, and with an assumed right atrial pressure of 15 mmHg, the estimated right ventricular systolic pressure is 44.6 mmHg. Left Atrium: Left atrial size was mildly dilated. Right Atrium: Right atrial size was mildly dilated. Pericardium:  There is no evidence of pericardial effusion. Mitral Valve: The mitral valve is normal in structure. Mild mitral valve regurgitation. No evidence of mitral valve stenosis. Tricuspid Valve: The tricuspid valve is normal in structure. Tricuspid valve regurgitation is moderate . No evidence of tricuspid stenosis. Aortic Valve: The aortic valve is normal in structure. Aortic valve regurgitation is mild. No aortic stenosis is present. Aortic valve mean gradient measures 3.7 mmHg. Aortic valve peak gradient measures 6.7 mmHg. Aortic valve area, by VTI measures 1.49 cm. Pulmonic Valve: The pulmonic valve was normal in structure. Pulmonic valve regurgitation is not visualized. No evidence of pulmonic stenosis. Aorta: The aortic root is normal in size and structure. Venous: The  inferior vena cava is dilated in size with less than 50% respiratory variability, suggesting right atrial pressure of 15 mmHg. IAS/Shunts: No atrial level shunt detected by color flow Doppler.  LEFT VENTRICLE PLAX 2D LVIDd:         5.10 cm LVIDs:         4.30 cm LV PW:         0.90 cm LV IVS:        1.10 cm LVOT diam:     1.80 cm LV SV:         28 LV SV Index:   14 LVOT Area:     2.54 cm  LV Volumes (MOD) LV vol d, MOD A2C: 119.0 ml LV vol d, MOD A4C: 113.0 ml LV vol s, MOD A2C: 70.8 ml LV vol s, MOD A4C: 84.5 ml LV SV MOD A2C:     48.2 ml LV SV MOD A4C:     113.0 ml LV SV MOD BP:      39.8 ml RIGHT VENTRICLE TAPSE (M-mode): 1.4 cm LEFT ATRIUM             Index        RIGHT ATRIUM           Index LA diam:        3.80 cm 1.89 cm/m   RA Area:     20.60 cm LA Vol (A2C):   68.1 ml 33.80 ml/m  RA Volume:   64.70 ml  32.12 ml/m LA Vol (A4C):   67.5 ml 33.51 ml/m LA Biplane Vol: 69.8 ml 34.65 ml/m  AORTIC VALVE AV Area (Vmax):    1.59 cm AV Area (Vmean):   1.57 cm AV Area (VTI):     1.49 cm AV Vmax:           129.67 cm/s AV Vmean:          90.367 cm/s AV VTI:            0.188 m AV Peak Grad:      6.7 mmHg AV Mean Grad:      3.7 mmHg LVOT Vmax:          80.77 cm/s LVOT Vmean:        55.733 cm/s LVOT VTI:          0.110 m LVOT/AV VTI ratio: 0.58  AORTA Ao Root diam: 2.80 cm Ao Asc diam:  3.20 cm TRICUSPID VALVE TR Peak grad:   29.6 mmHg TR Vmax:        272.00 cm/s  SHUNTS Systemic VTI:  0.11 m Systemic Diam: 1.80 cm Morene Brownie Electronically signed by Morene Brownie Signature Date/Time: 06/17/2024/5:17:56 PM    Final      Signature  -   Lavada Stank M.D on 06/19/2024 at 9:12 AM   -  To page go to [email or web address]

## 2024-06-19 NOTE — Plan of Care (Signed)
  Problem: Clinical Measurements: Goal: Ability to maintain clinical measurements within normal limits will improve Outcome: Progressing Goal: Will remain free from infection Outcome: Progressing Goal: Respiratory complications will improve Outcome: Progressing   Problem: Activity: Goal: Risk for activity intolerance will decrease Outcome: Progressing   Problem: Safety: Goal: Ability to remain free from injury will improve Outcome: Progressing

## 2024-06-20 DIAGNOSIS — I480 Paroxysmal atrial fibrillation: Secondary | ICD-10-CM | POA: Diagnosis not present

## 2024-06-20 DIAGNOSIS — I5021 Acute systolic (congestive) heart failure: Secondary | ICD-10-CM | POA: Diagnosis not present

## 2024-06-20 DIAGNOSIS — R579 Shock, unspecified: Secondary | ICD-10-CM | POA: Diagnosis not present

## 2024-06-20 LAB — COMPREHENSIVE METABOLIC PANEL WITH GFR
ALT: 81 U/L — ABNORMAL HIGH (ref 0–44)
AST: 98 U/L — ABNORMAL HIGH (ref 15–41)
Albumin: 2.8 g/dL — ABNORMAL LOW (ref 3.5–5.0)
Alkaline Phosphatase: 53 U/L (ref 38–126)
Anion gap: 12 (ref 5–15)
BUN: 13 mg/dL (ref 8–23)
CO2: 25 mmol/L (ref 22–32)
Calcium: 8.4 mg/dL — ABNORMAL LOW (ref 8.9–10.3)
Chloride: 103 mmol/L (ref 98–111)
Creatinine, Ser: 0.82 mg/dL (ref 0.44–1.00)
GFR, Estimated: >60 mL/min (ref >60)
Glucose, Bld: 81 mg/dL (ref 70–99)
Potassium: 3 mmol/L — ABNORMAL LOW (ref 3.5–5.1)
Sodium: 140 mmol/L (ref 135–145)
Total Bilirubin: 1.9 mg/dL — ABNORMAL HIGH (ref 0.0–1.2)
Total Protein: 6.3 g/dL — ABNORMAL LOW (ref 6.5–8.1)

## 2024-06-20 LAB — GLUCOSE, CAPILLARY: Glucose-Capillary: 72 mg/dL (ref 70–99)

## 2024-06-20 LAB — CBC
HCT: 33.9 % — ABNORMAL LOW (ref 36.0–46.0)
Hemoglobin: 10.7 g/dL — ABNORMAL LOW (ref 12.0–15.0)
MCH: 31.2 pg (ref 26.0–34.0)
MCHC: 31.6 g/dL (ref 30.0–36.0)
MCV: 98.8 fL (ref 80.0–100.0)
Platelets: 114 K/uL — ABNORMAL LOW (ref 150–400)
RBC: 3.43 MIL/uL — ABNORMAL LOW (ref 3.87–5.11)
RDW: 14.6 % (ref 11.5–15.5)
WBC: 4.3 K/uL (ref 4.0–10.5)
nRBC: 0 % (ref 0.0–0.2)

## 2024-06-20 LAB — PHOSPHORUS: Phosphorus: 3.8 mg/dL (ref 2.5–4.6)

## 2024-06-20 LAB — MAGNESIUM: Magnesium: 1.8 mg/dL (ref 1.7–2.4)

## 2024-06-20 MED ORDER — MAGIC MOUTHWASH W/LIDOCAINE
5.0000 mL | Freq: Four times a day (QID) | ORAL | Status: DC | PRN
  Administered 2024-06-21 (×2): 5 mL via ORAL
  Filled 2024-06-20 (×4): qty 5

## 2024-06-20 MED ORDER — FUROSEMIDE 10 MG/ML IJ SOLN
INTRAMUSCULAR | Status: AC
  Filled 2024-06-20: qty 8

## 2024-06-20 MED ORDER — MAGNESIUM SULFATE IN D5W 1-5 GM/100ML-% IV SOLN
1.0000 g | Freq: Once | INTRAVENOUS | Status: AC
  Administered 2024-06-20: 1 g via INTRAVENOUS
  Filled 2024-06-20: qty 100

## 2024-06-20 MED ORDER — QUETIAPINE FUMARATE 25 MG PO TABS
25.0000 mg | ORAL_TABLET | Freq: Every day | ORAL | Status: DC
  Administered 2024-06-20: 25 mg via ORAL
  Filled 2024-06-20: qty 1

## 2024-06-20 MED ORDER — POTASSIUM CHLORIDE CRYS ER 20 MEQ PO TBCR
40.0000 meq | EXTENDED_RELEASE_TABLET | ORAL | Status: AC
  Administered 2024-06-20 (×3): 40 meq via ORAL
  Filled 2024-06-20 (×3): qty 2

## 2024-06-20 MED ORDER — POTASSIUM CHLORIDE 10 MEQ/100ML IV SOLN
10.0000 meq | INTRAVENOUS | Status: DC
  Administered 2024-06-20 (×2): 10 meq via INTRAVENOUS
  Filled 2024-06-20 (×2): qty 100

## 2024-06-20 MED ORDER — FUROSEMIDE 10 MG/ML IJ SOLN
80.0000 mg | Freq: Once | INTRAMUSCULAR | Status: AC
  Administered 2024-06-20: 80 mg via INTRAVENOUS
  Filled 2024-06-20: qty 8

## 2024-06-20 NOTE — Plan of Care (Signed)

## 2024-06-20 NOTE — Plan of Care (Signed)
  Problem: Clinical Measurements: Goal: Ability to maintain clinical measurements within normal limits will improve Outcome: Progressing Goal: Will remain free from infection Outcome: Progressing   Problem: Activity: Goal: Risk for activity intolerance will decrease Outcome: Progressing   Problem: Safety: Goal: Ability to remain free from injury will improve Outcome: Progressing   

## 2024-06-20 NOTE — Progress Notes (Signed)
  Progress Note  Patient Name: Phyllis Knight Date of Encounter: 06/20/2024 [facility] Cardiologist: Newman [name], MD   Interval Summary   Still overloaded  Vital Signs Vitals:   06/19/24 2309 06/20/24 0400 06/20/24 0430 06/20/24 0910  BP: (!) 136/95 131/87  (!) 143/78  Pulse: 90 87  89  Resp: 18 18  16   Temp: 97.6 F (36.4 C) 97.6 F (36.4 C)  97.6 F (36.4 C)  TempSrc: Axillary Axillary  Axillary  SpO2: 99% 100%  100%  Weight:   80.5 kg     Intake/Output Summary (Last 24 hours) at 06/20/2024 1037 Last data filed at 06/20/2024 0400 Gross per 24 hour  Intake 60 ml  Output 1600 ml  Net -1540 ml      06/20/2024    4:30 AM 06/19/2024    4:38 AM 06/18/2024    4:10 AM  Last 3 Weights  Weight (lbs) 177 lb 7.5 oz 188 lb 4.4 oz 182 lb 8.7 oz  Weight (kg) 80.5 kg 85.4 kg 82.8 kg      Physical Exam  GEN: No acute distress.   Neck: No JVD Cardiac: RRR, no murmurs, rubs, or gallops.  Respiratory: Clear to auscultation bilaterally. GI: Soft, nontender, non-distended  MS: No edema  Assessment & Plan   Acute systolic heart failure EF 25%, previously 60% 5 years ago -Continue with IV Lasix  80 this AM -On antibiotics for pneumonia empirically -Spironolactone  12.5 mg a day  Paroxysmal atrial fibrillation - Currently on beta-blocker as well as Eliquis .  Dyslipidemia - Statin  Dementia - Per primary team.  DNR  For questions or updates, please contact [facility] Please consult [email or web address] for contact info under       Signed, Oneil Parchment, MD

## 2024-06-20 NOTE — Progress Notes (Signed)
 PROGRESS NOTE                                                                                                                                                                                                             Patient Demographics:    Phyllis Knight, is a 83 y.o. female, DOB - Jun 02, 1941, FMW:[id number]  Outpatient Primary MD for the patient is Claudene Pellet, MD    LOS - 4  Admit date - 06/16/2024    Chief Complaint  Patient presents with   Shortness of Breath       Brief Narrative (HPI from H&P)   83yo F with history of  Dementia, fibromyalgia, hx uterine cancer, afib on Eliquis , GERD, HLD . Presented with SOB, increased confusion, and dysuria x3 days. Was seen 7/5 for fall on Eliquis , found to have closed orbital fracture.   Reported decreased functional ability over the last year. Increased confusion and incontinence over the last week which is new for her per family.  In the ER she was diagnosed with acute hypoxic respiratory failure placed on BiPAP was diagnosed with CHF along with possible pneumonia admitted to ICU.  She was stabilized and transferred to my care on 06/19/2024 on day 3 of her hospital stay.   Significant Hospital Events: Including procedures, antibiotic start and stop dates in addition to other pertinent events    7/16 admitted to ICU, empiric Vancomycin  and Zosyn  started   Echocardiogram 7/17 showed EF 25-30%, moderately reduced RV systolic function, mildly elevated PA systolic pressure, mild MR, moderate TR.     Subjective:   Patient in bed, appears comfortable, denies any headache, no fever, no chest pain or pressure, no shortness of breath , no abdominal pain. No new focal weakness.    Assessment  & Plan :    Acute hypoxic respiratory failure, mixed septic and cardiogenic shock.  Due to combination of acute on chronic systolic heart failure EF around 25% and pneumonia.  Previous EF  60% few years ago.    Seen by PCCM and now cardiology, still has evidence of fluid overload and pulmonary edema, Lasix  60 mg IV on 06/20/2024, continue empiric IV antibiotics for pneumonia, continue supplemental oxygen, on beta-blocker, will try to add low-dose ACE inhibitor if blood pressure and creatinine permit, added pulmonary toiletry, flutter valve and I-S.  Continue to  monitor.  Follow cultures.   Paroxysmal atrial fibrillation [place] vas 2 score of greater than 3. Recently on amiodarone  drip, currently on beta-blocker and Eliquis , as needed IV Cardizem  added, blood pressure likely the limiting factor.  Dyslipidemia.  On statin continue.  Underlying dementia.  At risk for delirium/metabolic encephalopathy, continue Seroquel  bedtime and monitor, minimize narcotics and benzodiazepines  Hypokalemia.  Replace.    GERD.  PPI.      Condition - Extremely Guarded  Family Communication  :   Husband [name] on his cell phone [phone]  on 06/19/2024 at 9:14 AM, mailbox is full  Called daughter Corean [phone] on her cell phone on 06/19/2024 at 9:15 AM, message left  Called son [name] [phone]  on 06/19/2024, 06/20/2024  Code Status : DNR  Consults  : PCCM, cardiology  PUD Prophylaxis :   Procedures  :          Disposition Plan  :    Status is: Inpatient   DVT Prophylaxis  :    SCDs Start: 06/16/24 1716 apixaban  (ELIQUIS ) tablet 5 mg    Lab Results  Component Value Date   PLT 114 (L) 06/20/2024    Diet :  Diet Order             Diet regular Room service appropriate? Yes; Fluid consistency: Thin  Diet effective now                    Inpatient Medications  Scheduled Meds:  apixaban   5 mg Oral BID   Chlorhexidine  Gluconate Cloth  6 each Topical Daily   feeding supplement  237 mL Oral BID BM   furosemide   80 mg Intravenous Once   guaiFENesin   600 mg Oral BID   isosorbide  mononitrate  15 mg Oral Daily   metoprolol  succinate  25 mg Oral Daily    multivitamin with minerals  1 tablet Oral Daily   mouth rinse  15 mL Mouth Rinse 4 times per day   QUEtiapine   25 mg Oral QHS   rosuvastatin   20 mg Oral Daily   spironolactone   12.5 mg Oral Daily   Continuous Infusions:  magnesium  sulfate bolus IVPB     potassium chloride      PRN Meds:.acetaminophen , docusate sodium , haloperidol  lactate, ipratropium-albuterol , pantoprazole , phenol, polyethylene glycol    Objective:   Vitals:   06/19/24 2309 06/20/24 0400 06/20/24 0430 06/20/24 0910  BP: (!) 136/95 131/87  (!) 143/78  Pulse: 90 87  89  Resp: 18 18  16   Temp: 97.6 F (36.4 C) 97.6 F (36.4 C)  97.6 F (36.4 C)  TempSrc: Axillary Axillary  Axillary  SpO2: 99% 100%  100%  Weight:   80.5 kg     Wt Readings from Last 3 Encounters:  06/20/24 80.5 kg  06/05/24 72.6 kg  02/12/23 92.5 kg     Intake/Output Summary (Last 24 hours) at 06/20/2024 0925 Last data filed at 06/20/2024 0400 Gross per 24 hour  Intake 60 ml  Output 1600 ml  Net -1540 ml     Physical Exam  Awake mildly confused, no new F.N deficits, Normal affect [place].AT,PERRAL Supple Neck, No JVD,   Symmetrical Chest wall movement, Good air movement bilaterally, coarse bilateral breath sounds with coarse crackles all over RRR,No Gallops,Rubs or new Murmurs,  +ve B.Sounds, Abd Soft, No tenderness,   No Cyanosis, Clubbing or edema       Data Review:    Recent Labs  Lab 06/16/24 1255 06/16/24 1444  06/16/24 1517 06/16/24 2021 06/17/24 0420 06/18/24 0412 06/19/24 0549 06/20/24 0709  WBC 3.5*  --   --   --  5.0 9.1 5.4 4.3  HGB 12.2   < > 12.6  --  11.0* 11.3* 11.7* 10.7*  HCT 38.0   < > 37.0  --  34.1* 34.8* 36.3 33.9*  PLT 119*  --   --  112* 100* 104* 108* 114*  MCV 100.0  --   --   --  98.3 98.3 97.3 98.8  MCH 32.1  --   --   --  31.7 31.9 31.4 31.2  MCHC 32.1  --   --   --  32.3 32.5 32.2 31.6  RDW 15.0  --   --   --  14.8 14.9 14.9 14.6  LYMPHSABS 0.6*  --   --   --   --   --   --   --   MONOABS  0.3  --   --   --   --   --   --   --   EOSABS 0.0  --   --   --   --   --   --   --   BASOSABS 0.0  --   --   --   --   --   --   --    < > = values in this interval not displayed.    Recent Labs  Lab 06/16/24 1255 06/16/24 1305 06/16/24 1444 06/16/24 1445 06/16/24 1506 06/16/24 1514 06/16/24 1517 06/16/24 2021 06/17/24 0420 06/18/24 0412 06/19/24 0549 06/19/24 0559 06/20/24 0709  NA  --   --    < >  --  136  --  137  --  136 138 139  --  140  K  --   --    < >  --  3.8  --  4.0  --  3.5 3.5 3.5  --  3.0*  CL  --   --   --   --  105  --   --   --  102 102 101  --  103  CO2  --   --   --   --  16*  --   --   --  21* 21* 24  --  25  ANIONGAP  --   --   --   --  15  --   --   --  13 15 14   --  12  GLUCOSE  --   --   --   --  120*  --   --   --  173* 107* 81  --  81  BUN  --   --   --   --  14  --   --   --  15 20 20   --  13  CREATININE  --   --   --   --  0.97  --   --   --  0.95 1.28* 1.09*  --  0.82  AST  --   --   --   --  250*  --   --   --  176* 126* 145*  --  98*  ALT  --   --   --   --  103*  --   --   --  87* 86* 107*  --  81*  ALKPHOS  --   --   --   --  70  --   --   --  50 48 59  --  53  BILITOT  --   --   --   --  2.1*  --   --   --  1.5* 1.6* 1.6*  --  1.9*  ALBUMIN   --   --   --   --  3.0*  --   --   --  3.0* 3.3* 3.4*  --  2.8*  CRP  --   --   --   --   --   --   --   --   --   --  8.1*  --   --   PROCALCITON  --   --   --   --   --   --   --  2.89 3.38 2.96 1.49  --   --   LATICACIDVEN  --   --   --  7.8*  --  6.4*  --  3.9* 2.0*  --   --   --   --   INR  --  1.8*  --   --   --   --   --  1.8*  --   --   --   --   --   BNP 3,064.7*  --   --   --   --   --   --   --  3,334.3*  --   --  1,843.1*  --   MG  --   --   --   --   --   --   --   --  1.6* 2.4 2.3  --  1.8  PHOS  --   --   --   --   --   --   --   --  3.5 3.4 2.9  --  3.8  CALCIUM   --   --   --   --  8.5*  --   --   --  8.7* 8.5* 8.9  --  8.4*   < > = values in this interval not displayed.       Recent Labs  Lab 06/16/24 1255 06/16/24 1305 06/16/24 1445 06/16/24 1506 06/16/24 1514 06/16/24 2021 06/17/24 0420 06/18/24 0412 06/19/24 0549 06/19/24 0559 06/20/24 0709  CRP  --   --   --   --   --   --   --   --  8.1*  --   --   PROCALCITON  --   --   --   --   --  2.89 3.38 2.96 1.49  --   --   LATICACIDVEN  --   --  7.8*  --  6.4* 3.9* 2.0*  --   --   --   --   INR  --  1.8*  --   --   --  1.8*  --   --   --   --   --   BNP 3,064.7*  --   --   --   --   --  3,334.3*  --   --  1,843.1*  --   MG  --   --   --   --   --   --  1.6* 2.4 2.3  --  1.8  CALCIUM   --   --   --  8.5*  --   --  8.7* 8.5* 8.9  --  8.4*    --------------------------------------------------------------------------------------------------------------- Lab Results  Component Value Date   CHOL 207 (H)  12/15/2019   HDL 47 12/15/2019   LDLCALC 139 (H) 12/15/2019   LDLDIRECT 47 06/16/2024   TRIG 119 12/15/2019   CHOLHDL 4.4 12/15/2019   Radiology Report DG Chest Port 1 View Result Date: 06/19/2024 CLINICAL DATA:  Shortness of breath EXAM: PORTABLE CHEST 1 VIEW COMPARISON:  06/16/2024 FINDINGS: Unchanged cardiac enlargement prominent pulmonary arteries suggest PA hypertension. Aortic atherosclerosis. Pulmonary vascular congestion with mildly increased lower lobe interstitial markings. Similar appearance of left midlung and bibasilar atelectasis. IMPRESSION: 1. Cardiomegaly and pulmonary vascular congestion. Mild lower lung zone interstitial edema. 2. Left midlung and bibasilar atelectasis. Electronically Signed   By: Waddell Calk M.D.   On: 06/19/2024 07:19     Signature  -   Lavada Stank M.D on 06/20/2024 at 9:25 AM   -  To page go to [email or web address]

## 2024-06-21 DIAGNOSIS — Z515 Encounter for palliative care: Secondary | ICD-10-CM | POA: Diagnosis not present

## 2024-06-21 DIAGNOSIS — I4891 Unspecified atrial fibrillation: Secondary | ICD-10-CM | POA: Diagnosis not present

## 2024-06-21 DIAGNOSIS — I5041 Acute combined systolic (congestive) and diastolic (congestive) heart failure: Secondary | ICD-10-CM

## 2024-06-21 DIAGNOSIS — Z7189 Other specified counseling: Secondary | ICD-10-CM | POA: Diagnosis not present

## 2024-06-21 DIAGNOSIS — J122 Parainfluenza virus pneumonia: Secondary | ICD-10-CM

## 2024-06-21 DIAGNOSIS — R579 Shock, unspecified: Secondary | ICD-10-CM | POA: Diagnosis not present

## 2024-06-21 LAB — CBC
HCT: 36.5 % (ref 36.0–46.0)
Hemoglobin: 11.6 g/dL — ABNORMAL LOW (ref 12.0–15.0)
MCH: 31.4 pg (ref 26.0–34.0)
MCHC: 31.8 g/dL (ref 30.0–36.0)
MCV: 98.9 fL (ref 80.0–100.0)
Platelets: 174 K/uL (ref 150–400)
RBC: 3.69 MIL/uL — ABNORMAL LOW (ref 3.87–5.11)
RDW: 14.5 % (ref 11.5–15.5)
WBC: 5 K/uL (ref 4.0–10.5)
nRBC: 0 % (ref 0.0–0.2)

## 2024-06-21 LAB — COMPREHENSIVE METABOLIC PANEL WITH GFR
ALT: 74 U/L — ABNORMAL HIGH (ref 0–44)
AST: 75 U/L — ABNORMAL HIGH (ref 15–41)
Albumin: 2.9 g/dL — ABNORMAL LOW (ref 3.5–5.0)
Alkaline Phosphatase: 67 U/L (ref 38–126)
Anion gap: 10 (ref 5–15)
BUN: 10 mg/dL (ref 8–23)
CO2: 31 mmol/L (ref 22–32)
Calcium: 9 mg/dL (ref 8.9–10.3)
Chloride: 99 mmol/L (ref 98–111)
Creatinine, Ser: 0.78 mg/dL (ref 0.44–1.00)
GFR, Estimated: >60 mL/min (ref >60)
Glucose, Bld: 96 mg/dL (ref 70–99)
Potassium: 4.6 mmol/L (ref 3.5–5.1)
Sodium: 140 mmol/L (ref 135–145)
Total Bilirubin: 1.3 mg/dL — ABNORMAL HIGH (ref 0.0–1.2)
Total Protein: 6.8 g/dL (ref 6.5–8.1)

## 2024-06-21 LAB — CULTURE, BLOOD (ROUTINE X 2)
Culture: NO GROWTH
Culture: NO GROWTH
Special Requests: ADEQUATE

## 2024-06-21 LAB — PHOSPHORUS: Phosphorus: 3.1 mg/dL (ref 2.5–4.6)

## 2024-06-21 LAB — TSH: TSH: 4.112 u[IU]/mL (ref 0.350–4.500)

## 2024-06-21 LAB — MAGNESIUM: Magnesium: 2 mg/dL (ref 1.7–2.4)

## 2024-06-21 MED ORDER — AMIODARONE HCL IN DEXTROSE 360-4.14 MG/200ML-% IV SOLN
30.0000 mg/h | INTRAVENOUS | Status: DC
  Administered 2024-06-21 – 2024-06-23 (×5): 30 mg/h via INTRAVENOUS
  Filled 2024-06-21 (×5): qty 200

## 2024-06-21 MED ORDER — AMIODARONE LOAD VIA INFUSION
150.0000 mg | Freq: Once | INTRAVENOUS | Status: AC
  Administered 2024-06-21: 150 mg via INTRAVENOUS
  Filled 2024-06-21: qty 83.34

## 2024-06-21 MED ORDER — AMIODARONE HCL IN DEXTROSE 360-4.14 MG/200ML-% IV SOLN
60.0000 mg/h | INTRAVENOUS | Status: DC
  Administered 2024-06-21 (×2): 60 mg/h via INTRAVENOUS
  Filled 2024-06-21: qty 200

## 2024-06-21 MED ORDER — FUROSEMIDE 10 MG/ML IJ SOLN
60.0000 mg | Freq: Once | INTRAMUSCULAR | Status: AC
  Administered 2024-06-21: 60 mg via INTRAVENOUS
  Filled 2024-06-21: qty 6

## 2024-06-21 MED ORDER — POTASSIUM CHLORIDE CRYS ER 20 MEQ PO TBCR
40.0000 meq | EXTENDED_RELEASE_TABLET | Freq: Once | ORAL | Status: AC
  Administered 2024-06-21: 40 meq via ORAL
  Filled 2024-06-21: qty 2

## 2024-06-21 MED ORDER — SPIRONOLACTONE 25 MG PO TABS
25.0000 mg | ORAL_TABLET | Freq: Every day | ORAL | Status: DC
  Administered 2024-06-21 – 2024-06-22 (×2): 25 mg via ORAL
  Filled 2024-06-21 (×2): qty 1

## 2024-06-21 MED ORDER — QUETIAPINE FUMARATE 25 MG PO TABS
12.5000 mg | ORAL_TABLET | Freq: Every day | ORAL | Status: DC
  Administered 2024-06-21 – 2024-06-24 (×4): 12.5 mg via ORAL
  Filled 2024-06-21 (×4): qty 1

## 2024-06-21 NOTE — Progress Notes (Deleted)
   06/21/24 0313  Vitals  Temp 97.7 F (36.5 C)  Temp Source Axillary  BP 114/81  MAP (mmHg) 93  BP Location Right Arm  BP Method Automatic  Patient Position (if appropriate) Lying  Pulse Rate (!) 115  Pulse Rate Source Monitor  ECG Heart Rate (!) 112  Resp (!) 21  MEWS COLOR  MEWS Score Color Yellow  Oxygen Therapy  SpO2 100 %  O2 Device Nasal Cannula  O2 Flow Rate (L/min) 2 L/min  MEWS Score  MEWS Temp 0  MEWS Systolic 0  MEWS Pulse 2  MEWS RR 1  MEWS LOC 0  MEWS Score 3

## 2024-06-21 NOTE — Progress Notes (Signed)
   06/21/24 0313  Assess: MEWS Score  Temp 97.7 F (36.5 C)  BP 114/81  MAP (mmHg) 93  Pulse Rate (!) 115  ECG Heart Rate (!) 112  Resp (!) 21  SpO2 100 %  O2 Device Nasal Cannula  O2 Flow Rate (L/min) 2 L/min  Assess: MEWS Score  MEWS Temp 0  MEWS Systolic 0  MEWS Pulse 2  MEWS RR 1  MEWS LOC 0  MEWS Score 3  MEWS Score Color Yellow  Assess: if the MEWS score is Yellow or Red  Were vital signs accurate and taken at a resting state? Yes  Does the patient meet 2 or more of the SIRS criteria? Yes  Does the patient have a confirmed or suspected source of infection? No  MEWS guidelines implemented  Yes, yellow  Treat  MEWS Interventions Considered administering scheduled or prn medications/treatments as ordered  Take Vital Signs  Increase Vital Sign Frequency  Yellow: Q2hr x1, continue Q4hrs until patient remains green for 12hrs  Escalate  MEWS: Escalate Yellow: Discuss with charge nurse and consider notifying provider and/or RRT  Notify: Charge Nurse/RN  Name of Charge Nurse/RN Notified Nikki,RN  Assess: SIRS CRITERIA  SIRS Temperature  0  SIRS Respirations  1  SIRS Pulse 1  SIRS WBC 0  SIRS Score Sum  2

## 2024-06-21 NOTE — Progress Notes (Signed)
   Palliative Medicine Inpatient Follow Up Note HPI: 83 y.o. female  with past medical history of dementia, fibromyalgia, hx uterine cancer, afib on Eliquis , GERD, and HLD admitted on 06/16/2024 with new onset shortness of breath.  Diagnosed with mixed septic and cardiogenic shock. Echo LVEF 25-30%, LV severely decreased function and global hypokinesis, RV systolic function moderately reduced. PMT consulted to discuss GOC.   Today's Discussion 06/21/2024  *Please note that this is a verbal dictation therefore any spelling or grammatical errors are due to the Dragon Medical One system interpretation.  Chart reviewed inclusive of vital signs, progress notes, laboratory results, and diagnostic images.   NO significant nursing concerns identified.   I met with Phyllis Knight this morning. She is in fair spirits. She denies pain, nausea, or shortness of breath this morning.   Dicussed patients chronic disease burden in the setting of her heart failure - reviewed the progressive nature of this disease. She is pleasant though at time it appears unclear of the conversational context.  I have called patients spouse to provide additional updates, a HIPAA compliant VM was left.   Questions and concerns addressed/Palliative Support Provided.   Objective Assessment: Vital Signs Vitals:   06/21/24 0838 06/21/24 1230  BP: 109/87 102/74  Pulse: 94 81  Resp: 19 20  Temp: 97.6 F (36.4 C) 98 F (36.7 C)  SpO2: 99%     Intake/Output Summary (Last 24 hours) at 06/21/2024 1426 Last data filed at 06/20/2024 2136 Gross per 24 hour  Intake --  Output 1700 ml  Net -1700 ml   Last Weight  Most recent update: 06/21/2024  5:39 AM    Weight  80.5 kg (177 lb 7.5 oz)            Gen:  Elderly AA F chronically ill appearing HEENT: L bruise around eye, moist mucous membranes CV: Regular rate and rhythm  PULM: On 2LPM , breathing is even and nonlabored ABD: soft/nontender  EXT: (+) BLE edema Neuro: Aware  of self this morning  SUMMARY OF RECOMMENDATIONS   DNR but open to intubation  Plan for SNF placement  Ongoing conversations with family in days ahead  PMT will follow intermittently ______________________________________________________________________________________ Phyllis Knight [place] Palliative Medicine Team Team Cell Phone: [phone] Please utilize secure chat with additional questions, if there is no response within 30 minutes please call the above phone number  Time Spent: 25  Palliative Medicine Team providers are available by phone from 7am to 7pm daily and can be reached through the team cell phone.  Should this patient require assistance outside of these hours, please call the patient's attending physician.

## 2024-06-21 NOTE — Progress Notes (Addendum)
 Progress Note  Patient Name: Phyllis Knight Date of Encounter: 06/21/2024  Primary Cardiologist: Newman [name], MD  Subjective   Requesting heat to be turned up, reports general malaise. No CP, denies SOB but still rhonchorous on exam. RVP + parainfluenza virus.  Inpatient Medications    Scheduled Meds:  apixaban   5 mg Oral BID   Chlorhexidine  Gluconate Cloth  6 each Topical Daily   feeding supplement  237 mL Oral BID BM   furosemide   60 mg Intravenous Once   guaiFENesin   600 mg Oral BID   isosorbide  mononitrate  15 mg Oral Daily   metoprolol  succinate  25 mg Oral Daily   multivitamin with minerals  1 tablet Oral Daily   mouth rinse  15 mL Mouth Rinse 4 times per day   potassium chloride   40 mEq Oral Once   QUEtiapine   12.5 mg Oral QHS   rosuvastatin   20 mg Oral Daily   spironolactone   25 mg Oral Daily   Continuous Infusions:  PRN Meds: acetaminophen , docusate sodium , haloperidol  lactate, ipratropium-albuterol , magic mouthwash w/lidocaine , pantoprazole , phenol, polyethylene glycol   Vital Signs    Vitals:   06/21/24 0500 06/21/24 0505 06/21/24 0634 06/21/24 0838  BP:  108/79 108/74 109/87  Pulse:  (!) 103 98 94  Resp:  20 18 19   Temp:  97.8 F (36.6 C) 97.8 F (36.6 C) 97.6 F (36.4 C)  TempSrc:  Axillary Axillary Axillary  SpO2:  98% 99% 99%  Weight: 80.5 kg       Intake/Output Summary (Last 24 hours) at 06/21/2024 0844 Last data filed at 06/20/2024 2136 Gross per 24 hour  Intake --  Output 4050 ml  Net -4050 ml      06/21/2024    5:00 AM 06/20/2024    4:30 AM 06/19/2024    4:38 AM  Last 3 Weights  Weight (lbs) 177 lb 7.5 oz 177 lb 7.5 oz 188 lb 4.4 oz  Weight (kg) 80.5 kg 80.5 kg 85.4 kg     Telemetry    AF 90s-120s - frequently erring on the higher side - Personally Reviewed  Physical Exam   GEN: No acute distress.  HEENT: Normocephalic, atraumatic, sclera non-icteric. Neck: No JVD or bruits. Cardiac: Tachycardic, irregular, no  murmurs, rubs, or gallops.  Respiratory: Coarse rhonchorous BS throughout. Breathing is unlabored. GI: Soft, nontender, non-distended, BS +x 4. MS: no deformity. Extremities: No clubbing or cyanosis. Mild BLE edema. Distal pedal pulses are 2+ and equal bilaterally. Neuro:  AAOx3. Follows commands. Psych:  Responds to questions appropriately with a normal affect.  Labs    High Sensitivity Troponin:   Recent Labs  Lab 06/16/24 1255 06/16/24 1506 06/16/24 1700 06/16/24 2021  TROPONINIHS 160* 243* 306* 239*      Cardiac EnzymesNo results for input(s): TROPONINI in the last 168 hours. No results for input(s): TROPIPOC in the last 168 hours.   Chemistry Recent Labs  Lab 06/19/24 0549 06/20/24 0709 06/21/24 0615  NA 139 140 140  K 3.5 3.0* 4.6  CL 101 103 99  CO2 24 25 31   GLUCOSE 81 81 96  BUN 20 13 10   CREATININE 1.09* 0.82 0.78  CALCIUM  8.9 8.4* 9.0  PROT 7.0 6.3* 6.8  ALBUMIN  3.4* 2.8* 2.9*  AST 145* 98* 75*  ALT 107* 81* 74*  ALKPHOS 59 53 67  BILITOT 1.6* 1.9* 1.3*  GFRNONAA 50* >60 >60  ANIONGAP 14 12 10      Hematology Recent Labs  Lab 06/19/24  9450 06/20/24 0709 06/21/24 0615  WBC 5.4 4.3 5.0  RBC 3.73* 3.43* 3.69*  HGB 11.7* 10.7* 11.6*  HCT 36.3 33.9* 36.5  MCV 97.3 98.8 98.9  MCH 31.4 31.2 31.4  MCHC 32.2 31.6 31.8  RDW 14.9 14.6 14.5  PLT 108* 114* 174    BNP Recent Labs  Lab 06/16/24 1255 06/17/24 0420 06/19/24 0559  BNP 3,064.7* 3,334.3* 1,843.1*     DDimer No results for input(s): DDIMER in the last 168 hours.   Radiology    No results found.  Cardiac Studies   2d echo 06/17/24   1. Left ventricular ejection fraction, by estimation, is 25 to 30%. The  left ventricle has severely decreased function. The left ventricle  demonstrates global hypokinesis. Left ventricular diastolic function could  not be evaluated.   2. Right ventricular systolic function is moderately reduced. The right  ventricular size is moderately  enlarged. There is mildly elevated  pulmonary artery systolic pressure. The estimated right ventricular  systolic pressure is 44.6 mmHg.   3. Left atrial size was mildly dilated.   4. Right atrial size was mildly dilated.   5. The mitral valve is normal in structure. Mild mitral valve  regurgitation. No evidence of mitral stenosis.   6. Tricuspid valve regurgitation is moderate.   7. The aortic valve is normal in structure. Aortic valve regurgitation is  mild. No aortic stenosis is present.   8. The inferior vena cava is dilated in size with <50% respiratory  variability, suggesting right atrial pressure of 15 mmHg.   Patient Profile     83 y.o. female with hypertension, paroxysmal atrial fibrillation on Eliquis  (in NSR 01/2023), PVCs/PACs previously mentioned in notes, dementia, hyperlipidemia, fibromyalgia, GERD. Recently seen in ED 7/5 with fall depressed orbital fracture with some fat entrapment and retrobulbar hemorrhage, recommended for conservative management/outpatient follow-up. She presented back to the ED 7/16 with SOB and painful urination for 3 days. Found to have significant lactic acidosis, UTI, possible PNA, levated troponin (peak 306), elevated BNP, AF RVR. Admitted to critical care for acute hypoxic respiratory failure, mixed septic/cardiogenic shock, started on IV amiodarone  + abx. 2D echo showing new LV dysfunction EF 25-30% with global HK, moderately reduced RV function, mildly elevated PASP, mild BAE, mild MR/AI.  RVP + Parainfluenza.  Assessment & Plan    1.  Acute HFrEF with mixed septic/cardiogenic shock in setting of UTI, parainfluenza pneumonia/bronchitis - etiology of drop in EF unclear, possibly sepsis-mediated, regardless felt poor candidate for invasive measures given dementia, recommended for medical management. Elevated troponin felt due to demand ischemia - episodic IV Lasix  dosing given this admission, planned for 60mg  IV x 1 per IM -> agree -> will discuss  ongoing rx with MD - GDMT otherwise limited by low-normal BP: Toprol  25mg  daily, Imdur  15mg  daily, spironolactone  25mg  daily - no SGLT2i with UTI - pulm optimization per primary  2. Paroxysmal atrial fibrillation in RVR this admission - on Eliquis  PTA and currently, 5mg  BID dose appropriate - on IV amio earlier this admission now off; note elevated LFTs preceded use -> HR still frequently jumping to 120s. Worried she won't do well long term with continued tachycardia. Per d/w Dr. Lonni, add back IV amio with usual bolus/drip and follow since blood pressure otherwise precludes aggressive AVN titration - continue low dose Toprol  as we are able, would not escalate dose with acute heart failure (currently warm) - add on TSH to labs  3. AKI - peak Cr 1.28, resolved  4. Hypokalemia, hypomagnesemia - improved  5. Transaminitis - gradually trending down, recheck in AM - hold statin for now so as not to complicate the picture  6. GOC - seen by palliative team 7/18, DNR but open to intubation, hopeful for improvement/ability to return home but open to rehab if recommended, hospice introduced but family felt not at that point (felt to require SNF). This will likely be an ongoing evolving discussion process  For questions or updates, please contact [facility] Please consult [email or web address] for contact info under Cardiology/STEMI.  Signed, Lola Czerwonka N Kemi Gell, PA-C 06/21/2024, 8:44 AM

## 2024-06-21 NOTE — Progress Notes (Addendum)
   06/21/24 1015  Mobility  Activity  (Bed Level Exercises)  Level of Assistance Minimal assist, patient does 75% or more  Assistive Device None  Range of Motion/Exercises Active;Active Assistive;All extremities  Activity Response Tolerated well  Mobility Referral Yes  Mobility visit 1 Mobility  Mobility Specialist Start Time (ACUTE ONLY) 1015  Mobility Specialist Stop Time (ACUTE ONLY) 1030  Mobility Specialist Time Calculation (min) (ACUTE ONLY) 15 min   Mobility Specialist: Progress Note  Pre-Mobility:      HR 116 During Mobility: HR  130 Post-Mobility:    HR 90, SpO2 97% 2L  Pt agreeable to mobility session - received in chair position in bed. Pt was asymptomatic throughout session with no complaints. Further mobility deferred by RN d/t high HR. Left in chair position in bed with all needs met - call bell within reach.   Virgle Boards, BS Mobility Specialist Please contact via SecureChat or  Rehab office at [phone].

## 2024-06-21 NOTE — TOC Progression Note (Signed)
 Transition of Care [facility]) - Progression Note    Patient Details  Name: Phyllis Knight MRN: [id number] Date of Birth: December 07, 1940  [facility] CM/SW Contact  Inocente GORMAN Kindle, LCSW Phone Number: 06/21/2024, 1:02 PM  Clinical Narrative:    CSW spoke with patient's daughter and provided SNF bed offers and Medicare ratings. She will review and let CSW know but is likely leaning towards [facility] or [facility].    Expected Discharge Plan: Skilled Nursing Facility Barriers to Discharge: Continued Medical Work up, SNF Pending bed offer, English as a second language teacher  Expected Discharge Plan and Services In-house Referral: Clinical Social Work     Living arrangements for the past 2 months: Single Family Home                                       Social Determinants of Health (SDOH) Interventions SDOH Screenings   Food Insecurity: Patient Unable To Answer (06/17/2024)  Housing: Unknown (06/17/2024)  Transportation Needs: Patient Unable To Answer (06/17/2024)  Utilities: Patient Unable To Answer (06/17/2024)  Social Connections: Patient Unable To Answer (06/17/2024)  Tobacco Use: Low Risk  (06/16/2024)    Readmission Risk Interventions     No data to display

## 2024-06-21 NOTE — Progress Notes (Addendum)
 PROGRESS NOTE                                                                                                                                                                                                             Patient Demographics:    Phyllis Knight, is a 83 y.o. female, DOB - 07-30-1941, FMW:[id number]  Outpatient Primary MD for the patient is Claudene Pellet, MD    LOS - 5  Admit date - 06/16/2024    Chief Complaint  Patient presents with   Shortness of Breath       Brief Narrative (HPI from H&P)   83yo F with history of  Dementia, fibromyalgia, hx uterine cancer, afib on Eliquis , GERD, HLD . Presented with SOB, increased confusion, and dysuria x3 days. Was seen 7/5 for fall on Eliquis , found to have closed orbital fracture.   Reported decreased functional ability over the last year. Increased confusion and incontinence over the last week which is new for her per family.  In the ER she was diagnosed with acute hypoxic respiratory failure placed on BiPAP was diagnosed with CHF along with possible pneumonia admitted to ICU.  She was stabilized and transferred to my care on 06/19/2024 on day 3 of her hospital stay.   Significant Hospital Events: Including procedures, antibiotic start and stop dates in addition to other pertinent events    7/16 admitted to ICU, empiric Vancomycin  and Zosyn  started   Echocardiogram 7/17 showed EF 25-30%, moderately reduced RV systolic function, mildly elevated PA systolic pressure, mild MR, moderate TR.     Subjective:   Patient in bed, in no distress but mildly confused and sleepy, denies any headache, no chest or abdominal pain, no shortness of breath.   Assessment  & Plan :    Acute hypoxic respiratory failure, mixed septic and cardiogenic shock.  Due to combination of acute on chronic systolic heart failure EF around 25% and pneumonia.  Previous EF 60% few years ago.  Along  with parainfluenza virus pneumonia.  Seen by PCCM and now cardiology, still has evidence of fluid overload and pulmonary edema, has been aggressively diuresed with IV Lasix  with good effect.  CHF was the larger component of her respiratory distress.  She also had viral pneumonia due to parainfluenza virus with possible bacterial superinfection, has finished 5 days of Zosyn , continue  supplemental oxygen, on beta-blocker, will try to add low-dose ACE inhibitor if blood pressure and creatinine permit, added pulmonary toiletry, flutter valve and I-S.  Continue to monitor. Supportive care for parainfluenza virus pneumonia/bronchitis.   Paroxysmal atrial fibrillation [place] vas 2 score of greater than 3.  Recently on amiodarone  drip, currently on beta-blocker and Eliquis , as needed IV Cardizem  added, blood pressure likely the limiting factor.  Dyslipidemia.  On statin continue.  Underlying dementia.  At risk for delirium/metabolic encephalopathy, continue Seroquel  bedtime and monitor, minimize narcotics and benzodiazepines  Hypokalemia.  Replaced.    GERD.  PPI.      Condition - Extremely Guarded  Family Communication  :   Husband [name] on his cell phone [phone]  on 06/19/2024 at 9:14 AM, mailbox is full  Called daughter Corean [phone] on her cell phone on 06/19/2024 at 9:15 AM, message left  Called son [name] [phone]  on 06/19/2024, 06/20/2024, message left on his cell phone on 06/21/2024 at 8:38 AM.  Code Status : DNR  Consults  : PCCM, cardiology  PUD Prophylaxis :   Procedures  :          Disposition Plan  :    Status is: Inpatient   DVT Prophylaxis  :    SCDs Start: 06/16/24 1716 apixaban  (ELIQUIS ) tablet 5 mg    Lab Results  Component Value Date   PLT 174 06/21/2024    Diet :  Diet Order             Diet regular Room service appropriate? Yes; Fluid consistency: Thin  Diet effective now                    Inpatient Medications  Scheduled  Meds:  apixaban   5 mg Oral BID   Chlorhexidine  Gluconate Cloth  6 each Topical Daily   feeding supplement  237 mL Oral BID BM   furosemide   60 mg Intravenous Once   guaiFENesin   600 mg Oral BID   isosorbide  mononitrate  15 mg Oral Daily   metoprolol  succinate  25 mg Oral Daily   multivitamin with minerals  1 tablet Oral Daily   mouth rinse  15 mL Mouth Rinse 4 times per day   potassium chloride   40 mEq Oral Once   QUEtiapine   12.5 mg Oral QHS   rosuvastatin   20 mg Oral Daily   spironolactone   25 mg Oral Daily   Continuous Infusions:   PRN Meds:.acetaminophen , docusate sodium , haloperidol  lactate, ipratropium-albuterol , magic mouthwash w/lidocaine , pantoprazole , phenol, polyethylene glycol    Objective:   Vitals:   06/21/24 0313 06/21/24 0500 06/21/24 0505 06/21/24 0634  BP: 114/81  108/79 108/74  Pulse: (!) 115  (!) 103 98  Resp: (!) 21  20 18   Temp: 97.7 F (36.5 C)  97.8 F (36.6 C) 97.8 F (36.6 C)  TempSrc: Axillary  Axillary Axillary  SpO2: 100%  98% 99%  Weight:  80.5 kg      Wt Readings from Last 3 Encounters:  06/21/24 80.5 kg  06/05/24 72.6 kg  02/12/23 92.5 kg     Intake/Output Summary (Last 24 hours) at 06/21/2024 0826 Last data filed at 06/20/2024 2136 Gross per 24 hour  Intake --  Output 4050 ml  Net -4050 ml     Physical Exam  Awake mildly confused, no new F.N deficits, Normal affect [place].AT,PERRAL Supple Neck, No JVD,   Symmetrical Chest wall movement, Good air movement bilaterally, coarse bilateral breath sounds with coarse crackles  all over RRR,No Gallops,Rubs or new Murmurs,  +ve B.Sounds, Abd Soft, No tenderness,   No Cyanosis, Clubbing or edema       Data Review:    Recent Labs  Lab 06/16/24 1255 06/16/24 1444 06/17/24 0420 06/18/24 0412 06/19/24 0549 06/20/24 0709 06/21/24 0615  WBC 3.5*  --  5.0 9.1 5.4 4.3 5.0  HGB 12.2   < > 11.0* 11.3* 11.7* 10.7* 11.6*  HCT 38.0   < > 34.1* 34.8* 36.3 33.9* 36.5  PLT 119*   < > 100*  104* 108* 114* 174  MCV 100.0  --  98.3 98.3 97.3 98.8 98.9  MCH 32.1  --  31.7 31.9 31.4 31.2 31.4  MCHC 32.1  --  32.3 32.5 32.2 31.6 31.8  RDW 15.0  --  14.8 14.9 14.9 14.6 14.5  LYMPHSABS 0.6*  --   --   --   --   --   --   MONOABS 0.3  --   --   --   --   --   --   EOSABS 0.0  --   --   --   --   --   --   BASOSABS 0.0  --   --   --   --   --   --    < > = values in this interval not displayed.    Recent Labs  Lab 06/16/24 1255 06/16/24 1305 06/16/24 1444 06/16/24 1445 06/16/24 1506 06/16/24 1514 06/16/24 1517 06/16/24 2021 06/17/24 0420 06/18/24 0412 06/19/24 0549 06/19/24 0559 06/20/24 0709 06/21/24 0615  NA  --   --    < >  --    < >  --    < >  --  136 138 139  --  140 140  K  --   --    < >  --    < >  --    < >  --  3.5 3.5 3.5  --  3.0* 4.6  CL  --   --   --   --    < >  --   --   --  102 102 101  --  103 99  CO2  --   --   --   --    < >  --   --   --  21* 21* 24  --  25 31  ANIONGAP  --   --   --   --    < >  --   --   --  13 15 14   --  12 10  GLUCOSE  --   --   --   --    < >  --   --   --  173* 107* 81  --  81 96  BUN  --   --   --   --    < >  --   --   --  15 20 20   --  13 10  CREATININE  --   --   --   --    < >  --   --   --  0.95 1.28* 1.09*  --  0.82 0.78  AST  --   --   --   --    < >  --   --   --  176* 126* 145*  --  98* 75*  ALT  --   --   --   --    < >  --   --   --  87* 86* 107*  --  81* 74*  ALKPHOS  --   --   --   --    < >  --   --   --  50 48 59  --  53 67  BILITOT  --   --   --   --    < >  --   --   --  1.5* 1.6* 1.6*  --  1.9* 1.3*  ALBUMIN   --   --   --   --    < >  --   --   --  3.0* 3.3* 3.4*  --  2.8* 2.9*  CRP  --   --   --   --   --   --   --   --   --   --  8.1*  --   --   --   PROCALCITON  --   --   --   --   --   --   --  2.89 3.38 2.96 1.49  --   --   --   LATICACIDVEN  --   --   --  7.8*  --  6.4*  --  3.9* 2.0*  --   --   --   --   --   INR  --  1.8*  --   --   --   --   --  1.8*  --   --   --   --   --   --   BNP 3,064.7*   --   --   --   --   --   --   --  3,334.3*  --   --  1,843.1*  --   --   MG  --   --   --   --   --   --   --   --  1.6* 2.4 2.3  --  1.8 2.0  PHOS  --   --   --   --   --   --   --   --  3.5 3.4 2.9  --  3.8 3.1  CALCIUM   --   --   --   --    < >  --   --   --  8.7* 8.5* 8.9  --  8.4* 9.0   < > = values in this interval not displayed.      Recent Labs  Lab 06/16/24 1255 06/16/24 1305 06/16/24 1445 06/16/24 1506 06/16/24 1514 06/16/24 2021 06/17/24 0420 06/18/24 0412 06/19/24 0549 06/19/24 0559 06/20/24 0709 06/21/24 0615  CRP  --   --   --   --   --   --   --   --  8.1*  --   --   --   PROCALCITON  --   --   --   --   --  2.89 3.38 2.96 1.49  --   --   --   LATICACIDVEN  --   --  7.8*  --  6.4* 3.9* 2.0*  --   --   --   --   --   INR  --  1.8*  --   --   --  1.8*  --   --   --   --   --   --   BNP 3,064.7*  --   --   --   --   --  3,334.3*  --   --  1,843.1*  --   --   MG  --   --   --   --   --   --  1.6* 2.4 2.3  --  1.8 2.0  CALCIUM   --   --   --    < >  --   --  8.7* 8.5* 8.9  --  8.4* 9.0   < > = values in this interval not displayed.    --------------------------------------------------------------------------------------------------------------- Lab Results  Component Value Date   CHOL 207 (H) 12/15/2019   HDL 47 12/15/2019   LDLCALC 139 (H) 12/15/2019   LDLDIRECT 47 06/16/2024   TRIG 119 12/15/2019   CHOLHDL 4.4 12/15/2019   Radiology Report No results found.    Signature  -   Lavada Stank M.D on 06/21/2024 at 8:26 AM   -  To page go to [email or web address]

## 2024-06-21 NOTE — Plan of Care (Signed)
  Problem: Health Behavior/Discharge Planning: Goal: Ability to manage health-related needs will improve Outcome: Progressing   Problem: Clinical Measurements: Goal: Will remain free from infection Outcome: Progressing   Problem: Coping: Goal: Level of anxiety will decrease Outcome: Progressing   Problem: Safety: Goal: Ability to remain free from injury will improve Outcome: Progressing   

## 2024-06-22 ENCOUNTER — Inpatient Hospital Stay (HOSPITAL_COMMUNITY)

## 2024-06-22 DIAGNOSIS — I4891 Unspecified atrial fibrillation: Secondary | ICD-10-CM | POA: Diagnosis not present

## 2024-06-22 DIAGNOSIS — Z7189 Other specified counseling: Secondary | ICD-10-CM | POA: Diagnosis not present

## 2024-06-22 DIAGNOSIS — I5041 Acute combined systolic (congestive) and diastolic (congestive) heart failure: Secondary | ICD-10-CM | POA: Diagnosis not present

## 2024-06-22 DIAGNOSIS — R579 Shock, unspecified: Secondary | ICD-10-CM | POA: Diagnosis not present

## 2024-06-22 DIAGNOSIS — Z515 Encounter for palliative care: Secondary | ICD-10-CM | POA: Diagnosis not present

## 2024-06-22 LAB — CBC WITH DIFFERENTIAL/PLATELET
Abs Immature Granulocytes: 0.01 K/uL (ref 0.00–0.07)
Basophils Absolute: 0 K/uL (ref 0.0–0.1)
Basophils Relative: 0 %
Eosinophils Absolute: 0.2 K/uL (ref 0.0–0.5)
Eosinophils Relative: 4 %
HCT: 35.3 % — ABNORMAL LOW (ref 36.0–46.0)
Hemoglobin: 11.8 g/dL — ABNORMAL LOW (ref 12.0–15.0)
Immature Granulocytes: 0 %
Lymphocytes Relative: 28 %
Lymphs Abs: 1.4 K/uL (ref 0.7–4.0)
MCH: 33.8 pg (ref 26.0–34.0)
MCHC: 33.4 g/dL (ref 30.0–36.0)
MCV: 101.1 fL — ABNORMAL HIGH (ref 80.0–100.0)
Monocytes Absolute: 0.4 K/uL (ref 0.1–1.0)
Monocytes Relative: 8 %
Neutro Abs: 2.8 K/uL (ref 1.7–7.7)
Neutrophils Relative %: 60 %
Platelets: 192 K/uL (ref 150–400)
RBC: 3.49 MIL/uL — ABNORMAL LOW (ref 3.87–5.11)
RDW: 14.6 % (ref 11.5–15.5)
WBC: 4.8 K/uL (ref 4.0–10.5)
nRBC: 0 % (ref 0.0–0.2)

## 2024-06-22 LAB — COMPREHENSIVE METABOLIC PANEL WITH GFR
ALT: 59 U/L — ABNORMAL HIGH (ref 0–44)
AST: 51 U/L — ABNORMAL HIGH (ref 15–41)
Albumin: 2.8 g/dL — ABNORMAL LOW (ref 3.5–5.0)
Alkaline Phosphatase: 70 U/L (ref 38–126)
Anion gap: 10 (ref 5–15)
BUN: 10 mg/dL (ref 8–23)
CO2: 29 mmol/L (ref 22–32)
Calcium: 9.1 mg/dL (ref 8.9–10.3)
Chloride: 99 mmol/L (ref 98–111)
Creatinine, Ser: 0.82 mg/dL (ref 0.44–1.00)
GFR, Estimated: >60 mL/min (ref >60)
Glucose, Bld: 96 mg/dL (ref 70–99)
Potassium: 4.5 mmol/L (ref 3.5–5.1)
Sodium: 138 mmol/L (ref 135–145)
Total Bilirubin: 1.2 mg/dL (ref 0.0–1.2)
Total Protein: 6.8 g/dL (ref 6.5–8.1)

## 2024-06-22 LAB — MAGNESIUM: Magnesium: 1.7 mg/dL (ref 1.7–2.4)

## 2024-06-22 LAB — GLUCOSE, CAPILLARY: Glucose-Capillary: 123 mg/dL — ABNORMAL HIGH (ref 70–99)

## 2024-06-22 LAB — BRAIN NATRIURETIC PEPTIDE: B Natriuretic Peptide: 680.8 pg/mL — ABNORMAL HIGH (ref 0.0–100.0)

## 2024-06-22 MED ORDER — FUROSEMIDE 10 MG/ML IJ SOLN
60.0000 mg | Freq: Once | INTRAMUSCULAR | Status: AC
  Administered 2024-06-22: 60 mg via INTRAVENOUS
  Filled 2024-06-22: qty 6

## 2024-06-22 MED ORDER — MAGNESIUM SULFATE 2 GM/50ML IV SOLN
2.0000 g | Freq: Once | INTRAVENOUS | Status: AC
  Administered 2024-06-22: 2 g via INTRAVENOUS
  Filled 2024-06-22: qty 50

## 2024-06-22 MED ORDER — METOPROLOL SUCCINATE ER 50 MG PO TB24
50.0000 mg | ORAL_TABLET | Freq: Every day | ORAL | Status: DC
  Administered 2024-06-23: 50 mg via ORAL
  Filled 2024-06-22: qty 1

## 2024-06-22 MED ORDER — SPIRONOLACTONE 12.5 MG HALF TABLET
12.5000 mg | ORAL_TABLET | Freq: Every day | ORAL | Status: DC
  Administered 2024-06-23 – 2024-06-25 (×3): 12.5 mg via ORAL
  Filled 2024-06-22 (×3): qty 1

## 2024-06-22 NOTE — Progress Notes (Signed)
 Palliative Medicine Inpatient Follow Up Note HPI: 83 y.o. female  with past medical history of dementia, fibromyalgia, hx uterine cancer, afib on Eliquis , GERD, and HLD admitted on 06/16/2024 with new onset shortness of breath.  Diagnosed with mixed septic and cardiogenic shock. Echo LVEF 25-30%, LV severely decreased function and global hypokinesis, RV systolic function moderately reduced. PMT consulted to discuss GOC.   Today's Discussion 06/22/2024  *Please note that this is a verbal dictation therefore any spelling or grammatical errors are due to the Dragon Medical One system interpretation.  Chart reviewed inclusive of vital signs, progress notes, laboratory results, and diagnostic images.   I met with Phyllis Knight at bedside this afternoon in the company of her spouse, Phyllis Knight, daughter, Phyllis Knight, and son.   We reviewed Phyllis Knight's current health and the severity of her heart failure, dementia, and AFib. We discussed dementia as a chronic progressive disease. 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 We discussed overtime with AD patients can become less like themselves, they can get aggressive, they slowly lose the ability to provide self care, they become more at risk for infection(s) like PNA's or UTI's, and often they overtime develop failure to thrive.   Patients spouse shares caring for Phyllis Knight has become more difficult and exhaustive and she makes him wait on her hand and foot. Phyllis Knight apparently awakens in the day and does very little beyond sitting in a chair throughout the day.   We reviewed the chronic disease trajectory inclusive of recurring rehospitalization(s) and declining function as a result of this. We reviewed that overtime the hospitalization become more cyclic and patient decline more pronounced. I shared the idea of hospice is patients family begin to notice a decline.  We discussed OP  Palliative support in the immediate future to provide additional education and re-evaluation of goals once [facility] discharges. Patients family are in agreement with this.   Questions and concerns addressed/Palliative Support Provided.   Objective Assessment: Vital Signs Vitals:   06/22/24 0817 06/22/24 1200  BP: (!) 133/99 114/79  Pulse: 92 93  Resp: (!) 24 (!) 22  Temp: (!) 97.2 F (36.2 C) (!) 97.3 F (36.3 C)  SpO2: 97% (!) 89%    Intake/Output Summary (Last 24 hours) at 06/22/2024 1436 Last data filed at 06/22/2024 1305 Gross per 24 hour  Intake --  Output 1800 ml  Net -1800 ml   Last Weight  Most recent update: 06/22/2024  5:56 AM    Weight  77.4 kg (170 lb 10.2 oz)            Gen:  Elderly AA F chronically ill appearing HEENT: L bruise around eye, moist mucous membranes CV: Regular rate and rhythm  PULM: On 2LPM [place], breathing is even and nonlabored ABD: soft/nontender  EXT: (+) BLE edema Neuro: Aware of self this morning  SUMMARY OF RECOMMENDATIONS   DNAR  Plan for SNF placement in the next 1-2 days if remains stable  Plan for OP Palliative support on discharge  PMT will follow intermittently ______________________________________________________________________________________ Phyllis Knight Palliative Medicine Team Team Cell Phone: [phone] Please utilize secure chat with additional questions, if there is no response within 30 minutes please call the above phone number  Time: 45 MDM: High  Palliative Medicine Team providers are available by phone from 7am to 7pm daily and can be reached through the team cell phone.  Should this patient require assistance outside of  these hours, please call the patient's attending physician.

## 2024-06-22 NOTE — Plan of Care (Signed)

## 2024-06-22 NOTE — Progress Notes (Addendum)
 Progress Note  Patient Name: Phyllis Knight Date of Encounter: 06/22/2024 [facility] Cardiologist: Newman Phyllis Lawrence, MD   Interval Summary   Denies any shortness of breath, chest pain, palpitations, melena, hematochezia, and hematuria.  Vital Signs Vitals:   06/22/24 0435 06/22/24 0554 06/22/24 0600 06/22/24 0817  BP: 113/83     Pulse: 83 98 98   Resp: 20 (!) 21 20   Temp: 97.6 F (36.4 C)   (!) 97.2 F (36.2 C)  TempSrc: Axillary   Oral  SpO2: 99% 99% 99%   Weight:  77.4 kg      Intake/Output Summary (Last 24 hours) at 06/22/2024 1047 Last data filed at 06/22/2024 0602 Gross per 24 hour  Intake --  Output 1200 ml  Net -1200 ml      06/22/2024    5:54 AM 06/21/2024    5:00 AM 06/20/2024    4:30 AM  Last 3 Weights  Weight (lbs) 170 lb 10.2 oz 177 lb 7.5 oz 177 lb 7.5 oz  Weight (kg) 77.4 kg 80.5 kg 80.5 kg      Telemetry/ECG  Atrial fibrillation with heart rates in the 80-90- Personally Reviewed  Physical Exam  GEN: No acute distress.  On 2 L nasal cannula.  During conversation patient was slightly confused at times and was asking where her husband was at. This appears to be baseline with her underlying dementia. Neck: Positive JVD Cardiac: Irregularly irregular rhythm, no murmurs, rubs, or gallops.  Respiratory: Wheezing and crackles noted throughout the lungs. GI: Soft, nontender, non-distended  MS: No edema  Assessment & Plan  [name] is a 83 y.o. 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 Theophilus   Acute systolic and diastolic heart failure UTI Parainfluenza pneumonia/bronchitis LVEF this admission newly reduced at 25-30% from 60-65% in 2020.  The reduction in the LVEF was felt to be secondary to the patients acute illness.  Plan to recheck echo in about 3 months to reassess LVEF.  Outpatient  ischemic evaluation may be considered at that time.  Parainfluenza infection and UTI are managed by primary team.  Has received multiple doses of IV Lasix .  Chest x-ray today showed pulmonary vascular congestion.  BNP elevated at 680. I/O do not appear to be accurate as no intake was documented. Weight is down 15 pounds since admission. Appeared slightly volume over loaded on exam. Continue 60 mg IV Lasix  daily. GDMT Continue spironolactone  12.5 mg daily Avoid SGLT2 because of prior UTI's. Increase Metoprolol  succinate to 50mg  daily.   Atrial fibrillation with RVR HTN CHA2DS2-VASc Score = 5 [CHF History: 1, HTN History: 0, Diabetes History: 0, Stroke History: 0, Vascular Disease History: 1, Age Score: 2, Gender Score: 1].  Therefore, the patient's annual risk of stroke is 7.2 %.  Heart rate appears to be well-controlled in the 80's to 90's.  Over the past day blood pressure increase slightly most recent BP 113/83. K 4.5, Mg 1.7. will order magnesium  replacement for Goal >2. Continue Eliquis  5 mg twice daily. (Correct dose for weight and creatinine.) Increase Metoprolol  succinate 50mg  daily. Will discuss with MD continuing IV amiodarone  and tentatively planning to transition to oral tomorrow.  Transaminates Continuing to trend down. Most recent AST 51 and ALT 59. Will continue to hold statin.   Otherwise managed per primary Acute hypoxic respiratory failure, resolved Mixed septic/cardiogenic shock, resolved  Acute kidney injury, resolved Recent fall with closed orbital fracture Demetia Goals of care - being seen by palliative team.     For questions or updates, please contact [facility] Please consult [email or web address] for contact info under       Signed, Shaniqwa Horsman, PA-C

## 2024-06-22 NOTE — Progress Notes (Signed)
 Physical Therapy Treatment Patient Details Name: Phyllis Knight MRN: [id number] DOB: 09/04/1941 Today's Date: 06/22/2024   History of Present Illness Pt is an 83 yo female presenting to [facility] on 06/16/24 with SOB, possible septic shock secondary to UTI. PMH significant for HTN, Atrial Fibrillation on Eliquis , Dementia and HLD    PT Comments  Pt with fair tolerance to treatment today. Pt able to transfer to chair with +2 Mod A HHA. Pt required max encouragement for mobilization. No change in DC/DME recs at this time. PT will continue to follow.     If plan is discharge home, recommend the following: A little help with walking and/or transfers;A lot of help with bathing/dressing/bathroom;Assistance with cooking/housework;Direct supervision/assist for medications management;Help with stairs or ramp for entrance;Supervision due to cognitive status   Can travel by private vehicle     Yes  Equipment Recommendations  None recommended by PT    Recommendations for Other Services       Precautions / Restrictions Precautions Precautions: Fall Recall of Precautions/Restrictions: Impaired Restrictions Weight Bearing Restrictions Per Provider Order: No     Mobility  Bed Mobility Overal bed mobility: Needs Assistance Bed Mobility: Supine to Sit     Supine to sit: Mod assist     General bed mobility comments: Mod A to scoot hips forwards using bed pad.    Transfers Overall transfer level: Needs assistance Equipment used: 2 person hand held assist Transfers: Sit to/from Stand, Bed to chair/wheelchair/BSC Sit to Stand: Mod assist, +2 physical assistance Stand pivot transfers: +2 physical assistance, Mod assist         General transfer comment: +2 Mod A to stand pivot via HHA.    Ambulation/Gait               General Gait Details: deferred   Stairs             Wheelchair Mobility     Tilt Bed    Modified Rankin (Stroke Patients Only)       Balance  Overall balance assessment: Needs assistance Sitting-balance support: No upper extremity supported, Feet supported Sitting balance-Leahy Scale: Fair Sitting balance - Comments: EOB with CGA   Standing balance support: Bilateral upper extremity supported, During functional activity, Reliant on assistive device for balance Standing balance-Leahy Scale: Poor                              Communication Communication Communication: No apparent difficulties  Cognition Arousal: Alert Behavior During Therapy: WFL for tasks assessed/performed   PT - Cognitive impairments: History of cognitive impairments, Orientation, Awareness, Memory, Attention, Sequencing   Orientation impairments: Situation, Time                   PT - Cognition Comments: was able to name month (July) but not year.  Asking to go home when PT entered room. Following commands: Impaired Following commands impaired: Only follows one step commands consistently    Cueing Cueing Techniques: Verbal cues, Tactile cues  Exercises      General Comments General comments (skin integrity, edema, etc.): VSS      Pertinent Vitals/Pain Pain Assessment Pain Assessment: No/denies pain    Home Living                          Prior Function            PT Goals (current goals  can now be found in the care plan section) Progress towards PT goals: Progressing toward goals    Frequency    Min 2X/week      PT Plan      Co-evaluation              AM-PAC PT 6 Clicks Mobility   Outcome Measure  Help needed turning from your back to your side while in a flat bed without using bedrails?: A Little Help needed moving from lying on your back to sitting on the side of a flat bed without using bedrails?: A Little Help needed moving to and from a bed to a chair (including a wheelchair)?: A Little Help needed standing up from a chair using your arms (e.g., wheelchair or bedside chair)?: A  Little Help needed to walk in hospital room?: A Lot Help needed climbing 3-5 steps with a railing? : A Lot 6 Click Score: 16    End of Session Equipment Utilized During Treatment: Gait belt Activity Tolerance: Patient tolerated treatment well Patient left: in chair;with call bell/phone within reach;with chair alarm set;with family/visitor present Nurse Communication: Mobility status PT Visit Diagnosis: Unsteadiness on feet (R26.81);Other abnormalities of gait and mobility (R26.89);Repeated falls (R29.6);Muscle weakness (generalized) (M62.81);History of falling (Z91.81)     Time: 8549-8486 PT Time Calculation (min) (ACUTE ONLY): 23 min  Charges:    $Therapeutic Activity: 23-37 mins PT General Charges $$ ACUTE PT VISIT: 1 Visit                     Sueellen NOVAK, PT, DPT Acute Rehab Services [phone]    Melea Prezioso 06/22/2024, 3:57 PM

## 2024-06-22 NOTE — TOC Progression Note (Addendum)
 Transition of Care [facility]) - Progression Note    Patient Details  Name: Phyllis Knight MRN: [id number] Date of Birth: [birth year]  [facility] CM/SW Contact  Inocente GORMAN Kindle, LCSW Phone Number: 06/22/2024, 12:48 PM  Clinical Narrative:    12:48 PM-CSW received call from patient's daughter, Corean. CSW returned call but voicemail is full.   1:05 PM-Daughter returned call and asked CSW to email her the offers to [email or web address]. CSW sent offers and Medicare ratings.    Expected Discharge Plan: Skilled Nursing Facility Barriers to Discharge: Continued Medical Work up, SNF Pending bed offer, English as a second language teacher  Expected Discharge Plan and Services In-house Referral: Clinical Social Work     Living arrangements for the past 2 months: Single Family Home                                       Social Determinants of Health (SDOH) Interventions SDOH Screenings   Food Insecurity: Patient Unable To Answer (06/17/2024)  Housing: Unknown (06/17/2024)  Transportation Needs: Patient Unable To Answer (06/17/2024)  Utilities: Patient Unable To Answer (06/17/2024)  Social Connections: Patient Unable To Answer (06/17/2024)  Tobacco Use: Low Risk  (06/16/2024)    Readmission Risk Interventions     No data to display

## 2024-06-22 NOTE — Plan of Care (Signed)
  Problem: Health Behavior/Discharge Planning: Goal: Ability to manage health-related needs will improve Outcome: Progressing   Problem: Clinical Measurements: Goal: Will remain free from infection Outcome: Progressing Goal: Respiratory complications will improve Outcome: Progressing   Problem: Coping: Goal: Level of anxiety will decrease Outcome: Progressing   Problem: Safety: Goal: Ability to remain free from injury will improve Outcome: Progressing

## 2024-06-22 NOTE — Progress Notes (Addendum)
 PROGRESS NOTE                                                                                                                                                                                                             Patient Demographics:    Phyllis Knight, is a 83 y.o. female, DOB - 1941-10-10, FMW:[id number]  Outpatient Primary MD for the patient is Claudene Pellet, MD    LOS - 6  Admit date - 06/16/2024    Chief Complaint  Patient presents with   Shortness of Breath       Brief Narrative (HPI from H&P)   83yo F with history of  Dementia, fibromyalgia, hx uterine cancer, afib on Eliquis , GERD, HLD . Presented with SOB, increased confusion, and dysuria x3 days. Was seen 7/5 for fall on Eliquis , found to have closed orbital fracture.   Reported decreased functional ability over the last year. Increased confusion and incontinence over the last week which is new for her per family.  In the ER she was diagnosed with acute hypoxic respiratory failure placed on BiPAP was diagnosed with CHF along with possible pneumonia admitted to ICU.  She was stabilized and transferred to my care on 06/19/2024 on day 3 of her hospital stay.   Significant Hospital Events: Including procedures, antibiotic start and stop dates in addition to other pertinent events    7/16 admitted to ICU, empiric Vancomycin  and Zosyn  started   Echocardiogram 7/17 showed EF 25-30%, moderately reduced RV systolic function, mildly elevated PA systolic pressure, mild MR, moderate TR.     Subjective:   Patient in bed, minimally confused but in no distress, denies any headache chest or abdominal pain, shortness of breath is considerably improved.   Assessment  & Plan :    Acute hypoxic respiratory failure, mixed septic and cardiogenic shock.  Due to combination of acute on chronic systolic heart failure EF around 25% and pneumonia.  Previous EF 60% few years ago.   Along with parainfluenza virus pneumonia.  Seen by PCCM and now cardiology, still has evidence of fluid overload and pulmonary edema, has been aggressively diuresed with IV Lasix  with good effect.  CHF was the larger component of her respiratory distress.  Much improved now, dosing Lasix  daily.  Continue Aldactone .  She also had viral pneumonia due to parainfluenza virus with possible  bacterial superinfection, has finished 5 days of Zosyn , continue supplemental oxygen, on beta-blocker, will try to add low-dose ACE inhibitor if blood pressure and creatinine permit, added pulmonary toiletry, flutter valve and I-S.  Continue to monitor. Supportive care for parainfluenza virus pneumonia/bronchitis.   Paroxysmal atrial fibrillation [place] vas 2 score of greater than 3.  Recently on amiodarone  drip, currently on beta-blocker and Eliquis , as needed IV Cardizem  added, cardiology following and managing.  Dyslipidemia.  On statin continue.  Underlying dementia.  At risk for delirium/metabolic encephalopathy, continue Seroquel  bedtime and monitor, minimize narcotics and benzodiazepines  Hypokalemia.  Replaced.    GERD.  PPI.      Condition - Extremely Guarded  Family Communication  :   Husband [name] on his cell phone [phone]  on 06/19/2024 at 9:14 AM, mailbox is full  Called daughter Corean [phone] on her cell phone on 06/19/2024 at 9:15 AM, message left  Called son [name] [phone]  on 06/19/2024, 06/20/2024, message left on his cell phone on 06/21/2024 at 8:38 AM.  Code Status : DNR  Consults  : PCCM, cardiology  PUD Prophylaxis :   Procedures  :          Disposition Plan  :    Status is: Inpatient   DVT Prophylaxis  :    SCDs Start: 06/16/24 1716 apixaban  (ELIQUIS ) tablet 5 mg    Lab Results  Component Value Date   PLT 192 06/22/2024    Diet :  Diet Order             Diet regular Room service appropriate? Yes; Fluid consistency: Thin  Diet effective now                     Inpatient Medications  Scheduled Meds:  apixaban   5 mg Oral BID   Chlorhexidine  Gluconate Cloth  6 each Topical Daily   feeding supplement  237 mL Oral BID BM   furosemide   60 mg Intravenous Once   guaiFENesin   600 mg Oral BID   metoprolol  succinate  25 mg Oral Daily   multivitamin with minerals  1 tablet Oral Daily   mouth rinse  15 mL Mouth Rinse 4 times per day   QUEtiapine   12.5 mg Oral QHS   [START ON 06/23/2024] spironolactone   12.5 mg Oral Daily   Continuous Infusions:  amiodarone  30 mg/hr (06/22/24 0816)    PRN Meds:.acetaminophen , docusate sodium , haloperidol  lactate, ipratropium-albuterol , magic mouthwash w/lidocaine , pantoprazole , phenol, polyethylene glycol    Objective:   Vitals:   06/22/24 0435 06/22/24 0554 06/22/24 0600 06/22/24 0817  BP: 113/83     Pulse: 83 98 98   Resp: 20 (!) 21 20   Temp: 97.6 F (36.4 C)   (!) 97.2 F (36.2 C)  TempSrc: Axillary   Oral  SpO2: 99% 99% 99%   Weight:  77.4 kg      Wt Readings from Last 3 Encounters:  06/22/24 77.4 kg  06/05/24 72.6 kg  02/12/23 92.5 kg     Intake/Output Summary (Last 24 hours) at 06/22/2024 0903 Last data filed at 06/22/2024 0602 Gross per 24 hour  Intake --  Output 1200 ml  Net -1200 ml     Physical Exam  Awake mildly confused, no new F.N deficits, Normal affect [place].AT,PERRAL Supple Neck, No JVD,   Symmetrical Chest wall movement, Good air movement bilaterally, much improved breath sounds today with minimal crackles RRR,No Gallops,Rubs or new Murmurs,  +ve B.Sounds, Abd Soft, No tenderness,  No Cyanosis, Clubbing or edema       Data Review:    Recent Labs  Lab 06/16/24 1255 06/16/24 1444 06/18/24 0412 06/19/24 0549 06/20/24 0709 06/21/24 0615 06/22/24 0542  WBC 3.5*   < > 9.1 5.4 4.3 5.0 4.8  HGB 12.2   < > 11.3* 11.7* 10.7* 11.6* 11.8*  HCT 38.0   < > 34.8* 36.3 33.9* 36.5 35.3*  PLT 119*   < > 104* 108* 114* 174 192  MCV 100.0   < > 98.3 97.3 98.8  98.9 101.1*  MCH 32.1   < > 31.9 31.4 31.2 31.4 33.8  MCHC 32.1   < > 32.5 32.2 31.6 31.8 33.4  RDW 15.0   < > 14.9 14.9 14.6 14.5 14.6  LYMPHSABS 0.6*  --   --   --   --   --  1.4  MONOABS 0.3  --   --   --   --   --  0.4  EOSABS 0.0  --   --   --   --   --  0.2  BASOSABS 0.0  --   --   --   --   --  0.0   < > = values in this interval not displayed.    Recent Labs  Lab 06/16/24 1255 06/16/24 1305 06/16/24 1444 06/16/24 1445 06/16/24 1506 06/16/24 1514 06/16/24 1517 06/16/24 2021 06/17/24 0420 06/18/24 0412 06/19/24 0549 06/19/24 0559 06/20/24 0709 06/21/24 0615 06/21/24 0930 06/22/24 0542  NA  --   --    < >  --    < >  --    < >  --  136 138 139  --  140 140  --  138  K  --   --    < >  --    < >  --    < >  --  3.5 3.5 3.5  --  3.0* 4.6  --  4.5  CL  --   --   --   --    < >  --   --   --  102 102 101  --  103 99  --  99  CO2  --   --   --   --    < >  --   --   --  21* 21* 24  --  25 31  --  29  ANIONGAP  --   --   --   --    < >  --   --   --  13 15 14   --  12 10  --  10  GLUCOSE  --   --   --   --    < >  --   --   --  173* 107* 81  --  81 96  --  96  BUN  --   --   --   --    < >  --   --   --  15 20 20   --  13 10  --  10  CREATININE  --   --   --   --    < >  --   --   --  0.95 1.28* 1.09*  --  0.82 0.78  --  0.82  AST  --   --   --   --    < >  --   --   --  176* 126* 145*  --  98* 75*  --  51*  ALT  --   --   --   --    < >  --   --   --  87* 86* 107*  --  81* 74*  --  59*  ALKPHOS  --   --   --   --    < >  --   --   --  50 48 59  --  53 67  --  70  BILITOT  --   --   --   --    < >  --   --   --  1.5* 1.6* 1.6*  --  1.9* 1.3*  --  1.2  ALBUMIN   --   --   --   --    < >  --   --   --  3.0* 3.3* 3.4*  --  2.8* 2.9*  --  2.8*  CRP  --   --   --   --   --   --   --   --   --   --  8.1*  --   --   --   --   --   PROCALCITON  --   --   --   --   --   --   --  2.89 3.38 2.96 1.49  --   --   --   --   --   LATICACIDVEN  --   --   --  7.8*  --  6.4*  --  3.9* 2.0*  --    --   --   --   --   --   --   INR  --  1.8*  --   --   --   --   --  1.8*  --   --   --   --   --   --   --   --   TSH  --   --   --   --   --   --   --   --   --   --   --   --   --   --  4.112  --   BNP 3,064.7*  --   --   --   --   --   --   --  3,334.3*  --   --  1,843.1*  --   --   --  680.8*  MG  --   --   --   --    < >  --   --   --  1.6* 2.4 2.3  --  1.8 2.0  --  1.7  PHOS  --   --   --   --   --   --   --   --  3.5 3.4 2.9  --  3.8 3.1  --   --   CALCIUM   --   --   --   --    < >  --   --   --  8.7* 8.5* 8.9  --  8.4* 9.0  --  9.1   < > = values in this interval not displayed.      Recent Labs  Lab 06/16/24 1255 06/16/24 1305 06/16/24 1445 06/16/24 1506 06/16/24 1514 06/16/24 2021 06/17/24 9579 06/18/24 0412 06/19/24 0549 06/19/24 0559 06/20/24 0709 06/21/24 0615 06/21/24  0930 06/22/24 0542  CRP  --   --   --   --   --   --   --   --  8.1*  --   --   --   --   --   PROCALCITON  --   --   --   --   --  2.89 3.38 2.96 1.49  --   --   --   --   --   LATICACIDVEN  --   --  7.8*  --  6.4* 3.9* 2.0*  --   --   --   --   --   --   --   INR  --  1.8*  --   --   --  1.8*  --   --   --   --   --   --   --   --   TSH  --   --   --   --   --   --   --   --   --   --   --   --  4.112  --   BNP 3,064.7*  --   --   --   --   --  3,334.3*  --   --  1,843.1*  --   --   --  680.8*  MG  --   --   --    < >  --   --  1.6* 2.4 2.3  --  1.8 2.0  --  1.7  CALCIUM   --   --   --    < >  --   --  8.7* 8.5* 8.9  --  8.4* 9.0  --  9.1   < > = values in this interval not displayed.    --------------------------------------------------------------------------------------------------------------- Lab Results  Component Value Date   CHOL 207 (H) 12/15/2019   HDL 47 12/15/2019   LDLCALC 139 (H) 12/15/2019   LDLDIRECT 47 06/16/2024   TRIG 119 12/15/2019   CHOLHDL 4.4 12/15/2019   Radiology Report DG Chest Port 1 View Result Date: 06/22/2024 CLINICAL DATA:  Shortness of breath and  hypertension. EXAM: PORTABLE CHEST 1 VIEW COMPARISON:  06/19/2024 FINDINGS: The cardio pericardial silhouette is enlarged. There is pulmonary vascular congestion. Interstitial pulmonary edema not excluded. Streaky density in the lower lungs bilaterally suggest atelectasis. No substantial pleural effusion. Telemetry leads overlie the chest. IMPRESSION: Enlargement of the cardiopericardial silhouette with pulmonary vascular congestion. Interstitial pulmonary edema not excluded. Electronically Signed   By: Camellia Candle M.D.   On: 06/22/2024 07:01      Signature  -   Lavada Stank M.D on 06/22/2024 at 9:03 AM   -  To page go to [email or web address]

## 2024-06-23 DIAGNOSIS — I4891 Unspecified atrial fibrillation: Secondary | ICD-10-CM | POA: Diagnosis not present

## 2024-06-23 DIAGNOSIS — I5041 Acute combined systolic (congestive) and diastolic (congestive) heart failure: Secondary | ICD-10-CM | POA: Diagnosis not present

## 2024-06-23 DIAGNOSIS — Z515 Encounter for palliative care: Secondary | ICD-10-CM | POA: Diagnosis not present

## 2024-06-23 DIAGNOSIS — R579 Shock, unspecified: Secondary | ICD-10-CM | POA: Diagnosis not present

## 2024-06-23 LAB — CBC WITH DIFFERENTIAL/PLATELET
Abs Immature Granulocytes: 0.02 K/uL (ref 0.00–0.07)
Basophils Absolute: 0 K/uL (ref 0.0–0.1)
Basophils Relative: 0 %
Eosinophils Absolute: 0.2 K/uL (ref 0.0–0.5)
Eosinophils Relative: 3 %
HCT: 35.1 % — ABNORMAL LOW (ref 36.0–46.0)
Hemoglobin: 12.4 g/dL (ref 12.0–15.0)
Immature Granulocytes: 0 %
Lymphocytes Relative: 29 %
Lymphs Abs: 1.4 K/uL (ref 0.7–4.0)
MCH: 35.6 pg — ABNORMAL HIGH (ref 26.0–34.0)
MCHC: 35.3 g/dL (ref 30.0–36.0)
MCV: 100.9 fL — ABNORMAL HIGH (ref 80.0–100.0)
Monocytes Absolute: 0.5 K/uL (ref 0.1–1.0)
Monocytes Relative: 11 %
Neutro Abs: 2.7 K/uL (ref 1.7–7.7)
Neutrophils Relative %: 57 %
Platelets: 190 K/uL (ref 150–400)
RBC: 3.48 MIL/uL — ABNORMAL LOW (ref 3.87–5.11)
RDW: 14.8 % (ref 11.5–15.5)
WBC: 4.8 K/uL (ref 4.0–10.5)
nRBC: 0 % (ref 0.0–0.2)

## 2024-06-23 LAB — COMPREHENSIVE METABOLIC PANEL WITH GFR
ALT: 49 U/L — ABNORMAL HIGH (ref 0–44)
AST: 46 U/L — ABNORMAL HIGH (ref 15–41)
Albumin: 2.8 g/dL — ABNORMAL LOW (ref 3.5–5.0)
Alkaline Phosphatase: 69 U/L (ref 38–126)
Anion gap: 12 (ref 5–15)
BUN: 13 mg/dL (ref 8–23)
CO2: 28 mmol/L (ref 22–32)
Calcium: 9.4 mg/dL (ref 8.9–10.3)
Chloride: 96 mmol/L — ABNORMAL LOW (ref 98–111)
Creatinine, Ser: 0.71 mg/dL (ref 0.44–1.00)
GFR, Estimated: >60 mL/min (ref >60)
Glucose, Bld: 92 mg/dL (ref 70–99)
Potassium: 4.5 mmol/L (ref 3.5–5.1)
Sodium: 136 mmol/L (ref 135–145)
Total Bilirubin: 1.6 mg/dL — ABNORMAL HIGH (ref 0.0–1.2)
Total Protein: 6.6 g/dL (ref 6.5–8.1)

## 2024-06-23 LAB — MAGNESIUM: Magnesium: 2 mg/dL (ref 1.7–2.4)

## 2024-06-23 LAB — BRAIN NATRIURETIC PEPTIDE: B Natriuretic Peptide: 639.5 pg/mL — ABNORMAL HIGH (ref 0.0–100.0)

## 2024-06-23 MED ORDER — METOPROLOL SUCCINATE ER 50 MG PO TB24
50.0000 mg | ORAL_TABLET | Freq: Once | ORAL | Status: AC
  Administered 2024-06-23: 50 mg via ORAL
  Filled 2024-06-23: qty 1

## 2024-06-23 MED ORDER — METOPROLOL SUCCINATE ER 100 MG PO TB24
100.0000 mg | ORAL_TABLET | Freq: Every day | ORAL | Status: DC
  Administered 2024-06-24 – 2024-06-25 (×2): 100 mg via ORAL
  Filled 2024-06-23 (×2): qty 1

## 2024-06-23 MED ORDER — FUROSEMIDE 10 MG/ML IJ SOLN
60.0000 mg | Freq: Once | INTRAMUSCULAR | Status: AC
Start: 2024-06-23 — End: 2024-06-23
  Administered 2024-06-23: 60 mg via INTRAVENOUS
  Filled 2024-06-23: qty 6

## 2024-06-23 MED ORDER — HYDRALAZINE HCL 20 MG/ML IJ SOLN: 10.0000 mg | Freq: Four times a day (QID) | INTRAMUSCULAR | Status: DC | PRN

## 2024-06-23 MED ORDER — ALUM & MAG HYDROXIDE-SIMETH 200-200-20 MG/5ML PO SUSP
15.0000 mL | Freq: Once | ORAL | Status: AC | PRN
  Administered 2024-06-23: 15 mL via ORAL
  Filled 2024-06-23: qty 30

## 2024-06-23 NOTE — Progress Notes (Signed)
 PROGRESS NOTE    Phyllis Knight  FMW:[id number] DOB: 08-02-1941 DOA: 06/16/2024 PCP: Claudene Pellet, MD  Chief Complaint  Patient presents with   Shortness of Breath    Brief Narrative:    83yo F with history of  Dementia, fibromyalgia, hx uterine cancer, afib on Eliquis , GERD, HLD . Presented with SOB, increased confusion, and dysuria x3 days. Was seen 7/5 for fall on Eliquis , found to have closed orbital fracture.    Reported decreased functional ability over the last year. Increased confusion and incontinence over the last week which is new for her per family.  In the ER she was diagnosed with acute hypoxic respiratory failure placed on BiPAP was diagnosed with CHF along with possible pneumonia admitted to ICU.  She was stabilized and transferred to [facility] 06/19/2024 on day 3 of her hospital stay.   Significant Events 7/16 7/16 admitted to ICU, empiric Vancomycin  and Zosyn  started    Echocardiogram 7/17 showed EF 25-30%, moderately reduced RV systolic function, mildly elevated PA systolic pressure, mild MR, moderate TR.   Assessment & Plan:   Principal Problem:   Shock (HCC) Active Problems:   Atrial fibrillation with RVR (HCC)   Congestive heart failure (HCC)   Acute combined systolic and diastolic heart failure (HCC)   Parainfluenza virus bronchopneumonia  Shock Related to sepsis and cardiogenic shock Concern for UTI/parainfluenza infection UA at presentation not c/w UTI, negative urine culture Negative blood cultures Management as below  Heart Failure with Reduced EF with Exacerbation Echo 7/17 with EF 25-30$, global hypokinesis, moderately reduced RVSF, IVC dilated with <50% resp variability Appreciate cardiology assistance Diuresis per cards Spironolactone  12.5 mg daily.  Metoprolol  50 mg daily.   Strict I/O, daily weights  Acute hypoxic respiratory failure Parainfluenza Virus Infection  Community Acquired Pneumonia With contribution from HF above as well CXR  with pulmonary vascular congestion and enlargement of cardiopericardial silhouette Negative covid, flu, RSV Parainfluenza virus positive  Treated with abx for possible bacterial component    Paroxysmal atrial fibrillation   Eliquis , metoprolol  50 mg daily.  Amiodarone  gtt.   Appreciate cards assistance   Dyslipidemia.  On statin continue.   Underlying dementia.  At risk for delirium/metabolic encephalopathy, continue Seroquel  bedtime and monitor, minimize narcotics and benzodiazepines Delirium precautions   Right Orbital Floor Fracture  Case discussed with Dr. Octavia on 7/5, plan for outpatient follow up   Hypokalemia.  Replaced.     GERD.  PPI.  Goals of care Palliative following     DVT prophylaxis: eliquis  Code Status: DNR Family Communication: husband at bedside Disposition:   Status is: Inpatient Remains inpatient appropriate because: need for ongoing inpatient care   Consultants:  Cards palliative  Procedures:  Echo IMPRESSIONS     1. Left ventricular ejection fraction, by estimation, is 25 to 30%. The  left ventricle has severely decreased function. The left ventricle  demonstrates global hypokinesis. Left ventricular diastolic function could  not be evaluated.   2. Right ventricular systolic function is moderately reduced. The right  ventricular size is moderately enlarged. There is mildly elevated  pulmonary artery systolic pressure. The estimated right ventricular  systolic pressure is 44.6 mmHg.   3. Left atrial size was mildly dilated.   4. Right atrial size was mildly dilated.   5. The mitral valve is normal in structure. Mild mitral valve  regurgitation. No evidence of mitral stenosis.   6. Tricuspid valve regurgitation is moderate.   7. The aortic valve is normal in structure.  Aortic valve regurgitation is  mild. No aortic stenosis is present.   8. The inferior vena cava is dilated in size with <50% respiratory  variability, suggesting right  atrial pressure of 15 mmHg.   Antimicrobials:  Anti-infectives (From admission, onward)    Start     Dose/Rate Route Frequency Ordered Stop   06/17/24 2000  vancomycin  (VANCOCIN ) IVPB 1000 mg/200 mL premix  Status:  Discontinued        1,000 mg 200 mL/hr over 60 Minutes Intravenous Every 24 hours 06/16/24 1803 06/17/24 0934   06/16/24 2200  piperacillin -tazobactam (ZOSYN ) IVPB 3.375 g       Placed in Followed by Linked Group   3.375 g 12.5 mL/hr over 240 Minutes Intravenous Every 8 hours 06/16/24 1729 06/20/24 0206   06/16/24 1815  vancomycin  (VANCOREADY) IVPB 500 mg/100 mL        500 mg 100 mL/hr over 60 Minutes Intravenous  Once 06/16/24 1803 06/16/24 2244   06/16/24 1730  piperacillin -tazobactam (ZOSYN ) IVPB 3.375 g       Placed in Followed by Linked Group   3.375 g 100 mL/hr over 30 Minutes Intravenous  Once 06/16/24 1729 06/16/24 1859   06/16/24 1315  aztreonam  (AZACTAM ) 2 g in sodium chloride  0.9 % 100 mL IVPB        2 g 200 mL/hr over 30 Minutes Intravenous  Once 06/16/24 1306 06/16/24 1510   06/16/24 1315  metroNIDAZOLE  (FLAGYL ) IVPB 500 mg        500 mg 100 mL/hr over 60 Minutes Intravenous  Once 06/16/24 1306 06/16/24 1510   06/16/24 1315  vancomycin  (VANCOCIN ) IVPB 1000 mg/200 mL premix        1,000 mg 200 mL/hr over 60 Minutes Intravenous  Once 06/16/24 1306 06/16/24 1626       Subjective: No complaints, c/o being tired/sleepy Husband at bedside  Objective: Vitals:   06/23/24 0330 06/23/24 0400 06/23/24 0800 06/23/24 1131  BP:  (!) 123/91 92/63 120/77  Pulse:  81    Resp:  20    Temp:  98.6 F (37 C) (!) 97.5 F (36.4 C) (!) 97.5 F (36.4 C)  TempSrc:  Oral Oral Oral  SpO2:  98%    Weight: 77.6 kg       Intake/Output Summary (Last 24 hours) at 06/23/2024 1329 Last data filed at 06/23/2024 0600 Gross per 24 hour  Intake 50 ml  Output 1550 ml  Net -1500 ml   Filed Weights   06/21/24 0500 06/22/24 0554 06/23/24 0330  Weight: 80.5 kg 77.4 kg 77.6  kg    Examination:  General exam: chronically ill appearing Respiratory system: scattered coarse lung sounds on anterior exam Cardiovascular system: RRR Gastrointestinal system: Abdomen is nondistended, soft and nontender.  Central nervous system: Alert and oriented. No focal neurological deficits. Extremities: bilateral LE edema, dependent edema to hips    Data Reviewed: I have personally reviewed following labs and imaging studies  CBC: Recent Labs  Lab 06/19/24 0549 06/20/24 0709 06/21/24 0615 06/22/24 0542 06/23/24 0451  WBC 5.4 4.3 5.0 4.8 4.8  NEUTROABS  --   --   --  2.8 2.7  HGB 11.7* 10.7* 11.6* 11.8* 12.4  HCT 36.3 33.9* 36.5 35.3* 35.1*  MCV 97.3 98.8 98.9 101.1* 100.9*  PLT 108* 114* 174 192 190    Basic Metabolic Panel: Recent Labs  Lab 06/17/24 0420 06/18/24 0412 06/19/24 0549 06/20/24 0709 06/21/24 0615 06/22/24 0542 06/23/24 0451  NA 136 138 139 140 140  138 136  K 3.5 3.5 3.5 3.0* 4.6 4.5 4.5  CL 102 102 101 103 99 99 96*  CO2 21* 21* 24 25 31 29 28   GLUCOSE 173* 107* 81 81 96 96 92  BUN 15 20 20 13 10 10 13   CREATININE 0.95 1.28* 1.09* 0.82 0.78 0.82 0.71  CALCIUM  8.7* 8.5* 8.9 8.4* 9.0 9.1 9.4  MG 1.6* 2.4 2.3 1.8 2.0 1.7 2.0  PHOS 3.5 3.4 2.9 3.8 3.1  --   --     GFR: Estimated Creatinine Clearance: 55.7 mL/min (by C-G formula based on SCr of 0.71 mg/dL).  Liver Function Tests: Recent Labs  Lab 06/19/24 0549 06/20/24 0709 06/21/24 0615 06/22/24 0542 06/23/24 0451  AST 145* 98* 75* 51* 46*  ALT 107* 81* 74* 59* 49*  ALKPHOS 59 53 67 70 69  BILITOT 1.6* 1.9* 1.3* 1.2 1.6*  PROT 7.0 6.3* 6.8 6.8 6.6  ALBUMIN  3.4* 2.8* 2.9* 2.8* 2.8*    CBG: Recent Labs  Lab 06/18/24 1750 06/18/24 1921 06/18/24 2305 06/20/24 0636 06/22/24 1228  GLUCAP 82 84 106* 72 123*     Recent Results (from the past 240 hours)  Urine Culture (for pregnant, neutropenic or urologic patients or patients with an indwelling urinary catheter)      Status: None   Collection Time: 06/16/24 11:48 AM   Specimen: Urine, Clean Catch  Result Value Ref Range Status   Specimen Description URINE, CLEAN CATCH  Final   Special Requests NONE  Final   Culture   Final    NO GROWTH Performed at [facility], [address], [place], [place] [address]    Report Status 06/18/2024 FINAL  Final  Blood Culture (routine x 2)     Status: None   Collection Time: 06/16/24 12:53 PM   Specimen: BLOOD  Result Value Ref Range Status   Specimen Description BLOOD LEFT ANTECUBITAL  Final   Special Requests   Final    BOTTLES DRAWN AEROBIC AND ANAEROBIC Blood Culture adequate volume   Culture   Final    NO GROWTH 5 DAYS Performed at [facility], [address] [place], [place] [address]    Report Status 06/21/2024 FINAL  Final  Resp panel by RT-PCR (RSV, Flu Merryl Buckels&B, Covid) Anterior Nasal Swab     Status: None   Collection Time: 06/16/24  1:05 PM   Specimen: Anterior Nasal Swab  Result Value Ref Range Status   SARS Coronavirus 2 by RT PCR NEGATIVE NEGATIVE Final   Influenza Jajaira Ruis by PCR NEGATIVE NEGATIVE Final   Influenza B by PCR NEGATIVE NEGATIVE Final    Comment: (NOTE) The Xpert Xpress SARS-CoV-2/FLU/RSV plus assay is intended as an aid in the diagnosis of influenza from Nasopharyngeal swab specimens and should not be used as Marnette Perkins sole basis for treatment. Nasal washings and aspirates are unacceptable for Xpert Xpress SARS-CoV-2/FLU/RSV testing.  Fact Sheet for Patients: BloggerCourse.com  Fact Sheet for Healthcare Providers: SeriousBroker.it  This test is not yet approved or cleared by the [place]  FDA and has been authorized for detection and/or diagnosis of SARS-CoV-2 by FDA under an Emergency Use Authorization (EUA). This EUA will remain in effect (meaning this test can be used) for the duration of the COVID-19 declaration under Section 564(b)(1) of the Act, 21 U.S.C. section  360bbb-3(b)(1), unless the authorization is terminated or revoked.     Resp Syncytial Virus by PCR NEGATIVE NEGATIVE Final    Comment: (NOTE) Fact Sheet for  Patients: BloggerCourse.com  Fact Sheet for Healthcare Providers: SeriousBroker.it  This test is not yet approved or cleared by the [place]  FDA and has been authorized for detection and/or diagnosis of SARS-CoV-2 by FDA under an Emergency Use Authorization (EUA). This EUA will remain in effect (meaning this test can be used) for the duration of the COVID-19 declaration under Section 564(b)(1) of the Act, 21 U.S.C. section 360bbb-3(b)(1), unless the authorization is terminated or revoked.  Performed at [facility], [address], [place], [place] [address]   Respiratory (~20 pathogens) panel by PCR     Status: Abnormal   Collection Time: 06/16/24  1:33 PM   Specimen: Nasopharyngeal Swab; Respiratory  Result Value Ref Range Status   Adenovirus NOT DETECTED NOT DETECTED Final   Coronavirus 229E NOT DETECTED NOT DETECTED Final    Comment: (NOTE) The Coronavirus on the Respiratory Panel, DOES NOT test for the novel  Coronavirus (2019 nCoV)    Coronavirus HKU1 NOT DETECTED NOT DETECTED Final   Coronavirus NL63 NOT DETECTED NOT DETECTED Final   Coronavirus OC43 NOT DETECTED NOT DETECTED Final   Metapneumovirus NOT DETECTED NOT DETECTED Final   Rhinovirus / Enterovirus NOT DETECTED NOT DETECTED Final   Influenza Channon Brougher NOT DETECTED NOT DETECTED Final   Influenza B NOT DETECTED NOT DETECTED Final   Parainfluenza Virus 1 NOT DETECTED NOT DETECTED Final   Parainfluenza Virus 2 NOT DETECTED NOT DETECTED Final   Parainfluenza Virus 3 DETECTED (Aquarius Latouche) NOT DETECTED Final   Parainfluenza Virus 4 NOT DETECTED NOT DETECTED Final   Respiratory Syncytial Virus NOT DETECTED NOT DETECTED Final   Bordetella pertussis NOT DETECTED NOT DETECTED Final   Bordetella Parapertussis NOT  DETECTED NOT DETECTED Final   Chlamydophila pneumoniae NOT DETECTED NOT DETECTED Final   Mycoplasma pneumoniae NOT DETECTED NOT DETECTED Final    Comment: Performed at [facility], [address], [place], [place] [address]  Blood Culture (routine x 2)     Status: None   Collection Time: 06/16/24  3:06 PM   Specimen: BLOOD RIGHT HAND  Result Value Ref Range Status   Specimen Description BLOOD RIGHT HAND  Final   Special Requests   Final    AEROBIC BOTTLE ONLY Blood Culture results may not be optimal due to an inadequate volume of blood received in culture bottles   Culture   Final    NO GROWTH 5 DAYS Performed at [facility], [address] [place], [place] [address]    Report Status 06/21/2024 FINAL  Final  MRSA Next Gen by PCR, Nasal     Status: None   Collection Time: 06/16/24  6:11 PM   Specimen: Nasal Mucosa; Nasal Swab  Result Value Ref Range Status   MRSA by PCR Next Gen NOT DETECTED NOT DETECTED Final    Comment: (NOTE) The GeneXpert MRSA Assay (FDA approved for NASAL specimens only), is one component of Dandre Sisler comprehensive MRSA colonization surveillance program. It is not intended to diagnose MRSA infection nor to guide or monitor treatment for MRSA infections. Test performance is not FDA approved in patients less than 79 years old. Performed at [facility], [address] [place], [place] [address]          Radiology Studies: DG Chest Port 1 View Result Date: 06/22/2024 CLINICAL DATA:  Shortness of breath and hypertension. EXAM: PORTABLE CHEST 1 VIEW COMPARISON:  06/19/2024 FINDINGS: The cardio pericardial silhouette is enlarged. There is pulmonary vascular congestion. Interstitial  pulmonary edema not excluded. Streaky density in the lower lungs bilaterally suggest atelectasis. No substantial pleural effusion. Telemetry leads overlie the chest. IMPRESSION: Enlargement of the cardiopericardial silhouette with pulmonary vascular congestion.  Interstitial pulmonary edema not excluded. Electronically Signed   By: Camellia Candle M.D.   On: 06/22/2024 07:01        Scheduled Meds:  apixaban   5 mg Oral BID   Chlorhexidine  Gluconate Cloth  6 each Topical Daily   feeding supplement  237 mL Oral BID BM   guaiFENesin   600 mg Oral BID   metoprolol  succinate  50 mg Oral Daily   multivitamin with minerals  1 tablet Oral Daily   mouth rinse  15 mL Mouth Rinse 4 times per day   QUEtiapine   12.5 mg Oral QHS   spironolactone   12.5 mg Oral Daily   Continuous Infusions:  amiodarone  30 mg/hr (06/23/24 0944)     LOS: 7 days    Time spent: 30 min     Meliton Monte, MD [facility]   To contact the attending provider between 7A-7P or the covering provider during after hours 7P-7A, please log into the web site [email or web address] and access using universal [place] password for that web site. If you do not have the password, please call the hospital operator.  06/23/2024, 1:29 PM

## 2024-06-23 NOTE — Progress Notes (Signed)
 Progress Note  Patient Name: Phyllis Knight Date of Encounter: 06/23/2024 [facility] Cardiologist: Newman [name], MD   Interval Summary   On interview patient and her husband reported that she was feeling slightly better than previously.  She has continued to have a productive cough with yellow sputum.  Was able to get up and walk throughout the hallway yesterday.  Denied any lightheadedness or dizziness when walking.  Vital Signs Vitals:   06/23/24 0000 06/23/24 0330 06/23/24 0400 06/23/24 0800  BP: (!) 123/102  (!) 123/91 92/63  Pulse: 97  81   Resp: 17  20   Temp: 98.9 F (37.2 C)  98.6 F (37 C) (!) 97.5 F (36.4 C)  TempSrc: Oral  Oral Oral  SpO2: 98%  98%   Weight:  77.6 kg      Intake/Output Summary (Last 24 hours) at 06/23/2024 1010 Last data filed at 06/23/2024 0600 Gross per 24 hour  Intake 50 ml  Output 2150 ml  Net -2100 ml      06/23/2024    3:30 AM 06/22/2024    5:54 AM 06/21/2024    5:00 AM  Last 3 Weights  Weight (lbs) 171 lb 1.2 oz 170 lb 10.2 oz 177 lb 7.5 oz  Weight (kg) 77.6 kg 77.4 kg 80.5 kg      Telemetry/ECG  Atrial fibrillation with heart rates in the 60s to 90s - Personally Reviewed  Physical Exam  GEN: No acute distress.  Appearing older than stated age.  On 3 L nasal cannula. Neck:  JVD 2-3 cm. Cardiac: Irregularly irregular rhythm, no murmurs, rubs, or gallops.  Respiratory: wheezing and crackles heard through out the lungs. GI: Soft, nontender, non-distended. MS: 2+ bilateral edema.  Assessment & Plan  Phyllis Knight is a 82 y.o. 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 Theophilus    Acute systolic and diastolic heart failure UTI Parainfluenza pneumonia/bronchitis LVEF this admission newly reduced at 25-30% from 60-65% in 2020.  The reduction in the LVEF was felt to be  secondary to the patients acute illness.  Plan to recheck echo in about 3 months to reassess LVEF.  Outpatient ischemic evaluation may be considered at that time.  Parainfluenza infection and UTI are managed by primary team.  Has received multiple doses of IV Lasix .  I/O do not appear to be accurate as minimal intake was documented. Weight is down about 15 pounds since admission. Appeared slightly volume over loaded on exam. Will give another dose of 60 mg IV Lasix . GDMT Continue spironolactone  12.5 mg daily Avoid SGLT2 because of prior UTI's. Continue Metoprolol  succinate to 50mg  daily.   Atrial fibrillation with RVR HTN CHA2DS2-VASc Score = 5 [CHF History: 1, HTN History: 0, Diabetes History: 0, Stroke History: 0, Vascular Disease History: 1, Age Score: 2, Gender Score: 1].  Therefore, the patient's annual risk of stroke is 7.2 %.  Heart rate appears to be well-controlled in the 60's to 90's. K 4.5, Mg 2.0.  At goal of potassium greater than 4 and magnesium  greater than 2.  Blood pressures have been labile at 8 AM blood pressure was 87/66 and 92/63.  When rechecking at 11 AM blood pressures were 121/100 and 139/122 Continue Eliquis  5 mg twice daily. (Correct dose for weight and creatinine) Continue Metoprolol  succinate 50mg  daily as blood pressures were low earlier this am.  Has received 1770mg  of IV amiodarone . As we are unable to increase BB due to BP concerns will continue IV amiodarone  for rate control until we are closer to 5 gm loading dose goal.   Transaminates Continuing to trend down. Most recent AST 46 and ALT 99. Will continue to hold statin.    Otherwise managed per primary Acute hypoxic respiratory failure, resolved Mixed septic/cardiogenic shock, resolved Acute kidney injury, resolved Recent fall with closed orbital fracture Demetia Goals of care - being seen by palliative team.    For questions or updates, please contact [facility] Please consult [email or web address]  for contact info under       Signed, Darrion Wyszynski, PA-C

## 2024-06-23 NOTE — Progress Notes (Signed)
 Occupational Therapy Treatment Patient Details Name: Phyllis Knight MRN: [id number] DOB: [birth year] Today's Date: 06/23/2024   History of present illness Pt is an 83 yo female presenting to [facility] on 06/16/24 with SOB, possible septic shock secondary to UTI. PMH significant for HTN, Atrial Fibrillation on Eliquis , Dementia and HLD   OT comments  Pt making slow progress during acute OT sessions, ambulating with Min A  + RW but needs consistent cues for posture, hand placement, and BOS. Pt able to progress down to 1L of supplemental 02 with stable oxygen. She continues to need total A to CGA for ADLs. OT to continue following pt to progress her as able. Patient will benefit from continued inpatient follow up therapy, <3 hours/day       If plan is discharge home, recommend the following:  A lot of help with walking and/or transfers;A lot of help with bathing/dressing/bathroom;Assistance with cooking/housework;Direct supervision/assist for financial management;Supervision due to cognitive status;Help with stairs or ramp for entrance;Direct supervision/assist for medications management   Equipment Recommendations  BSC/3in1;Tub/shower seat    Recommendations for Other Services      Precautions / Restrictions Precautions Precautions: Fall Recall of Precautions/Restrictions: Impaired Precaution/Restrictions Comments: incontinent of urine and possibly stool Restrictions Weight Bearing Restrictions Per Provider Order: No       Mobility Bed Mobility Overal bed mobility: Needs Assistance Bed Mobility: Supine to Sit     Supine to sit: Min assist     General bed mobility comments: min A to assist with BLEs, cues and increased time needed for bed mobility    Transfers Overall transfer level: Needs assistance Equipment used: Rolling walker (2 wheels) Transfers: Sit to/from Stand Sit to Stand: Min assist           General transfer comment: cues for hand placement with RW use. Pt  initially mod A progressed to min A  for two STS with reinforced cues for hand placement     Balance Overall balance assessment: Needs assistance Sitting-balance support: No upper extremity supported, Feet supported Sitting balance-Leahy Scale: Fair Sitting balance - Comments: EOB with CGA   Standing balance support: Bilateral upper extremity supported, During functional activity, Reliant on assistive device for balance Standing balance-Leahy Scale: Poor Standing balance comment: RW reliant                           ADL either performed or assessed with clinical judgement   ADL       Grooming: Sitting;Wash/dry face;Contact guard assist           Upper Body Dressing : Moderate assistance;Sitting Upper Body Dressing Details (indicate cue type and reason): don gown like jacket Lower Body Dressing: Sitting/lateral leans;Total assistance Lower Body Dressing Details (indicate cue type and reason): doff/don socks             Functional mobility during ADLs: Minimal assistance;Rolling walker (2 wheels)      Extremity/Trunk Assessment Upper Extremity Assessment Upper Extremity Assessment: Generalized weakness   Lower Extremity Assessment Lower Extremity Assessment: Generalized weakness        Vision       Perception     Praxis     Communication Communication Communication: No apparent difficulties   Cognition Arousal: Alert Behavior During Therapy: WFL for tasks assessed/performed Cognition: History of cognitive impairments             OT - Cognition Comments: hx of dementia at baseline.  Following commands: Impaired Following commands impaired: Only follows one step commands consistently      Cueing   Cueing Techniques: Verbal cues, Tactile cues  Exercises      Shoulder Instructions       General Comments VSS on 1L/ Sp02 98%    Pertinent Vitals/ Pain       Pain Assessment Pain Assessment: No/denies  pain  Home Living                                          Prior Functioning/Environment              Frequency  Min 2X/week        Progress Toward Goals  OT Goals(current goals can now be found in the care plan section)  Progress towards OT goals: Progressing toward goals  Acute Rehab OT Goals Patient Stated Goal: To get better and eventually return home OT Goal Formulation: With family Time For Goal Achievement: 07/03/24 Potential to Achieve Goals: Fair  Plan      Co-evaluation                 AM-PAC OT 6 Clicks Daily Activity     Outcome Measure   Help from another person eating meals?: A Little Help from another person taking care of personal grooming?: A Little Help from another person toileting, which includes using toliet, bedpan, or urinal?: A Lot Help from another person bathing (including washing, rinsing, drying)?: A Lot Help from another person to put on and taking off regular upper body clothing?: A Lot Help from another person to put on and taking off regular lower body clothing?: Total 6 Click Score: 13    End of Session Equipment Utilized During Treatment: Gait belt;Rolling walker (2 wheels)  OT Visit Diagnosis: Unsteadiness on feet (R26.81);Other abnormalities of gait and mobility (R26.89);Muscle weakness (generalized) (M62.81);Pain Pain - Right/Left:  (bilat) Pain - part of body: Leg   Activity Tolerance Patient tolerated treatment well   Patient Left in bed;with call bell/phone within reach;with chair alarm set;with family/visitor present   Nurse Communication Mobility status        Time: 1530-1609 OT Time Calculation (min): 39 min  Charges: OT General Charges $OT Visit: 1 Visit OT Treatments $Self Care/Home Management : 8-22 mins $Therapeutic Activity: 23-37 mins  06/23/2024  AB, OTR/L  Acute Rehabilitation Services  Office: [phone]   Phyllis Knight 06/23/2024, 4:24 PM

## 2024-06-23 NOTE — TOC Progression Note (Addendum)
 Transition of Care [facility]) - Progression Note    Patient Details  Name: Phyllis Knight MRN: [id number] Date of Birth: [birth year]  Transition of Care [facility]) CM/SW Contact  Inocente GORMAN Kindle, LCSW Phone Number: 06/23/2024, 11:48 AM  Clinical Narrative:    11:48 AM-CSW received call from patient's daughter. She is requesting [facility]. CSW awaiting confirmation of bed availability from [facility] and insurance process. CSW answered questions about post SNF care options.  1:09 PM-Awaiting response from [facility].   2:26 PM-[facility] is reviewing. Their usual admissions person is out this week and they have been having difficulty learning insurance processes.    Expected Discharge Plan: Skilled Nursing Facility Barriers to Discharge: Continued Medical Work up, SNF Pending bed offer, English as a second language teacher               Expected Discharge Plan and Services In-house Referral: Clinical Social Work     Living arrangements for the past 2 months: Single Family Home                                       Social Drivers of Health (SDOH) Interventions SDOH Screenings   Food Insecurity: Patient Unable To Answer (06/17/2024)  Housing: Unknown (06/17/2024)  Transportation Needs: Patient Unable To Answer (06/17/2024)  Utilities: Patient Unable To Answer (06/17/2024)  Social Connections: Patient Unable To Answer (06/17/2024)  Tobacco Use: Low Risk  (06/16/2024)    Readmission Risk Interventions     No data to display

## 2024-06-23 NOTE — Plan of Care (Signed)

## 2024-06-23 NOTE — Progress Notes (Signed)
   Palliative Medicine Inpatient Follow Up Note HPI: 83 y.o. female  with past medical history of dementia, fibromyalgia, hx uterine cancer, afib on Eliquis , GERD, and HLD admitted on 06/16/2024 with new onset shortness of breath.  Diagnosed with mixed septic and cardiogenic shock. Echo LVEF 25-30%, LV severely decreased function and global hypokinesis, RV systolic function moderately reduced. PMT consulted to discuss GOC.   Today's Discussion 06/23/2024  *Please note that this is a verbal dictation therefore any spelling or grammatical errors are due to the Dragon Medical One system interpretation.  Chart reviewed inclusive of vital signs, progress notes, laboratory results, and diagnostic images. HR under better control. Remains on supplemental O2.   Phyllis Knight is resting comfortably this morning in NAD. She is pleasantly disoriented.   There is no family present at bedside this morning.   Plan for SNF placement in the oncoming day(s).   Questions and concerns addressed/Palliative Support Provided.   Objective Assessment: Vital Signs Vitals:   06/23/24 0400 06/23/24 0800  BP: (!) 123/91 92/63  Pulse: 81   Resp: 20   Temp: 98.6 F (37 C) (!) 97.5 F (36.4 C)  SpO2: 98%     Intake/Output Summary (Last 24 hours) at 06/23/2024 1055 Last data filed at 06/23/2024 0600 Gross per 24 hour  Intake 50 ml  Output 2150 ml  Net -2100 ml   Last Weight  Most recent update: 06/23/2024  3:30 AM    Weight  77.6 kg (171 lb 1.2 oz)            Gen:  Elderly AA F chronically ill appearing HEENT: L bruise around eye, moist mucous membranes CV: Regular rate and rhythm  PULM: On 3LPM [place], breathing is even and nonlabored ABD: soft/nontender  EXT: (+) BLE edema Neuro: Aware of self this morning  SUMMARY OF RECOMMENDATIONS   DNAR  Plan for SNF placement in the next 1-2 days if remains stable  Plan for OP Palliative support on discharge  PMT will follow as  needed ______________________________________________________________________________________ Phyllis Knight [place] Palliative Medicine Team Team Cell Phone: [phone] Please utilize secure chat with additional questions, if there is no response within 30 minutes please call the above phone number  Time: 29  Palliative Medicine Team providers are available by phone from 7am to 7pm daily and can be reached through the team cell phone.  Should this patient require assistance outside of these hours, please call the patient's attending physician.

## 2024-06-24 DIAGNOSIS — I5041 Acute combined systolic (congestive) and diastolic (congestive) heart failure: Secondary | ICD-10-CM | POA: Diagnosis not present

## 2024-06-24 DIAGNOSIS — R579 Shock, unspecified: Secondary | ICD-10-CM | POA: Diagnosis not present

## 2024-06-24 DIAGNOSIS — I4891 Unspecified atrial fibrillation: Secondary | ICD-10-CM | POA: Diagnosis not present

## 2024-06-24 LAB — COMPREHENSIVE METABOLIC PANEL WITH GFR
ALT: 39 U/L (ref 0–44)
AST: 33 U/L (ref 15–41)
Albumin: 2.6 g/dL — ABNORMAL LOW (ref 3.5–5.0)
Alkaline Phosphatase: 70 U/L (ref 38–126)
Anion gap: 8 (ref 5–15)
BUN: 17 mg/dL (ref 8–23)
CO2: 33 mmol/L — ABNORMAL HIGH (ref 22–32)
Calcium: 9.1 mg/dL (ref 8.9–10.3)
Chloride: 96 mmol/L — ABNORMAL LOW (ref 98–111)
Creatinine, Ser: 0.9 mg/dL (ref 0.44–1.00)
GFR, Estimated: >60 mL/min (ref >60)
Glucose, Bld: 93 mg/dL (ref 70–99)
Potassium: 4.1 mmol/L (ref 3.5–5.1)
Sodium: 137 mmol/L (ref 135–145)
Total Bilirubin: 1.2 mg/dL (ref 0.0–1.2)
Total Protein: 6.6 g/dL (ref 6.5–8.1)

## 2024-06-24 LAB — CBC WITH DIFFERENTIAL/PLATELET
Abs Immature Granulocytes: 0.03 K/uL (ref 0.00–0.07)
Basophils Absolute: 0 K/uL (ref 0.0–0.1)
Basophils Relative: 0 %
Eosinophils Absolute: 0.2 K/uL (ref 0.0–0.5)
Eosinophils Relative: 3 %
HCT: 38.2 % (ref 36.0–46.0)
Hemoglobin: 12.5 g/dL (ref 12.0–15.0)
Immature Granulocytes: 1 %
Lymphocytes Relative: 24 %
Lymphs Abs: 1.1 K/uL (ref 0.7–4.0)
MCH: 32.5 pg (ref 26.0–34.0)
MCHC: 32.7 g/dL (ref 30.0–36.0)
MCV: 99.2 fL (ref 80.0–100.0)
Monocytes Absolute: 0.5 K/uL (ref 0.1–1.0)
Monocytes Relative: 11 %
Neutro Abs: 2.8 K/uL (ref 1.7–7.7)
Neutrophils Relative %: 61 %
Platelets: 224 K/uL (ref 150–400)
RBC: 3.85 MIL/uL — ABNORMAL LOW (ref 3.87–5.11)
RDW: 14.6 % (ref 11.5–15.5)
WBC: 4.5 K/uL (ref 4.0–10.5)
nRBC: 0 % (ref 0.0–0.2)

## 2024-06-24 LAB — MAGNESIUM: Magnesium: 1.7 mg/dL (ref 1.7–2.4)

## 2024-06-24 LAB — BRAIN NATRIURETIC PEPTIDE: B Natriuretic Peptide: 576.2 pg/mL — ABNORMAL HIGH (ref 0.0–100.0)

## 2024-06-24 MED ORDER — MAGNESIUM SULFATE 2 GM/50ML IV SOLN
2.0000 g | Freq: Once | INTRAVENOUS | Status: AC
  Administered 2024-06-24: 2 g via INTRAVENOUS
  Filled 2024-06-24: qty 50

## 2024-06-24 MED ORDER — ROSUVASTATIN CALCIUM 20 MG PO TABS
20.0000 mg | ORAL_TABLET | Freq: Every day | ORAL | Status: DC
  Administered 2024-06-24 – 2024-06-25 (×2): 20 mg via ORAL
  Filled 2024-06-24 (×2): qty 1

## 2024-06-24 MED ORDER — FUROSEMIDE 10 MG/ML IJ SOLN
60.0000 mg | Freq: Once | INTRAMUSCULAR | Status: AC
  Administered 2024-06-24: 60 mg via INTRAVENOUS
  Filled 2024-06-24: qty 6

## 2024-06-24 NOTE — Progress Notes (Signed)
 Nutrition Follow-up  DOCUMENTATION CODES:   Not applicable - while she does not currently meet criteria for malnutrition, she is at significantly increased risk d/t moderate edema to BLEs likely masking additional weight loss and muscle/fat wating as well as dx of dementia. Suspect malnutrition is present.  INTERVENTION:  Continue regular diet as ordered Continue Ensure Plus High Protein po BID, each supplement provides 350 kcal and 20 grams of protein  Continue [employer] Essentials TID, each packet mixed with 8 ounces of 2% milk provides 13 grams of protein and 260 calories. Magic cup BID with meals, each supplement provides 290 kcal and 9 grams of protein MVI with minerals daily   NUTRITION DIAGNOSIS:  Increased nutrient needs related to acute illness as evidenced by estimated needs.  GOAL:  Patient will meet greater than or equal to 90% of their needs  MONITOR:  PO intake, Supplement acceptance, Labs, Weight trends, I & O's  REASON FOR ASSESSMENT:  Consult Assessment of nutrition requirement/status  ASSESSMENT:   Pt admitted with SOB, increased confusion over the last week found to have mixed septic and cardiogenic shock. PMH significant for dementia, fibromyalgia, uterine cancer, afib, GERD, HLD.  She is resting comfortably at time of bedside visit. Per cardiology, newly reduced LV EF this admission likely 2/2 acute illness. Parainfluenza and UTI are improving. Continues on diuretics. Switched to oral today.     Average Meal Intake No documented meal intake to review  Husband at bedside and states patient intake has improved since yesterday. States she has been able to feed herself. Previously documented as not preferring Ensure supplements, however husband endorses she is sipping on them between meals. [employer] continue on meal trays.Tolerating regular texture diet. Bowels stable/moving.    Admit Weight: 84.4 kg Current Weight:  76.7 kg + moderate pitting edema to BLEs  Net negative 17lbs this admission. Husband confirms body weight loss. Continues with edema to BLEs, however noted as improving. True body weight likely much lower than currently documented and could be masking additional muscle wasting. While weight loss is considered significant for the time frame, it cannot be used as a viable criteria for malnutrition identification as it is skewing weight loss trends and is not, in fact, actual body weight loss.   Net IO Since Admission: -5,174.8 mL [06/24/24 1441]   Received another dose of IV diuretics today. Transitioned to oral diuretic. Required magnesium  supplementation. Labs stable.  Meds: MVI w/ minerals, spironolactone , 2g Mg sulfate x1  Labs:  Na+ 137 (wdl) K+ 4.1 (wdl) Mg 1.7 (wdl) CRP 8.1 (H) CBGs 92-93 x24 hours   Diet Order:   Diet Order             Diet regular Room service appropriate? Yes; Fluid consistency: Thin  Diet effective now             EDUCATION NEEDS:   Not appropriate for education at this time  Skin:  Skin Assessment: Reviewed RN Assessment  Last BM:  7/24  Height:  Ht Readings from Last 1 Encounters:  06/05/24 5' 9 (1.753 m)   Weight:  Wt Readings from Last 1 Encounters:  06/24/24 76.7 kg   Ideal Body Weight:  65.9 kg  BMI:  Body mass index is 24.97 kg/m.  Estimated Nutritional Needs:   Kcal:  1700-1900  Protein:  85-100g  Fluid:  >/=1.7L  Blair Deaner MS, RD, LDN Registered Dietitian Clinical Nutrition RD Inpatient Contact Info in Amion

## 2024-06-24 NOTE — TOC Progression Note (Addendum)
 Transition of Care [facility]) - Progression Note    Patient Details  Name: Phyllis Knight MRN: [id number] Date of Birth: [birth year]  Transition of Care [facility]) CM/SW Contact  Phyllis GORMAN Kindle, LCSW Phone Number: 06/24/2024, 12:30 PM  Clinical Narrative:    12:30pm-[facility] unsure how to process insurance authorization. CSW submitted authorization request through [employer] as an expedited request, Request [id number].   3:59 PM-CSW received call from [facility] Arleen Server) who stated authorization has been approved, effective 06/24/24-06/29/24. CSW updated [place] with [facility]. She stated she was waiting on nursing to approve the patient.  5pm-Per [place], they can accept patient tomorrow.     Expected Discharge Plan: Skilled Nursing Facility Barriers to Discharge: Continued Medical Work up, SNF Pending bed offer, English as a second language teacher               Expected Discharge Plan and Services In-house Referral: Clinical Social Work     Living arrangements for the past 2 months: Single Family Home                                       Social Drivers of Health (SDOH) Interventions SDOH Screenings   Food Insecurity: Patient Unable To Answer (06/17/2024)  Housing: Unknown (06/17/2024)  Transportation Needs: Patient Unable To Answer (06/17/2024)  Utilities: Patient Unable To Answer (06/17/2024)  Social Connections: Patient Unable To Answer (06/17/2024)  Tobacco Use: Low Risk  (06/16/2024)    Readmission Risk Interventions     No data to display

## 2024-06-24 NOTE — Plan of Care (Signed)

## 2024-06-24 NOTE — Progress Notes (Addendum)
 Progress Note  Patient Name: Phyllis Knight Date of Encounter: 06/24/2024 [facility] Cardiologist: Newman [name], MD   Interval Summary   Laying in bed comfortably during visit. Feels like shortness of breath and wheezing are improving  Vital Signs Vitals:   06/23/24 2000 06/23/24 2305 06/24/24 0400 06/24/24 0500  BP: (!) 122/94 111/86 94/80   Pulse: 88 74 96   Resp: 19 20 16    Temp: 97.8 F (36.6 C) 98.2 F (36.8 C) 98 F (36.7 C)   TempSrc: Oral Oral Oral   SpO2: 100% 93% 94%   Weight:    76.7 kg    Intake/Output Summary (Last 24 hours) at 06/24/2024 0734 Last data filed at 06/23/2024 2245 Gross per 24 hour  Intake --  Output 1700 ml  Net -1700 ml      06/24/2024    5:00 AM 06/23/2024    3:30 AM 06/22/2024    5:54 AM  Last 3 Weights  Weight (lbs) 169 lb 1.5 oz 171 lb 1.2 oz 170 lb 10.2 oz  Weight (kg) 76.7 kg 77.6 kg 77.4 kg      Telemetry/ECG  Afib with heart rates in 60's and 70's BPM. - Personally Reviewed  Physical Exam  GEN: No acute distress.   Neck: positive JVD. Cardiac: Irregularly irregular rhythm, rates well controled. no murmurs, rubs, or gallops.  Respiratory: Wheezing and crackles heard through out the lungs. GI: Soft, nontender, non-distended  MS: 1+ bilateral lower extremity edema  Assessment & Plan  Phyllis Knight is a 83 y.o. 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 Theophilus    Acute systolic and diastolic heart failure UTI Parainfluenza pneumonia/bronchitis PTA was on 20 mg oral lasix  for lower extremity edema. LVEF this admission newly reduced at 25-30% from 60-65% in 2020.  The reduction in the LVEF was felt to be secondary to the patients acute illness.  Plan to recheck echo in about 3 months to reassess LVEF.  Outpatient ischemic evaluation may be considered at that time.   Parainfluenza infection and UTI are managed by primary team.  Has received multiple doses of IV Lasix .  I/O do not appear to be accurate as minimal intake was documented. Weight is down about 17 pounds since admission.  Will give another dose of 60mg  IV lasix . Will need outpatient cardiology follow up. GDMT Continue spironolactone  12.5 mg daily Avoid SGLT2 because of prior UTI's. Continue Metoprolol  succinate to 100mg  daily.    Atrial fibrillation with RVR HTN CHA2DS2-VASc Score = 5 [CHF History: 1, HTN History: 0, Diabetes History: 0, Stroke History: 0, Vascular Disease History: 1, Age Score: 2, Gender Score: 1].  Therefore, the patient's annual risk of stroke is 7.2 %.  Heart rate appears to be well-controlled in the 60's to 70's. K 4.1, Mg 1.7. Potassium at goal of greater than 4 will replace magnesium .  Plan for DCCV after has been on eliquis  for 3 weeks and parainfluenza infection has resolved. Continue Eliquis  5 mg twice daily. (Correct dose for weight and creatinine) Continue Metoprolol  succinate 100mg  daily.     Hyperlipidemia Transaminates resolved Restart Crestor  20mg  daily.    Otherwise managed per primary Acute hypoxic respiratory failure, resolved Mixed septic/cardiogenic shock, resolved Acute kidney injury, resolved Transaminates, resolved - has down trended and is no longer elevated. Recent fall with closed orbital fracture [place] Goals of care -  being seen by palliative team.     For questions or updates, please contact [facility] Please consult [email or web address] for contact info under       Signed, Jameil Whitmoyer, PA-C

## 2024-06-24 NOTE — Progress Notes (Signed)
   06/24/24 0910  Mobility  Activity Transferred from bed to chair  Level of Assistance Minimal assist, patient does 75% or more (+2)  Assistive Device Other (Comment) (HHA)  Activity Response Tolerated well  Mobility Referral Yes  Mobility visit 1 Mobility  Mobility Specialist Start Time (ACUTE ONLY) 0910  Mobility Specialist Stop Time (ACUTE ONLY) 0925  Mobility Specialist Time Calculation (min) (ACUTE ONLY) 15 min   Mobility Specialist: Progress Note  Post-Mobility:    HR 70, SpO2 92% 1L  Pt agreeable to mobility session - received in bed. Pt was asymptomatic throughout session with no complaints. Returned to chair with all needs met - call bell within reach. Chair alarm on.  NT present.   Virgle Boards, BS Mobility Specialist Please contact via SecureChat or  Rehab office at [phone].

## 2024-06-24 NOTE — Progress Notes (Signed)
 PROGRESS NOTE    Phyllis Knight  FMW:[id number] DOB: [birth year] DOA: 06/16/2024 PCP: Claudene Pellet, MD  Chief Complaint  Patient presents with   Shortness of Breath    Brief Narrative:    83yo F with history of  Dementia, fibromyalgia, hx uterine cancer, afib on Eliquis , GERD, HLD . Presented with SOB, increased confusion, and dysuria x3 days. Was seen 7/5 for fall on Eliquis , found to have closed orbital fracture.    Reported decreased functional ability over the last year. Increased confusion and incontinence over the last week which is new for her per family.  In the ER she was diagnosed with acute hypoxic respiratory failure placed on BiPAP was diagnosed with CHF along with possible pneumonia admitted to ICU.  She was stabilized and transferred to [facility] 06/19/2024 on day 3 of her hospital stay.   Significant Events 7/16 7/16 admitted to ICU, empiric Vancomycin  and Zosyn  started    Echocardiogram 7/17 showed EF 25-30%, moderately reduced RV systolic function, mildly elevated PA systolic pressure, mild MR, moderate TR.   Assessment & Plan:   Principal Problem:   Shock (HCC) Active Problems:   Atrial fibrillation with RVR (HCC)   Congestive heart failure (HCC)   Acute combined systolic and diastolic heart failure (HCC)   Parainfluenza virus bronchopneumonia  Shock Related to sepsis and cardiogenic shock Concern for UTI/parainfluenza infection UA at presentation not c/w UTI, negative urine culture Negative blood cultures Management as below  Heart Failure with Reduced EF with Exacerbation Echo 7/17 with EF 25-30$, global hypokinesis, moderately reduced RVSF, IVC dilated with <50% resp variability Appreciate cardiology assistance Diuresis per cards Spironolactone  12.5 mg daily.  Metoprolol  50 mg daily.   Strict I/O, daily weights  Acute hypoxic respiratory failure Parainfluenza Virus Infection  Community Acquired Pneumonia With contribution from HF above as well CXR  with pulmonary vascular congestion and enlargement of cardiopericardial silhouette Negative covid, flu, RSV Parainfluenza virus positive  Treated with abx for possible bacterial component    Paroxysmal atrial fibrillation   Eliquis , metoprolol  50 mg daily.   Appreciate cards assistance   Dyslipidemia.  On statin continue.   Underlying dementia.  At risk for delirium/metabolic encephalopathy, continue Seroquel  bedtime and monitor, minimize narcotics and benzodiazepines Delirium precautions   Right Orbital Floor Fracture  Case discussed with Dr. Octavia on 7/5, plan for outpatient follow up   Hypokalemia.  Replaced.     GERD.  PPI.  Goals of care Palliative following     DVT prophylaxis: eliquis  Code Status: DNR Family Communication: none at bedside Disposition:   Status is: Inpatient Remains inpatient appropriate because: need for ongoing inpatient care   Consultants:  Cards palliative  Procedures:  Echo IMPRESSIONS     1. Left ventricular ejection fraction, by estimation, is 25 to 30%. The  left ventricle has severely decreased function. The left ventricle  demonstrates global hypokinesis. Left ventricular diastolic function could  not be evaluated.   2. Right ventricular systolic function is moderately reduced. The right  ventricular size is moderately enlarged. There is mildly elevated  pulmonary artery systolic pressure. The estimated right ventricular  systolic pressure is 44.6 mmHg.   3. Left atrial size was mildly dilated.   4. Right atrial size was mildly dilated.   5. The mitral valve is normal in structure. Mild mitral valve  regurgitation. No evidence of mitral stenosis.   6. Tricuspid valve regurgitation is moderate.   7. The aortic valve is normal in structure. Aortic valve regurgitation  is  mild. No aortic stenosis is present.   8. The inferior vena cava is dilated in size with <50% respiratory  variability, suggesting right atrial pressure of 15  mmHg.   Antimicrobials:  Anti-infectives (From admission, onward)    Start     Dose/Rate Route Frequency Ordered Stop   06/17/24 2000  vancomycin  (VANCOCIN ) IVPB 1000 mg/200 mL premix  Status:  Discontinued        1,000 mg 200 mL/hr over 60 Minutes Intravenous Every 24 hours 06/16/24 1803 06/17/24 0934   06/16/24 2200  piperacillin -tazobactam (ZOSYN ) IVPB 3.375 g       Placed in Followed by Linked Group   3.375 g 12.5 mL/hr over 240 Minutes Intravenous Every 8 hours 06/16/24 1729 06/20/24 0206   06/16/24 1815  vancomycin  (VANCOREADY) IVPB 500 mg/100 mL        500 mg 100 mL/hr over 60 Minutes Intravenous  Once 06/16/24 1803 06/16/24 2244   06/16/24 1730  piperacillin -tazobactam (ZOSYN ) IVPB 3.375 g       Placed in Followed by Linked Group   3.375 g 100 mL/hr over 30 Minutes Intravenous  Once 06/16/24 1729 06/16/24 1859   06/16/24 1315  aztreonam  (AZACTAM ) 2 g in sodium chloride  0.9 % 100 mL IVPB        2 g 200 mL/hr over 30 Minutes Intravenous  Once 06/16/24 1306 06/16/24 1510   06/16/24 1315  metroNIDAZOLE  (FLAGYL ) IVPB 500 mg        500 mg 100 mL/hr over 60 Minutes Intravenous  Once 06/16/24 1306 06/16/24 1510   06/16/24 1315  vancomycin  (VANCOCIN ) IVPB 1000 mg/200 mL premix        1,000 mg 200 mL/hr over 60 Minutes Intravenous  Once 06/16/24 1306 06/16/24 1626       Subjective: No new complaints today  Objective: Vitals:   06/24/24 0400 06/24/24 0500 06/24/24 0829 06/24/24 1112  BP: 94/80  120/85 98/63  Pulse: 96     Resp: 16     Temp: 98 F (36.7 C)  98.1 F (36.7 C) 97.8 F (36.6 C)  TempSrc: Oral  Oral Oral  SpO2: 94%     Weight:  76.7 kg      Intake/Output Summary (Last 24 hours) at 06/24/2024 1426 Last data filed at 06/23/2024 2245 Gross per 24 hour  Intake --  Output 1700 ml  Net -1700 ml   Filed Weights   06/22/24 0554 06/23/24 0330 06/24/24 0500  Weight: 77.4 kg 77.6 kg 76.7 kg    Examination:  General: No acute distress. Chroncially ill  appearing Cardiovascular: RRR Lungs: bilateral rhonchi on anterior exam Abdomen: Soft, nontender, nondistended Neurological: Alert. Moves all extremities 4. Cranial nerves II through XII grossly intact. Extremities: improved LE edema/dependent edema    Data Reviewed: I have personally reviewed following labs and imaging studies  CBC: Recent Labs  Lab 06/20/24 0709 06/21/24 0615 06/22/24 0542 06/23/24 0451 06/24/24 0445  WBC 4.3 5.0 4.8 4.8 4.5  NEUTROABS  --   --  2.8 2.7 2.8  HGB 10.7* 11.6* 11.8* 12.4 12.5  HCT 33.9* 36.5 35.3* 35.1* 38.2  MCV 98.8 98.9 101.1* 100.9* 99.2  PLT 114* 174 192 190 224    Basic Metabolic Panel: Recent Labs  Lab 06/18/24 0412 06/19/24 0549 06/20/24 0709 06/21/24 0615 06/22/24 0542 06/23/24 0451 06/24/24 0445  NA 138 139 140 140 138 136 137  K 3.5 3.5 3.0* 4.6 4.5 4.5 4.1  CL 102 101 103 99 99 96* 96*  CO2 21* 24 25 31 29 28  33*  GLUCOSE 107* 81 81 96 96 92 93  BUN 20 20 13 10 10 13 17   CREATININE 1.28* 1.09* 0.82 0.78 0.82 0.71 0.90  CALCIUM  8.5* 8.9 8.4* 9.0 9.1 9.4 9.1  MG 2.4 2.3 1.8 2.0 1.7 2.0 1.7  PHOS 3.4 2.9 3.8 3.1  --   --   --     GFR: Estimated Creatinine Clearance: 49.5 mL/min (by C-G formula based on SCr of 0.9 mg/dL).  Liver Function Tests: Recent Labs  Lab 06/20/24 0709 06/21/24 0615 06/22/24 0542 06/23/24 0451 06/24/24 0445  AST 98* 75* 51* 46* 33  ALT 81* 74* 59* 49* 39  ALKPHOS 53 67 70 69 70  BILITOT 1.9* 1.3* 1.2 1.6* 1.2  PROT 6.3* 6.8 6.8 6.6 6.6  ALBUMIN  2.8* 2.9* 2.8* 2.8* 2.6*    CBG: Recent Labs  Lab 06/18/24 1750 06/18/24 1921 06/18/24 2305 06/20/24 0636 06/22/24 1228  GLUCAP 82 84 106* 72 123*     Recent Results (from the past 240 hours)  Urine Culture (for pregnant, neutropenic or urologic patients or patients with an indwelling urinary catheter)     Status: None   Collection Time: 06/16/24 11:48 AM   Specimen: Urine, Clean Catch  Result Value Ref Range Status   Specimen  Description URINE, CLEAN CATCH  Final   Special Requests NONE  Final   Culture   Final    NO GROWTH Performed at [facility], [address], [place], [place] [address]    Report Status 06/18/2024 FINAL  Final  Blood Culture (routine x 2)     Status: None   Collection Time: 06/16/24 12:53 PM   Specimen: BLOOD  Result Value Ref Range Status   Specimen Description BLOOD LEFT ANTECUBITAL  Final   Special Requests   Final    BOTTLES DRAWN AEROBIC AND ANAEROBIC Blood Culture adequate volume   Culture   Final    NO GROWTH 5 DAYS Performed at [facility], [address], [place], [place] [address]    Report Status 06/21/2024 FINAL  Final  Resp panel by RT-PCR (RSV, Flu Randeep Biondolillo&B, Covid) Anterior Nasal Swab     Status: None   Collection Time: 06/16/24  1:05 PM   Specimen: Anterior Nasal Swab  Result Value Ref Range Status   SARS Coronavirus 2 by RT PCR NEGATIVE NEGATIVE Final   Influenza Merlin Golden by PCR NEGATIVE NEGATIVE Final   Influenza B by PCR NEGATIVE NEGATIVE Final    Comment: (NOTE) The Xpert Xpress SARS-CoV-2/FLU/RSV plus assay is intended as an aid in the diagnosis of influenza from Nasopharyngeal swab specimens and should not be used as Pamella Samons sole basis for treatment. Nasal washings and aspirates are unacceptable for Xpert Xpress SARS-CoV-2/FLU/RSV testing.  Fact Sheet for Patients: BloggerCourse.com  Fact Sheet for Healthcare Providers: SeriousBroker.it  This test is not yet approved or cleared by the [place]  FDA and has been authorized for detection and/or diagnosis of SARS-CoV-2 by FDA under an Emergency Use Authorization (EUA). This EUA will remain in effect (meaning this test can be used) for the duration of the COVID-19 declaration under Section 564(b)(1) of the Act, 21 U.S.C. section 360bbb-3(b)(1), unless the authorization is terminated or revoked.     Resp Syncytial Virus by PCR NEGATIVE NEGATIVE Final     Comment: (NOTE) Fact Sheet for Patients: BloggerCourse.com  Fact Sheet for Healthcare Providers: SeriousBroker.it  This test is not yet approved or cleared by  the [place]  FDA and has been authorized for detection and/or diagnosis of SARS-CoV-2 by FDA under an Emergency Use Authorization (EUA). This EUA will remain in effect (meaning this test can be used) for the duration of the COVID-19 declaration under Section 564(b)(1) of the Act, 21 U.S.C. section 360bbb-3(b)(1), unless the authorization is terminated or revoked.  Performed at [facility] Lab, [address], [place], [place] [address]   Respiratory (~20 pathogens) panel by PCR     Status: Abnormal   Collection Time: 06/16/24  1:33 PM   Specimen: Nasopharyngeal Swab; Respiratory  Result Value Ref Range Status   Adenovirus NOT DETECTED NOT DETECTED Final   Coronavirus 229E NOT DETECTED NOT DETECTED Final    Comment: (NOTE) The Coronavirus on the Respiratory Panel, DOES NOT test for the novel  Coronavirus (2019 nCoV)    Coronavirus HKU1 NOT DETECTED NOT DETECTED Final   Coronavirus NL63 NOT DETECTED NOT DETECTED Final   Coronavirus OC43 NOT DETECTED NOT DETECTED Final   Metapneumovirus NOT DETECTED NOT DETECTED Final   Rhinovirus / Enterovirus NOT DETECTED NOT DETECTED Final   Influenza Nelida Mandarino NOT DETECTED NOT DETECTED Final   Influenza B NOT DETECTED NOT DETECTED Final   Parainfluenza Virus 1 NOT DETECTED NOT DETECTED Final   Parainfluenza Virus 2 NOT DETECTED NOT DETECTED Final   Parainfluenza Virus 3 DETECTED (Henrine Hayter) NOT DETECTED Final   Parainfluenza Virus 4 NOT DETECTED NOT DETECTED Final   Respiratory Syncytial Virus NOT DETECTED NOT DETECTED Final   Bordetella pertussis NOT DETECTED NOT DETECTED Final   Bordetella Parapertussis NOT DETECTED NOT DETECTED Final   Chlamydophila pneumoniae NOT DETECTED NOT DETECTED Final   Mycoplasma pneumoniae NOT DETECTED NOT DETECTED  Final    Comment: Performed at [facility], [address] [place], [place] [address]  Blood Culture (routine x 2)     Status: None   Collection Time: 06/16/24  3:06 PM   Specimen: BLOOD RIGHT HAND  Result Value Ref Range Status   Specimen Description BLOOD RIGHT HAND  Final   Special Requests   Final    AEROBIC BOTTLE ONLY Blood Culture results may not be optimal due to an inadequate volume of blood received in culture bottles   Culture   Final    NO GROWTH 5 DAYS Performed at [facility], [address], [place], [place] [address]    Report Status 06/21/2024 FINAL  Final  MRSA Next Gen by PCR, Nasal     Status: None   Collection Time: 06/16/24  6:11 PM   Specimen: Nasal Mucosa; Nasal Swab  Result Value Ref Range Status   MRSA by PCR Next Gen NOT DETECTED NOT DETECTED Final    Comment: (NOTE) The GeneXpert MRSA Assay (FDA approved for NASAL specimens only), is one component of Junell Cullifer comprehensive MRSA colonization surveillance program. It is not intended to diagnose MRSA infection nor to guide or monitor treatment for MRSA infections. Test performance is not FDA approved in patients less than 88 years old. Performed at [facility], [address] [place], [place] [address]          Radiology Studies: No results found.       Scheduled Meds:  apixaban   5 mg Oral BID   Chlorhexidine  Gluconate Cloth  6 each Topical Daily   feeding supplement  237 mL Oral BID BM   guaiFENesin   600 mg Oral BID   metoprolol  succinate  100 mg Oral Daily  multivitamin with minerals  1 tablet Oral Daily   mouth rinse  15 mL Mouth Rinse 4 times per day   QUEtiapine   12.5 mg Oral QHS   rosuvastatin   20 mg Oral Daily   spironolactone   12.5 mg Oral Daily   Continuous Infusions:     LOS: 8 days    Time spent: 30 min     Meliton Monte, MD [facility]   To contact the attending provider between 7A-7P or the covering provider during after hours 7P-7A,  please log into the web site [email or web address] and access using universal [place] password for that web site. If you do not have the password, please call the hospital operator.  06/24/2024, 2:26 PM

## 2024-06-25 ENCOUNTER — Other Ambulatory Visit: Payer: Self-pay | Admitting: Student

## 2024-06-25 ENCOUNTER — Other Ambulatory Visit: Payer: Self-pay

## 2024-06-25 ENCOUNTER — Inpatient Hospital Stay (HOSPITAL_COMMUNITY)

## 2024-06-25 DIAGNOSIS — I5041 Acute combined systolic (congestive) and diastolic (congestive) heart failure: Secondary | ICD-10-CM

## 2024-06-25 DIAGNOSIS — R579 Shock, unspecified: Secondary | ICD-10-CM | POA: Diagnosis not present

## 2024-06-25 DIAGNOSIS — I4891 Unspecified atrial fibrillation: Secondary | ICD-10-CM | POA: Diagnosis not present

## 2024-06-25 LAB — COMPREHENSIVE METABOLIC PANEL WITH GFR
ALT: 35 U/L (ref 0–44)
AST: 29 U/L (ref 15–41)
Albumin: 2.8 g/dL — ABNORMAL LOW (ref 3.5–5.0)
Alkaline Phosphatase: 79 U/L (ref 38–126)
Anion gap: 9 (ref 5–15)
BUN: 18 mg/dL (ref 8–23)
CO2: 31 mmol/L (ref 22–32)
Calcium: 9.2 mg/dL (ref 8.9–10.3)
Chloride: 98 mmol/L (ref 98–111)
Creatinine, Ser: 0.96 mg/dL (ref 0.44–1.00)
GFR, Estimated: 59 mL/min — ABNORMAL LOW (ref >60)
Glucose, Bld: 96 mg/dL (ref 70–99)
Potassium: 3.8 mmol/L (ref 3.5–5.1)
Sodium: 138 mmol/L (ref 135–145)
Total Bilirubin: 1.4 mg/dL — ABNORMAL HIGH (ref 0.0–1.2)
Total Protein: 7.1 g/dL (ref 6.5–8.1)

## 2024-06-25 LAB — CBC WITH DIFFERENTIAL/PLATELET
Abs Immature Granulocytes: 0.02 K/uL (ref 0.00–0.07)
Basophils Absolute: 0 K/uL (ref 0.0–0.1)
Basophils Relative: 1 %
Eosinophils Absolute: 0.2 K/uL (ref 0.0–0.5)
Eosinophils Relative: 4 %
HCT: 37.9 % (ref 36.0–46.0)
Hemoglobin: 13.2 g/dL (ref 12.0–15.0)
Immature Granulocytes: 1 %
Lymphocytes Relative: 22 %
Lymphs Abs: 1 K/uL (ref 0.7–4.0)
MCH: 34.9 pg — ABNORMAL HIGH (ref 26.0–34.0)
MCHC: 34.8 g/dL (ref 30.0–36.0)
MCV: 100.3 fL — ABNORMAL HIGH (ref 80.0–100.0)
Monocytes Absolute: 0.5 K/uL (ref 0.1–1.0)
Monocytes Relative: 11 %
Neutro Abs: 2.7 K/uL (ref 1.7–7.7)
Neutrophils Relative %: 61 %
Platelets: 223 K/uL (ref 150–400)
RBC: 3.78 MIL/uL — ABNORMAL LOW (ref 3.87–5.11)
RDW: 14.6 % (ref 11.5–15.5)
WBC: 4.3 K/uL (ref 4.0–10.5)
nRBC: 0 % (ref 0.0–0.2)

## 2024-06-25 LAB — MAGNESIUM: Magnesium: 2 mg/dL (ref 1.7–2.4)

## 2024-06-25 LAB — PHOSPHORUS: Phosphorus: 3.3 mg/dL (ref 2.5–4.6)

## 2024-06-25 LAB — BRAIN NATRIURETIC PEPTIDE: B Natriuretic Peptide: 426.1 pg/mL — ABNORMAL HIGH (ref 0.0–100.0)

## 2024-06-25 MED ORDER — METOPROLOL SUCCINATE ER 100 MG PO TB24: 100.0000 mg | ORAL_TABLET | Freq: Every day | ORAL | Status: DC

## 2024-06-25 MED ORDER — SPIRONOLACTONE 25 MG PO TABS: 12.5000 mg | ORAL_TABLET | Freq: Every day | ORAL | Status: DC

## 2024-06-25 MED ORDER — FUROSEMIDE 40 MG PO TABS
40.0000 mg | ORAL_TABLET | Freq: Every day | ORAL | Status: DC
  Administered 2024-06-25: 40 mg via ORAL
  Filled 2024-06-25: qty 1

## 2024-06-25 MED ORDER — QUETIAPINE FUMARATE 25 MG PO TABS: 12.5000 mg | ORAL_TABLET | Freq: Every day | ORAL | Status: DC

## 2024-06-25 MED ORDER — FUROSEMIDE 40 MG PO TABS: 40.0000 mg | ORAL_TABLET | Freq: Every day | ORAL | Status: DC

## 2024-06-25 NOTE — Progress Notes (Signed)
 [place] [facility] Liaison Note:  Notified by [facility] manager of patient/family request for [facility] Palliative services in facility after discharge.   Please call with any hospice or outpatient palliative care related questions.   Thank you for the opportunity to participate in this patient's care.   Elouise Husband, BSN, RN, OCN [employer] [phone]

## 2024-06-25 NOTE — TOC Transition Note (Signed)
 Transition of Care [facility]) - Discharge Note   Patient Details  Name: WANISHA SHIROMA MRN: [id number] Date of Birth: January 27, 1941  [facility]) CM/SW Contact:  Corean JAYSON Canary, RN Phone Number: 06/25/2024, 8:40 AM   Clinical Narrative:    Elouise Husband for [facility] notified of OP palliative consult   Final next level of care: Skilled Nursing Facility Barriers to Discharge: No Barriers Identified   Patient Goals and CMS Choice Patient states their goals for this hospitalization and ongoing recovery are:: To go to rehab          Discharge Placement                       Discharge Plan and Services Additional resources added to the After Visit Summary for   In-house Referral: Clinical Social Work                                   Social Drivers of Health (SDOH) Interventions SDOH Screenings   Food Insecurity: Patient Unable To Answer (06/17/2024)  Housing: Unknown (06/17/2024)  Transportation Needs: Patient Unable To Answer (06/17/2024)  Utilities: Patient Unable To Answer (06/17/2024)  Social Connections: Patient Unable To Answer (06/17/2024)  Tobacco Use: Low Risk  (06/16/2024)     Readmission Risk Interventions     No data to display

## 2024-06-25 NOTE — TOC Progression Note (Signed)
 Transition of Care [facility]) - Progression Note    Patient Details  Name: Phyllis Knight MRN: [id number] Date of Birth: Jan 14, 1941  Transition of Care [facility]) CM/SW Contact  Inocente GORMAN Kindle, LCSW Phone Number: 06/25/2024, 12:58 PM  Clinical Narrative:    CSW update patient's spouse and daughter. They are in agreement with [facility] for transportation. [facility] is ready for patient.    Expected Discharge Plan: Skilled Nursing Facility Barriers to Discharge: No Barriers Identified               Expected Discharge Plan and Services In-house Referral: Clinical Social Work     Living arrangements for the past 2 months: Single Family Home Expected Discharge Date: 06/25/24                                     Social Drivers of Health (SDOH) Interventions SDOH Screenings   Food Insecurity: Patient Unable To Answer (06/17/2024)  Housing: Unknown (06/17/2024)  Transportation Needs: Patient Unable To Answer (06/17/2024)  Utilities: Patient Unable To Answer (06/17/2024)  Social Connections: Patient Unable To Answer (06/17/2024)  Tobacco Use: Low Risk  (06/16/2024)    Readmission Risk Interventions     No data to display

## 2024-06-25 NOTE — Discharge Summary (Addendum)
 Physician Discharge Summary  Phyllis Knight FMW:[id number] DOB: July 29, 1941 DOA: 06/16/2024  PCP: Claudene Pellet, MD  Admit date: 06/16/2024 Discharge date: 06/25/2024  Time spent: 40 minutes  Recommendations for Outpatient Follow-up:  Follow outpatient CBC/CMP  Follow with cardiology outpatient for HF and atrial fibrillation Needs repeat labs within 1 week to ensure kidney function/lytes appropriate on increased lasix  dose Follow with Dr. Octavia outpatient for orbital floor fracture  Hyperdense nodule in cecum needs outpatient follow up - consider hemoccult vs colonoscopy based on GOC Palliative to follow outpatient  Discharge Diagnoses:  Principal Problem:   Shock [facility]) Active Problems:   Atrial fibrillation with RVR (HCC)   Congestive heart failure (HCC)   Acute combined systolic and diastolic heart failure (HCC)   Parainfluenza virus bronchopneumonia   Discharge Condition: stable  Diet recommendation: heart healthy  Filed Weights   06/24/24 0500 06/25/24 0500 06/25/24 1102  Weight: 76.7 kg 75.1 kg 75.1 kg    History of present illness:   83yo F with history of  Dementia, fibromyalgia, hx uterine cancer, afib on Eliquis , GERD, HLD . Presented with SOB, increased confusion, and dysuria x3 days. Was seen 7/5 for fall on Eliquis , found to have closed orbital fracture.    Reported decreased functional ability over the last year. Increased confusion and incontinence over the last week which is new for her per family.  In the ER she was diagnosed with acute hypoxic respiratory failure placed on BiPAP was diagnosed with CHF along with possible pneumonia admitted to ICU.  She was stabilized and transferred to [facility] 06/19/2024 on day 3 of her hospital stay.   She's improved with antibiotics and diuresis.  Cards followed to assist with management of volume overload and atrial fibrillation.  Stable for discharge today, see below for additional details.   Hospital Course:  Assessment  and Plan:  Shock Related to sepsis and cardiogenic shock Concern for UTI/parainfluenza infection UA at presentation not c/w UTI, negative urine culture Negative blood cultures Management as below   Heart Failure with Reduced EF with Exacerbation Echo 7/17 with EF 25-30%, global hypokinesis, moderately reduced RVSF, IVC dilated with <50% resp variability Suspected takotsubo vs tachy mediated - planning to follow echo outpatient, consider ischemic eval if not improved Appreciate cardiology assistance Lasix  40 mg at discharge Spironolactone  12.5 mg daily.  Metoprolol  100 mg daily.   Strict I/O, daily weights   Acute hypoxic respiratory failure Parainfluenza Virus Infection  Community Acquired Pneumonia With contribution from HF above as well CXR on day of discharge with cardiomegaly with improved pulm venous congestion Negative covid, flu, RSV Parainfluenza virus positive  Treated with abx for possible bacterial component    Paroxysmal atrial fibrillation   Eliquis , metoprolol  100 mg daily.   Appreciate cards assistance - plan for outpatient follow up   Dyslipidemia.  On statin continue.   Underlying dementia.   At risk for delirium/metabolic encephalopathy, continue Seroquel  bedtime and monitor, minimize narcotics and benzodiazepines Delirium precautions   Right Orbital Floor Fracture  Case discussed with Dr. Octavia on 7/5, plan for outpatient follow up    Hypokalemia.  Replaced.     GERD.  PPI.   Hyperdense nodule in cecum Needs outpatient follow up, consider hemoccoult, colonoscopy based on GOC  Goals of care Palliative following - plan for outpatient palliative     Procedures:  Echo IMPRESSIONS     1. Left ventricular ejection fraction, by estimation, is 25 to 30%. The  left ventricle has severely decreased function.  The left ventricle  demonstrates global hypokinesis. Left ventricular diastolic function could  not be evaluated.   2. Right ventricular  systolic function is moderately reduced. The right  ventricular size is moderately enlarged. There is mildly elevated  pulmonary artery systolic pressure. The estimated right ventricular  systolic pressure is 44.6 mmHg.   3. Left atrial size was mildly dilated.   4. Right atrial size was mildly dilated.   5. The mitral valve is normal in structure. Mild mitral valve  regurgitation. No evidence of mitral stenosis.   6. Tricuspid valve regurgitation is moderate.   7. The aortic valve is normal in structure. Aortic valve regurgitation is  mild. No aortic stenosis is present.   8. The inferior vena cava is dilated in size with <50% respiratory  variability, suggesting right atrial pressure of 15 mmHg.   Consultations: cardiology  Discharge Exam: Vitals:   06/25/24 1150 06/25/24 1152  BP:    Pulse: 81   Resp: (!) 21   Temp:  (!) 97.5 F (36.4 C)  SpO2: 91%    No complaints Husband at bedside - discussed plan for discharge  General: chronically ill appearing, but today she looks the best that I've seen her in the past 3 days - much more alert, good color to face Cardiovascular: irregularly irregular Lungs: Clear to auscultation bilaterally - few scattered rhonchi on anterior exam - improving Abdomen: Soft, nontender, nondistended  Neurological: Alert. Moves all extremities 4 with equal strength. Cranial nerves II through XII grossly intact. Skin: Warm and dry. No rashes or lesions. Extremities: improved edema  Discharge Instructions   Discharge Instructions     Call MD for:  difficulty breathing, headache or visual disturbances   Complete by: As directed    Call MD for:  extreme fatigue   Complete by: As directed    Call MD for:  hives   Complete by: As directed    Call MD for:  persistant dizziness or light-headedness   Complete by: As directed    Call MD for:  persistant nausea and vomiting   Complete by: As directed    Call MD for:  redness, tenderness, or signs of  infection (pain, swelling, redness, odor or green/yellow discharge around incision site)   Complete by: As directed    Call MD for:  severe uncontrolled pain   Complete by: As directed    Call MD for:  temperature >100.4   Complete by: As directed    Diet - low sodium heart healthy   Complete by: As directed    Discharge instructions   Complete by: As directed    You were seen for parainfluenza virus and heart failure.  You've been seen by cardiology.  We increased your lasix  to 40 mg daily and we started you on spironolactone .  Follow repeat labs within 1 week to ensure you kidney function and electrolytes are stable.  You'll need to follow outpatient with cardiology.  You'll need Torrin Crihfield repeat echo as an outpatient with cardiology to see if your heart is still weak.  They may consider additional testing outpatient.  You had atrial fibrillation.  We've started you on metoprolol .  You'll continue eliquis .  Follow outpatient with cardiology for additional management.  You have an orbital floor fracture that needs outpatient ophthalmology follow up.  There was Clyde Upshaw nodule in the cecum that was seen on imaging.  This warrants outpatient follow up and Afrah Burlison discussion with your PCP on how to follow up (colonoscopy vs  other).    Return for new, recurrent, or worsening symptoms.  Please ask your PCP to request records from this hospitalization so they know what was done and what the next steps will be.   Increase activity slowly   Complete by: As directed       Allergies as of 06/25/2024       Reactions   Duloxetine Hcl Other (See Comments)   jumping legs   Cefuroxime Rash   Codeine Nausea And Vomiting   Zofran  [ondansetron  Hcl] Rash        Medication List     STOP taking these medications    amLODipine  10 MG tablet Commonly known as: NORVASC    diltiazem  30 MG tablet Commonly known as: CARDIZEM    traMADol  50 MG tablet Commonly known as: ULTRAM        TAKE these medications     acetaminophen  325 MG tablet Commonly known as: TYLENOL  Take 650 mg by mouth every 6 (six) hours as needed for mild pain.   cetirizine 10 MG tablet Commonly known as: ZYRTEC Take 10 mg by mouth daily as needed for rhinitis.   cholecalciferol  1000 units tablet Commonly known as: VITAMIN D  Take 2,000 Units by mouth daily.   Eliquis  5 MG Tabs tablet Generic drug: apixaban  TAKE 1 TABLET BY MOUTH TWICE  DAILY   furosemide  40 MG tablet Commonly known as: LASIX  Take 1 tablet (40 mg total) by mouth daily. Start taking on: June 26, 2024 What changed:  medication strength how much to take   metoprolol  succinate 100 MG 24 hr tablet Commonly known as: TOPROL -XL Take 1 tablet (100 mg total) by mouth daily. Take with or immediately following Viaan Knippenberg meal. Start taking on: June 26, 2024 What changed:  medication strength See the new instructions.   pantoprazole  40 MG tablet Commonly known as: PROTONIX  Take 1 tablet twice daily for 1 week and then 1 tablet once daily What changed:  how much to take how to take this when to take this reasons to take this additional instructions   potassium chloride  8 MEQ tablet Commonly known as: KLOR-CON  Take 8 mEq by mouth daily.   QUEtiapine  25 MG tablet Commonly known as: SEROQUEL  Take 0.5 tablets (12.5 mg total) by mouth at bedtime. What changed:  medication strength how much to take   rosuvastatin  20 MG tablet Commonly known as: CRESTOR  TAKE 1 TABLET BY MOUTH DAILY   senna-docusate 8.6-50 MG tablet Commonly known as: Senokot-S Take 1 tablet by mouth 2 (two) times daily. What changed:  when to take this reasons to take this   spironolactone  25 MG tablet Commonly known as: ALDACTONE  Take 0.5 tablets (12.5 mg total) by mouth daily. Start taking on: June 26, 2024       Allergies  Allergen Reactions   Duloxetine Hcl Other (See Comments)    jumping legs   Cefuroxime Rash   Codeine Nausea And Vomiting   Zofran  [Ondansetron   Hcl] Rash    Contact information for after-discharge care     Destination     [facility] .   Service: Skilled Nursing Contact information: [address] [place]  [phone]                      The results of significant diagnostics from this hospitalization (including imaging, microbiology, ancillary and laboratory) are listed below for reference.    Significant Diagnostic Studies: DG CHEST PORT 1 VIEW Result Date: 06/25/2024 CLINICAL DATA:  Shortness of  breath EXAM: PORTABLE CHEST 1 VIEW COMPARISON:  06/22/2024 FINDINGS: Remote right rib fractures Midline trachea. Mild cardiomegaly. Atherosclerosis in the transverse aorta. No pleural effusion or pneumothorax. Pulmonary venous congestion is improved. Right infrahilar subsegmental atelectasis or scarring. No lobar consolidation. Skin fold over the upper right hemithorax IMPRESSION: Cardiomegaly with improved pulmonary venous congestion. Electronically Signed   By: Rockey Kilts M.D.   On: 06/25/2024 08:14   DG Chest Port 1 View Result Date: 06/22/2024 CLINICAL DATA:  Shortness of breath and hypertension. EXAM: PORTABLE CHEST 1 VIEW COMPARISON:  06/19/2024 FINDINGS: The cardio pericardial silhouette is enlarged. There is pulmonary vascular congestion. Interstitial pulmonary edema not excluded. Streaky density in the lower lungs bilaterally suggest atelectasis. No substantial pleural effusion. Telemetry leads overlie the chest. IMPRESSION: Enlargement of the cardiopericardial silhouette with pulmonary vascular congestion. Interstitial pulmonary edema not excluded. Electronically Signed   By: Camellia Candle M.D.   On: 06/22/2024 07:01   DG Chest Port 1 View Result Date: 06/19/2024 CLINICAL DATA:  Shortness of breath EXAM: PORTABLE CHEST 1 VIEW COMPARISON:  06/16/2024 FINDINGS: Unchanged cardiac enlargement prominent pulmonary arteries suggest PA hypertension. Aortic atherosclerosis. Pulmonary vascular  congestion with mildly increased lower lobe interstitial markings. Similar appearance of left midlung and bibasilar atelectasis. IMPRESSION: 1. Cardiomegaly and pulmonary vascular congestion. Mild lower lung zone interstitial edema. 2. Left midlung and bibasilar atelectasis. Electronically Signed   By: Waddell Calk M.D.   On: 06/19/2024 07:19   ECHOCARDIOGRAM COMPLETE Result Date: 06/17/2024    ECHOCARDIOGRAM REPORT   Patient Name:   Phyllis Knight Date of Exam: 06/17/2024 Medical Rec #:  [id number]        Height:       69.0 in Accession #:    [id number]       Weight:       188.5 lb Date of Birth:  07/29/1941        BSA:          2.015 m Patient Age:    82 years         BP:           110/84 mmHg Patient Gender: F                HR:           104 bpm. Exam Location:  Inpatient Procedure: 2D Echo, Cardiac Doppler, Color Doppler and Intracardiac            Opacification Agent (Both Spectral and Color Flow Doppler were            utilized during procedure). Indications:    Atrial Fibrillation  History:        Patient has prior history of Echocardiogram examinations.                 Arrythmias:Atrial Fibrillation and PVC; Risk                 Factors:Hypertension.  Sonographer:    Christiana Mbomeh Referring Phys: [id number] TORIBIO JAYSON SHARPS IMPRESSIONS  1. Left ventricular ejection fraction, by estimation, is 25 to 30%. The left ventricle has severely decreased function. The left ventricle demonstrates global hypokinesis. Left ventricular diastolic function could not be evaluated.  2. Right ventricular systolic function is moderately reduced. The right ventricular size is moderately enlarged. There is mildly elevated pulmonary artery systolic pressure. The estimated right ventricular systolic pressure is 44.6 mmHg.  3. Left atrial size was mildly dilated.  4. Right atrial size was mildly  dilated.  5. The mitral valve is normal in structure. Mild mitral valve regurgitation. No evidence of mitral stenosis.  6. Tricuspid  valve regurgitation is moderate.  7. The aortic valve is normal in structure. Aortic valve regurgitation is mild. No aortic stenosis is present.  8. The inferior vena cava is dilated in size with <50% respiratory variability, suggesting right atrial pressure of 15 mmHg. FINDINGS  Left Ventricle: Left ventricular ejection fraction, by estimation, is 25 to 30%. The left ventricle has severely decreased function. The left ventricle demonstrates global hypokinesis. Definity  contrast agent was given IV to delineate the left ventricular endocardial borders. The left ventricular internal cavity size was normal in size. There is no left ventricular hypertrophy. Left ventricular diastolic function could not be evaluated due to atrial fibrillation. Left ventricular diastolic function could not be evaluated. Right Ventricle: The right ventricular size is moderately enlarged. No increase in right ventricular wall thickness. Right ventricular systolic function is moderately reduced. There is mildly elevated pulmonary artery systolic pressure. The tricuspid regurgitant velocity is 2.72 m/s, and with an assumed right atrial pressure of 15 mmHg, the estimated right ventricular systolic pressure is 44.6 mmHg. Left Atrium: Left atrial size was mildly dilated. Right Atrium: Right atrial size was mildly dilated. Pericardium: There is no evidence of pericardial effusion. Mitral Valve: The mitral valve is normal in structure. Mild mitral valve regurgitation. No evidence of mitral valve stenosis. Tricuspid Valve: The tricuspid valve is normal in structure. Tricuspid valve regurgitation is moderate . No evidence of tricuspid stenosis. Aortic Valve: The aortic valve is normal in structure. Aortic valve regurgitation is mild. No aortic stenosis is present. Aortic valve mean gradient measures 3.7 mmHg. Aortic valve peak gradient measures 6.7 mmHg. Aortic valve area, by VTI measures 1.49 cm. Pulmonic Valve: The pulmonic valve was normal in  structure. Pulmonic valve regurgitation is not visualized. No evidence of pulmonic stenosis. Aorta: The aortic root is normal in size and structure. Venous: The inferior vena cava is dilated in size with less than 50% respiratory variability, suggesting right atrial pressure of 15 mmHg. IAS/Shunts: No atrial level shunt detected by color flow Doppler.  LEFT VENTRICLE PLAX 2D LVIDd:         5.10 cm LVIDs:         4.30 cm LV PW:         0.90 cm LV IVS:        1.10 cm LVOT diam:     1.80 cm LV SV:         28 LV SV Index:   14 LVOT Area:     2.54 cm  LV Volumes (MOD) LV vol d, MOD A2C: 119.0 ml LV vol d, MOD A4C: 113.0 ml LV vol s, MOD A2C: 70.8 ml LV vol s, MOD A4C: 84.5 ml LV SV MOD A2C:     48.2 ml LV SV MOD A4C:     113.0 ml LV SV MOD BP:      39.8 ml RIGHT VENTRICLE TAPSE (M-mode): 1.4 cm LEFT ATRIUM             Index        RIGHT ATRIUM           Index LA diam:        3.80 cm 1.89 cm/m   RA Area:     20.60 cm LA Vol (A2C):   68.1 ml 33.80 ml/m  RA Volume:   64.70 ml  32.12 ml/m LA Vol (A4C):  67.5 ml 33.51 ml/m LA Biplane Vol: 69.8 ml 34.65 ml/m  AORTIC VALVE AV Area (Vmax):    1.59 cm AV Area (Vmean):   1.57 cm AV Area (VTI):     1.49 cm AV Vmax:           129.67 cm/s AV Vmean:          90.367 cm/s AV VTI:            0.188 m AV Peak Grad:      6.7 mmHg AV Mean Grad:      3.7 mmHg LVOT Vmax:         80.77 cm/s LVOT Vmean:        55.733 cm/s LVOT VTI:          0.110 m LVOT/AV VTI ratio: 0.58  AORTA Ao Root diam: 2.80 cm Ao Asc diam:  3.20 cm TRICUSPID VALVE TR Peak grad:   29.6 mmHg TR Vmax:        272.00 cm/s  SHUNTS Systemic VTI:  0.11 m Systemic Diam: 1.80 cm Morene Brownie Electronically signed by Morene Brownie Signature Date/Time: 06/17/2024/5:17:56 PM    Final    DG Chest Port 1 View Result Date: 06/16/2024 EXAM: 1 VIEW XRAY OF THE CHEST 06/16/2024 08:02:00 PM COMPARISON: None available. CLINICAL HISTORY: Encounter for central line placement. FINDINGS: LUNGS AND PLEURA: Pulmonary vascular  congestion. Similar findings of pulmonary edema compared to same day chest CT. Small bilateral pleural effusions. No pneumothorax. HEART AND MEDIASTINUM: Stable cardiomegaly. Aortic atherosclerotic calcification. BONES AND SOFT TISSUES: No acute osseous abnormality. LINES AND TUBES: Right IJ central venous catheter with tip in the low SVC. IMPRESSION: 1. Pulmonary vascular congestion and pulmonary edema, similar to same day chest CT. 2. Right IJ CVC tip in the low SVC. 3. No pneumothorax. Electronically signed by: Norman Gatlin MD 06/16/2024 08:20 PM EDT RP Workstation: [id number]   US  EKG SITE RITE Result Date: 06/16/2024 If Site Rite image not attached, placement could not be confirmed due to current cardiac rhythm.  CT Angio Chest/Abd/Pel for Dissection W and/or Wo Contrast Result Date: 06/16/2024 CLINICAL DATA:  Acute aortic syndrome (AAS) suspected, dyspnea worsening throughout the day, dysuria EXAM: CT ANGIOGRAPHY CHEST, ABDOMEN AND PELVIS TECHNIQUE: Non-contrast CT of the chest was initially obtained. Multidetector CT imaging through the chest, abdomen and pelvis was performed using the standard protocol during bolus administration of intravenous contrast. Multiplanar reconstructed images and MIPs were obtained and reviewed to evaluate the vascular anatomy. RADIATION DOSE REDUCTION: This exam was performed according to the departmental dose-optimization program which includes automated exposure control, adjustment of the mA and/or kV according to patient size and/or use of iterative reconstruction technique. CONTRAST:  OMNIPAQUE  IOHEXOL  350 MG/ML SOLN COMPARISON:  February 12, 2023, May 04, 2016 FINDINGS: CTA CHEST FINDINGS Pulmonary Embolism: While the exam was not optimized for the evaluation of the pulmonary arteries, no central pulmonary embolism visualized. Cardiovascular: Mild cardiomegaly. Small volume pericardial effusion. Scattered multi-vessel coronary atherosclerosis. No aortic aneurysm,  intramural hematoma, or aortic dissection. Normal variant 2 vessel aortic arch anatomy with the left common carotid arising from the brachiocephalic artery. Scattered aortic atherosclerosis. Mediastinum/Nodes: No mediastinal mass. No mediastinal, hilar, or axillary lymphadenopathy. Lungs/Pleura: The midline trachea and bronchi are patent. Peribronchial prominence with interlobular septal thickening predominantly in both upper lobes. Patchy ground-glass nodular opacities within both upper lobes with more pronounced nodular opacities in the right lower lobe. Small right pleural effusion. Trace left pleural effusion. No pneumothorax. Musculoskeletal: No  acute fracture or destructive bone lesion. Moderate bilateral glenohumeral joint osteoarthritis. Review of the MIP images confirms the above findings. CTA ABDOMEN AND PELVIS FINDINGS VASCULAR Aorta: No aortic aneurysm or aortic dissection. Scattered aortoiliac atherosclerosis. no hemodynamically significant stenosis. Celiac: Patent without acute thrombus, aneurysm, or dissection. No hemodynamically significant stenosis. SMA: Patent without acute thrombus, aneurysm, or dissection. No hemodynamically significant stenosis. Renals: Patent without acute thrombus, aneurysm, or dissection. Chunky calcified plaque at the ostia of both renal arteries. No hemodynamically significant stenosis. IMA: Patent without acute thrombus, aneurysm, or dissection. No hemodynamically significant stenosis. Inflow: Patent without acute thrombus, aneurysm, or dissection. No hemodynamically significant stenosis. Proximal Outflow: The bilateral common femoral and visualized portions of the superficial and profunda femoral arteries are patent without acute thrombus, aneurysm, or dissection. No hemodynamically significant stenosis. Veins: Nondiagnostic evaluation of the veins due to arterial timing of the contrast bolus. Review of the MIP images confirms the above findings. NON-VASCULAR  Hepatobiliary: No mass.Cholecystectomy. No intrahepatic or extrahepatic biliary ductal dilation. Pancreas: No mass or main ductal dilation.No peripancreatic inflammation or fluid collection. Spleen: Normal size. No mass. Adrenals/Urinary Tract: No adrenal masses. No renal mass. No hydronephrosis or nephrolithiasis. Circumferential wall thickening of the urinary bladder. Stomach/Bowel: The stomach is decompressed without focal abnormality. No small bowel wall thickening or inflammation. No small bowel obstruction.Normal appendix. Small hyperdense nodule layering in the cecum measuring 1 cm (axial 205). Lymphatic: No intraabdominal or pelvic lymphadenopathy. Reproductive: Hysterectomy. No concerning adnexal mass. Presacral stranding without layering free fluid in the pelvis. Other: No pneumoperitoneum, ascites, or mesenteric inflammation. Musculoskeletal: No acute fracture or destructive lesion.Diffuse anasarca. Diffuse osteopenia. Multilevel degenerative disc disease of the spine. Thoracic DISH. bilateral hip osteoarthritis. Review of the MIP images confirms the above findings. IMPRESSION: 1. No aortic aneurysm, intramural hematoma, or aortic dissection. 2. Mild cardiomegaly with findings of pulmonary edema. Small right and trace left pleural effusions. 3. Circumferential wall thickening of the urinary bladder, which may be due to underdistension. If there is concern for acute cystitis, correlation with urinalysis would be recommended. 4. Small hyperdense nodule layering in the cecum, measuring 1 cm (axial 205). While this could represent Marna Weniger small region of inspissated stool, possibly within Saurav Crumble diverticulum, an enhancing polyp or neoplasm could have this appearance in the correct clinical context. Correlation with fecal Hemoccult recommended. Nonemergent colonoscopy could also be considered. 5. Presacral edema and diffuse anasarca, likely related to the patient's volume status. Aortic Atherosclerosis (ICD10-I70.0).  Electronically Signed   By: Rogelia Myers M.D.   On: 06/16/2024 16:10   CT Head Wo Contrast Result Date: 06/16/2024 CLINICAL DATA:  Mental status change, unknown cause Reevaluation status post fall on July 5 EXAM: CT HEAD WITHOUT CONTRAST TECHNIQUE: Contiguous axial images were obtained from the base of the skull through the vertex without intravenous contrast. RADIATION DOSE REDUCTION: This exam was performed according to the departmental dose-optimization program which includes automated exposure control, adjustment of the mA and/or kV according to patient size and/or use of iterative reconstruction technique. COMPARISON:  06/05/2024 FINDINGS: Brain: There is atrophy and chronic small vessel disease changes. No acute intracranial abnormality. Specifically, no hemorrhage, hydrocephalus, mass lesion, acute infarction, or significant intracranial injury. Vascular: No hyperdense vessel or unexpected calcification. Skull: No acute calvarial abnormality. Sinuses/Orbits: Again noted is the right orbital floor fracture and herniated orbital fat, unchanged since prior study. Other: None IMPRESSION: Atrophy, chronic microvascular disease. No acute intracranial abnormality. No significant change since prior study. Electronically Signed   By: Franky Crease  M.D.   On: 06/16/2024 15:57   DG Chest Port 1 View Result Date: 06/16/2024 CLINICAL DATA:  Shortness of breath EXAM: PORTABLE CHEST 1 VIEW COMPARISON:  Portable 06/05/2024 FINDINGS: Enlarged cardiopericardial silhouette. Calcified tortuous aorta. There is some widening of the mediastinum. This is unchanged. Prominent central vasculature. Diffuse interstitial changes. No pneumothorax, effusion or consolidation. Overlapping cardiac leads. Osteopenia degenerative changes. Fixation hardware along the lower cervical spine. IMPRESSION: No significant interval change. Electronically Signed   By: Ranell Bring M.D.   On: 06/16/2024 13:42   CT Maxillofacial Wo  Contrast Result Date: 06/05/2024 CLINICAL DATA:  Fall.  Right eye swelling. EXAM: CT MAXILLOFACIAL WITHOUT CONTRAST TECHNIQUE: Multidetector CT imaging of the maxillofacial structures was performed. Multiplanar CT image reconstructions were also generated. RADIATION DOSE REDUCTION: This exam was performed according to the departmental dose-optimization program which includes automated exposure control, adjustment of the mA and/or kV according to patient size and/or use of iterative reconstruction technique. COMPARISON:  None Available. FINDINGS: Osseous: No fracture or mandibular dislocation. No destructive process. Orbits: There is Kurstin Dimarzo depressed fracture through the right orbital floor. Fragment is depressed approximately 8 mm. Herniated orbital fat noted. No evidence of muscular entrapment. High-density noted in the retro bulbar fat posterior to the right globe compatible with hemorrhage. Globe is intact. No left orbital fracture. Sinuses: Layering blood in the right maxillary sinus. Soft tissues: Soft tissue swelling over the right face, orbit and forehead. Limited intracranial: See head CT report IMPRESSION: Depressed right orbital floor fracture with herniated orbital fat. No muscular entrapment. High density behind the right globe compatible with retro bulbar hemorrhage. Electronically Signed   By: Franky Crease M.D.   On: 06/05/2024 21:08   CT CERVICAL SPINE WO CONTRAST Result Date: 06/05/2024 CLINICAL DATA:  Polytrauma, blunt.  Fall. EXAM: CT CERVICAL SPINE WITHOUT CONTRAST TECHNIQUE: Multidetector CT imaging of the cervical spine was performed without intravenous contrast. Multiplanar CT image reconstructions were also generated. RADIATION DOSE REDUCTION: This exam was performed according to the departmental dose-optimization program which includes automated exposure control, adjustment of the mA and/or kV according to patient size and/or use of iterative reconstruction technique. COMPARISON:  None  Available. FINDINGS: Alignment: Normal Skull base and vertebrae: No acute fracture. No primary bone lesion or focal pathologic process. Soft tissues and spinal canal: No prevertebral fluid or swelling. No visible canal hematoma. Disc levels: Prior ACDF from C3-C6. Degenerative disc disease with disc space narrowing and spurring above and below the fusion levels. Moderate degenerative facet disease bilaterally. Upper chest: Right pleural effusion partially visualized. Other: None IMPRESSION: Degenerative and postoperative changes in the cervical spine. No acute bony abnormality. Right pleural effusion partially visualized in the right chest. Electronically Signed   By: Franky Crease M.D.   On: 06/05/2024 19:15   CT HEAD WO CONTRAST Result Date: 06/05/2024 CLINICAL DATA:  Fall.  Head trauma. EXAM: CT HEAD WITHOUT CONTRAST TECHNIQUE: Contiguous axial images were obtained from the base of the skull through the vertex without intravenous contrast. RADIATION DOSE REDUCTION: This exam was performed according to the departmental dose-optimization program which includes automated exposure control, adjustment of the mA and/or kV according to patient size and/or use of iterative reconstruction technique. COMPARISON:  05/09/2022 FINDINGS: Brain: There is atrophy and chronic small vessel disease changes. No acute intracranial abnormality. Specifically, no hemorrhage, hydrocephalus, mass lesion, acute infarction, or significant intracranial injury. Vascular: No hyperdense vessel or unexpected calcification. Skull: No acute calvarial abnormality. Sinuses/Orbits: Soft tissue swelling overlying the right orbit and  face. There is Jozie Wulf depressed right orbital floor fracture with herniation of orbital fat. No entrapment. Globes are intact. High-density noted posterior to the right globe compatible with retrobulbar hemorrhage. Layering blood within the right maxillary sinus. Other: Soft tissue swelling noted over the right forehead.  IMPRESSION: Atrophy, chronic microvascular disease. No acute intracranial abnormality. Depressed right orbital floor fracture with herniated orbital fat but no muscular entrapment. High-density noted posterior to the right globe compatible with retro bulbar hemorrhage. Electronically Signed   By: Franky Crease M.D.   On: 06/05/2024 19:13   DG Pelvis Portable Result Date: 06/05/2024 CLINICAL DATA:  Trauma EXAM: PORTABLE PELVIS 1-2 VIEWS COMPARISON:  None Available. FINDINGS: There is no evidence of pelvic fracture or diastasis. No pelvic bone lesions are seen. IMPRESSION: Negative. Electronically Signed   By: Franky Crease M.D.   On: 06/05/2024 18:14   DG Chest Port 1 View Result Date: 06/05/2024 CLINICAL DATA:  Trauma EXAM: PORTABLE CHEST 1 VIEW COMPARISON:  02/12/2023 FINDINGS: Cardiomegaly, vascular congestion. Interstitial prominence could reflect interstitial edema. Aortic atherosclerosis. No effusions or acute bony abnormality. Old healed right rib fractures. IMPRESSION: Cardiomegaly, vascular congestion and interstitial prominence which could reflect interstitial edema. Electronically Signed   By: Franky Crease M.D.   On: 06/05/2024 18:14    Microbiology: Recent Results (from the past 240 hours)  Urine Culture (for pregnant, neutropenic or urologic patients or patients with an indwelling urinary catheter)     Status: None   Collection Time: 06/16/24 11:48 AM   Specimen: Urine, Clean Catch  Result Value Ref Range Status   Specimen Description URINE, CLEAN CATCH  Final   Special Requests NONE  Final   Culture   Final    NO GROWTH Performed at [facility], [address], [place], [place] [address]    Report Status 06/18/2024 FINAL  Final  Blood Culture (routine x 2)     Status: None   Collection Time: 06/16/24 12:53 PM   Specimen: BLOOD  Result Value Ref Range Status   Specimen Description BLOOD LEFT ANTECUBITAL  Final   Special Requests   Final    BOTTLES DRAWN AEROBIC AND  ANAEROBIC Blood Culture adequate volume   Culture   Final    NO GROWTH 5 DAYS Performed at [facility], [address], [place], [place] [address]    Report Status 06/21/2024 FINAL  Final  Resp panel by RT-PCR (RSV, Flu Rambo Sarafian&B, Covid) Anterior Nasal Swab     Status: None   Collection Time: 06/16/24  1:05 PM   Specimen: Anterior Nasal Swab  Result Value Ref Range Status   SARS Coronavirus 2 by RT PCR NEGATIVE NEGATIVE Final   Influenza Miaisabella Bacorn by PCR NEGATIVE NEGATIVE Final   Influenza B by PCR NEGATIVE NEGATIVE Final    Comment: (NOTE) The Xpert Xpress SARS-CoV-2/FLU/RSV plus assay is intended as an aid in the diagnosis of influenza from Nasopharyngeal swab specimens and should not be used as Aliou Mealey sole basis for treatment. Nasal washings and aspirates are unacceptable for Xpert Xpress SARS-CoV-2/FLU/RSV testing.  Fact Sheet for Patients: BloggerCourse.com  Fact Sheet for Healthcare Providers: SeriousBroker.it  This test is not yet approved or cleared by the [place]  FDA and has been authorized for detection and/or diagnosis of SARS-CoV-2 by FDA under an Emergency Use Authorization (EUA). This EUA will remain in effect (meaning this test can be used) for the duration of the COVID-19 declaration under Section 564(b)(1) of the Act, 21 U.S.C. section  360bbb-3(b)(1), unless the authorization is terminated or revoked.     Resp Syncytial Virus by PCR NEGATIVE NEGATIVE Final    Comment: (NOTE) Fact Sheet for Patients: BloggerCourse.com  Fact Sheet for Healthcare Providers: SeriousBroker.it  This test is not yet approved or cleared by the [place]  FDA and has been authorized for detection and/or diagnosis of SARS-CoV-2 by FDA under an Emergency Use Authorization (EUA). This EUA will remain in effect (meaning this test can be used) for the duration of the COVID-19 declaration  under Section 564(b)(1) of the Act, 21 U.S.C. section 360bbb-3(b)(1), unless the authorization is terminated or revoked.  Performed at [facility], [address], [place], [place] [address]   Respiratory (~20 pathogens) panel by PCR     Status: Abnormal   Collection Time: 06/16/24  1:33 PM   Specimen: Nasopharyngeal Swab; Respiratory  Result Value Ref Range Status   Adenovirus NOT DETECTED NOT DETECTED Final   Coronavirus 229E NOT DETECTED NOT DETECTED Final    Comment: (NOTE) The Coronavirus on the Respiratory Panel, DOES NOT test for the novel  Coronavirus (2019 nCoV)    Coronavirus HKU1 NOT DETECTED NOT DETECTED Final   Coronavirus NL63 NOT DETECTED NOT DETECTED Final   Coronavirus OC43 NOT DETECTED NOT DETECTED Final   Metapneumovirus NOT DETECTED NOT DETECTED Final   Rhinovirus / Enterovirus NOT DETECTED NOT DETECTED Final   Influenza Rockelle Heuerman NOT DETECTED NOT DETECTED Final   Influenza B NOT DETECTED NOT DETECTED Final   Parainfluenza Virus 1 NOT DETECTED NOT DETECTED Final   Parainfluenza Virus 2 NOT DETECTED NOT DETECTED Final   Parainfluenza Virus 3 DETECTED (Naiya Corral) NOT DETECTED Final   Parainfluenza Virus 4 NOT DETECTED NOT DETECTED Final   Respiratory Syncytial Virus NOT DETECTED NOT DETECTED Final   Bordetella pertussis NOT DETECTED NOT DETECTED Final   Bordetella Parapertussis NOT DETECTED NOT DETECTED Final   Chlamydophila pneumoniae NOT DETECTED NOT DETECTED Final   Mycoplasma pneumoniae NOT DETECTED NOT DETECTED Final    Comment: Performed at [facility], [address], [place], [place] [address]  Blood Culture (routine x 2)     Status: None   Collection Time: 06/16/24  3:06 PM   Specimen: BLOOD RIGHT HAND  Result Value Ref Range Status   Specimen Description BLOOD RIGHT HAND  Final   Special Requests   Final    AEROBIC BOTTLE ONLY Blood Culture results may not be optimal due to an inadequate volume of blood received in culture bottles   Culture   Final     NO GROWTH 5 DAYS Performed at [facility], [address], [place], [place] [address]    Report Status 06/21/2024 FINAL  Final  MRSA Next Gen by PCR, Nasal     Status: None   Collection Time: 06/16/24  6:11 PM   Specimen: Nasal Mucosa; Nasal Swab  Result Value Ref Range Status   MRSA by PCR Next Gen NOT DETECTED NOT DETECTED Final    Comment: (NOTE) The GeneXpert MRSA Assay (FDA approved for NASAL specimens only), is one component of Janei Scheff comprehensive MRSA colonization surveillance program. It is not intended to diagnose MRSA infection nor to guide or monitor treatment for MRSA infections. Test performance is not FDA approved in patients less than 40 years old. Performed at [facility], [address], [place], [place] [address]      Labs: Basic Metabolic Panel: Recent Labs  Lab 06/19/24 [phone] 06/20/24 [id number] 06/21/24 9384  06/22/24 0542 06/23/24 0451 06/24/24 0445 06/25/24 0559  NA 139 140 140 138 136 137 138  K 3.5 3.0* 4.6 4.5 4.5 4.1 3.8  CL 101 103 99 99 96* 96* 98  CO2 24 25 31 29 28  33* 31  GLUCOSE 81 81 96 96 92 93 96  BUN 20 13 10 10 13 17 18   CREATININE 1.09* 0.82 0.78 0.82 0.71 0.90 0.96  CALCIUM  8.9 8.4* 9.0 9.1 9.4 9.1 9.2  MG 2.3 1.8 2.0 1.7 2.0 1.7 2.0  PHOS 2.9 3.8 3.1  --   --   --  3.3   Liver Function Tests: Recent Labs  Lab 06/21/24 0615 06/22/24 0542 06/23/24 0451 06/24/24 0445 06/25/24 0559  AST 75* 51* 46* 33 29  ALT 74* 59* 49* 39 35  ALKPHOS 67 70 69 70 79  BILITOT 1.3* 1.2 1.6* 1.2 1.4*  PROT 6.8 6.8 6.6 6.6 7.1  ALBUMIN  2.9* 2.8* 2.8* 2.6* 2.8*   No results for input(s): LIPASE, AMYLASE in the last 168 hours. No results for input(s): AMMONIA in the last 168 hours. CBC: Recent Labs  Lab 06/21/24 0615 06/22/24 0542 06/23/24 0451 06/24/24 0445 06/25/24 0559  WBC 5.0 4.8 4.8 4.5 4.3  NEUTROABS  --  2.8 2.7 2.8 2.7  HGB 11.6* 11.8* 12.4 12.5 13.2  HCT 36.5 35.3* 35.1* 38.2 37.9  MCV 98.9 101.1* 100.9* 99.2  100.3*  PLT 174 192 190 224 223   Cardiac Enzymes: No results for input(s): CKTOTAL, CKMB, CKMBINDEX, TROPONINI in the last 168 hours. BNP: BNP (last 3 results) Recent Labs    06/23/24 0451 06/24/24 0445 06/25/24 0559  BNP 639.5* 576.2* 426.1*    ProBNP (last 3 results) No results for input(s): PROBNP in the last 8760 hours.  CBG: Recent Labs  Lab 06/18/24 1750 06/18/24 1921 06/18/24 2305 06/20/24 0636 06/22/24 1228  GLUCAP 82 84 106* 72 123*       Signed:  Meliton Monte MD.  [facility] 06/25/2024, 12:18 PM

## 2024-06-25 NOTE — Progress Notes (Signed)
 Plan to get BM[facility] at [facility] in 1 week after DC.

## 2024-06-25 NOTE — TOC Transition Note (Signed)
 Transition of Care [facility]) - Discharge Note   Patient Details  Name: Phyllis Knight MRN: [id number] Date of Birth: 1941-04-26  [facility] of [facility]) CM/SW Contact:  Inocente GORMAN Kindle, LCSW Phone Number: 06/25/2024, 2:16 PM   Clinical Narrative:    Patient will DC to: [facility] Anticipated DC date: 06/25/24 Family notified: Spouse and daughter Transport by: [facility]   Per MD patient ready for DC to [facility]. RN to call report prior to discharge [phone] room 305 B). RN, patient, patient's family, and facility notified of DC. Discharge Summary and FL2 sent to facility. DC packet on chart including signed DNR. Ambulance transport requested for patient.   CSW will sign off for now as social work intervention is no longer needed. Please consult us  again if new needs arise.     Final next level of care: Skilled Nursing Facility Barriers to Discharge: No Barriers Identified   Patient Goals and CMS Choice Patient states their goals for this hospitalization and ongoing recovery are:: To go to rehab          Discharge Placement   Existing PASRR number confirmed : 06/25/24          Patient [facility] bed at[facility] Patient to be transferred to facility by: [facility] Name of family member notified: Daughter and spouse Patient and family notified of of transfer: 06/25/24  Discharge Plan and Services Additional resources added to the After Visit Summary for   In-house Referral: Clinical Social Work                                   Social Drivers of Health (SDOH) Interventions SDOH Screenings   Food Insecurity: Patient Unable To Answer (06/17/2024)  Housing: Unknown (06/17/2024)  Transportation Needs: Patient Unable To Answer (06/17/2024)  Utilities: Patient Unable To Answer (06/17/2024)  Social Connections: Patient Unable To Answer (06/17/2024)  Tobacco Use: Low Risk  (06/16/2024)     Readmission Risk Interventions     No data to display

## 2024-06-25 NOTE — Progress Notes (Addendum)
 Patient seen and examined, note reviewed with the signed Advanced Practice Provider. I personally reviewed laboratory data, imaging studies and relevant notes. I independently examined the patient and formulated the important aspects of the plan. I have personally discussed the plan with the patient and/or family. Comments or changes to the note/plan are indicated below.  No acute events, breathing well on room air. JVD not visible at 30 degrees, heart is rate controlled with irregularly irregular rhythm, lungs still coarse but much improved, LE warm without edema.  Acute systolic and diastolic heart failure:  -euvolemic today. Change to oral lasix  40 mg daily today -tolerating metoprolol  at current dose. No blood pressure room to add ARB. Continue spironolactone  -EF newly reduced, suspect 2/2 takotsubo vs. Tachycardia mediated. Recheck as outpatient, if not improved, could consider outpatient ischemic evaluation -no SGLT2i given UTI   Atrial fibrillation -rates much improved. On oral metoprolol  alone.  -continue apixaban . Off for several days earlier this month for eye issue; could consider outpatient cardioversion once she has had uninterrupted DOAC for at least 3 weeks. More likely to hold sinus rhythm once pneumonia resolved.  We will sign off. We will arrange for outpatient cardiology follow up.  Shelda Bruckner, MD, PhD, [facility]  [facility]  [place]  Heart & Vascular at [facility] [address] [place], [place] [address] [phone]          Progress Note  Patient Name: AINARA ELDRIDGE Date of Encounter: 06/25/2024 [facility] Cardiologist: Newman [name], MD   Interval Summary   Has continued to feel better than it was previously.  Has been able to get up and ambulate.  Continues to have a productive cough but feels like the cough is improving.  Denies any chest pain or any worsening  shortness of breath.   Vital Signs Vitals:   06/25/24 0200 06/25/24 0400 06/25/24 0500 06/25/24 0800  BP:  105/68  113/85  Pulse: 97 82  81  Resp: 20 16  (!) 21  Temp:      TempSrc:      SpO2: 91% 91%  94%  Weight:   75.1 kg     Intake/Output Summary (Last 24 hours) at 06/25/2024 0832 Last data filed at 06/24/2024 1533 Gross per 24 hour  Intake --  Output 1200 ml  Net -1200 ml      06/25/2024    5:00 AM 06/24/2024    5:00 AM 06/23/2024    3:30 AM  Last 3 Weights  Weight (lbs) 165 lb 9.1 oz 169 lb 1.5 oz 171 lb 1.2 oz  Weight (kg) 75.1 kg 76.7 kg 77.6 kg      Telemetry/ECG  Atrial fibrillation with heart rates in the 70s to 80s- Personally Reviewed  Physical Exam  GEN: No acute distress.   Neck: No JVD Cardiac: RRR, no murmurs, rubs, or gallops.  Respiratory: Wheezing heard throughout the lungs.  No crackles.  Was able to lay flat on the bed without worsening shortness of breath. GI: Soft, nontender, non-distended  MS: No edema  Assessment & Plan  KENNY STERN is a 83 y.o. 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 Theophilus    Acute systolic and diastolic heart failure UTI Parainfluenza pneumonia/bronchitis PTA was on 20 mg oral lasix  for lower extremity edema. LVEF this admission newly reduced  at 25-30% from 60-65% in 2020.  The reduction in the LVEF was felt to be secondary to the patients acute illness.  Plan to recheck echo in about 3 months to reassess LVEF.  Outpatient ischemic evaluation may be considered at that time.  Parainfluenza infection and UTI are managed by primary team.  Has received multiple doses of IV Lasix .  I/O do not appear to be accurate as minimal intake was documented. Weight is down more than 20 pounds since admission.  Was able to lay flat on bed without worsening shortness of breath.  Chest x-ray today  showed cardiomegaly with improved pulmonary venous congestion.  Stop IV Lasix  Start 40 mg oral Lasix  daily.  Take an additional 40 mg oral Lasix  for a 5 to 10 pound weight gain, worsening shortness of breath, worsening lower extremity edema. Will need BMET in 1 week. Will need outpatient cardiology follow up. GDMT Continue spironolactone  12.5 mg daily. Avoid SGLT2 because of prior UTI's. Continue Metoprolol  succinate to 100mg  daily.     Atrial fibrillation with RVR HTN CHA2DS2-VASc Score = 5 [CHF History: 1, HTN History: 0, Diabetes History: 0, Stroke History: 0, Vascular Disease History: 1, Age Score: 2, Gender Score: 1].  Therefore, the patient's annual risk of stroke is 7.2 %.  Heart rate appears to be well-controlled in the 70s to 80's.  Most recent blood pressure was 113/85. K 4.1, Mg 1.7. Potassium at goal of greater than 4 will replace magnesium .  Plan for DCCV after has been on eliquis  for 3 weeks and parainfluenza infection has resolved. Continue Eliquis  5 mg twice daily. (Correct dose for weight and creatinine) Continue Metoprolol  succinate 100mg  daily.      Hyperlipidemia Transaminates resolved Continue home Crestor  20mg  daily.     Otherwise managed per primary Acute hypoxic respiratory failure, resolved Mixed septic/cardiogenic shock, resolved Acute kidney injury, resolved Transaminates, resolved - has down trended and is no longer elevated. Recent fall with closed orbital fracture Demetia Goals of care - being seen by palliative team.    [facility] will sign off.   The patient is ready for discharge today from a cardiac standpoint. Medication Recommendations:  As above Other recommendations (labs, testing, etc):  Get BMET checked in 1 week Follow up as an outpatient:  will schedule cardiology follow up For questions or updates, please contact [facility] Please consult [email or web address] for contact info under       Signed, Zane Adams, PA-C

## 2024-06-28 NOTE — Progress Notes (Signed)
 06/28/24 11:46 AM CSW received call from patient's daughter stating the SNF was not good and they took patient home. She asked about next steps. CSW referred her to her PCP for home health orders as the hospital is unable to follow up from home.   Deysy Schabel LCSW, MSW, [facility]

## 2024-07-09 ENCOUNTER — Ambulatory Visit (HOSPITAL_COMMUNITY): Attending: Physician Assistant | Admitting: Physician Assistant

## 2024-07-09 ENCOUNTER — Encounter (HOSPITAL_COMMUNITY): Payer: Self-pay

## 2024-07-09 NOTE — Progress Notes (Incomplete)
 Primary Care Physician: Claudene Pellet, MD Primary Cardiologist: Newman [name], MD Electrophysiologist: None  Referring Physician: Dr Lonni Eloisa LITTIE Phyllis is a 83 y.o. female with a history of HTN, dementia, HLD, CHF, atrial fibrillation who presents for follow up in the [facility].  She was seen in the ED on 06/05/2024. She had fallen and hit her eye on the wall.  Imaging showed a depressed orbital fracture with some fat entrapment and retro-bulbar hemorrhage.  Case was discussed with Dr. Octavia with ophthalmology.  Recommended symptomatic treatment for pain with outpatient follow-up in his office for monitoring.  She was instructed to hold the next 2 doses of her Eliquis .   Patient presented to the ED on 7/16 complaining of shortness of breath. Initial EKG in the ED showed atrial fibrillation with heart rate 149 bpm. Workup in the ED revealed UA concerning for UTI. BNP elevated to 3064. She was admitted to the critical care service for mixed septic and cardiogenic shock with lactic acidosis. Started on IV amiodarone  for atrial fibrillation. Also started on antibiotics. Echocardiogram 7/17 showed EF 25-30%. 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 Patient is on Eliquis  for stroke prevention.    Patient presents today for follow up for atrial fibrillation. ***  Today, she denies symptoms of ***palpitations, chest pain, shortness of breath, orthopnea, PND, lower extremity edema, dizziness, presyncope, syncope, snoring, daytime somnolence, bleeding, or neurologic sequela. The patient is tolerating medications without difficulties and is otherwise without complaint today.    Atrial Fibrillation Risk Factors:  she does not have symptoms or diagnosis of sleep apnea. she does not have a history of rheumatic fever.   Atrial Fibrillation Management history:  Previous antiarrhythmic drugs: none Previous cardioversions: none Previous ablations:  none Anticoagulation history: Eliquis   ROS- All systems are reviewed and negative except as per the HPI above.  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   GERD (gastroesophageal reflux disease)    H/O hiatal hernia    Hearing loss    left ear    History of uterine cancer 1975   s/p hysterectomy   Hyperlipidemia    Hypertension    Numbness and tingling    hands and feet bilat    Pneumonia yrs ago   Shortness of breath dyspnea    Syncope 07/16/2018   Tinnitus    Urge urinary incontinence    Urinary incontinence    Vitamin D  deficiency     Current Outpatient Medications  Medication Sig Dispense Refill   acetaminophen  (TYLENOL ) 325 MG tablet Take 650 mg by mouth every 6 (six) hours as needed for mild pain.     cetirizine (ZYRTEC) 10 MG tablet Take 10 mg by mouth daily as needed for rhinitis.     cholecalciferol  (VITAMIN D ) 1000 units tablet Take 2,000 Units by mouth daily.      ELIQUIS  5 MG TABS tablet TAKE 1 TABLET BY MOUTH TWICE  DAILY 180 tablet 3   furosemide  (LASIX ) 40 MG tablet Take 1 tablet (40 mg total) by mouth daily.  metoprolol  succinate (TOPROL -XL) 100 MG 24 hr tablet Take 1 tablet (100 mg total) by mouth daily. Take with or immediately following a meal.     pantoprazole  (PROTONIX ) 40 MG tablet Take 1 tablet twice daily for 1 week and then 1 tablet once daily (Patient taking differently: Take 40 mg by mouth daily as needed (for heartburn).) 60 tablet 0   potassium chloride  (KLOR-CON ) 8 MEQ tablet Take 8 mEq by mouth daily.      QUEtiapine  (SEROQUEL ) 25 MG tablet Take 0.5 tablets (12.5 mg total) by mouth at bedtime.     rosuvastatin  (CRESTOR ) 20 MG tablet TAKE 1 TABLET BY MOUTH DAILY 100 tablet 2    senna-docusate (SENOKOT-S) 8.6-50 MG tablet Take 1 tablet by mouth 2 (two) times daily. (Patient taking differently: Take 1 tablet by mouth daily as needed for mild constipation.) 60 tablet 0   spironolactone  (ALDACTONE ) 25 MG tablet Take 0.5 tablets (12.5 mg total) by mouth daily.     No current facility-administered medications for this visit.    Physical Exam: There were no vitals taken for this visit.  GEN: Well nourished, well developed in no acute distress NECK: No JVD; No carotid bruits CARDIAC: {EPRHYTHM:28826}, no murmurs, rubs, gallops RESPIRATORY:  Clear to auscultation without rales, wheezing or rhonchi  ABDOMEN: Soft, non-tender, non-distended EXTREMITIES:  No edema; No deformity   Wt Readings from Last 3 Encounters:  06/25/24 75.1 kg  06/05/24 72.6 kg  02/12/23 92.5 kg     EKG today demonstrates  ***  Echo 06/17/24 demonstrated   1. Left ventricular ejection fraction, by estimation, is 25 to 30%. The  left ventricle has severely decreased function. The left ventricle  demonstrates global hypokinesis. Left ventricular diastolic function could  not be evaluated.   2. Right ventricular systolic function is moderately reduced. The right  ventricular size is moderately enlarged. There is mildly elevated  pulmonary artery systolic pressure. The estimated right ventricular  systolic pressure is 44.6 mmHg.   3. Left atrial size was mildly dilated.   4. Right atrial size was mildly dilated.   5. The mitral valve is normal in structure. Mild mitral valve  regurgitation. No evidence of mitral stenosis.   6. Tricuspid valve regurgitation is moderate.   7. The aortic valve is normal in structure. Aortic valve regurgitation is  mild. No aortic stenosis is present.   8. The inferior vena cava is dilated in size with <50% respiratory  variability, suggesting right atrial pressure of 15 mmHg.    CHA2DS2-VASc Score = 5  The patient's score is based upon: CHF History:  1 HTN History: 0 Diabetes History: 0 Stroke History: 0 Vascular Disease History: 1 Age Score: 2 Gender Score: 1   {Confirm score is correct.  If not, click here to update score.  REFRESH note.  :1}    ASSESSMENT AND PLAN: Persistent Atrial Fibrillation (ICD10:  I48.19) The patient's CHA2DS2-VASc score is 5, indicating a 7.2% annual risk of stroke.   ***dccv  Secondary Hypercoagulable State (ICD10:  D68.69){Click to add to Prob List or Visit Dx  :[id number]} The patient is at significant risk for stroke/thromboembolism based upon her CHA2DS2-VASc Score of 5.  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  GDMT per primary cardiology team Fluid status appears stable today ***    Follow up ***  {Are you ordering a CV Procedure (e.g. stress test, cath,  DCCV, TEE, etc)?   Press F2        :[id number]}    [facility] [address] [place], [place] [address] [phone]

## 2024-07-14 ENCOUNTER — Ambulatory Visit: Attending: Cardiology | Admitting: Cardiology

## 2024-07-14 NOTE — Progress Notes (Deleted)
  Cardiology Office Note:  .   Date:  07/14/2024  ID:  Phyllis Knight, DOB 11-25-1941, MRN [id number] PCP: Claudene Pellet, MD  [facility] Providers Cardiologist:  Newman Lawrence, MD PCP: Claudene Pellet, MD  No chief complaint on file.    Phyllis Knight is a 83 y.o. female with hypertension, paroxysmal Afib, HFrEF, dementia  Discussed the use of AI scribe software for clinical note transcription with the patient, who gave verbal consent to proceed.  History of Present Illness  Patient was recently hospitalized in 06/2024 after an unwitnessed fall at home that led to injury to her eye.  Imaging showed depressed orbital fracture with some fat entrapment and retrobulbar hemorrhage.  Ophthalmology recommended symptomatic treatment for pain.  Eliquis  was held for 2 days.  Patient presented back to emergency room few days later with shortness of breath and painful urination.  She was found to have UTI with lactic acid as high as 7.8 that subsequently normalized.  High sensitive troponin was mildly elevated, along with significantly elevated BNP.  Chest x-ray showed diffuse interstitial changes.  Echocardiogram showed new finding of reduced EF at 25-30%.***     There were no vitals filed for this visit.    ROS      Studies Reviewed: .        ***  Echocardiogram 06/2024:  1. Left ventricular ejection fraction, by estimation, is 25 to 30%. The  left ventricle has severely decreased function. The left ventricle  demonstrates global hypokinesis. Left ventricular diastolic function could  not be evaluated.   2. Right ventricular systolic function is moderately reduced. The right  ventricular size is moderately enlarged. There is mildly elevated  pulmonary artery systolic pressure. The estimated right ventricular  systolic pressure is 44.6 mmHg.   3. Left atrial size was mildly dilated.   4. Right atrial size was mildly dilated.   5. The mitral valve is normal in  structure. Mild mitral valve  regurgitation. No evidence of mitral stenosis.   6. Tricuspid valve regurgitation is moderate.   7. The aortic valve is normal in structure. Aortic valve regurgitation is  mild. No aortic stenosis is present.   8. The inferior vena cava is dilated in size with <50% respiratory  variability, suggesting right atrial pressure of 15 mmHg.    Labs ***/202***: Chol ***, TG ***, HDL ***, LDL *** HbA1C ***% Hb *** Cr *** ***  Risk Assessment/Calculations:   {Does this patient have ATRIAL FIBRILLATION?[phone]}    Physical Exam   VISIT DIAGNOSES: No diagnosis found.   Phyllis Knight is a 83 y.o. female with *** Assessment and Plan Assessment & Plan       {Are you ordering a CV Procedure (e.g. stress test, cath, DCCV, TEE, etc)?   Press F2        :[id number]}    No orders of the defined types were placed in this encounter.    F/u in ***  Signed, Newman [name], MD

## 2024-08-23 ENCOUNTER — Inpatient Hospital Stay (HOSPITAL_COMMUNITY)
Admission: EM | Admit: 2024-08-23 | Discharge: 2024-08-27 | DRG: 291 | Disposition: A | Discharge status: Home / Self Care | Attending: Internal Medicine | Admitting: Internal Medicine

## 2024-08-23 ENCOUNTER — Other Ambulatory Visit: Payer: Self-pay

## 2024-08-23 DIAGNOSIS — Z823 Family history of stroke: Secondary | ICD-10-CM

## 2024-08-23 DIAGNOSIS — Z981 Arthrodesis status: Secondary | ICD-10-CM

## 2024-08-23 DIAGNOSIS — I5082 Biventricular heart failure: Secondary | ICD-10-CM | POA: Diagnosis present

## 2024-08-23 DIAGNOSIS — I50811 Acute right heart failure: Secondary | ICD-10-CM | POA: Diagnosis not present

## 2024-08-23 DIAGNOSIS — H9192 Unspecified hearing loss, left ear: Secondary | ICD-10-CM | POA: Diagnosis present

## 2024-08-23 DIAGNOSIS — M797 Fibromyalgia: Secondary | ICD-10-CM | POA: Diagnosis present

## 2024-08-23 DIAGNOSIS — Z79899 Other long term (current) drug therapy: Secondary | ICD-10-CM

## 2024-08-23 DIAGNOSIS — Z23 Encounter for immunization: Secondary | ICD-10-CM

## 2024-08-23 DIAGNOSIS — Z66 Do not resuscitate: Secondary | ICD-10-CM | POA: Diagnosis present

## 2024-08-23 DIAGNOSIS — I5043 Acute on chronic combined systolic (congestive) and diastolic (congestive) heart failure: Secondary | ICD-10-CM | POA: Diagnosis present

## 2024-08-23 DIAGNOSIS — E782 Mixed hyperlipidemia: Secondary | ICD-10-CM | POA: Diagnosis present

## 2024-08-23 DIAGNOSIS — Z96652 Presence of left artificial knee joint: Secondary | ICD-10-CM | POA: Diagnosis present

## 2024-08-23 DIAGNOSIS — I480 Paroxysmal atrial fibrillation: Secondary | ICD-10-CM | POA: Diagnosis present

## 2024-08-23 DIAGNOSIS — I110 Hypertensive heart disease with heart failure: Principal | ICD-10-CM | POA: Diagnosis present

## 2024-08-23 DIAGNOSIS — F0390 Unspecified dementia without behavioral disturbance: Secondary | ICD-10-CM | POA: Diagnosis present

## 2024-08-23 DIAGNOSIS — Z7901 Long term (current) use of anticoagulants: Secondary | ICD-10-CM

## 2024-08-23 DIAGNOSIS — D649 Anemia, unspecified: Secondary | ICD-10-CM | POA: Insufficient documentation

## 2024-08-23 DIAGNOSIS — I2510 Atherosclerotic heart disease of native coronary artery without angina pectoris: Secondary | ICD-10-CM | POA: Diagnosis present

## 2024-08-23 DIAGNOSIS — Z881 Allergy status to other antibiotic agents status: Secondary | ICD-10-CM

## 2024-08-23 DIAGNOSIS — I509 Heart failure, unspecified: Secondary | ICD-10-CM

## 2024-08-23 DIAGNOSIS — Z91119 Patient's noncompliance with dietary regimen due to unspecified reason: Secondary | ICD-10-CM

## 2024-08-23 DIAGNOSIS — D539 Nutritional anemia, unspecified: Secondary | ICD-10-CM | POA: Diagnosis present

## 2024-08-23 DIAGNOSIS — I5041 Acute combined systolic (congestive) and diastolic (congestive) heart failure: Secondary | ICD-10-CM | POA: Diagnosis present

## 2024-08-23 DIAGNOSIS — Z885 Allergy status to narcotic agent status: Secondary | ICD-10-CM

## 2024-08-23 DIAGNOSIS — Z8542 Personal history of malignant neoplasm of other parts of uterus: Secondary | ICD-10-CM

## 2024-08-23 DIAGNOSIS — Z888 Allergy status to other drugs, medicaments and biological substances status: Secondary | ICD-10-CM

## 2024-08-23 DIAGNOSIS — I10 Essential (primary) hypertension: Secondary | ICD-10-CM | POA: Diagnosis present

## 2024-08-23 DIAGNOSIS — K219 Gastro-esophageal reflux disease without esophagitis: Secondary | ICD-10-CM | POA: Diagnosis present

## 2024-08-23 DIAGNOSIS — R601 Generalized edema: Principal | ICD-10-CM

## 2024-08-23 NOTE — ED Triage Notes (Addendum)
 Pt says that she has increased swelling in her legs for about a week. She has an open wound to the left anterior leg (says has been there for about a month- serous drainage to dressing) area and a large fluid filled pocket to the right lower leg (about 2 days per the husband).  Denies any CP or SOB.

## 2024-08-24 ENCOUNTER — Emergency Department (HOSPITAL_COMMUNITY)

## 2024-08-24 ENCOUNTER — Inpatient Hospital Stay (HOSPITAL_COMMUNITY)

## 2024-08-24 ENCOUNTER — Encounter (HOSPITAL_COMMUNITY): Payer: Self-pay | Admitting: Internal Medicine

## 2024-08-24 DIAGNOSIS — Z91119 Patient's noncompliance with dietary regimen due to unspecified reason: Secondary | ICD-10-CM | POA: Diagnosis not present

## 2024-08-24 DIAGNOSIS — I480 Paroxysmal atrial fibrillation: Secondary | ICD-10-CM | POA: Diagnosis present

## 2024-08-24 DIAGNOSIS — F0390 Unspecified dementia without behavioral disturbance: Secondary | ICD-10-CM | POA: Diagnosis present

## 2024-08-24 DIAGNOSIS — M797 Fibromyalgia: Secondary | ICD-10-CM | POA: Diagnosis present

## 2024-08-24 DIAGNOSIS — Z66 Do not resuscitate: Secondary | ICD-10-CM | POA: Diagnosis present

## 2024-08-24 DIAGNOSIS — Z888 Allergy status to other drugs, medicaments and biological substances status: Secondary | ICD-10-CM | POA: Diagnosis not present

## 2024-08-24 DIAGNOSIS — R609 Edema, unspecified: Secondary | ICD-10-CM | POA: Diagnosis not present

## 2024-08-24 DIAGNOSIS — Z23 Encounter for immunization: Secondary | ICD-10-CM | POA: Diagnosis present

## 2024-08-24 DIAGNOSIS — I5041 Acute combined systolic (congestive) and diastolic (congestive) heart failure: Secondary | ICD-10-CM

## 2024-08-24 DIAGNOSIS — Z8542 Personal history of malignant neoplasm of other parts of uterus: Secondary | ICD-10-CM | POA: Diagnosis not present

## 2024-08-24 DIAGNOSIS — K219 Gastro-esophageal reflux disease without esophagitis: Secondary | ICD-10-CM | POA: Diagnosis present

## 2024-08-24 DIAGNOSIS — E782 Mixed hyperlipidemia: Secondary | ICD-10-CM

## 2024-08-24 DIAGNOSIS — I5043 Acute on chronic combined systolic (congestive) and diastolic (congestive) heart failure: Secondary | ICD-10-CM | POA: Diagnosis present

## 2024-08-24 DIAGNOSIS — I2510 Atherosclerotic heart disease of native coronary artery without angina pectoris: Secondary | ICD-10-CM | POA: Diagnosis present

## 2024-08-24 DIAGNOSIS — I50811 Acute right heart failure: Secondary | ICD-10-CM | POA: Diagnosis present

## 2024-08-24 DIAGNOSIS — H9192 Unspecified hearing loss, left ear: Secondary | ICD-10-CM | POA: Diagnosis present

## 2024-08-24 DIAGNOSIS — Z96652 Presence of left artificial knee joint: Secondary | ICD-10-CM | POA: Diagnosis present

## 2024-08-24 DIAGNOSIS — Z881 Allergy status to other antibiotic agents status: Secondary | ICD-10-CM | POA: Diagnosis not present

## 2024-08-24 DIAGNOSIS — I5082 Biventricular heart failure: Secondary | ICD-10-CM | POA: Diagnosis present

## 2024-08-24 DIAGNOSIS — I110 Hypertensive heart disease with heart failure: Secondary | ICD-10-CM | POA: Diagnosis present

## 2024-08-24 DIAGNOSIS — Z7901 Long term (current) use of anticoagulants: Secondary | ICD-10-CM | POA: Diagnosis not present

## 2024-08-24 DIAGNOSIS — Z981 Arthrodesis status: Secondary | ICD-10-CM | POA: Diagnosis not present

## 2024-08-24 DIAGNOSIS — I509 Heart failure, unspecified: Secondary | ICD-10-CM

## 2024-08-24 DIAGNOSIS — Z79899 Other long term (current) drug therapy: Secondary | ICD-10-CM | POA: Diagnosis not present

## 2024-08-24 DIAGNOSIS — Z823 Family history of stroke: Secondary | ICD-10-CM | POA: Diagnosis not present

## 2024-08-24 DIAGNOSIS — D539 Nutritional anemia, unspecified: Secondary | ICD-10-CM | POA: Diagnosis present

## 2024-08-24 DIAGNOSIS — Z885 Allergy status to narcotic agent status: Secondary | ICD-10-CM | POA: Diagnosis not present

## 2024-08-24 DIAGNOSIS — D649 Anemia, unspecified: Secondary | ICD-10-CM | POA: Insufficient documentation

## 2024-08-24 LAB — CBC WITH DIFFERENTIAL/PLATELET
Abs Immature Granulocytes: 0.01 K/uL (ref 0.00–0.07)
Basophils Absolute: 0 K/uL (ref 0.0–0.1)
Basophils Relative: 0 %
Eosinophils Absolute: 0.1 K/uL (ref 0.0–0.5)
Eosinophils Relative: 3 %
HCT: 34.6 % — ABNORMAL LOW (ref 36.0–46.0)
Hemoglobin: 10.9 g/dL — ABNORMAL LOW (ref 12.0–15.0)
Immature Granulocytes: 0 %
Lymphocytes Relative: 32 %
Lymphs Abs: 1.1 K/uL (ref 0.7–4.0)
MCH: 32 pg (ref 26.0–34.0)
MCHC: 31.5 g/dL (ref 30.0–36.0)
MCV: 101.5 fL — ABNORMAL HIGH (ref 80.0–100.0)
Monocytes Absolute: 0.3 K/uL (ref 0.1–1.0)
Monocytes Relative: 9 %
Neutro Abs: 2 K/uL (ref 1.7–7.7)
Neutrophils Relative %: 56 %
Platelets: 154 K/uL (ref 150–400)
RBC: 3.41 MIL/uL — ABNORMAL LOW (ref 3.87–5.11)
RDW: 15.5 % (ref 11.5–15.5)
WBC: 3.5 K/uL — ABNORMAL LOW (ref 4.0–10.5)
nRBC: 0 % (ref 0.0–0.2)

## 2024-08-24 LAB — URINALYSIS, ROUTINE W REFLEX MICROSCOPIC
Bacteria, UA: NONE SEEN
Bilirubin Urine: NEGATIVE
Glucose, UA: NEGATIVE mg/dL
Ketones, ur: NEGATIVE mg/dL
Leukocytes,Ua: NEGATIVE
Nitrite: NEGATIVE
Protein, ur: NEGATIVE mg/dL
Specific Gravity, Urine: 1.005 (ref 1.005–1.030)
pH: 7 (ref 5.0–8.0)

## 2024-08-24 LAB — RETICULOCYTES
Immature Retic Fract: 23.2 % — ABNORMAL HIGH (ref 2.3–15.9)
RBC.: 3.47 MIL/uL — ABNORMAL LOW (ref 3.87–5.11)
Retic Count, Absolute: 81.9 K/uL (ref 19.0–186.0)
Retic Ct Pct: 2.4 % (ref 0.4–3.1)

## 2024-08-24 LAB — VITAMIN B12: Vitamin B-12: 750 pg/mL (ref 180–914)

## 2024-08-24 LAB — CBC
HCT: 38.9 % (ref 36.0–46.0)
Hemoglobin: 11.8 g/dL — ABNORMAL LOW (ref 12.0–15.0)
MCH: 31 pg (ref 26.0–34.0)
MCHC: 30.3 g/dL (ref 30.0–36.0)
MCV: 102.1 fL — ABNORMAL HIGH (ref 80.0–100.0)
Platelets: 181 K/uL (ref 150–400)
RBC: 3.81 MIL/uL — ABNORMAL LOW (ref 3.87–5.11)
RDW: 15.5 % (ref 11.5–15.5)
WBC: 3.1 K/uL — ABNORMAL LOW (ref 4.0–10.5)
nRBC: 0 % (ref 0.0–0.2)

## 2024-08-24 LAB — BASIC METABOLIC PANEL WITH GFR
Anion gap: 11 (ref 5–15)
Anion gap: 11 (ref 5–15)
BUN: 17 mg/dL (ref 8–23)
BUN: 16 mg/dL (ref 8–23)
CO2: 24 mmol/L (ref 22–32)
CO2: 25 mmol/L (ref 22–32)
Calcium: 9.4 mg/dL (ref 8.9–10.3)
Calcium: 9.1 mg/dL (ref 8.9–10.3)
Chloride: 106 mmol/L (ref 98–111)
Chloride: 106 mmol/L (ref 98–111)
Creatinine, Ser: 0.86 mg/dL (ref 0.44–1.00)
Creatinine, Ser: 0.77 mg/dL (ref 0.44–1.00)
GFR, Estimated: >60 mL/min (ref >60)
GFR, Estimated: >60 mL/min (ref >60)
Glucose, Bld: 95 mg/dL (ref 70–99)
Glucose, Bld: 86 mg/dL (ref 70–99)
Potassium: 4 mmol/L (ref 3.5–5.1)
Potassium: 3.4 mmol/L — ABNORMAL LOW (ref 3.5–5.1)
Sodium: 141 mmol/L (ref 135–145)
Sodium: 142 mmol/L (ref 135–145)

## 2024-08-24 LAB — HEPATIC FUNCTION PANEL
ALT: 6 U/L (ref 0–44)
AST: 21 U/L (ref 15–41)
Albumin: 3.5 g/dL (ref 3.5–5.0)
Alkaline Phosphatase: 74 U/L (ref 38–126)
Bilirubin, Direct: 0.5 mg/dL — ABNORMAL HIGH (ref 0.0–0.2)
Indirect Bilirubin: 0.8 mg/dL (ref 0.3–0.9)
Total Bilirubin: 1.3 mg/dL — ABNORMAL HIGH (ref 0.0–1.2)
Total Protein: 6.9 g/dL (ref 6.5–8.1)

## 2024-08-24 LAB — FERRITIN: Ferritin: 15 ng/mL (ref 11–307)

## 2024-08-24 LAB — IRON AND TIBC
Iron: 62 ug/dL (ref 28–170)
Saturation Ratios: 19 % (ref 10.4–31.8)
TIBC: 333 ug/dL (ref 250–450)
UIBC: 271 ug/dL

## 2024-08-24 LAB — FOLATE: Folate: 10.5 ng/mL (ref >5.9)

## 2024-08-24 LAB — TROPONIN T, HIGH SENSITIVITY
Troponin T High Sensitivity: <15 ng/L (ref 0–19)
Troponin T High Sensitivity: <15 ng/L (ref 0–19)

## 2024-08-24 LAB — PRO BRAIN NATRIURETIC PEPTIDE: Pro Brain Natriuretic Peptide: 3985 pg/mL — ABNORMAL HIGH (ref <300.0)

## 2024-08-24 MED ORDER — ROSUVASTATIN CALCIUM 20 MG PO TABS
20.0000 mg | ORAL_TABLET | Freq: Every day | ORAL | Status: DC
  Administered 2024-08-24 – 2024-08-27 (×4): 20 mg via ORAL
  Filled 2024-08-24 (×4): qty 1

## 2024-08-24 MED ORDER — PNEUMOCOCCAL 20-VAL CONJ VACC 0.5 ML IM SUSY
0.5000 mL | PREFILLED_SYRINGE | INTRAMUSCULAR | Status: DC
  Filled 2024-08-24: qty 0.5

## 2024-08-24 MED ORDER — CLONAZEPAM 0.5 MG PO TABS: 0.5000 mg | ORAL_TABLET | Freq: Two times a day (BID) | ORAL | Status: DC | PRN

## 2024-08-24 MED ORDER — APIXABAN 5 MG PO TABS
5.0000 mg | ORAL_TABLET | Freq: Two times a day (BID) | ORAL | Status: DC
  Administered 2024-08-24 – 2024-08-27 (×7): 5 mg via ORAL
  Filled 2024-08-24 (×3): qty 1
  Filled 2024-08-24: qty 2
  Filled 2024-08-24 (×3): qty 1

## 2024-08-24 MED ORDER — POTASSIUM CHLORIDE CRYS ER 20 MEQ PO TBCR
20.0000 meq | EXTENDED_RELEASE_TABLET | Freq: Two times a day (BID) | ORAL | Status: DC
  Administered 2024-08-24 – 2024-08-27 (×7): 20 meq via ORAL
  Filled 2024-08-24 (×7): qty 1

## 2024-08-24 MED ORDER — SPIRONOLACTONE 12.5 MG HALF TABLET
12.5000 mg | ORAL_TABLET | Freq: Every day | ORAL | Status: DC
  Administered 2024-08-24 – 2024-08-27 (×4): 12.5 mg via ORAL
  Filled 2024-08-24 (×4): qty 1

## 2024-08-24 MED ORDER — ACETAMINOPHEN 325 MG PO TABS
650.0000 mg | ORAL_TABLET | Freq: Four times a day (QID) | ORAL | Status: DC | PRN
  Administered 2024-08-24 – 2024-08-27 (×2): 650 mg via ORAL
  Filled 2024-08-24 (×2): qty 2

## 2024-08-24 MED ORDER — INFLUENZA VAC SPLIT HIGH-DOSE 0.5 ML IM SUSY
0.5000 mL | PREFILLED_SYRINGE | INTRAMUSCULAR | Status: AC
  Administered 2024-08-27: 0.5 mL via INTRAMUSCULAR
  Filled 2024-08-24: qty 0.5

## 2024-08-24 MED ORDER — QUETIAPINE FUMARATE 25 MG PO TABS
12.5000 mg | ORAL_TABLET | Freq: Every day | ORAL | Status: DC
  Administered 2024-08-24 – 2024-08-26 (×3): 12.5 mg via ORAL
  Filled 2024-08-24 (×3): qty 1

## 2024-08-24 MED ORDER — ACETAMINOPHEN 650 MG RE SUPP: 650.0000 mg | Freq: Four times a day (QID) | RECTAL | Status: DC | PRN

## 2024-08-24 MED ORDER — METOPROLOL SUCCINATE ER 50 MG PO TB24
100.0000 mg | ORAL_TABLET | Freq: Every day | ORAL | Status: DC
  Administered 2024-08-24 – 2024-08-27 (×4): 100 mg via ORAL
  Filled 2024-08-24 (×4): qty 2

## 2024-08-24 MED ORDER — FUROSEMIDE 10 MG/ML IJ SOLN
40.0000 mg | Freq: Once | INTRAMUSCULAR | Status: AC
  Administered 2024-08-24: 40 mg via INTRAVENOUS
  Filled 2024-08-24: qty 4

## 2024-08-24 MED ORDER — FUROSEMIDE 10 MG/ML IJ SOLN
40.0000 mg | Freq: Two times a day (BID) | INTRAMUSCULAR | Status: DC
  Administered 2024-08-24 – 2024-08-27 (×7): 40 mg via INTRAVENOUS
  Filled 2024-08-24 (×7): qty 4

## 2024-08-24 NOTE — Plan of Care (Signed)
 Patient seen and rounded on this morning.  See H&P for full A&P. 83 year old female with PMH HFrEF, PAF, HLD, dementia.  She presented with worsening lower extremity edema with associated blebs weeping serous fluid. proBNP 3,985.  Troponin trend flat.  CXR showed mild cardiomegaly and no significant signs of volume overload. She was admitted for IV Lasix  for presumed decompensated CHF in setting of significantly fluid overloaded lower extremities.  Plan: - continue lasix  - strict I&O - Delirium precautions -Lower extremity duplex negative but limited on right  - Recent echo from July, no need to repeat.  EF 25 to 30%, global hypokinesis  Alm Apo, MD [facility] 08/24/2024, 3:54 PM

## 2024-08-24 NOTE — H&P (Signed)
 History and Physical    BRITTIN BELNAP FMW:[id number] DOB: 08-21-1941 DOA: 08/23/2024  Patient coming from: Home.  Chief Complaint: Lower extremity edema.  HPI: Phyllis Knight is a 83 y.o. female with history of chronic HFrEF, paroxysmal atrial fibrillation, hyperlipidemia, dementia who was recently admitted to the hospital in July 2025 for septic and cardiogenic shock was eventually discharged to home presents to the ER with complaints of worsening lower extremity edema.  Patient is a poor historian and patient's husband states that she has been compliant with her medications.  Denies chest pain but patient states she has been having intermittent episodes of shortness of breath.  ED Course: In the ER patient on exam is 3+ edema up to the mid thighs.  There is also blebs in the lower extremity from fluid overload.  Chest x-ray does not show anything acute.  proBNP was 3900.  Troponins were negative.  Patient was given Lasix  40 mg IV and admitted for decompensated CHF.  Labs show creatinine of 0.8 and hemoglobin of 11.8 macrocytic picture.  Review of Systems: As per HPI, rest all negative.   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   GERD (gastroesophageal reflux disease)    H/O hiatal hernia    Hearing loss    left ear    History of uterine cancer 1975   s/p hysterectomy   Hyperlipidemia    Hypertension    Numbness and tingling    hands and feet bilat    Pneumonia yrs ago   Shortness of breath dyspnea    Syncope 07/16/2018   Tinnitus    Urge urinary incontinence    Urinary incontinence    Vitamin D  deficiency     Past Surgical History:  Procedure Laterality Date   CERVICAL FUSION  2011    CHOLECYSTECTOMY N/A 06/14/2014   Procedure: LAPAROSCOPIC CHOLECYSTECTOMY WITH attempted INTRAOPERATIVE CHOLANGIOGRAM;  Surgeon: Alm VEAR Angle, MD;  Location: [facility];  Service: General;  Laterality: N/A;   INSERTION OF MESH N/A 07/18/2015   Procedure: INSERTION OF MESH;  Surgeon: Alm Angle, MD;  Location: [facility];  Service: General;  Laterality: N/A;   KNEE CLOSED REDUCTION Left 09/21/2013   Procedure: CLOSED MANIPULATION LEFT KNEE;  Surgeon: Donnice JONETTA Car, MD;  Location[facility];  Service: Orthopedics;  Laterality: Left;   TOTAL ABDOMINAL HYSTERECTOMY W/ BILATERAL SALPINGOOPHORECTOMY  1972   TOTAL KNEE ARTHROPLASTY Left 08/03/2013   Procedure: LEFT TOTAL KNEE ARTHROPLASTY;  Surgeon: Donnice JONETTA Car, MD;  Location: [facility];  Service: Orthopedics;  Laterality: Left;   tracheotomy     TYMPANOPLASTY Left    VENTRAL HERNIA REPAIR N/A 07/18/2015   Procedure: LAPAROSCOPIC VENTRAL AND UMBILICAL  HERNIA LYSIS OF ADHESIONS;  Surgeon: Alm Angle, MD;  Location: [facility];  Service: General;  Laterality: N/A;     reports that she has never smoked. She has never used smokeless tobacco. She reports that she does not drink alcohol and does not use drugs.  Allergies  Allergen Reactions   Cymbalta [Duloxetine Hcl] Other (See Comments)    jumping legs  Cefuroxime Rash   Codeine Nausea And Vomiting   Zofran  [Ondansetron  Hcl] Rash    Family History  Problem Relation Age of Onset   Stroke Mother    Stroke Father     Prior to Admission medications   Medication Sig Start Date End Date Taking? Authorizing Provider  clonazePAM  (KLONOPIN ) 0.5 MG tablet Take 0.5 mg by mouth 2 (two) times daily as needed for anxiety. 08/16/24  Yes [provider]  metoprolol  succinate (TOPROL -XL) 50 MG 24 hr tablet Take 50 mg by mouth daily. 06/27/24  Yes [provider]  acetaminophen  (TYLENOL ) 325 MG tablet Take 650 mg by mouth every 6 (six) hours as needed for mild pain.    [provider]  cetirizine (ZYRTEC) 10 MG tablet Take 10 mg by mouth daily as needed for rhinitis.    [provider]  cholecalciferol  (VITAMIN D ) 1000 units tablet Take 2,000 Units by mouth daily.     [provider]  ELIQUIS  5 MG TABS tablet TAKE 1 TABLET BY MOUTH TWICE  DAILY 11/12/22   Patwardhan, Manish J, MD  furosemide  (LASIX ) 40 MG tablet Take 1 tablet (40 mg total) by mouth daily. 06/26/24   Perri DELENA Meliton Mickey., MD  metoprolol  succinate (TOPROL -XL) 100 MG 24 hr tablet Take 1 tablet (100 mg total) by mouth daily. Take with or immediately following a meal. 06/26/24   Perri DELENA Meliton Mickey., MD  pantoprazole  (PROTONIX ) 40 MG tablet Take 1 tablet twice daily for 1 week and then 1 tablet once daily Patient taking differently: Take 40 mg by mouth daily as needed (for heartburn). 02/14/23   Krishnan, Gokul, MD  potassium chloride  (KLOR-CON ) 8 MEQ tablet Take 8 mEq by mouth daily.  04/17/15   [provider]  QUEtiapine  (SEROQUEL ) 25 MG tablet Take 0.5 tablets (12.5 mg total) by mouth at bedtime. 06/25/24   Perri DELENA Meliton Mickey., MD  QUEtiapine  (SEROQUEL ) 50 MG tablet Take 50 mg by mouth at bedtime.    [provider]  rosuvastatin  (CRESTOR ) 20 MG tablet TAKE 1 TABLET BY MOUTH DAILY 08/19/22   Patwardhan, Manish J, MD  senna-docusate (SENOKOT-S) 8.6-50 MG tablet Take 1 tablet by mouth 2 (two) times daily. Patient taking differently: Take 1 tablet by mouth daily as needed for mild constipation. 02/14/23   Krishnan, Gokul, MD  spironolactone  (ALDACTONE ) 25 MG tablet Take 0.5 tablets (12.5 mg total) by mouth daily. 06/26/24   Perri DELENA Meliton Mickey., MD    Physical Exam: Constitutional: Moderately built and nourished. Vitals:   08/24/24 0100 08/24/24 0115 08/24/24 0225 08/24/24 0258  BP:  (!) 169/106 (!) 136/120   Pulse:  99 100   Resp:  18 17   Temp:    97.6 F (36.4 C)  TempSrc:      SpO2:  99% 97%   Weight: 83.5 kg     Height: 5' 6 (1.676 m)      Eyes:  Anicteric no pallor. ENMT: No discharge from the ears eyes nose normal. Neck: No mass felt.  No neck rigidity. Respiratory: No rhonchi or crepitations. Cardiovascular: S1-S2 heard. Abdomen: Soft nontender bowel sound present. Musculoskeletal: 3+ edema both lower extremities. Skin: Blebs in the lower extremities. Neurologic: Alert awake oriented to place and person moving all extremities. Psychiatric: Oriented to person and place.   Labs on Admission: I have personally reviewed following labs and imaging studies  CBC: Recent Labs  Lab 08/23/24 2323  WBC 3.1*  HGB 11.8*  HCT 38.9  MCV 102.1*  PLT 181   Basic Metabolic Panel: Recent Labs  Lab 08/23/24 2323  NA 141  K 4.0  CL 106  CO2 24  GLUCOSE 95  BUN 17  CREATININE 0.86  CALCIUM  9.4   GFR: Estimated Creatinine Clearance: 53 mL/min (by C-G formula based on SCr of 0.86 mg/dL). Liver Function Tests: No results for input(s): AST, ALT, ALKPHOS, BILITOT, PROT, ALBUMIN  in the last 168 hours. No results for input(s): LIPASE, AMYLASE in the last 168 hours. No results for input(s): AMMONIA in the last 168 hours. Coagulation Profile: No results for input(s): INR, PROTIME in the last 168 hours. Cardiac Enzymes: No results for input(s): CKTOTAL, CKMB, CKMBINDEX, TROPONINI in the last 168 hours. BNP (last 3 results) Recent Labs    08/24/24 0143  PROBNP 3,985.0*   HbA1C: No results for input(s): HGBA1C in the last 72 hours. CBG: No results for input(s): GLUCAP in the last 168 hours. Lipid Profile: No results for input(s): CHOL, HDL, LDLCALC, TRIG, CHOLHDL, LDLDIRECT in the last 72 hours. Thyroid  Function Tests: No results for input(s): TSH, T4TOTAL, FREET4, T3FREE, THYROIDAB in the last 72 hours. Anemia Panel: No results for input(s): VITAMINB12, FOLATE, FERRITIN, TIBC, IRON, RETICCTPCT in the last 72 hours. Urine analysis:    Component Value  Date/Time   COLORURINE YELLOW 06/16/2024 1255   APPEARANCEUR CLEAR 06/16/2024 1255   LABSPEC >1.046 (H) 06/16/2024 1255   PHURINE 5.0 06/16/2024 1255   GLUCOSEU NEGATIVE 06/16/2024 1255   HGBUR SMALL (A) 06/16/2024 1255   BILIRUBINUR NEGATIVE 06/16/2024 1255   KETONESUR NEGATIVE 06/16/2024 1255   PROTEINUR 30 (A) 06/16/2024 1255   UROBILINOGEN 1.0 05/19/2014 2348   NITRITE NEGATIVE 06/16/2024 1255   LEUKOCYTESUR NEGATIVE 06/16/2024 1255   Sepsis Labs: @LABRCNTIP (procalcitonin:4,lacticidven:4) )No results found for this or any previous visit (from the past 240 hours).   Radiological Exams on Admission: DG Chest 2 View Result Date: 08/24/2024 CLINICAL DATA:  Increasing leg swelling.  Question CHF. EXAM: CHEST - 2 VIEW COMPARISON:  06/25/2024 FINDINGS: Heart is mildly enlarged. Scattered aortic atherosclerosis. No confluent opacities, effusions or edema. No acute bony abnormality. Degenerative changes in the shoulders. IMPRESSION: Mild cardiomegaly.  No evidence of CHF. Electronically Signed   By: Franky Crease M.D.   On: 08/24/2024 01:08    EKG: Independently reviewed.  A-fib rate of 112 bpm.  Assessment/Plan Principal Problem:   Acute combined systolic and diastolic heart failure (HCC) Active Problems:   Essential hypertension   Paroxysmal A-fib (HCC)   Dementia without behavioral disturbance (HCC)   Acute CHF (congestive heart failure) (HCC)    Acute on chronic HFrEF last EF measured on July 2025 was 25 to 30%.  Patient has been placed on Lasix  40 mg IV every 12 will continue patient's spironolactone .  If blood pressure allows start ARB/ACE inhibitor.  Continue metoprolol .  Will check Dopplers of the lower extremities. Atrial fibrillation with rate mildly elevated.  On beta-blockers.  On Eliquis  for rate control. Hypertension on metoprolol  spironolactone  and Lasix  at this time.  Follow blood pressure trends. Hyperlipidemia on statins. Macrocytic anemia appears to be chronic.   Follow CBC check B12 and folate levels. Dementia with no acute issues.  On Seroquel  Klonopin  as needed for mood disorder and anxiety. Recently admitted for shock secondary to sepsis and cardiogenic.  Since patient has acute CHF will need IV diuresis and more than 2 midnight stay.   DVT prophylaxis: Eliquis . Code Status: DNR. Family Communication: Patient's husband at the bedside. Disposition Plan:  Monitored bed. Consults called: None. Admission status: Inpatient.

## 2024-08-24 NOTE — Progress Notes (Signed)
 Heart Failure Navigator Progress Note  Assessed for Heart & Vascular TOC clinic readiness.  Patient does not meet criteria due to per MD note patient with Level 5 Dementia. No HF TOC. .   Navigator will sign off at this time.   Stephane Haddock, BSN, Scientist, clinical (histocompatibility and immunogenetics) Only

## 2024-08-24 NOTE — ED Provider Notes (Signed)
 [facility] EMERGENCY DEPARTMENT AT [facility] Provider Note   CSN: [id number] Arrival date & time: 08/23/24  2238     Patient presents with: Leg Swelling   Phyllis Knight is a 83 y.o. female.   The history is provided by the spouse. The history is limited by the condition of the patient (level 5 dementia).  Leg Pain Lower extremity pain location: BLE. Time since incident: weeks progressive. Injury: no   Pain details:    Quality:  Aching   Radiates to:  Does not radiate   Severity:  Moderate   Onset quality:  Gradual   Timing:  Constant   Progression:  Worsening Relieved by:  Nothing Worsened by:  Nothing Ineffective treatments:  None tried Associated symptoms: swelling   Associated symptoms: no back pain and no fever   Risk factors: no concern for non-accidental trauma   Patient with AFIB on DOAC and dementia presents with leg swelling and pain that is progressive.  She has refused to go to her appointments at doctors and may have missed dosages of medicaiton     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   GERD (gastroesophageal reflux disease)    H/O hiatal hernia    Hearing loss    left ear    History of uterine cancer 1975   s/p hysterectomy   Hyperlipidemia    Hypertension    Numbness and tingling    hands and feet bilat    Pneumonia yrs ago   Shortness of breath dyspnea    Syncope 07/16/2018   Tinnitus    Urge urinary incontinence    Urinary incontinence    Vitamin D  deficiency      Prior to Admission medications   Medication Sig Start Date End Date Taking? Authorizing Provider  acetaminophen  (TYLENOL ) 325 MG tablet Take 650 mg by mouth every 6 (six) hours as needed  for mild pain.    [provider]  cetirizine (ZYRTEC) 10 MG tablet Take 10 mg by mouth daily as needed for rhinitis.    [provider]  cholecalciferol  (VITAMIN D ) 1000 units tablet Take 2,000 Units by mouth daily.     [provider]  ELIQUIS  5 MG TABS tablet TAKE 1 TABLET BY MOUTH TWICE  DAILY 11/12/22   Patwardhan, Manish J, MD  furosemide  (LASIX ) 40 MG tablet Take 1 tablet (40 mg total) by mouth daily. 06/26/24   Perri DELENA Meliton Mickey., MD  metoprolol  succinate (TOPROL -XL) 100 MG 24 hr tablet Take 1 tablet (100 mg total) by mouth daily. Take with or immediately following a meal. 06/26/24   Perri DELENA Meliton Mickey., MD  pantoprazole  (PROTONIX ) 40 MG tablet Take 1 tablet twice daily for 1 week and then 1 tablet once daily Patient taking differently: Take 40 mg by mouth daily as needed (for heartburn). 02/14/23   Verdene Purchase, MD  potassium chloride  (KLOR-CON ) 8 MEQ tablet Take 8 mEq by mouth daily.  04/17/15   [provider]  QUEtiapine  (SEROQUEL ) 25 MG tablet Take 0.5 tablets (12.5 mg total) by mouth at bedtime.  06/25/24   Perri DELENA Meliton Mickey., MD  rosuvastatin  (CRESTOR ) 20 MG tablet TAKE 1 TABLET BY MOUTH DAILY 08/19/22   Patwardhan, Manish J, MD  senna-docusate (SENOKOT-S) 8.6-50 MG tablet Take 1 tablet by mouth 2 (two) times daily. Patient taking differently: Take 1 tablet by mouth daily as needed for mild constipation. 02/14/23   Krishnan, Gokul, MD  spironolactone  (ALDACTONE ) 25 MG tablet Take 0.5 tablets (12.5 mg total) by mouth daily. 06/26/24   Perri DELENA Meliton Mickey., MD    Allergies: Cymbalta [duloxetine hcl], Cefuroxime, Codeine, and Zofran  [ondansetron  hcl]    Review of Systems  Unable to perform ROS: Dementia  Constitutional:  Negative for fever.  Cardiovascular:  Positive for leg swelling. Negative for chest pain.  Musculoskeletal:  Negative for back pain.    Updated Vital Signs BP (!) 136/120   Pulse 100   Temp 98 F (36.7 C) (Oral)    Resp 17   Ht 5' 6 (1.676 m)   Wt 83.5 kg   SpO2 97%   BMI 29.71 kg/m   Physical Exam Vitals and nursing note reviewed.  Constitutional:      General: She is not in acute distress.    Appearance: Normal appearance. She is well-developed.  HENT:     Head: Normocephalic and atraumatic.     Nose: Nose normal.  Eyes:     Pupils: Pupils are equal, round, and reactive to light.  Cardiovascular:     Rate and Rhythm: Normal rate and regular rhythm.     Pulses: Normal pulses.     Heart sounds: Normal heart sounds.  Pulmonary:     Effort: Pulmonary effort is normal. No respiratory distress.     Breath sounds: Rales present.  Abdominal:     General: Bowel sounds are normal. There is no distension.     Palpations: Abdomen is soft.     Tenderness: There is no abdominal tenderness. There is no guarding or rebound.  Musculoskeletal:        General: Normal range of motion.     Cervical back: Normal range of motion and neck supple.     Right lower leg: Edema present.     Left lower leg: Edema present.     Comments: With fluid filled blisters   Skin:    General: Skin is dry.     Capillary Refill: Capillary refill takes less than 2 seconds.     Findings: No erythema or rash.  Neurological:     General: No focal deficit present.     Mental Status: She is alert.     Deep Tendon Reflexes: Reflexes normal.  Psychiatric:        Mood and Affect: Mood normal.     (all labs ordered are listed, but only abnormal results are displayed) Results for orders placed or performed during the hospital encounter of 08/23/24  Basic metabolic panel   Collection Time: 08/23/24 11:23 PM  Result Value Ref Range   Sodium 141 135 - 145 mmol/L   Potassium 4.0 3.5 - 5.1 mmol/L   Chloride 106 98 - 111 mmol/L   CO2 24 22 - 32 mmol/L   Glucose, Bld 95 70 - 99 mg/dL   BUN 17 8 - 23 mg/dL   Creatinine, Ser 9.13 0.44 - 1.00 mg/dL   Calcium  9.4 8.9 - 10.3 mg/dL   GFR, Estimated >39 >39 mL/min   Anion gap 11 5  - 15  CBC   Collection Time: 08/23/24 11:23 PM  Result Value Ref Range   WBC 3.1 (L) 4.0 - 10.5 K/uL   RBC 3.81 (L) 3.87 - 5.11 MIL/uL   Hemoglobin 11.8 (L) 12.0 - 15.0 g/dL   HCT 61.0 63.9 - 53.9 %   MCV 102.1 (H) 80.0 - 100.0 fL   MCH 31.0 26.0 - 34.0 pg   MCHC 30.3 30.0 - 36.0 g/dL   RDW 84.4 88.4 - 84.4 %   Platelets 181 150 - 400 K/uL   nRBC 0.0 0.0 - 0.2 %   DG Chest 2 View Result Date: 08/24/2024 CLINICAL DATA:  Increasing leg swelling.  Question CHF. EXAM: CHEST - 2 VIEW COMPARISON:  06/25/2024 FINDINGS: Heart is mildly enlarged. Scattered aortic atherosclerosis. No confluent opacities, effusions or edema. No acute bony abnormality. Degenerative changes in the shoulders. IMPRESSION: Mild cardiomegaly.  No evidence of CHF. Electronically Signed   By: Franky Crease M.D.   On: 08/24/2024 01:08    EKG: EKG Interpretation Date/Time:  Tuesday August 24 2024 01:34:23 EDT Ventricular Rate:  112 PR Interval:    QRS Duration:  90 QT Interval:  358 QTC Calculation: 489 R Axis:   16  Text Interpretation: Atrial fibrillation Borderline T abnormalities, lateral leads Confirmed by Varun Jourdan ([id number]) on 08/24/2024 1:39:38 AM  Radiology: [facility] Chest 2 View Result Date: 08/24/2024 CLINICAL DATA:  Increasing leg swelling.  Question CHF. EXAM: CHEST - 2 VIEW COMPARISON:  06/25/2024 FINDINGS: Heart is mildly enlarged. Scattered aortic atherosclerosis. No confluent opacities, effusions or edema. No acute bony abnormality. Degenerative changes in the shoulders. IMPRESSION: Mild cardiomegaly.  No evidence of CHF. Electronically Signed   By: Franky Crease M.D.   On: 08/24/2024 01:08     Procedures   Medications Ordered in the ED  furosemide  (LASIX ) injection 40 mg (40 mg Intravenous Given 08/24/24 0229)                                    Medical Decision Making Patient with edema and blisters for weeks worsening   Amount and/or Complexity of Data Reviewed Independent Historian:  spouse    Details: See above  External Data Reviewed: notes.    Details: Previous notes reviewed  Labs: ordered.    Details: Low white count 3.1, low hemoglobin 11.8, normal platelets. Normal sodium 141, normal potassium 4, normal 0.86 Radiology: ordered and independent interpretation performed.    Details: CHF by me  ECG/medicine tests: ordered and independent interpretation performed.  Risk Prescription drug management. Decision regarding hospitalization.     Final diagnoses:  Anasarca  Acute right-sided congestive heart failure (HCC)   The patient appears reasonably stabilized for admission considering the current resources, flow, and capabilities available in the ED at this time, and I doubt any other [facility] requiring further screening and/or treatment in the ED prior to admission.  ED Discharge Orders     None          Bayley Yarborough, MD 08/24/24 9753

## 2024-08-25 DIAGNOSIS — I5041 Acute combined systolic (congestive) and diastolic (congestive) heart failure: Secondary | ICD-10-CM | POA: Diagnosis not present

## 2024-08-25 LAB — CBC WITH DIFFERENTIAL/PLATELET
Abs Immature Granulocytes: 0 K/uL (ref 0.00–0.07)
Basophils Absolute: 0 K/uL (ref 0.0–0.1)
Basophils Relative: 0 %
Eosinophils Absolute: 0.1 K/uL (ref 0.0–0.5)
Eosinophils Relative: 4 %
HCT: 34.8 % — ABNORMAL LOW (ref 36.0–46.0)
Hemoglobin: 10.6 g/dL — ABNORMAL LOW (ref 12.0–15.0)
Immature Granulocytes: 0 %
Lymphocytes Relative: 33 %
Lymphs Abs: 1.1 K/uL (ref 0.7–4.0)
MCH: 30.6 pg (ref 26.0–34.0)
MCHC: 30.5 g/dL (ref 30.0–36.0)
MCV: 100.6 fL — ABNORMAL HIGH (ref 80.0–100.0)
Monocytes Absolute: 0.3 K/uL (ref 0.1–1.0)
Monocytes Relative: 10 %
Neutro Abs: 1.8 K/uL (ref 1.7–7.7)
Neutrophils Relative %: 53 %
Platelets: 170 K/uL (ref 150–400)
RBC: 3.46 MIL/uL — ABNORMAL LOW (ref 3.87–5.11)
RDW: 15.6 % — ABNORMAL HIGH (ref 11.5–15.5)
WBC: 3.3 K/uL — ABNORMAL LOW (ref 4.0–10.5)
nRBC: 0 % (ref 0.0–0.2)

## 2024-08-25 LAB — BASIC METABOLIC PANEL WITH GFR
Anion gap: 12 (ref 5–15)
BUN: 15 mg/dL (ref 8–23)
CO2: 24 mmol/L (ref 22–32)
Calcium: 8.7 mg/dL — ABNORMAL LOW (ref 8.9–10.3)
Chloride: 106 mmol/L (ref 98–111)
Creatinine, Ser: 0.77 mg/dL (ref 0.44–1.00)
GFR, Estimated: >60 mL/min (ref >60)
Glucose, Bld: 86 mg/dL (ref 70–99)
Potassium: 3.6 mmol/L (ref 3.5–5.1)
Sodium: 142 mmol/L (ref 135–145)

## 2024-08-25 LAB — MAGNESIUM: Magnesium: 1.8 mg/dL (ref 1.7–2.4)

## 2024-08-25 MED ORDER — ENSURE PLUS HIGH PROTEIN PO LIQD
237.0000 mL | Freq: Two times a day (BID) | ORAL | Status: DC
  Administered 2024-08-26 (×2): 237 mL via ORAL

## 2024-08-25 NOTE — Consult Note (Addendum)
 WOC Nurse Consult Note: Reason for Consult: lower extremity wounds  Wound type: Full thickness L lower leg and partial thickness R lower leg r/t fluid overload vs venous insufficiency  Pressure Injury POA: NA not pressure  Measurement: see nursing flowsheet  Wound bed:red hemorrhagic tissue L lower leg; pink moist R  Drainage (amount, consistency, odor) serous  Periwound: edema to B lower legs with weeping on admission  Dressing procedure/placement/frequency: Cleanse B lower leg wounds with Vashe, do not rinse, allow to air dry. Apply Xeroform gauze (Lawson [id number]) to wound beds daily and secure with silicone foam or if legs still weeping apply ABD pads and wrap in Kerlix roll gauze beginning right above toes and ending right below knees.    POC discussed with bedside nurse. WOC team will not follow. Reconsult if further needs arise.   Thank you,    Powell Bar MSN, RN-BC, [employer]

## 2024-08-25 NOTE — Progress Notes (Signed)
 Mobility Specialist - Progress Note   08/25/24 1444  Mobility  Activity Stood at bedside;Pivoted/transferred from bed to chair  Level of Assistance Minimal assist, patient does 75% or more  Assistive Device Front wheel walker  Distance Ambulated (ft) 3 ft  Range of Motion/Exercises Active  Activity Response Tolerated well  Mobility visit 1 Mobility  Mobility Specialist Start Time (ACUTE ONLY) 1430  Mobility Specialist Stop Time (ACUTE ONLY) 1444  Mobility Specialist Time Calculation (min) (ACUTE ONLY) 14 min   Pt was found in bed and agreeable to mobilize. Did x1 STS prior to transfer to recliner chair. At EOS was left with chair alarm on. Call bell in reach and NT in room.   Erminio Leos,  Mobility Specialist Can be reached via Secure Chat

## 2024-08-25 NOTE — Progress Notes (Signed)
 PROGRESS NOTE    Phyllis Knight  FMW:[id number] DOB: 09-30-1941 DOA: 08/23/2024 PCP: Claudene Pellet, MD    Brief Narrative:   Phyllis Knight is a 83 y.o. female with past medical history significant for chronic systolic congestive heart failure, paroxysmal atrial fibrillation, hyperlipidemia, dementia who presented to [facility] ED on 08/23/2024 with progressive swelling to bilateral lower extremities with serous drainage over the last week. Patient is a poor historian and patient's husband states that she has been compliant with her medications. Denies chest pain but patient states she has been having intermittent episodes of shortness of breath.   Recently hospitalized July 2025 for septic and cardiogenic shock.   In the ED, temperature 98.0 F, HR 87, RR 16, BP 177/112, SpO2 99% room air.  WBC 3.1, hemoglobin 11.8, platelet count 181.  Sodium 141, potassium 4.0, chloride 106, CO2 24, glucose 95, BUN 17, creatinine 0.86.  BNP 3985.0.  High sensitive troponin less than 15 x 2.  Urinalysis unrevealing.  Chest x-ray with mild cardiomegaly.  Vascular duplex ultrasound bilateral lower extremities negative for DVT.  Patient was given furosemide  40 mg IV x 1.  TRH consulted for admission for further evaluation and management of decompensated CHF.  Assessment & Plan:   Acute on chronic systolic congestive heart failure Patient presenting to the ED with progressive lower extremity edema x 1 week.  Reports compliance with her outpatient medications.  Patient with bilateral lower extremity edema.  Chest x-ray with cardiomegaly, elevated BNP of 3900.  Vascular duplex ultrasound bilateral lower extremities negative for DVT.  TTE 06/17/2024 with LVEF 25-30%, LV with global hypokinesis, RV systolic function moderately reduced, biatrial enlargement, IVC dilated. -- net negative 1.5L past 24h and net negative 3.2L since admission -- Lasix  40mg  IV q12h -- Metoprolol  succinate 100 mg p.o. daily --  Spironolactone  12.5 mg p.o. daily -- 1200 mL fluid restriction -- Strict I's and O's and daily weights -- BMP daily  Paroxysmal atrial fibrillation -- Metoprolol  succinate 100 mg p.o. daily -- Eliquis  5 mg p.o. twice daily  Hyperlipidemia -- Crestor  20 g p.o. daily  Dementia --Delirium precautions --Get up during the day --Encourage a familiar face to remain present throughout the day --Keep blinds open and lights on during daylight hours --Minimize the use of opioids/benzodiazepines --Seroquel  12.5 mg p.o. qHS   DVT prophylaxis:  apixaban  (ELIQUIS ) tablet 5 mg    Code Status: Limited: Do not attempt resuscitation (DNR) -DNR-LIMITED -Do Not Intubate/DNI  Family Communication: No family present at bedside this morning  Disposition Plan:  Level of care: Telemetry Status is: Inpatient Remains inpatient appropriate because: IV diuresis    Consultants:  None  Procedures:  None  Antimicrobials:  None   Subjective: Patient seen examined bedside, lying in bed.  Pleasantly confused.  RN present at bedside changing lower extremity dressings.  No specific complaints this morning.  Feels like her legs are improving.  No other questions or concerns.  Denies headache, no chest pain, no shortness of breath, no abdominal pain, no fever.  No acute events overnight per nursing staff.  Objective: Vitals:   08/25/24 0757 08/25/24 0914 08/25/24 1019 08/25/24 1445  BP: 137/89 119/80  132/80  Pulse: 79 64  74  Resp: (!) 21   16  Temp: 97.9 F (36.6 C)   97.9 F (36.6 C)  TempSrc: Oral   Oral  SpO2: 100%   98%  Weight:   78.3 kg 74.9 kg  Height:  Intake/Output Summary (Last 24 hours) at 08/25/2024 1554 Last data filed at 08/25/2024 1400 Gross per 24 hour  Intake 940 ml  Output 2201 ml  Net -1261 ml   Filed Weights   08/24/24 0100 08/25/24 1019 08/25/24 1445  Weight: 83.5 kg 78.3 kg 74.9 kg    Examination:  Physical Exam: GEN: NAD, alert; pleasantly confused   HEENT: NCAT, PERRL, EOMI, sclera clear, MMM PULM: CTAB w/o wheezes/crackles, normal respiratory effort, on room air CV: RRR w/o M/G/R GI: abd soft, NTND, + BS MSK: 1+ bilateral lower extremity edema, moves all extremities independently NEURO: No focal neurological deficit PSYCH: normal mood/affect Integumentary: Wounds noted to bilateral lower extremities with blisters, serous drainage as depicted below       Data Reviewed: I have personally reviewed following labs and imaging studies  CBC: Recent Labs  Lab 08/23/24 2323 08/24/24 0650 08/25/24 0419  WBC 3.1* 3.5* 3.3*  NEUTROABS  --  2.0 1.8  HGB 11.8* 10.9* 10.6*  HCT 38.9 34.6* 34.8*  MCV 102.1* 101.5* 100.6*  PLT 181 154 170   Basic Metabolic Panel: Recent Labs  Lab 08/23/24 2323 08/24/24 0650 08/25/24 0419  NA 141 142 142  K 4.0 3.4* 3.6  CL 106 106 106  CO2 24 25 24   GLUCOSE 95 86 86  BUN 17 16 15   CREATININE 0.86 0.77 0.77  CALCIUM  9.4 9.1 8.7*  MG  --   --  1.8   GFR: Estimated Creatinine Clearance: 54.1 mL/min (by C-G formula based on SCr of 0.77 mg/dL). Liver Function Tests: Recent Labs  Lab 08/24/24 0650  AST 21  ALT 6  ALKPHOS 74  BILITOT 1.3*  PROT 6.9  ALBUMIN  3.5   No results for input(s): LIPASE, AMYLASE in the last 168 hours. No results for input(s): AMMONIA in the last 168 hours. Coagulation Profile: No results for input(s): INR, PROTIME in the last 168 hours. Cardiac Enzymes: No results for input(s): CKTOTAL, CKMB, CKMBINDEX, TROPONINI in the last 168 hours. BNP (last 3 results) Recent Labs    08/24/24 0143  PROBNP 3,985.0*   HbA1C: No results for input(s): HGBA1C in the last 72 hours. CBG: No results for input(s): GLUCAP in the last 168 hours. Lipid Profile: No results for input(s): CHOL, HDL, LDLCALC, TRIG, CHOLHDL, LDLDIRECT in the last 72 hours. Thyroid  Function Tests: No results for input(s): TSH, T4TOTAL, FREET4, T3FREE,  THYROIDAB in the last 72 hours. Anemia Panel: Recent Labs    08/24/24 0650  VITAMINB12 750  FOLATE 10.5  FERRITIN 15  TIBC 333  IRON 62  RETICCTPCT 2.4   Sepsis Labs: No results for input(s): PROCALCITON, LATICACIDVEN in the last 168 hours.  No results found for this or any previous visit (from the past 240 hours).       Radiology Studies: VAS US  LOWER EXTREMITY VENOUS (DVT) Result Date: 08/24/2024  Lower Venous DVT Study Patient Name:  Phyllis Knight  Date of Exam:   08/24/2024 Medical Rec #: [id number]         Accession #:    [id number] Date of Birth: July 12, 1941         Patient Gender: F Patient Age:   43 years Exam Location:  [facility] Procedure:      VAS US  LOWER EXTREMITY VENOUS (DVT) Referring Phys: REDIA CLEAVER --------------------------------------------------------------------------------  Indications: Edema.  Risk Factors: Anemia, a-fib. Limitations: Body habitus. Comparison Study: No prior exam. Performing Technologist: Edilia Elden Appl  Examination Guidelines: A complete evaluation includes B-mode imaging,  spectral Doppler, color Doppler, and power Doppler as needed of all accessible portions of each vessel. Bilateral testing is considered an integral part of a complete examination. Limited examinations for reoccurring indications may be performed as noted. The reflux portion of the exam is performed with the patient in reverse Trendelenburg.  +---------+---------------+---------+-----------+----------+----------------+ RIGHT    CompressibilityPhasicitySpontaneityPropertiesThrombus Aging   +---------+---------------+---------+-----------+----------+----------------+ CFV      Full           Yes      Yes                                   +---------+---------------+---------+-----------+----------+----------------+ SFJ      Full           Yes      Yes                                    +---------+---------------+---------+-----------+----------+----------------+ FV Prox  Full                                                          +---------+---------------+---------+-----------+----------+----------------+ FV Mid   Full                                                          +---------+---------------+---------+-----------+----------+----------------+ FV DistalFull                                                          +---------+---------------+---------+-----------+----------+----------------+ PFV      Full                                                          +---------+---------------+---------+-----------+----------+----------------+ POP      Full           Yes      Yes                                   +---------+---------------+---------+-----------+----------+----------------+ PTV      Full                                                          +---------+---------------+---------+-----------+----------+----------------+ PERO  Patent by color. +---------+---------------+---------+-----------+----------+----------------+   +---------+---------------+---------+-----------+----------+--------------+ LEFT     CompressibilityPhasicitySpontaneityPropertiesThrombus Aging +---------+---------------+---------+-----------+----------+--------------+ CFV      Full           Yes      Yes                                 +---------+---------------+---------+-----------+----------+--------------+ SFJ      Full           Yes      Yes                                 +---------+---------------+---------+-----------+----------+--------------+ FV Prox  Full                                                        +---------+---------------+---------+-----------+----------+--------------+ FV Mid   Full                                                         +---------+---------------+---------+-----------+----------+--------------+ FV DistalFull                                                        +---------+---------------+---------+-----------+----------+--------------+ PFV      Full                                                        +---------+---------------+---------+-----------+----------+--------------+ POP      Full           Yes      Yes                                 +---------+---------------+---------+-----------+----------+--------------+ PTV      Full                                                        +---------+---------------+---------+-----------+----------+--------------+ PERO     Full                                                        +---------+---------------+---------+-----------+----------+--------------+     Summary: RIGHT: - There is no evidence of deep vein thrombosis in the lower extremity. However, portions of this examination were limited- see technologist comments above.  - No cystic structure found in the popliteal fossa.  LEFT: - There is no  evidence of deep vein thrombosis in the lower extremity.  - No cystic structure found in the popliteal fossa.  *See table(s) above for measurements and observations. Electronically signed by Debby Robertson on 08/24/2024 at 5:13:48 PM.    Final    DG Chest 2 View Result Date: 08/24/2024 CLINICAL DATA:  Increasing leg swelling.  Question CHF. EXAM: CHEST - 2 VIEW COMPARISON:  06/25/2024 FINDINGS: Heart is mildly enlarged. Scattered aortic atherosclerosis. No confluent opacities, effusions or edema. No acute bony abnormality. Degenerative changes in the shoulders. IMPRESSION: Mild cardiomegaly.  No evidence of CHF. Electronically Signed   By: Franky Crease M.D.   On: 08/24/2024 01:08        Scheduled Meds:  apixaban   5 mg Oral BID   feeding supplement  237 mL Oral BID BM   furosemide   40 mg Intravenous Q12H   Influenza vac split trivalent PF   0.5 mL Intramuscular Tomorrow-1000   metoprolol  succinate  100 mg Oral Daily   pneumococcal 20-valent conjugate vaccine  0.5 mL Intramuscular Tomorrow-1000   potassium chloride   20 mEq Oral BID   QUEtiapine   12.5 mg Oral QHS   rosuvastatin   20 mg Oral Daily   spironolactone   12.5 mg Oral Daily   Continuous Infusions:   LOS: 1 day    Time spent: 50 minutes spent on 08/25/2024 caring for this patient face-to-face including chart review, ordering labs/tests, documenting, discussion with nursing staff, consultants, updating family and interview/physical exam    Phyllis PARAS [place], DO [facility] Available via Epic secure chat 7am-7pm After these hours, please refer to coverage provider listed on [email or web address] 08/25/2024, 3:54 PM

## 2024-08-26 DIAGNOSIS — I5041 Acute combined systolic (congestive) and diastolic (congestive) heart failure: Secondary | ICD-10-CM | POA: Diagnosis not present

## 2024-08-26 LAB — BASIC METABOLIC PANEL WITH GFR
Anion gap: 9 (ref 5–15)
BUN: 15 mg/dL (ref 8–23)
CO2: 26 mmol/L (ref 22–32)
Calcium: 9 mg/dL (ref 8.9–10.3)
Chloride: 104 mmol/L (ref 98–111)
Creatinine, Ser: 0.81 mg/dL (ref 0.44–1.00)
GFR, Estimated: >60 mL/min (ref >60)
Glucose, Bld: 88 mg/dL (ref 70–99)
Potassium: 4 mmol/L (ref 3.5–5.1)
Sodium: 139 mmol/L (ref 135–145)

## 2024-08-26 LAB — MAGNESIUM: Magnesium: 2 mg/dL (ref 1.7–2.4)

## 2024-08-26 NOTE — Plan of Care (Signed)
  Problem: Education: Goal: Knowledge of General Education information will improve Description: Including pain rating scale, medication(s)/side effects and non-pharmacologic comfort measures Outcome: Progressing   Problem: Health Behavior/Discharge Planning: Goal: Ability to manage health-related needs will improve Outcome: Progressing   Problem: Clinical Measurements: Goal: Ability to maintain clinical measurements within normal limits will improve Outcome: Progressing Goal: Will remain free from infection Outcome: Progressing Goal: Diagnostic test results will improve Outcome: Progressing Goal: Respiratory complications will improve Outcome: Progressing Goal: Cardiovascular complication will be avoided Outcome: Progressing   Problem: Activity: Goal: Risk for activity intolerance will decrease Outcome: Progressing   Problem: Nutrition: Goal: Adequate nutrition will be maintained Outcome: Progressing   Problem: Coping: Goal: Level of anxiety will decrease Outcome: Progressing   Problem: Elimination: Goal: Will not experience complications related to bowel motility Outcome: Progressing Goal: Will not experience complications related to urinary retention Outcome: Progressing   Problem: Pain Managment: Goal: General experience of comfort will improve and/or be controlled Outcome: Progressing   Problem: Safety: Goal: Ability to remain free from injury will improve Outcome: Progressing   Problem: Skin Integrity: Goal: Risk for impaired skin integrity will decrease Outcome: Progressing   Problem: Cardiac: Goal: Ability to achieve and maintain adequate cardiopulmonary perfusion will improve Outcome: Progressing

## 2024-08-26 NOTE — Progress Notes (Signed)
 PROGRESS NOTE    Phyllis Knight  FMW:[id number] DOB: 1941/11/20 DOA: 08/23/2024 PCP: Claudene Pellet, MD    Brief Narrative:   Phyllis Knight is a 83 y.o. female with past medical history significant for chronic systolic congestive heart failure, paroxysmal atrial fibrillation, hyperlipidemia, dementia who presented to [facility] ED on 08/23/2024 with progressive swelling to bilateral lower extremities with serous drainage over the last week. Patient is a poor historian and patient's husband states that she has been compliant with her medications. Denies chest pain but patient states she has been having intermittent episodes of shortness of breath.   Recently hospitalized July 2025 for septic and cardiogenic shock.   In the ED, temperature 98.0 F, HR 87, RR 16, BP 177/112, SpO2 99% room air.  WBC 3.1, hemoglobin 11.8, platelet count 181.  Sodium 141, potassium 4.0, chloride 106, CO2 24, glucose 95, BUN 17, creatinine 0.86.  BNP 3985.0.  High sensitive troponin less than 15 x 2.  Urinalysis unrevealing.  Chest x-ray with mild cardiomegaly.  Vascular duplex ultrasound bilateral lower extremities negative for DVT.  Patient was given furosemide  40 mg IV x 1.  TRH consulted for admission for further evaluation and management of decompensated CHF.  Assessment & Plan:   Acute on chronic systolic congestive heart failure Patient presenting to the ED with progressive lower extremity edema x 1 week.  Reports compliance with her outpatient medications.  Patient with bilateral lower extremity edema.  Chest x-ray with cardiomegaly, elevated BNP of 3900.  Vascular duplex ultrasound bilateral lower extremities negative for DVT.  TTE 06/17/2024 with LVEF 25-30%, LV with global hypokinesis, RV systolic function moderately reduced, biatrial enlargement, IVC dilated. -- net negative 1.5L past 24h and net negative 3.2L since admission -- Lasix  40mg  IV q12h -- Metoprolol  succinate 100 mg p.o. daily --  Spironolactone  12.5 mg p.o. daily -- 1200 mL fluid restriction -- Strict I's and O's and daily weights -- BMP daily  Paroxysmal atrial fibrillation -- Metoprolol  succinate 100 mg p.o. daily -- Eliquis  5 mg p.o. twice daily  Hyperlipidemia -- Crestor  20 g p.o. daily  Dementia --Delirium precautions --Get up during the day --Encourage a familiar face to remain present throughout the day --Keep blinds open and lights on during daylight hours --Minimize the use of opioids/benzodiazepines --Seroquel  12.5 mg p.o. qHS   DVT prophylaxis:  apixaban  (ELIQUIS ) tablet 5 mg    Code Status: Limited: Do not attempt resuscitation (DNR) -DNR-LIMITED -Do Not Intubate/DNI  Family Communication: No family present at bedside this morning  Disposition Plan:  Level of care: Telemetry Status is: Inpatient Remains inpatient appropriate because: IV diuresis    Consultants:  None  Procedures:  None  Antimicrobials:  None   Subjective: Patient seen examined bedside, lying in bed.  Pleasantly confused.  RN and husband present at bedside.  Updated patient's son via telephone in room.  Edema improving.  Discussed with husband that when she returns home needs to keep her extremities elevated.  Denies headache, no chest pain, no shortness of breath, no abdominal pain, no fever.  No acute events overnight per nursing staff.  Objective: Vitals:   08/25/24 1953 08/25/24 2140 08/26/24 0500 08/26/24 0556  BP: 111/77 (!) 133/119  (!) 133/95  Pulse: 81 85  73  Resp: 18 20  20   Temp: 97.9 F (36.6 C) 98.1 F (36.7 C)  97.8 F (36.6 C)  TempSrc: Oral Oral  Oral  SpO2: 98% 97%  98%  Weight:   73.6  kg   Height:        Intake/Output Summary (Last 24 hours) at 08/26/2024 1039 Last data filed at 08/26/2024 0948 Gross per 24 hour  Intake 840 ml  Output 4351 ml  Net -3511 ml   Filed Weights   08/25/24 1019 08/25/24 1445 08/26/24 0500  Weight: 78.3 kg 74.9 kg 73.6 kg    Examination:  Physical  Exam: GEN: NAD, alert; pleasantly confused  HEENT: NCAT, PERRL, EOMI, sclera clear, MMM PULM: CTAB w/o wheezes/crackles, normal respiratory effort, on room air CV: RRR w/o M/G/R GI: abd soft, NTND, + BS MSK: 1+ bilateral lower extremity edema, moves all extremities independently NEURO: No focal neurological deficit PSYCH: normal mood/affect Integumentary: Wounds noted to bilateral lower extremities with blisters, serous drainage as depicted below       Data Reviewed: I have personally reviewed following labs and imaging studies  CBC: Recent Labs  Lab 08/23/24 2323 08/24/24 0650 08/25/24 0419  WBC 3.1* 3.5* 3.3*  NEUTROABS  --  2.0 1.8  HGB 11.8* 10.9* 10.6*  HCT 38.9 34.6* 34.8*  MCV 102.1* 101.5* 100.6*  PLT 181 154 170   Basic Metabolic Panel: Recent Labs  Lab 08/23/24 2323 08/24/24 0650 08/25/24 0419 08/26/24 0404  NA 141 142 142 139  K 4.0 3.4* 3.6 4.0  CL 106 106 106 104  CO2 24 25 24 26   GLUCOSE 95 86 86 88  BUN 17 16 15 15   CREATININE 0.86 0.77 0.77 0.81  CALCIUM  9.4 9.1 8.7* 9.0  MG  --   --  1.8 2.0   GFR: Estimated Creatinine Clearance: 53.1 mL/min (by C-G formula based on SCr of 0.81 mg/dL). Liver Function Tests: Recent Labs  Lab 08/24/24 0650  AST 21  ALT 6  ALKPHOS 74  BILITOT 1.3*  PROT 6.9  ALBUMIN  3.5   No results for input(s): LIPASE, AMYLASE in the last 168 hours. No results for input(s): AMMONIA in the last 168 hours. Coagulation Profile: No results for input(s): INR, PROTIME in the last 168 hours. Cardiac Enzymes: No results for input(s): CKTOTAL, CKMB, CKMBINDEX, TROPONINI in the last 168 hours. BNP (last 3 results) Recent Labs    08/24/24 0143  PROBNP 3,985.0*   HbA1C: No results for input(s): HGBA1C in the last 72 hours. CBG: No results for input(s): GLUCAP in the last 168 hours. Lipid Profile: No results for input(s): CHOL, HDL, LDLCALC, TRIG, CHOLHDL, LDLDIRECT in the last 72  hours. Thyroid  Function Tests: No results for input(s): TSH, T4TOTAL, FREET4, T3FREE, THYROIDAB in the last 72 hours. Anemia Panel: Recent Labs    08/24/24 0650  VITAMINB12 750  FOLATE 10.5  FERRITIN 15  TIBC 333  IRON 62  RETICCTPCT 2.4   Sepsis Labs: No results for input(s): PROCALCITON, LATICACIDVEN in the last 168 hours.  No results found for this or any previous visit (from the past 240 hours).       Radiology Studies: No results found.       Scheduled Meds:  apixaban   5 mg Oral BID   feeding supplement  237 mL Oral BID BM   furosemide   40 mg Intravenous Q12H   Influenza vac split trivalent PF  0.5 mL Intramuscular Tomorrow-1000   metoprolol  succinate  100 mg Oral Daily   pneumococcal 20-valent conjugate vaccine  0.5 mL Intramuscular Tomorrow-1000   potassium chloride   20 mEq Oral BID   QUEtiapine   12.5 mg Oral QHS   rosuvastatin   20 mg Oral Daily   spironolactone   12.5 mg Oral Daily   Continuous Infusions:   LOS: 2 days    Time spent: 50 minutes spent on 08/26/2024 caring for this patient face-to-face including chart review, ordering labs/tests, documenting, discussion with nursing staff, consultants, updating family and interview/physical exam    Camellia PARAS [place], DO [facility] Available via Epic secure chat 7am-7pm After these hours, please refer to coverage provider listed on [email or web address] 08/26/2024, 10:39 AM

## 2024-08-26 NOTE — Progress Notes (Signed)
   08/26/24 1417  TOC Brief Assessment  Insurance and Status Reviewed  Patient has primary care physician Yes  Home environment has been reviewed home with spouse  Prior level of function: independent  Prior/Current Home Services No current home services  Social Drivers of Health Review SDOH reviewed no interventions necessary  Readmission risk has been reviewed Yes  Transition of care needs no transition of care needs at this time

## 2024-08-26 NOTE — Plan of Care (Signed)
  Problem: Health Behavior/Discharge Planning: Goal: Ability to manage health-related needs will improve Outcome: Progressing   Problem: Clinical Measurements: Goal: Diagnostic test results will improve 08/26/2024 0455 by Vincente Grip, RN Outcome: Progressing 08/26/2024 0450 by Vincente Grip, RN Outcome: Progressing   Problem: Cardiac: Goal: Ability to achieve and maintain adequate cardiopulmonary perfusion will improve Outcome: Progressing

## 2024-08-27 ENCOUNTER — Other Ambulatory Visit (HOSPITAL_COMMUNITY): Payer: Self-pay

## 2024-08-27 DIAGNOSIS — I5041 Acute combined systolic (congestive) and diastolic (congestive) heart failure: Secondary | ICD-10-CM | POA: Diagnosis not present

## 2024-08-27 LAB — BASIC METABOLIC PANEL WITH GFR
Anion gap: 10 (ref 5–15)
BUN: 21 mg/dL (ref 8–23)
CO2: 26 mmol/L (ref 22–32)
Calcium: 8.8 mg/dL — ABNORMAL LOW (ref 8.9–10.3)
Chloride: 103 mmol/L (ref 98–111)
Creatinine, Ser: 0.96 mg/dL (ref 0.44–1.00)
GFR, Estimated: 58 mL/min — ABNORMAL LOW (ref >60)
Glucose, Bld: 92 mg/dL (ref 70–99)
Potassium: 4.1 mmol/L (ref 3.5–5.1)
Sodium: 139 mmol/L (ref 135–145)

## 2024-08-27 LAB — MAGNESIUM: Magnesium: 1.9 mg/dL (ref 1.7–2.4)

## 2024-08-27 MED ORDER — METOPROLOL SUCCINATE ER 100 MG PO TB24
100.0000 mg | ORAL_TABLET | Freq: Every day | ORAL | 0 refills | Status: AC
  Filled 2024-08-27: qty 90, 90d supply, fill #0

## 2024-08-27 MED ORDER — SPIRONOLACTONE 25 MG PO TABS
12.5000 mg | ORAL_TABLET | Freq: Every day | ORAL | 0 refills | Status: AC
  Filled 2024-08-27: qty 45, 90d supply, fill #0

## 2024-08-27 MED ORDER — FUROSEMIDE 40 MG PO TABS
40.0000 mg | ORAL_TABLET | Freq: Two times a day (BID) | ORAL | 0 refills | Status: AC
Start: 2024-08-27 — End: 2024-11-25
  Filled 2024-08-27: qty 180, 90d supply, fill #0

## 2024-08-27 NOTE — Plan of Care (Signed)
  Problem: Education: Goal: Knowledge of General Education information will improve Description: Including pain rating scale, medication(s)/side effects and non-pharmacologic comfort measures Outcome: Progressing   Problem: Clinical Measurements: Goal: Will remain free from infection Outcome: Progressing Goal: Respiratory complications will improve Outcome: Progressing   Problem: Health Behavior/Discharge Planning: Goal: Ability to manage health-related needs will improve Outcome: Not Progressing   Problem: Nutrition: Goal: Adequate nutrition will be maintained Outcome: Not Progressing   Problem: Coping: Goal: Level of anxiety will decrease Outcome: Not Progressing   Problem: Cardiac: Goal: Ability to achieve and maintain adequate cardiopulmonary perfusion will improve Outcome: Not Progressing

## 2024-08-27 NOTE — Discharge Summary (Signed)
 Physician Discharge Summary  Phyllis Knight FMW:[id number] DOB: 03/19/1941 DOA: 08/23/2024  PCP: Claudene Pellet, MD  Admit date: 08/23/2024 Discharge date: 08/27/2024  Admitted From: Home Disposition: Home  Recommendations for Outpatient Follow-up:  Follow up with PCP in 1-2 weeks Furosemide  increased to 40 mg p.o. twice daily for assistance and further mobilization of lower extremity edema Continue to encourage fluid restriction, 1200 mL/day, low-salt diet; suspect dietary noncompliance related to her lower extremity edema Please obtain BMP/CBC in one week  Home Health: No Equipment/Devices: None  Discharge Condition: Stable CODE STATUS: Full code Diet recommendation: Heart healthy/low-salt diet, 1200 mL fluid restriction  History of present illness:  Phyllis Knight is a 83 y.o. female with past medical history significant for chronic systolic congestive heart failure, paroxysmal atrial fibrillation, hyperlipidemia, dementia who presented to [facility] ED on 08/23/2024 with progressive swelling to bilateral lower extremities with serous drainage over the last week. Patient is a poor historian and patient's husband states that she has been compliant with her medications. Denies chest pain but patient states she has been having intermittent episodes of shortness of breath.    Recently hospitalized July 2025 for septic and cardiogenic shock.    In the ED, temperature 98.0 F, HR 87, RR 16, BP 177/112, SpO2 99% room air.  WBC 3.1, hemoglobin 11.8, platelet count 181.  Sodium 141, potassium 4.0, chloride 106, CO2 24, glucose 95, BUN 17, creatinine 0.86.  BNP 3985.0.  High sensitive troponin less than 15 x 2.  Urinalysis unrevealing.  Chest x-ray with mild cardiomegaly.  Vascular duplex ultrasound bilateral lower extremities negative for DVT.  Patient was given furosemide  40 mg IV x 1.  TRH consulted for admission for further evaluation and management of decompensated  CHF.  Hospital course:  Acute on chronic systolic congestive heart failure Patient presenting to the ED with progressive lower extremity edema x 1 week.  Reports compliance with her outpatient medications.  Patient with bilateral lower extremity edema.  Chest x-ray with cardiomegaly, elevated BNP of 3900.  Vascular duplex ultrasound bilateral lower extremities negative for DVT.  TTE 06/17/2024 with LVEF 25-30%, LV with global hypokinesis, RV systolic function moderately reduced, biatrial enlargement, IVC dilated.  Patient was started on IV furosemide  twice daily with improvement of lower extremity edema.  Weight on admission 83.5 kg, improved to 74.1 kg at time of discharge.  Will increase home Lasix  to 40 mg p.o. twice daily.  Continue metoprolol  succinate 100 g p.o. daily, spironolactone  12.5 mg p.o. daily.  1200 mL fluid restriction and low-salt diet.  Outpatient follow-up with PCP 1 week.   Paroxysmal atrial fibrillation Continue metoprolol  succinate 100 mg p.o. daily, Eliquis  5 mg p.o. twice daily   Hyperlipidemia Crestor  20 g p.o. daily   Dementia Continue Seroquel   Discharge Diagnoses:  Principal Problem:   Acute combined systolic and diastolic heart failure (HCC) Active Problems:   Essential hypertension   Paroxysmal A-fib (HCC)   Mixed hyperlipidemia   Dementia without behavioral disturbance (HCC)   Acute CHF (congestive heart failure) (HCC)   Anemia    Discharge Instructions  Discharge Instructions     Call MD for:  difficulty breathing, headache or visual disturbances   Complete by: As directed    Call MD for:  extreme fatigue   Complete by: As directed    Call MD for:  persistant dizziness or light-headedness   Complete by: As directed    Call MD for:  persistant nausea and vomiting   Complete  by: As directed    Call MD for:  severe uncontrolled pain   Complete by: As directed    Call MD for:  temperature >100.4   Complete by: As directed    Diet - low sodium  heart healthy   Complete by: As directed    Discharge wound care:   Complete by: As directed    Cleanse B lower leg wounds with Vashe, do not rinse, allow to air dry. Apply Xeroform gauze to wound beds daily and secure with silicone foam or ABD pads and Kerlix roll gauze beginning right above toes and ending right below knees if legs are still weeping.   Increase activity slowly   Complete by: As directed       Allergies as of 08/27/2024       Reactions   Cefuroxime Rash   Codeine Nausea And Vomiting   Cymbalta [duloxetine Hcl] Other (See Comments)   jumping legs   Zofran  [ondansetron  Hcl] Rash        Medication List     TAKE these medications    acetaminophen  325 MG tablet Commonly known as: TYLENOL  Take 650 mg by mouth every 6 (six) hours as needed for mild pain.   cetirizine 10 MG tablet Commonly known as: ZYRTEC Take 10 mg by mouth daily as needed for rhinitis.   cholecalciferol  1000 units tablet Commonly known as: VITAMIN D  Take 2,000 Units by mouth daily.   clonazePAM  0.5 MG tablet Commonly known as: KLONOPIN  Take 0.5 mg by mouth 2 (two) times daily as needed for anxiety.   Eliquis  5 MG Tabs tablet Generic drug: apixaban  TAKE 1 TABLET BY MOUTH TWICE  DAILY   furosemide  40 MG tablet Commonly known as: Lasix  Take 1 tablet (40 mg total) by mouth 2 (two) times daily. What changed:  when to take this Another medication with the same name was removed. Continue taking this medication, and follow the directions you see here.   metoprolol  succinate 100 MG 24 hr tablet Commonly known as: TOPROL -XL Take 1 tablet (100 mg total) by mouth daily. Take with or immediately following a meal. What changed: Another medication with the same name was removed. Continue taking this medication, and follow the directions you see here.   pantoprazole  40 MG tablet Commonly known as: PROTONIX  Take 1 tablet twice daily for 1 week and then 1 tablet once daily What changed:  how  much to take how to take this when to take this reasons to take this additional instructions   potassium chloride  8 MEQ tablet Commonly known as: KLOR-CON  Take 8 mEq by mouth 2 (two) times daily.   QUEtiapine  50 MG tablet Commonly known as: SEROQUEL  Take 50 mg by mouth at bedtime. What changed: Another medication with the same name was removed. Continue taking this medication, and follow the directions you see here.   rosuvastatin  20 MG tablet Commonly known as: CRESTOR  TAKE 1 TABLET BY MOUTH DAILY   senna-docusate 8.6-50 MG tablet Commonly known as: Senokot-S Take 1 tablet by mouth 2 (two) times daily. What changed:  when to take this reasons to take this   spironolactone  25 MG tablet Commonly known as: ALDACTONE  Take 0.5 tablets (12.5 mg total) by mouth daily.               Discharge Care Instructions  (From admission, onward)           Start     Ordered   08/27/24 0000  Discharge wound care:  Comments: Cleanse B lower leg wounds with Vashe, do not rinse, allow to air dry. Apply Xeroform gauze to wound beds daily and secure with silicone foam or ABD pads and Kerlix roll gauze beginning right above toes and ending right below knees if legs are still weeping.   08/27/24 1044            Follow-up Information     Claudene Pellet, MD. Schedule an appointment as soon as possible for a visit in 1 week(s).   Specialty: Family Medicine Contact information: [phone] [place] [address] [phone]                Allergies  Allergen Reactions   Cefuroxime Rash   Codeine Nausea And Vomiting   Cymbalta [Duloxetine Hcl] Other (See Comments)    jumping legs   Zofran  [Ondansetron  Hcl] Rash    Consultations: None   Procedures/Studies: VAS US  LOWER EXTREMITY VENOUS (DVT) Result Date: 08/24/2024  Lower Venous DVT Study Patient Name:  ATARAH CADOGAN Okubo  Date of Exam:   08/24/2024 Medical Rec #: [id number]         Accession  #:    [id number] Date of Birth: 13-Jun-1941         Patient Gender: F Patient Age:   71 years Exam Location:  [facility] Procedure:      VAS US  LOWER EXTREMITY VENOUS (DVT) Referring Phys: REDIA CLEAVER --------------------------------------------------------------------------------  Indications: Edema.  Risk Factors: Anemia, a-fib. Limitations: Body habitus. Comparison Study: No prior exam. Performing Technologist: Edilia Elden Appl  Examination Guidelines: A complete evaluation includes B-mode imaging, spectral Doppler, color Doppler, and power Doppler as needed of all accessible portions of each vessel. Bilateral testing is considered an integral part of a complete examination. Limited examinations for reoccurring indications may be performed as noted. The reflux portion of the exam is performed with the patient in reverse Trendelenburg.  +---------+---------------+---------+-----------+----------+----------------+ RIGHT    CompressibilityPhasicitySpontaneityPropertiesThrombus Aging   +---------+---------------+---------+-----------+----------+----------------+ CFV      Full           Yes      Yes                                   +---------+---------------+---------+-----------+----------+----------------+ SFJ      Full           Yes      Yes                                   +---------+---------------+---------+-----------+----------+----------------+ FV Prox  Full                                                          +---------+---------------+---------+-----------+----------+----------------+ FV Mid   Full                                                          +---------+---------------+---------+-----------+----------+----------------+ FV DistalFull                                                          +---------+---------------+---------+-----------+----------+----------------+  PFV      Full                                                           +---------+---------------+---------+-----------+----------+----------------+ POP      Full           Yes      Yes                                   +---------+---------------+---------+-----------+----------+----------------+ PTV      Full                                                          +---------+---------------+---------+-----------+----------+----------------+ PERO                                                  Patent by color. +---------+---------------+---------+-----------+----------+----------------+   +---------+---------------+---------+-----------+----------+--------------+ LEFT     CompressibilityPhasicitySpontaneityPropertiesThrombus Aging +---------+---------------+---------+-----------+----------+--------------+ CFV      Full           Yes      Yes                                 +---------+---------------+---------+-----------+----------+--------------+ SFJ      Full           Yes      Yes                                 +---------+---------------+---------+-----------+----------+--------------+ FV Prox  Full                                                        +---------+---------------+---------+-----------+----------+--------------+ FV Mid   Full                                                        +---------+---------------+---------+-----------+----------+--------------+ FV DistalFull                                                        +---------+---------------+---------+-----------+----------+--------------+ PFV      Full                                                        +---------+---------------+---------+-----------+----------+--------------+  POP      Full           Yes      Yes                                 +---------+---------------+---------+-----------+----------+--------------+ PTV      Full                                                         +---------+---------------+---------+-----------+----------+--------------+ PERO     Full                                                        +---------+---------------+---------+-----------+----------+--------------+     Summary: RIGHT: - There is no evidence of deep vein thrombosis in the lower extremity. However, portions of this examination were limited- see technologist comments above.  - No cystic structure found in the popliteal fossa.  LEFT: - There is no evidence of deep vein thrombosis in the lower extremity.  - No cystic structure found in the popliteal fossa.  *See table(s) above for measurements and observations. Electronically signed by Debby Robertson on 08/24/2024 at 5:13:48 PM.    Final    DG Chest 2 View Result Date: 08/24/2024 CLINICAL DATA:  Increasing leg swelling.  Question CHF. EXAM: CHEST - 2 VIEW COMPARISON:  06/25/2024 FINDINGS: Heart is mildly enlarged. Scattered aortic atherosclerosis. No confluent opacities, effusions or edema. No acute bony abnormality. Degenerative changes in the shoulders. IMPRESSION: Mild cardiomegaly.  No evidence of CHF. Electronically Signed   By: Franky Crease M.D.   On: 08/24/2024 01:08     Subjective: Patient seen examined bedside, lying in bed.  Pleasantly confused.  No family present.  Lower extremity edema improved.  Discharging home today.  Discussed with spouse and son extensively at bedside yesterday regarding treatment plan.  Also discussed extensively needs for fluid restriction, low-salt diet as dietary indiscretions likely leading to her lower extremity edema.  Also discussed need for leg elevation.  Patient with no other complaints or concerns at this time.  No acute concerns overnight per nursing staff.  Discharging home today.  Discharge Exam: Vitals:   08/26/24 2057 08/27/24 0505  BP: 110/69 122/80  Pulse: 77 92  Resp: 18 16  Temp: 98 F (36.7 C) 98.1 F (36.7 C)  SpO2: 100% 99%   Vitals:   08/26/24 1354 08/26/24 2057  08/27/24 0500 08/27/24 0505  BP: 129/79 110/69  122/80  Pulse: 73 77  92  Resp: 16 18  16   Temp: 97.7 F (36.5 C) 98 F (36.7 C)  98.1 F (36.7 C)  TempSrc: Oral Oral  Oral  SpO2: 100% 100%  99%  Weight:   74.1 kg   Height:        Physical Exam: GEN: NAD, alert; pleasantly confused  HEENT: NCAT, PERRL, EOMI, sclera clear, MMM PULM: CTAB w/o wheezes/crackles, normal respiratory effort, on room air CV: RRR w/o M/G/R GI: abd soft, NTND, + BS MSK: 1+ bilateral lower extremity edema, moves all extremities independently NEURO: No focal neurological deficit PSYCH: normal mood/affect Integumentary: Wounds noted  to bilateral lower extremities with blisters, serous drainage as depicted below       The results of significant diagnostics from this hospitalization (including imaging, microbiology, ancillary and laboratory) are listed below for reference.     Microbiology: No results found for this or any previous visit (from the past 240 hours).   Labs: BNP (last 3 results) Recent Labs    06/23/24 0451 06/24/24 0445 06/25/24 0559  BNP 639.5* 576.2* 426.1*   Basic Metabolic Panel: Recent Labs  Lab 08/23/24 2323 08/24/24 0650 08/25/24 0419 08/26/24 0404 08/27/24 0341  NA 141 142 142 139 139  K 4.0 3.4* 3.6 4.0 4.1  CL 106 106 106 104 103  CO2 24 25 24 26 26   GLUCOSE 95 86 86 88 92  BUN 17 16 15 15 21   CREATININE 0.86 0.77 0.77 0.81 0.96  CALCIUM  9.4 9.1 8.7* 9.0 8.8*  MG  --   --  1.8 2.0 1.9   Liver Function Tests: Recent Labs  Lab 08/24/24 0650  AST 21  ALT 6  ALKPHOS 74  BILITOT 1.3*  PROT 6.9  ALBUMIN  3.5   No results for input(s): LIPASE, AMYLASE in the last 168 hours. No results for input(s): AMMONIA in the last 168 hours. CBC: Recent Labs  Lab 08/23/24 2323 08/24/24 0650 08/25/24 0419  WBC 3.1* 3.5* 3.3*  NEUTROABS  --  2.0 1.8  HGB 11.8* 10.9* 10.6*  HCT 38.9 34.6* 34.8*  MCV 102.1* 101.5* 100.6*  PLT 181 154 170   Cardiac  Enzymes: No results for input(s): CKTOTAL, CKMB, CKMBINDEX, TROPONINI in the last 168 hours. BNP: Invalid input(s): POCBNP CBG: No results for input(s): GLUCAP in the last 168 hours. D-Dimer No results for input(s): DDIMER in the last 72 hours. Hgb A1c No results for input(s): HGBA1C in the last 72 hours. Lipid Profile No results for input(s): CHOL, HDL, LDLCALC, TRIG, CHOLHDL, LDLDIRECT in the last 72 hours. Thyroid  function studies No results for input(s): TSH, T4TOTAL, T3FREE, THYROIDAB in the last 72 hours.  Invalid input(s): FREET3 Anemia work up No results for input(s): VITAMINB12, FOLATE, FERRITIN, TIBC, IRON, RETICCTPCT in the last 72 hours. Urinalysis    Component Value Date/Time   COLORURINE COLORLESS (A) 08/24/2024 0919   APPEARANCEUR CLEAR 08/24/2024 0919   LABSPEC 1.005 08/24/2024 0919   PHURINE 7.0 08/24/2024 0919   GLUCOSEU NEGATIVE 08/24/2024 0919   HGBUR SMALL (A) 08/24/2024 0919   BILIRUBINUR NEGATIVE 08/24/2024 0919   KETONESUR NEGATIVE 08/24/2024 0919   PROTEINUR NEGATIVE 08/24/2024 0919   UROBILINOGEN 1.0 05/19/2014 2348   NITRITE NEGATIVE 08/24/2024 0919   LEUKOCYTESUR NEGATIVE 08/24/2024 0919   Sepsis Labs Recent Labs  Lab 08/23/24 2323 08/24/24 0650 08/25/24 0419  WBC 3.1* 3.5* 3.3*   Microbiology No results found for this or any previous visit (from the past 240 hours).   Time coordinating discharge: Over 30 minutes  SIGNED:   Camellia PARAS [place], DO  [facility] 08/27/2024, 10:44 AM

## 2024-08-27 NOTE — Plan of Care (Signed)
  Problem: Education: Goal: Knowledge of General Education information will improve Description: Including pain rating scale, medication(s)/side effects and non-pharmacologic comfort measures 08/27/2024 1151 by Rosanne Elspeth HERO, RN Outcome: Adequate for Discharge 08/27/2024 1048 by Rosanne Elspeth HERO, RN Outcome: Progressing   Problem: Health Behavior/Discharge Planning: Goal: Ability to manage health-related needs will improve 08/27/2024 1151 by Rosanne Elspeth HERO, RN Outcome: Adequate for Discharge 08/27/2024 1048 by Rosanne Elspeth HERO, RN Outcome: Not Progressing   Problem: Clinical Measurements: Goal: Ability to maintain clinical measurements within normal limits will improve Outcome: Adequate for Discharge Goal: Will remain free from infection 08/27/2024 1151 by Rosanne Elspeth HERO, RN Outcome: Adequate for Discharge 08/27/2024 1048 by Rosanne Elspeth HERO, RN Outcome: Progressing Goal: Diagnostic test results will improve Outcome: Adequate for Discharge Goal: Respiratory complications will improve 08/27/2024 1151 by Rosanne Elspeth HERO, RN Outcome: Adequate for Discharge 08/27/2024 1048 by Rosanne Elspeth HERO, RN Outcome: Progressing Goal: Cardiovascular complication will be avoided Outcome: Adequate for Discharge   Problem: Activity: Goal: Risk for activity intolerance will decrease Outcome: Adequate for Discharge   Problem: Nutrition: Goal: Adequate nutrition will be maintained 08/27/2024 1151 by Rosanne Elspeth HERO, RN Outcome: Adequate for Discharge 08/27/2024 1048 by Rosanne Elspeth HERO, RN Outcome: Not Progressing   Problem: Coping: Goal: Level of anxiety will decrease 08/27/2024 1151 by Rosanne Elspeth HERO, RN Outcome: Adequate for Discharge 08/27/2024 1048 by Rosanne Elspeth HERO, RN Outcome: Not Progressing   Problem: Elimination: Goal: Will not experience complications related to bowel motility Outcome: Adequate for Discharge Goal: Will not  experience complications related to urinary retention Outcome: Adequate for Discharge   Problem: Pain Managment: Goal: General experience of comfort will improve and/or be controlled Outcome: Adequate for Discharge   Problem: Safety: Goal: Ability to remain free from injury will improve Outcome: Adequate for Discharge   Problem: Skin Integrity: Goal: Risk for impaired skin integrity will decrease Outcome: Adequate for Discharge   Problem: Cardiac: Goal: Ability to achieve and maintain adequate cardiopulmonary perfusion will improve 08/27/2024 1151 by Rosanne Elspeth HERO, RN Outcome: Adequate for Discharge 08/27/2024 1048 by Rosanne Elspeth HERO, RN Outcome: Not Progressing
